# Patient Record
Sex: Male | Born: 1935 | Race: White | Hispanic: No | Smoking: Never smoker
Health system: Southern US, Community
[De-identification: ages and names within clinical notes are randomized; demographics above are authoritative.]

## PROBLEM LIST (undated history)

## (undated) DIAGNOSIS — J189 Pneumonia, unspecified organism: Secondary | ICD-10-CM

## (undated) DIAGNOSIS — I4891 Unspecified atrial fibrillation: Secondary | ICD-10-CM

## (undated) DIAGNOSIS — C61 Malignant neoplasm of prostate: Secondary | ICD-10-CM

## (undated) DIAGNOSIS — I8392 Asymptomatic varicose veins of left lower extremity: Secondary | ICD-10-CM

## (undated) DIAGNOSIS — I499 Cardiac arrhythmia, unspecified: Secondary | ICD-10-CM

## (undated) DIAGNOSIS — R0600 Dyspnea, unspecified: Secondary | ICD-10-CM

## (undated) DIAGNOSIS — R32 Unspecified urinary incontinence: Secondary | ICD-10-CM

## (undated) DIAGNOSIS — E291 Testicular hypofunction: Secondary | ICD-10-CM

## (undated) DIAGNOSIS — I509 Heart failure, unspecified: Secondary | ICD-10-CM

## (undated) DIAGNOSIS — A419 Sepsis, unspecified organism: Secondary | ICD-10-CM

## (undated) DIAGNOSIS — L0390 Cellulitis, unspecified: Secondary | ICD-10-CM

## (undated) DIAGNOSIS — M1990 Unspecified osteoarthritis, unspecified site: Secondary | ICD-10-CM

## (undated) DIAGNOSIS — K219 Gastro-esophageal reflux disease without esophagitis: Secondary | ICD-10-CM

## (undated) DIAGNOSIS — Z87442 Personal history of urinary calculi: Secondary | ICD-10-CM

## (undated) HISTORY — PX: COLONOSCOPY: SHX174

## (undated) HISTORY — DX: Unspecified atrial fibrillation: I48.91

## (undated) HISTORY — PX: KYPHOPLASTY: SHX5884

## (undated) HISTORY — DX: Malignant neoplasm of prostate: C61

## (undated) HISTORY — PX: LAMINECTOMY: SHX219

## (undated) HISTORY — PX: HERNIA REPAIR: SHX51

## (undated) HISTORY — PX: OTHER SURGICAL HISTORY: SHX169

## (undated) HISTORY — PX: TOTAL KNEE ARTHROPLASTY: SHX125

## (undated) HISTORY — PX: ROTATOR CUFF REPAIR: SHX139

## (undated) HISTORY — DX: Unspecified osteoarthritis, unspecified site: M19.90

## (undated) HISTORY — DX: Unspecified urinary incontinence: R32

## (undated) HISTORY — PX: PROSTATE SURGERY: SHX751

## (undated) HISTORY — PX: APPENDECTOMY: SHX54

---

## 1998-06-19 ENCOUNTER — Encounter: Payer: Self-pay | Admitting: Orthopedic Surgery

## 1998-06-26 ENCOUNTER — Inpatient Hospital Stay (HOSPITAL_COMMUNITY): Admission: RE | Admit: 1998-06-26 | Discharge: 1998-06-30 | Payer: Self-pay | Admitting: Orthopedic Surgery

## 1999-07-31 ENCOUNTER — Encounter: Payer: Self-pay | Admitting: Orthopedic Surgery

## 1999-08-01 ENCOUNTER — Observation Stay (HOSPITAL_COMMUNITY): Admission: RE | Admit: 1999-08-01 | Discharge: 1999-08-02 | Payer: Self-pay | Admitting: Orthopedic Surgery

## 2002-02-03 ENCOUNTER — Encounter: Payer: Self-pay | Admitting: Orthopedic Surgery

## 2002-02-03 ENCOUNTER — Ambulatory Visit (HOSPITAL_COMMUNITY): Admission: RE | Admit: 2002-02-03 | Discharge: 2002-02-03 | Payer: Self-pay | Admitting: Radiology

## 2002-02-03 ENCOUNTER — Encounter: Admission: RE | Admit: 2002-02-03 | Discharge: 2002-02-03 | Payer: Self-pay | Admitting: Orthopedic Surgery

## 2002-02-12 ENCOUNTER — Ambulatory Visit (HOSPITAL_COMMUNITY): Admission: RE | Admit: 2002-02-12 | Discharge: 2002-02-12 | Payer: Self-pay | Admitting: Radiology

## 2002-02-12 ENCOUNTER — Encounter: Admission: RE | Admit: 2002-02-12 | Discharge: 2002-02-12 | Payer: Self-pay | Admitting: Radiology

## 2002-02-12 ENCOUNTER — Encounter: Payer: Self-pay | Admitting: Radiology

## 2002-02-24 ENCOUNTER — Ambulatory Visit (HOSPITAL_COMMUNITY): Admission: RE | Admit: 2002-02-24 | Discharge: 2002-02-24 | Payer: Self-pay | Admitting: Radiology

## 2002-02-24 ENCOUNTER — Encounter: Admission: RE | Admit: 2002-02-24 | Discharge: 2002-02-24 | Payer: Self-pay | Admitting: Family Medicine

## 2002-02-24 ENCOUNTER — Encounter: Payer: Self-pay | Admitting: Family Medicine

## 2002-03-11 ENCOUNTER — Inpatient Hospital Stay (HOSPITAL_COMMUNITY): Admission: RE | Admit: 2002-03-11 | Discharge: 2002-03-12 | Payer: Self-pay | Admitting: Neurosurgery

## 2002-03-11 ENCOUNTER — Encounter: Payer: Self-pay | Admitting: Neurosurgery

## 2003-05-09 ENCOUNTER — Ambulatory Visit (HOSPITAL_COMMUNITY): Admission: RE | Admit: 2003-05-09 | Discharge: 2003-05-09 | Payer: Self-pay | Admitting: Orthopedic Surgery

## 2003-05-09 ENCOUNTER — Encounter: Payer: Self-pay | Admitting: Orthopedic Surgery

## 2011-05-24 DIAGNOSIS — E291 Testicular hypofunction: Secondary | ICD-10-CM

## 2011-05-24 HISTORY — DX: Testicular hypofunction: E29.1

## 2011-06-07 ENCOUNTER — Other Ambulatory Visit (INDEPENDENT_AMBULATORY_CARE_PROVIDER_SITE_OTHER): Payer: BC Managed Care – PPO

## 2011-06-07 ENCOUNTER — Ambulatory Visit (INDEPENDENT_AMBULATORY_CARE_PROVIDER_SITE_OTHER)
Admission: RE | Admit: 2011-06-07 | Discharge: 2011-06-07 | Disposition: A | Discharge status: Home / Self Care | Payer: BC Managed Care – PPO | Source: Ambulatory Visit | Attending: Internal Medicine | Admitting: Internal Medicine

## 2011-06-07 ENCOUNTER — Encounter: Payer: Self-pay | Admitting: Internal Medicine

## 2011-06-07 ENCOUNTER — Ambulatory Visit (INDEPENDENT_AMBULATORY_CARE_PROVIDER_SITE_OTHER): Payer: BC Managed Care – PPO | Admitting: Internal Medicine

## 2011-06-07 DIAGNOSIS — Z79899 Other long term (current) drug therapy: Secondary | ICD-10-CM

## 2011-06-07 DIAGNOSIS — R0609 Other forms of dyspnea: Secondary | ICD-10-CM

## 2011-06-07 DIAGNOSIS — R5383 Other fatigue: Secondary | ICD-10-CM

## 2011-06-07 DIAGNOSIS — R0600 Dyspnea, unspecified: Secondary | ICD-10-CM

## 2011-06-07 DIAGNOSIS — H02403 Unspecified ptosis of bilateral eyelids: Secondary | ICD-10-CM

## 2011-06-07 DIAGNOSIS — H02409 Unspecified ptosis of unspecified eyelid: Secondary | ICD-10-CM

## 2011-06-07 DIAGNOSIS — R531 Weakness: Secondary | ICD-10-CM

## 2011-06-07 DIAGNOSIS — R471 Dysarthria and anarthria: Secondary | ICD-10-CM

## 2011-06-07 DIAGNOSIS — R0989 Other specified symptoms and signs involving the circulatory and respiratory systems: Secondary | ICD-10-CM

## 2011-06-07 DIAGNOSIS — I482 Chronic atrial fibrillation, unspecified: Secondary | ICD-10-CM | POA: Insufficient documentation

## 2011-06-07 DIAGNOSIS — R5381 Other malaise: Secondary | ICD-10-CM

## 2011-06-07 LAB — BASIC METABOLIC PANEL
BUN: 19 mg/dL (ref 6–23)
CO2: 25 mEq/L (ref 19–32)
Calcium: 9.1 mg/dL (ref 8.4–10.5)
Chloride: 109 mEq/L (ref 96–112)
Creatinine, Ser: 1.2 mg/dL (ref 0.4–1.5)
Glucose, Bld: 97 mg/dL (ref 70–99)

## 2011-06-07 LAB — HEPATIC FUNCTION PANEL
Albumin: 4 g/dL (ref 3.5–5.2)
Alkaline Phosphatase: 54 U/L (ref 39–117)
Bilirubin, Direct: 0.1 mg/dL (ref 0.0–0.3)
Total Protein: 6.9 g/dL (ref 6.0–8.3)

## 2011-06-07 LAB — CBC WITH DIFFERENTIAL/PLATELET
Basophils Absolute: 0 10*3/uL (ref 0.0–0.1)
Basophils Relative: 0.6 % (ref 0.0–3.0)
Eosinophils Absolute: 0.1 10*3/uL (ref 0.0–0.7)
Lymphocytes Relative: 18.4 % (ref 12.0–46.0)
MCHC: 34.3 g/dL (ref 30.0–36.0)
MCV: 98.7 fl (ref 78.0–100.0)
Monocytes Absolute: 0.5 10*3/uL (ref 0.1–1.0)
Neutrophils Relative %: 72.3 % (ref 43.0–77.0)
RBC: 3.99 Mil/uL — ABNORMAL LOW (ref 4.22–5.81)
RDW: 13.6 % (ref 11.5–14.6)

## 2011-06-07 LAB — TSH: TSH: 1.42 u[IU]/mL (ref 0.35–5.50)

## 2011-06-07 LAB — PROTIME-INR: INR: 2.1 ratio — ABNORMAL HIGH (ref 0.8–1.0)

## 2011-06-07 NOTE — Progress Notes (Signed)
  Subjective:    Patient ID: Richard Bradley, male    DOB: [birth year], 77 y.o.   MRN: [id number]  HPI  New patient to me and our practice here today to establish care  Complains of drooping eyelids and double vision Onset 2 weeks ago Family reports generalized weakness and fatigability for the last 12 months, accelerated progression in the last 2 months Symptoms worsened today, symptoms improved with rest and sleep Associated with dysarthria and jumbled speech, worse when tired On chronic Coumadin but denies any falls or recent head trauma No unilateral weakness No prior history of stroke Intermittent dyspnea over past 12 months status post thorough cardiac evaluation and hometown [place] negative cardiac catheterization in December 2011 reviewed Symptoms seem worse when taking Megace, stopped same 3 days ago  Past Medical History  Diagnosis Date  . Kidney stones   . Urine incontinence   . Prostate cancer   . Atrial fibrillation   . Osteoarthritis      Review of Systems  Constitutional: Negative for fever and unexpected weight change.  HENT: Positive for hearing loss. Negative for drooling and tinnitus.   Respiratory: Negative for cough and chest tightness.   Cardiovascular: Negative for chest pain and leg swelling.  Neurological: Negative for dizziness and numbness.  Psychiatric/Behavioral: Positive for decreased concentration. Negative for behavioral problems and confusion. The patient is not nervous/anxious.        Objective:   Physical Exam BP 110/72  Pulse 86  Temp(Src) 97.6 F (36.4 C) (Oral)  Ht 6\' 3"  (1.905 m)  Wt 192 lb (87.091 kg)  BMI 24.00 kg/m2  SpO2 97% Constitutional:  He appears well-developed and well-nourished. No distress. Dtr at side. Eyes: mild-mod R>L upper lid ptosis, EOMI but slow tracking. PERRL Vision grossly intact. No conjunctivitis or icterus. Neck: Normal range of motion. Neck supple. No JVD present. No thyromegaly  present.  Cardiovascular: Normal rate, irregular rhythm and normal heart sounds.  No murmur heard. no BLE edema Pulmonary/Chest: Effort normal and breath sounds normal. No respiratory distress. no wheezes.  Abdominal: Soft. Bowel sounds are normal. Patient exhibits no distension. There is no tenderness.  Musculoskeletal: Normal range of motion. Patient exhibits no edema.  Neurological: he is alert and oriented to person, place, and time. No cranial nerve deficit. Coordination normal.  normal reflexes upper and lower extremities, normal strength upper and lower extremities. Skin: Skin is warm and dry.  No erythema or ulceration.  Psychiatric: he has a normal mood and affect. behavior is normal. Judgment and thought content normal.   No results found for this basename: WBC, HGB, HCT, PLT, GLUCOSE, CHOL, TRIG, HDL, LDLDIRECT, LDLCALC, ALT, AST, NA, K, CL, CREATININE, BUN, CO2, TSH, PSA, INR, GLUF, HGBA1C, MICROALBUR       Assessment & Plan:  Ptosis of eyelids, worse end of day Dyspnea, intermittent Generalized weakness Dysarthria, worse end of the day Chronic A. fib on anticoagulation Prostate cancer hx on Megace  Clinically concerned for chronic subdural or myasthenia gravis Check labs including acetylcholine receptor antibody today Check head CT without contrast today>>> addendum: Report reviewed, no subdural. No intracranial bleed. Small vessel changes, no mass. Consider referral to neuro as needed  Hold Megace

## 2011-06-07 NOTE — Patient Instructions (Signed)
It was good to see you today. Continue to hold on megace for now Test(s) ordered today. Your results will be called to you after review (48-72hours after test completion). If any changes need to be made, you will be notified at that time. we'll make referral for scan of your head today. Our office will contact you regarding appointment(s) once made.

## 2011-06-09 LAB — ACETYLCHOLINE RECEPTOR, BINDING: A CHR BINDING ABS: <0.3 nmol/L (ref <=0.30)

## 2011-06-11 ENCOUNTER — Other Ambulatory Visit: Payer: Self-pay | Admitting: General Practice

## 2011-06-13 ENCOUNTER — Encounter: Payer: Self-pay | Admitting: Neurology

## 2011-06-13 ENCOUNTER — Ambulatory Visit (INDEPENDENT_AMBULATORY_CARE_PROVIDER_SITE_OTHER): Payer: BC Managed Care – PPO | Admitting: Neurology

## 2011-06-13 ENCOUNTER — Other Ambulatory Visit: Payer: BC Managed Care – PPO

## 2011-06-13 DIAGNOSIS — G7000 Myasthenia gravis without (acute) exacerbation: Secondary | ICD-10-CM

## 2011-06-13 MED ORDER — PYRIDOSTIGMINE BROMIDE 60 MG PO TABS: ORAL_TABLET | ORAL | Status: DC

## 2011-06-13 NOTE — Progress Notes (Signed)
Dear Dr. Felicity Coyer,  Thank you for having me see Richard Bradley in consultation today at [facility] for his problem with droopy eye lids, blurry vision, and dysarthria.  As you may recall, he is a 77 y.o. year old male with a history of prostate cancer and atrial fibrillation who presents with a year history of fluctuating eye lid drooping, difficulty breathing and recent onset of double vision.  He notes it improves when he lies down but gets worse near the end of the day.  He is noted to have dysarthria in the evening.  He denies appendicular weakness.  His breathing is not necessarily worse lying down.  He has noticed some urinary incontinence, only when standing.  Past Medical History  Diagnosis Date  . Kidney stones   . Urine incontinence   . Prostate cancer   . Atrial fibrillation   . Osteoarthritis     Past Surgical History  Procedure Date  . Prostate surgery   . Hernia repair   . Total knee arthroplasty   . Rotator cuff repair     History   Social History  . Marital Status: Married    Spouse Name: N/A    Number of Children: N/A  . Years of Education: N/A   Social History Main Topics  . Smoking status: Never Smoker   . Smokeless tobacco: Never Used  . Alcohol Use: No  . Drug Use: No  . Sexually Active: None   Other Topics Concern  . None   Social History Narrative  . None    Family History:  No history of neurologic disorders   ROS:  13 systems were reviewed and are notable for urinary incontinence when standing.  All other review of systems are unremarkable.   Examination:  Filed Vitals:   06/13/11 1120  BP: 108/78  Pulse: 92  Weight: 193 lb (87.544 kg)     In general, well appearing older man.  Cardiovascular: The patient has a irregularly irregular rhythm and no carotid bruits.  Respiratory:  can count to 18 on one breath which is impaired.  Fundoscopy:  Disks are flat. Vessel caliber within normal limits.  Mental status:   The  patient is oriented to person, place and time. Recent and remote memory are intact. Attention span and concentration are normal. Language including repetition, naming, following commands are intact. Fund of knowledge of current and historical events, as well as vocabulary are normal.  Cranial Nerves: Pupils are equally round and reactive to light. Visual fields full to confrontation. EOMs appear full at this time, with out dysconjugacy, and no double vsion.  Obvious ptosis, worse on right.  Better with application of ice pack for 1 minute. Facial sensation and muscles of mastication are intact. Muscles of facial expression are symmetric.  There may be some weakness of the tongue on deviation to the right. Hearing intact to bilateral finger rub. Tongue protrusion, uvula, palate midline.  Shoulder shrug intact.  Motor:  Some fatiguable weakness at the shoulders bilaterally.  I would rate his strength otherwise 5-.  Reflexes:   Biceps  Triceps Brachioradialis Knee Ankle  Right 2+  2+  2+   2+ 0  Left  2+  2+  2+   2+ 0  Toes down  Coordination:  Normal finger to nose.    Sensation is decreased to temperature in the feet.  Gait and Station are normal.   Romberg is negative  AchR Binding Ab was negative as ordered by  Dr. Felicity Coyer.  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  I will get an EMG/NCS with RNS, and a CT of the chest for a thymoma.  I am going to send of AchR modulating ab as well as MUSK ab.  However, I would not doubt these are negative.  I have instructed the patient to start pyridostigmine 30mg  tid to increase to 60mg  tid.  He is to stop this two days before his EMG/NCS.  He may need steroids or CellCept if the pyridostigmine does not help.    We will see the patient back in 2 weeks.  Thank you for having [place] see Richard Gitelman Evilsizer in consultation.  Feel free to contact me with any questions.  Lupita Raider Modesto Charon, MD [facility], [place] [address] [place], [place] [address] Phone: [phone] Fax: [phone].

## 2011-06-13 NOTE — Patient Instructions (Addendum)
Go to the basement to have your labs drawn today.  Your CT scan is scheduled for Tuesday, November 20tha t 2:00pm.  Please arrive to [facility] by 1:45pm.  Nothing to eat or drink after 10:00am except for water.  Your appointment for the nerve conduction studies/electromyelogram is scheduled for Monday, December 3rd at 8:30am at  [facility] [address] [place] [phone].  Your follow up with Dr. Modesto Charon is Thursday, November 29th at 11:30am.

## 2011-06-18 ENCOUNTER — Ambulatory Visit (HOSPITAL_COMMUNITY)
Admission: RE | Admit: 2011-06-18 | Discharge: 2011-06-18 | Disposition: A | Discharge status: Home / Self Care | Payer: BC Managed Care – PPO | Source: Ambulatory Visit | Attending: Neurology | Admitting: Neurology

## 2011-06-18 ENCOUNTER — Encounter (HOSPITAL_COMMUNITY): Payer: Self-pay

## 2011-06-18 DIAGNOSIS — I700 Atherosclerosis of aorta: Secondary | ICD-10-CM | POA: Insufficient documentation

## 2011-06-18 DIAGNOSIS — K7689 Other specified diseases of liver: Secondary | ICD-10-CM | POA: Insufficient documentation

## 2011-06-18 DIAGNOSIS — G7000 Myasthenia gravis without (acute) exacerbation: Secondary | ICD-10-CM | POA: Insufficient documentation

## 2011-06-18 DIAGNOSIS — I2510 Atherosclerotic heart disease of native coronary artery without angina pectoris: Secondary | ICD-10-CM | POA: Insufficient documentation

## 2011-06-18 DIAGNOSIS — I319 Disease of pericardium, unspecified: Secondary | ICD-10-CM | POA: Insufficient documentation

## 2011-06-18 DIAGNOSIS — N200 Calculus of kidney: Secondary | ICD-10-CM | POA: Insufficient documentation

## 2011-06-18 DIAGNOSIS — C61 Malignant neoplasm of prostate: Secondary | ICD-10-CM | POA: Insufficient documentation

## 2011-06-18 MED ORDER — IOHEXOL 300 MG/ML  SOLN
80.0000 mL | Freq: Once | INTRAMUSCULAR | Status: AC | PRN
  Administered 2011-06-18: 80 mL via INTRAVENOUS

## 2011-06-19 ENCOUNTER — Ambulatory Visit: Payer: BC Managed Care – PPO | Admitting: Neurology

## 2011-06-27 ENCOUNTER — Encounter: Payer: Self-pay | Admitting: Neurology

## 2011-06-27 ENCOUNTER — Ambulatory Visit (INDEPENDENT_AMBULATORY_CARE_PROVIDER_SITE_OTHER): Payer: BC Managed Care – PPO | Admitting: Neurology

## 2011-06-27 DIAGNOSIS — G7000 Myasthenia gravis without (acute) exacerbation: Secondary | ICD-10-CM | POA: Insufficient documentation

## 2011-06-27 MED ORDER — PYRIDOSTIGMINE BROMIDE 60 MG PO TABS: ORAL_TABLET | ORAL | Status: DC

## 2011-06-27 NOTE — Patient Instructions (Signed)
Your prescription has been sent to [facility].  We will see you back on January 2nd at 11:30am.  Have a great Christmas and New Year!!

## 2011-06-27 NOTE — Progress Notes (Signed)
Dear Dr. Felicity Coyer,  I saw  Richard Bradley back in [facility] for his problem with ocularbulbar myasthenia gravis.  As you may recall, he is a 77 y.o. year old male with a history of atrial fibrillation and prostate cancer who presents with a year history of ptosis, diplopia, dysarthria and difficulty breathing.  You felt he had myasthenia gravis and clinically I agreed.  I ordered an AchR modulating ab as his binding ab was negative.  It came back positive confirming the diagnosis.    I started him on Mestinon 60 tid.  He feels it has helped the diplopia somewhat but he still is having problems with ptosis, dysarthria in the evenings and shortness of breath.   He denies appendicular weakness.  He does have some excessive salivation, but it is hard to tell if this is secondary to the Mestinon in terms of the timing.  He thinks it got worse when he stopped his PPI.   Medical history, social history, and family history were reviewed and have not changed since the last clinic visit.  Current Outpatient Prescriptions on File Prior to Visit  Medication Sig Dispense Refill  . metoprolol (LOPRESSOR) 50 MG tablet Take 25 mg by mouth 3 (three) times daily.        Marland Kitchen omeprazole (PRILOSEC) 20 MG capsule Take 20 mg by mouth daily.        Marland Kitchen warfarin (COUMADIN) 5 MG tablet Take 5 mg by mouth as directed.        . megestrol (MEGACE) 20 MG tablet Take 20 mg by mouth 2 (two) times daily.          No Known Allergies  ROS:  13 systems were reviewed and are notable for excessive salivation, no diarrhea, no abdo pain.  All other review of systems are unremarkable.  Exam: . Filed Vitals:   06/27/11 1116  BP: 118/78  Pulse: 82  Weight: 153 lb 14.4 oz (69.809 kg)    Cranial Nerves: Clear fatiguable ptosis.  Fatiguable disconjugate gaze.  Mild facial and tongue weakness.  Neck flexion, neck ext normal.    Motor:  No appendicular weakness.  CT Chest: no thymoma.   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    Recommendations: I am going to increase his Mestinon 60 tid to 120 tid.  If he does not get appropriate results from this I will start him on 5mg  daily of prednisone to increase 5 qweek to 30.  I will see him back in 4 weeks.  Lupita Raider Modesto Charon, MD [facility], [place]

## 2011-07-11 ENCOUNTER — Telehealth: Payer: Self-pay | Admitting: Neurology

## 2011-07-11 ENCOUNTER — Other Ambulatory Visit: Payer: Self-pay | Admitting: Neurology

## 2011-07-11 MED ORDER — PREDNISONE 5 MG PO TABS: ORAL_TABLET | ORAL | Status: DC

## 2011-07-11 NOTE — Telephone Encounter (Signed)
Called in the med to [facility]. Spoke with Dennie Bible. Called in as directed below. "Jan - could you please call in 5mg  prednisone tabs dispense 180 tabs to the [facility].  The sig should be "start with 5mg  daily, increase every week by 5mg  a day, to 30mg  a day after 6 weeks"  3 refills  I have talked to daughter with instructions."

## 2011-07-11 NOTE — Telephone Encounter (Signed)
Pt is done with titration schedule of Mestinon but isn't feeling much better. Pt's daughter states that you discussed starting Prednisone if Mestinon isn't working so he would like to try this. When he starts the Pred, should he continue taking the other med (and how much) or does he stop the other med altogether? Pt is also back at home now so the meds should be sent in to the [facility], fax number [phone]. Pt's daughter wasn't sure if this pharmacy accepted e-prescribe.

## 2011-07-11 NOTE — Telephone Encounter (Signed)
Jan - could you please call in 5mg  prednisone tabs dispense 180 tabs to the [facility].  The sig should be "start with 5mg  daily, increase every week by 5mg  a day, to 30mg  a day after 6 weeks"  3 refills  I have talked to daughter with instructions.

## 2011-07-12 ENCOUNTER — Encounter: Payer: Self-pay | Admitting: Neurology

## 2011-07-16 ENCOUNTER — Telehealth: Payer: Self-pay | Admitting: Neurology

## 2011-07-16 NOTE — Telephone Encounter (Signed)
Patient left a voicemail message. "I'm having trouble with my vision. Every morning when I get up my vision is worse. It was so bad this morning that I had to cancel an appointment. I am concerned and need to know if I need to back off of some of my medicines? I started the Prednisone on Friday. I just need some direction. If I should do nothing then I need to know that too. My number is [phone]. I look forward to talking to you soon."

## 2011-07-16 NOTE — Telephone Encounter (Signed)
spoke to patient.  Having transient worsening of vision(ptosis) on prednisone.  No other worsening of swallowing, breathing, etc.  I told him to increase the prednisone to 10mg  as scheduled and let me know if he continues to worsen.  If he develops breathing or speaking problems we may have to bring him into hospital.

## 2011-07-16 NOTE — Telephone Encounter (Signed)
Called and spoke with the patient. He states that his vision is getting worse every day and he is concerned. He is taking the Prednisone 5 mg daily (started last Friday) and will increase to 10 mg this coming Friday. He continues the Mestinon. He had to cancel appointments yesterday for today due to his vision. States he can still read the paper but is difficult. He is also anxious about driving. I asked that he not put himself in harm's way by driving. He states he won't. The patient is concerned and just wants to know what to do now. I told him I would let Dr. Modesto Charon know that he was having problems. **Dr. Modesto Charon please advise. Thank you.

## 2011-07-31 ENCOUNTER — Ambulatory Visit (INDEPENDENT_AMBULATORY_CARE_PROVIDER_SITE_OTHER): Payer: BC Managed Care – PPO | Admitting: Neurology

## 2011-07-31 ENCOUNTER — Encounter: Payer: Self-pay | Admitting: Neurology

## 2011-07-31 DIAGNOSIS — G7000 Myasthenia gravis without (acute) exacerbation: Secondary | ICD-10-CM

## 2011-07-31 NOTE — Progress Notes (Signed)
Dear Dr. Felicity Bradley,  I saw  Richard Bradley back in [name] clinic for his problem with sero-positive ocularbulbar myasthenia gravis.  As you may recall, he is a 78 y.o. year old male with a history of atrial fibrillation and prostate cancer who presented with a year history of ptosis, diplopia and difficulty breathing.    His AchR mod ab was positive, binding ab was negative.  I started him initially on mestinon and increased it to 120 tid.  He got some results but did not feel an adequate improvement.  I then started him on a slowly increasing dose of prednisone at a dose increase of 5mg  per week.  He is currently on 15mg  a day.  My target is 30mg  daily.  He thinks that he has had worsening of his condition, with worse difficulty breathing and difficulty seeing and chewing.  His ptosis interestingly has gotten better though.  He says he has had two episodes where he can't move his hands.   Medical history, social history, and family history were reviewed and have not changed since the last clinic visit.  Current Outpatient Prescriptions on File Prior to Visit  Medication Sig Dispense Refill  . megestrol (MEGACE) 20 MG tablet Take 20 mg by mouth 2 (two) times daily.       . metoprolol (LOPRESSOR) 50 MG tablet Take 25 mg by mouth 3 (three) times daily.       Marland Kitchen omeprazole (PRILOSEC) 20 MG capsule Take 20 mg by mouth daily.       . predniSONE (DELTASONE) 5 MG tablet Maintenance dose after titration will be 30 mg per day.  180 tablet  3  . pyridostigmine (MESTINON) 60 MG tablet start with 1 1/2 three times a day for 1 week, then 2 three times a day from then on.  180 tablet  11  . warfarin (COUMADIN) 5 MG tablet Take 5 mg by mouth as directed.          No Known Allergies  ROS:  13 systems were reviewed and are notable for blurriness of vision.  All other review of systems are unremarkable.  Exam: . Filed Vitals:   07/31/11 1145  BP: 108/68  Pulse: 76  Weight: 189 lb (85.73 kg)     In general, well appearing older man.  Cranial Nerves: Pupils are equally round and reactive to light.  Extraocular movements reveal fatiguable upgaze.  He has a right worse than left fatiguable ptosis.    Strong eye closure.  Cannot puff out cheeks.  Good palatal elevation. Good neck flexion and neck extension strength.  SS strong.  Counts to 20 on one breath.  Motor:  ?mild appendicular 5- weakness proximally.  Reflexes:  2+ thoughout, toes down.  Coordination:  Normal finger to nose  Gait:  Normal gait and station.  Romberg negative.  Impression/Recs:  Sero-positive ocularbulbar myasthenia, stable.  I am of course worried about his reports of worsening.  I wonder if this is because of the initial exacerbation that one can see with steroids.  I have offered him the option of PLEX to see if we can jump start his progress.  I do think he is stable enough from a respiratory standpoint to be at home, however if he worsens I would PLEX him emergently.  For now we can set it up as a elective admission.  He will call me when he is ready to be admitted.   Richard Raider Modesto Charon, MD [facility], [place]

## 2011-07-31 NOTE — Patient Instructions (Signed)
We will see you back on Friday,  Feb 15th at 10:00am.

## 2011-08-01 ENCOUNTER — Other Ambulatory Visit: Payer: Self-pay | Admitting: Neurology

## 2011-08-01 MED ORDER — PYRIDOSTIGMINE BROMIDE 60 MG PO TABS: ORAL_TABLET | ORAL | Status: DC

## 2011-08-01 NOTE — Telephone Encounter (Signed)
Pt would like [place] to fax his rx for mestinon to [facility] at fax # [phone] ASAP.

## 2011-08-01 NOTE — Telephone Encounter (Signed)
Called and faxed to [facility] pharmacy by Jan.

## 2011-08-06 ENCOUNTER — Telehealth: Payer: Self-pay | Admitting: Neurology

## 2011-08-06 NOTE — Telephone Encounter (Signed)
Pt is still in hospital on IVIG. They decreased his prednisone to 15 mg and plan to step it down further. Pt is still on O2. Daughter anticipates pt will be discharged later this week. Should they keep their regular fu appt with you on 09/13/2011 or do we need to work them in some time next week?

## 2011-08-06 NOTE — Telephone Encounter (Signed)
yes we need to fit him in next week.  say noon Friday.

## 2011-08-07 NOTE — Telephone Encounter (Signed)
Pt scheduled for Friday 08/16/2011 at noon.

## 2011-08-09 ENCOUNTER — Telehealth: Payer: Self-pay | Admitting: Neurology

## 2011-08-09 NOTE — Telephone Encounter (Signed)
Pt has fu appt scheduled 08/16/2011. Pt was previously advised to taper down medication from 20 mg to 15 mg, but upon discharge from hospital in [place], physician reccommended he return to 20 mg. Pt's daughter wanted to make sure that this is what you suggest as well.

## 2011-08-09 NOTE — Telephone Encounter (Signed)
spoke to daughter.  hold the pred at 65 for now.

## 2011-08-16 ENCOUNTER — Encounter: Payer: Self-pay | Admitting: Neurology

## 2011-08-16 ENCOUNTER — Ambulatory Visit (INDEPENDENT_AMBULATORY_CARE_PROVIDER_SITE_OTHER): Payer: BC Managed Care – PPO | Admitting: Neurology

## 2011-08-16 DIAGNOSIS — G7000 Myasthenia gravis without (acute) exacerbation: Secondary | ICD-10-CM

## 2011-08-16 NOTE — Progress Notes (Signed)
Dear Dr. Felicity Coyer,  Thank you for having me see Richard Bradley in consultation today at [facility] for his problem with seropositive myasthenia gravis.  As you may recall, he is a 78 y.o. year old male with a years difficulty with ptosis, double vision, difficulty speaking and swallowing as well as breathing who you diagnosed with myasthenia gravis and we confirmed with AchR modulating ab positivity.  I placed him on prednisone, but as I feared he had interval worsening of his illness so that when I saw him last two weeks ago he was having mild difficulty breathing.  He also had an URTI.  I elected to continue to follow him as an outpatient, but due to worsening of his breathing he presented to the medical center in [place].  They felt he had an MG exacerbation and gave him five days of IVIG.  They also treated him for a bronchitis.  They also backed off his prednisone from 20 to 15 mg a day.  Since discharge he has been doing much better.  He had three days of feeling "almost perfect" although his diplopia and ptosis as well as chewing is worse today as he didn't sleep well at night.  He denies appendicular weakness.  He also continues on Mestinon 120 tid.  Past Medical History  Diagnosis Date  . Kidney stones   . Urine incontinence   . Atrial fibrillation   . Osteoarthritis   . Prostate cancer dx'd 1982  . Myasthenia gravis     Past Surgical History  Procedure Date  . Prostate surgery   . Hernia repair   . Total knee arthroplasty   . Rotator cuff repair     History   Social History  . Marital Status: Married    Spouse Name: N/A    Number of Children: N/A  . Years of Education: N/A   Social History Main Topics  . Smoking status: Never Smoker   . Smokeless tobacco: Never Used  . Alcohol Use: No  . Drug Use: No  . Sexually Active: None   Other Topics Concern  . None   Social History Narrative  . None    No family history on file.  Current Outpatient  Prescriptions on File Prior to Visit  Medication Sig Dispense Refill  . metoprolol (LOPRESSOR) 50 MG tablet Take 25 mg by mouth 3 (three) times daily.       Marland Kitchen omeprazole (PRILOSEC) 20 MG capsule Take 20 mg by mouth daily.       . predniSONE (DELTASONE) 5 MG tablet Maintenance dose after titration will be 30 mg per day.  180 tablet  3  . pyridostigmine (MESTINON) 60 MG tablet start with 1 1/2 three times a day for 1 week, then 2 three times a day from then on.  180 tablet  11  . warfarin (COUMADIN) 5 MG tablet Take 5 mg by mouth as directed.        . megestrol (MEGACE) 20 MG tablet Take 20 mg by mouth 2 (two) times daily.         No Known Allergies    ROS:  13 systems were reviewed and are notable for improved mood.  All other review of systems are unremarkable.   Examination:  Filed Vitals:   08/16/11 1158  BP: 96/68  Pulse: 88  Weight: 189 lb (85.73 kg)     In general, well appearing older man.    Mental status:   The patient is oriented  to person, place and time. Recent and remote memory are intact. Attention span and concentration are normal. Language including repetition, naming, following commands are intact. Fund of knowledge of current and historical events, as well as vocabulary are normal.  Cranial Nerves: Mild right sided ptosis, fatiguable.  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  Mild weakness of lower face - puffing out cheeks, as well as tongue.  Mild neck flexion weakness.  Motor:  Normal strength.    Gait and Station are normal.    Impression: Sero positive ocular bulbar MG, who has responded to IVIG. I am going to continue mestinon at current dose, but continue titration of prednisone to 30mg  daily.  I suspect he will be able to come off oxygen.  Use of oxygen is probably secondary to cardiac/pulmonary issues rather than MG, but as MG improves this will help [place] well.   We will see the patient back in 6 weeks.  Thank you for having [place] see Richard Bradley in  consultation.  Feel free to contact me with any questions.  Lupita Raider Modesto Charon, MD [facility], [place] [address] [place], [place] [address] Phone: [phone] Fax: [phone].

## 2011-09-13 ENCOUNTER — Ambulatory Visit: Payer: BC Managed Care – PPO | Admitting: Neurology

## 2011-09-26 ENCOUNTER — Ambulatory Visit (INDEPENDENT_AMBULATORY_CARE_PROVIDER_SITE_OTHER): Payer: BC Managed Care – PPO | Admitting: Neurology

## 2011-09-26 ENCOUNTER — Other Ambulatory Visit: Payer: Self-pay | Admitting: Neurology

## 2011-09-26 ENCOUNTER — Other Ambulatory Visit (INDEPENDENT_AMBULATORY_CARE_PROVIDER_SITE_OTHER): Payer: BC Managed Care – PPO

## 2011-09-26 ENCOUNTER — Encounter: Payer: Self-pay | Admitting: Neurology

## 2011-09-26 DIAGNOSIS — G609 Hereditary and idiopathic neuropathy, unspecified: Secondary | ICD-10-CM

## 2011-09-26 DIAGNOSIS — R7309 Other abnormal glucose: Secondary | ICD-10-CM

## 2011-09-26 LAB — COMPREHENSIVE METABOLIC PANEL
Albumin: 4.1 g/dL (ref 3.5–5.2)
Alkaline Phosphatase: 39 U/L (ref 39–117)
BUN: 20 mg/dL (ref 6–23)
CO2: 27 mEq/L (ref 19–32)
GFR: 65.1 mL/min (ref >60.00)
Glucose, Bld: 143 mg/dL — ABNORMAL HIGH (ref 70–99)
Total Bilirubin: 0.6 mg/dL (ref 0.3–1.2)

## 2011-09-26 LAB — CBC WITH DIFFERENTIAL/PLATELET
Basophils Relative: 0.7 % (ref 0.0–3.0)
Eosinophils Relative: 0 % (ref 0.0–5.0)
HCT: 43.3 % (ref 39.0–52.0)
Hemoglobin: 14.3 g/dL (ref 13.0–17.0)
Lymphs Abs: 0.5 10*3/uL — ABNORMAL LOW (ref 0.7–4.0)
MCV: 103.1 fl — ABNORMAL HIGH (ref 78.0–100.0)
Monocytes Absolute: 0.3 10*3/uL (ref 0.1–1.0)
RBC: 4.2 Mil/uL — ABNORMAL LOW (ref 4.22–5.81)
WBC: 9.2 10*3/uL (ref 4.5–10.5)

## 2011-09-26 LAB — SEDIMENTATION RATE: Sed Rate: 8 mm/hr (ref 0–22)

## 2011-09-26 LAB — HEMOGLOBIN A1C: Hgb A1c MFr Bld: 5.6 % (ref 4.6–6.5)

## 2011-09-26 LAB — VITAMIN B12: Vitamin B-12: 245 pg/mL (ref 211–911)

## 2011-09-26 NOTE — Progress Notes (Signed)
Dear Dr. 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  At his last clinic visit he had just finished a course of IVIG.  I continued him on his prednisone titration and he has had improvement, although not as much as he would like.  He is not having any problems swallowing or breathing and he has no double vision.  He still says that he gets tired chewing, and his right eye droops.  He says he can tell when he will have a tired day by how he feels in the a.m. and the condition of his drooping eye.  He is taking 30mg  of prednisone a day.  He does complain of some numbness of his feet.  He has these strange spells where he says he can't move the 3,4,5th finger of his right hand and after prolonged sitting feels like his right foot won't move.  It resolves in minutes.  He is tolerating the prednisone well.  Medical history, social history, and family history were reviewed and have not changed since the last clinic visit.  Current Outpatient Prescriptions on File Prior to Visit  Medication Sig Dispense Refill  . megestrol (MEGACE) 20 MG tablet Take 20 mg by mouth 2 (two) times daily.       . metoprolol (LOPRESSOR) 50 MG tablet Take 25 mg by mouth 3 (three) times daily.       Marland Kitchen omeprazole (PRILOSEC) 20 MG capsule Take 20 mg by mouth daily.       . predniSONE (DELTASONE) 5 MG tablet Maintenance dose after titration will be 30 mg per day.  180 tablet  3  . pyridostigmine (MESTINON) 60 MG tablet start with 1 1/2 three times a day for 1 week, then 2 three times a day from then on.  180 tablet  11  . warfarin (COUMADIN) 5 MG tablet Take 5 mg by mouth as directed.          No Known Allergies  ROS:  13 systems were reviewed and are notable for problems with loss of bowel control and loose stools.  All other review of systems are unremarkable.  Exam: . Filed Vitals:   09/26/11 1521  BP: 136/84  Pulse: 108  Height: 6\' 3"   (1.905 m)  Weight: 193 lb (87.544 kg)    In general, well appearing older man.  Mental status:   The patient is oriented to person, place and time. Recent and remote memory are intact. Attention span and concentration are normal. Language including repetition, naming, following commands are intact. Fund of knowledge of current and historical events, as well as vocabulary are normal.  Cranial Nerves: Pupils are equally round and reactive to light. Fatiguable ptosis on upgaze.  Difficulty burying sclera medially in left eye.  Mild eye closure weakness, and cheek weakness.  Good palatal elevation.  Hearing intact to bilateral finger rub. Neck flexion and extension normal.  Motor:  Normal bulk and tone, no drift and 5/5 muscle strength bilaterally.  Reflexes:  2+ thoughout, toes down, except absent ankles.  Coordination:  Normal finger to nose  Sensation: Length dependent loss of vibration and temp.  Gait:  Normal gait and station.   Impression/Recommendations:  1.  Ocular bulbar MG - moderately controlled.  I am going to consult one of my neuromuscular friends about further management.  I think he may continue to get improved benefit from the prednisone, although we could try to  give him some IVIG to give him a boost. 2.  Loose stool/incontinence of bowel - This may be secondary to the mestinon.  I am not convinced it has helped him so I will have him cut it in half to see if it helps these symptoms.  We will see the patient back in 2 months.  Lupita Raider Modesto Charon, MD [facility], [place]

## 2011-09-26 NOTE — Patient Instructions (Signed)
Go to the basement to have your labs drawn today.  . 

## 2011-09-30 ENCOUNTER — Telehealth: Payer: Self-pay | Admitting: Neurology

## 2011-09-30 MED ORDER — MYCOPHENOLATE MOFETIL 500 MG PO TABS: 1000.0000 mg | ORAL_TABLET | Freq: Two times a day (BID) | ORAL | Status: DC

## 2011-09-30 NOTE — Telephone Encounter (Signed)
Spoke to Babbitt his daughter.    PN labs looked ok.  Has a high MCV, but otherwise unremarkable.   Going to start Cellcept 1000mg  bid for MG.  He will need f/u labs when he returns in 2 months.

## 2011-09-30 NOTE — Progress Notes (Signed)
I am going to add Cellcept 1000mg  bid.

## 2011-10-01 LAB — SPEP & IFE WITH QIG
Alpha-1-Globulin: 4.1 % (ref 2.9–4.9)
Gamma Globulin: 14.8 % (ref 11.1–18.8)
IgM, Serum: 58 mg/dL (ref 41–251)

## 2011-10-03 ENCOUNTER — Ambulatory Visit: Payer: BC Managed Care – PPO | Admitting: Neurology

## 2011-10-03 LAB — METHYLMALONIC ACID, SERUM: Methylmalonic Acid, Quant: 0.43 umol/L — ABNORMAL HIGH (ref <0.40)

## 2011-10-11 ENCOUNTER — Telehealth: Payer: Self-pay | Admitting: Neurology

## 2011-10-11 NOTE — Telephone Encounter (Signed)
Pt's daughter Junious Dresser returned your call. She states that her father is doing well now and she thinks that the issues she asked you about previously can all be discussed at his regularly scheduled fu appt on 11/28/2011.

## 2011-10-11 NOTE — Telephone Encounter (Signed)
ok 

## 2011-11-04 ENCOUNTER — Telehealth: Payer: Self-pay | Admitting: Internal Medicine

## 2011-11-04 NOTE — Telephone Encounter (Signed)
dtr Junious Dresser called - pt now in [place] following hospitalization [facility]) for [facility] and septic shock. Pt and family decided on "NCB" and "DNR" status and would like home forms to give to EMS in future events. Gold DNR form and MOST form completed and given to pt/family as requested

## 2011-11-22 ENCOUNTER — Emergency Department (HOSPITAL_COMMUNITY): Payer: BC Managed Care – PPO

## 2011-11-22 ENCOUNTER — Emergency Department (HOSPITAL_COMMUNITY)
Admission: EM | Admit: 2011-11-22 | Discharge: 2011-11-22 | Disposition: A | Discharge status: Home / Self Care | Payer: BC Managed Care – PPO | Attending: Emergency Medicine | Admitting: Emergency Medicine

## 2011-11-22 ENCOUNTER — Encounter (HOSPITAL_COMMUNITY): Payer: Self-pay | Admitting: Radiology

## 2011-11-22 DIAGNOSIS — W108XXA Fall (on) (from) other stairs and steps, initial encounter: Secondary | ICD-10-CM

## 2011-11-22 DIAGNOSIS — W010XXA Fall on same level from slipping, tripping and stumbling without subsequent striking against object, initial encounter: Secondary | ICD-10-CM | POA: Insufficient documentation

## 2011-11-22 DIAGNOSIS — R0789 Other chest pain: Secondary | ICD-10-CM

## 2011-11-22 DIAGNOSIS — Z7901 Long term (current) use of anticoagulants: Secondary | ICD-10-CM | POA: Insufficient documentation

## 2011-11-22 DIAGNOSIS — G319 Degenerative disease of nervous system, unspecified: Secondary | ICD-10-CM | POA: Insufficient documentation

## 2011-11-22 DIAGNOSIS — R071 Chest pain on breathing: Secondary | ICD-10-CM | POA: Insufficient documentation

## 2011-11-22 DIAGNOSIS — M47812 Spondylosis without myelopathy or radiculopathy, cervical region: Secondary | ICD-10-CM | POA: Insufficient documentation

## 2011-11-22 DIAGNOSIS — Y9301 Activity, walking, marching and hiking: Secondary | ICD-10-CM | POA: Insufficient documentation

## 2011-11-22 DIAGNOSIS — M25539 Pain in unspecified wrist: Secondary | ICD-10-CM | POA: Insufficient documentation

## 2011-11-22 LAB — CBC
Hemoglobin: 13.3 g/dL (ref 13.0–17.0)
MCH: 33.8 pg (ref 26.0–34.0)
MCHC: 33.2 g/dL (ref 30.0–36.0)
MCV: 101.8 fL — ABNORMAL HIGH (ref 78.0–100.0)

## 2011-11-22 LAB — BASIC METABOLIC PANEL
BUN: 19 mg/dL (ref 6–23)
Calcium: 8.6 mg/dL (ref 8.4–10.5)
Creatinine, Ser: 1.07 mg/dL (ref 0.50–1.35)
GFR calc non Af Amer: 62 mL/min — ABNORMAL LOW (ref >90)
Glucose, Bld: 127 mg/dL — ABNORMAL HIGH (ref 70–99)
Sodium: 139 mEq/L (ref 135–145)

## 2011-11-22 LAB — PROTIME-INR: INR: 2.37 — ABNORMAL HIGH (ref 0.00–1.49)

## 2011-11-22 MED ORDER — ACETAMINOPHEN 325 MG PO TABS
650.0000 mg | ORAL_TABLET | Freq: Once | ORAL | Status: AC
  Administered 2011-11-22: 650 mg via ORAL
  Filled 2011-11-22: qty 2

## 2011-11-22 MED ORDER — SODIUM CHLORIDE 0.9 % IV BOLUS (SEPSIS)
500.0000 mL | Freq: Once | INTRAVENOUS | Status: AC
  Administered 2011-11-22: 500 mL via INTRAVENOUS

## 2011-11-22 MED ORDER — TETANUS-DIPHTH-ACELL PERTUSSIS 5-2.5-18.5 LF-MCG/0.5 IM SUSP
0.5000 mL | Freq: Once | INTRAMUSCULAR | Status: AC
  Administered 2011-11-22: 0.5 mL via INTRAMUSCULAR
  Filled 2011-11-22: qty 0.5

## 2011-11-22 MED ORDER — IOHEXOL 300 MG/ML  SOLN
80.0000 mL | Freq: Once | INTRAMUSCULAR | Status: AC | PRN
  Administered 2011-11-22: 80 mL via INTRAVENOUS

## 2011-11-22 NOTE — Discharge Instructions (Signed)
Take tylenol over the counter as instructed on bottle for pain. Return to ED if chest pain worsens, if you have shortness of breath, difficulty breathing, dizziness, palpitation, fainting or for any other symptoms that concern you.   Chest Wall Pain Chest wall pain is pain in or around the bones and muscles of your chest. It may take up to 6 weeks to get better. It may take longer if you must stay physically active in your work and activities.  CAUSES  Chest wall pain may happen on its own. However, it may be caused by:  A viral illness like the flu.   Injury.   Coughing.   Exercise.   Arthritis.   Fibromyalgia.   Shingles.  HOME CARE INSTRUCTIONS   Avoid overtiring physical activity. Try not to strain or perform activities that cause pain. This includes any activities using your chest or your abdominal and side muscles, especially if heavy weights are used.   Put ice on the sore area.   Put ice in a plastic bag.   Place a towel between your skin and the bag.   Leave the ice on for 15 to 20 minutes per hour while awake for the first 2 days.   Only take over-the-counter or prescription medicines for pain, discomfort, or fever as directed by your caregiver.  SEEK IMMEDIATE MEDICAL CARE IF:   Your pain increases, or you are very uncomfortable.   You have a fever.   Your chest pain becomes worse.   You have new, unexplained symptoms.   You have nausea or vomiting.   You feel sweaty or lightheaded.   You have a cough with phlegm (sputum), or you cough up blood.  MAKE SURE YOU:   Understand these instructions.   Will watch your condition.   Will get help right away if you are not doing well or get worse.  Document Released: 07/15/2005 Document Revised: 07/04/2011 Document Reviewed: 03/11/2011 [facility] Patient Information 2012 [place], [place].Chest Pain (Nonspecific) It is often hard to give a specific diagnosis for the cause of chest pain. There is always a chance  that your pain could be related to something serious, such as a heart attack or a blood clot in the lungs. You need to follow up with your caregiver for further evaluation. CAUSES   Heartburn.   Pneumonia or bronchitis.   Anxiety or stress.   Inflammation around your heart (pericarditis) or lung (pleuritis or pleurisy).   A blood clot in the lung.   A collapsed lung (pneumothorax). It can develop suddenly on its own (spontaneous pneumothorax) or from injury (trauma) to the chest.   Shingles infection (herpes zoster virus).  The chest wall is composed of bones, muscles, and cartilage. Any of these can be the source of the pain.  The bones can be bruised by injury.   The muscles or cartilage can be strained by coughing or overwork.   The cartilage can be affected by inflammation and become sore (costochondritis).  DIAGNOSIS  Lab tests or other studies, such as X-rays, electrocardiography, stress testing, or cardiac imaging, may be needed to find the cause of your pain.  TREATMENT   Treatment depends on what may be causing your chest pain. Treatment may include:   Acid blockers for heartburn.   Anti-inflammatory medicine.   Pain medicine for inflammatory conditions.   Antibiotics if an infection is present.   You may be advised to change lifestyle habits. This includes stopping smoking and avoiding alcohol, caffeine, and  chocolate.   You may be advised to keep your head raised (elevated) when sleeping. This reduces the chance of acid going backward from your stomach into your esophagus.   Most of the time, nonspecific chest pain will improve within 2 to 3 days with rest and mild pain medicine.  HOME CARE INSTRUCTIONS   If antibiotics were prescribed, take your antibiotics as directed. Finish them even if you start to feel better.   For the next few days, avoid physical activities that bring on chest pain. Continue physical activities as directed.   Do not smoke.   Avoid  drinking alcohol.   Only take over-the-counter or prescription medicine for pain, discomfort, or fever as directed by your caregiver.   Follow your caregiver's suggestions for further testing if your chest pain does not go away.   Keep any follow-up appointments you made. If you do not go to an appointment, you could develop lasting (chronic) problems with pain. If there is any problem keeping an appointment, you must call to reschedule.  SEEK MEDICAL CARE IF:   You think you are having problems from the medicine you are taking. Read your medicine instructions carefully.   Your chest pain does not go away, even after treatment.   You develop a rash with blisters on your chest.  SEEK IMMEDIATE MEDICAL CARE IF:   You have increased chest pain or pain that spreads to your arm, neck, jaw, back, or abdomen.   You develop shortness of breath, an increasing cough, or you are coughing up blood.   You have severe back or abdominal pain, feel nauseous, or vomit.   You develop severe weakness, fainting, or chills.   You have a fever.  THIS IS AN EMERGENCY. Do not wait to see if the pain will go away. Get medical help at once. Call your local emergency services (911 in [place] Do not drive yourself to the hospital. MAKE SURE YOU:   Understand these instructions.   Will watch your condition.   Will get help right away if you are not doing well or get worse.  Document Released: 04/24/2005 Document Revised: 07/04/2011 Document Reviewed: 02/18/2008 [facility] Patient Information 2012 [place], [place].Contusion A contusion is a deep bruise. Contusions are the result of an injury that caused bleeding under the skin. The contusion may turn blue, purple, or yellow. Minor injuries will give you a painless contusion, but more severe contusions may stay painful and swollen for a few weeks.  CAUSES  A contusion is usually caused by a blow, trauma, or direct force to an area of the body. SYMPTOMS    Swelling and redness of the injured area.   Bruising of the injured area.   Tenderness and soreness of the injured area.   Pain.  DIAGNOSIS  The diagnosis can be made by taking a history and physical exam. An X-ray, CT scan, or MRI may be needed to determine if there were any associated injuries, such as fractures. TREATMENT  Specific treatment will depend on what area of the body was injured. In general, the best treatment for a contusion is resting, icing, elevating, and applying cold compresses to the injured area. Over-the-counter medicines may also be recommended for pain control. Ask your caregiver what the best treatment is for your contusion. HOME CARE INSTRUCTIONS   Put ice on the injured area.   Put ice in a plastic bag.   Place a towel between your skin and the bag.   Leave the ice on  for 15 to 20 minutes, 3 to 4 times a day.   Only take over-the-counter or prescription medicines for pain, discomfort, or fever as directed by your caregiver. Your caregiver may recommend avoiding anti-inflammatory medicines (aspirin, ibuprofen, and naproxen) for 48 hours because these medicines may increase bruising.   Rest the injured area.   If possible, elevate the injured area to reduce swelling.  SEEK IMMEDIATE MEDICAL CARE IF:   You have increased bruising or swelling.   You have pain that is getting worse.   Your swelling or pain is not relieved with medicines.  MAKE SURE YOU:   Understand these instructions.   Will watch your condition.   Will get help right away if you are not doing well or get worse.  Document Released: 04/24/2005 Document Revised: 07/04/2011 Document Reviewed: 05/20/2011 [facility] Patient Information 2012 [place], [place].Home Safety and Preventing Falls Falls are a leading cause of injury and while they affect all age groups, falls have greater short-term and long-term impact on older age groups. However, falls should not be a part of life or aging. It  is possible for individuals and their families to use preventive measures to significantly decrease the likelihood that anyone, especially an older adult, will fall. There are many simple measures which can make your home safer with respect to preventing falls. The following actions can help reduce falls among all members of your family and are especially important as you age, when your balance, lower limb strength, coordination, and eyesight may be declining. The use of preventive measures will help to reduce you and your family's risk of falls and serious medical consequences. OUTDOORS  Repair cracks and edges of walkways and driveways.   Remove high doorway thresholds and trim shrubbery on the main path into your home.   Ensure there is good outside lighting at main entrances and along main walkways.   Clear walkways of tools, rocks, debris, and clutter.   Check that handrails are not broken and are securely fastened. Both sides of steps should have handrails.   In the garage, be attentive to and clean up grease or oil spills on the cement. This can make the surface extremely slippery.   In winter, have leaves, snow, and ice cleared regularly.   Use sand or salt on walkways during winter months.  BATHROOM  Install grab bars by the toilet and in the tub and shower.   Use non-skid mats or decals in the tub or shower.   If unable to easily stand unsupported while showering, place a plastic non slip stool in the shower to sit on when needed.   Install night lights.   Keep floors dry and clean up all water on the floor immediately.   Remove soap buildup in tub or shower on a regular basis.   Secure bath mats with non-slip, double-sided rug tape.   Remove tripping hazards from the floors.  BEDROOMS  Install night lights.   Do not use oversized bedding.   Make sure a bedside light is easy to reach.   Keep a telephone by your bedside.   Make sure that you can get in and out  of your bed easily.   Have a firm chair, with side arms, to use for getting dressed.   Remove clutter from around closets.   Store clothing, bed coverings, and other household items where you can reach them comfortably.   Remove tripping hazards from the floor.  LIVING AREAS AND STAIRWAYS  Turn on  lights to avoid having to walk through dark areas.   Keep lighting uniform in each room. Place brighter lightbulbs in darker areas, including stairways.   Replace lightbulbs that burn out in stairways immediately.   Arrange furniture to provide for clear pathways.   Keep furniture in the same place.   Eliminate or tape down electrical cables in high traffic areas.   Place handrails on both sides of stairways. Use handrails when going up or down stairs.   Most falls occur on the top or bottom 3 steps.   Fix any loose handrails. Make sure handrails on both sides of the stairways are as long as the stairs.   Remove all walkway obstacles.   Coil or tape electrical cords off to the side of walking areas and out of the way. If using many extension cords, have an electrician put in a new wall outlet to reduce or eliminate them.   Make sure spills are cleaned up quickly and allow time for drying before walking on freshly cleaned floors.   Firmly attach carpet with non-skid or two-sided tape.   Keep frequently used items within easy reach.   Remove tripping hazards such as throw rugs and clutter in walkways. Never leave objects on stairs.   Get rid of throw rugs elsewhere if possible.   Eliminate uneven floor surfaces.   Make sure couches and chairs are easy to get into and out of.   Check carpeting to make sure it is firmly attached along stairs.   Make repairs to worn or loose carpet promptly.   Select a carpet pattern that does not visually hide the edge of steps.   Avoid placing throw rugs or scatter rugs at the top or bottom of stairways, or properly secure with carpet tape  to prevent slippage.   Have an electrician put in a light switch at the top and bottom of the stairs.   Get light switches that glow.   Avoid the following practices: hurrying, inattention, obscured vision, carrying large loads, and wearing slip-on shoes.   Be aware of all pets.  KITCHEN  Place items that are used frequently, such as dishes and food, within easy reach.   Keep handles on pots and pans toward the center of the stove. Use back burners when possible.   Make sure spills are cleaned up quickly and allow time for drying.   Avoid walking on wet floors.   Avoid hot utensils and knives.   Position shelves so they are not too high or low.   Place commonly used objects within easy reach.   If necessary, use a sturdy step stool with a grab bar when reaching.   Make sure electrical cables are out of the way.   Do not use floor polish or wax that makes floors slippery.  OTHER HOME FALL PREVENTION STRATEGIES  Wear low heel or rubber sole shoes that are supportive and fit well.   Wear closed toe shoes.   Know and watch for side effects of medications. Have your caregiver or pharmacist look at all your medicines, even over-the-counter medicines. Some medicines can make you sleepy or dizzy.   Exercise regularly. Exercise makes you stronger and improves your balance and coordination.   Limit use of alcohol.   Use eyeglasses if necessary and keep them clean. Have your vision checked every year.   Organize your household in a manner that minimizes the need to walk distances when hurried, or go up and down stairs unnecessarily.  For example, have a phone placed on at least each floor of your home. If possible, have a phone beside each sitting or lying area where you spend the most time at home. Keep emergency numbers posted at all phones.   Use non-skid floor wax.   When using a ladder, make sure:   The base is firm.   All ladder feet are on level ground.   The ladder  is angled against the wall properly.   When climbing a ladder, face the ladder and hold the ladder rungs firmly.   If reaching, always keep your hips and body weight centered between the rails.   When using a stepladder, make sure it is fully opened and both spreaders are firmly locked.   Do not climb a closed stepladder.   Avoid climbing beyond the second step from the top of a stepladder and the 4th rung from the top of an extension ladder.   Learn and use mobility aids as needed.   Change positions slowly. Arise slowly from sitting and lying positions. Sit on the edge of your bed before getting to your feet.   If you have a history of falls, ask someone to add color or contrast paint or tape to grab bars and handrails in your home.   If you have a history of falls, ask someone to place contrasting color strips on first and last steps.   Install an electrical emergency response system if you need one, and know how to use it.   If you have a medical or other condition that causes you to have limited physical strength, it is important that you reach out to family and friends for occasional help.  FOR CHILDREN:  If young children are in the home, use safety gates. At the top of stairs use screw-mounted gates; use pressure-mounted gates for the bottom of the stairs and doorways between rooms.   Young children should be taught to descend stairs on their stomachs, feet first, and later using the handrail.   Keep drawers fully closed to prevent them from being climbed on or pulled out entirely.   Move chairs, cribs, beds and other furniture away from windows.   Consider installing window guards on windows ground floor and up, unless they are emergency fire exits. Make sure they have easy release mechanisms.   Consider installing special locks that only allow the window to be opened to a certain height.   Never rely on window screens to prevent falls.   Never leave babies alone on  changing tables, beds or sofas. Use a changing table that has a restraining strap.   When a child can pull to a standing position, the crib mattress should be adjusted to its lowest position. There should be at least 26 inches between the top rails of the crib drop side and the mattress. Toys, bumper pads, and other objects that can be used as steps to climb out should be removed from the crib.   On bunk beds never allow a child under age 72 to sleep on the top bunk. For older children, if the upper bunk is not against a wall, use guard rails on both sides. No matter how old a child is, keep the guard rails in place on the top bunk since children roll during sleep. Do not permit horseplay on bunks.   Grass and soil surfaces beneath backyard playground equipment should be replaced with hardwood chips, shredded wood mulch, sand, pea gravel, rubber, crushed  stone, or another safer material at depths of at least 9 to 12 inches.   When riding bikes or using skates, skateboards, skis, or snowboards, require children to wear helmets. Look for those that have stickers stating that they meet or exceed safety standards.   Vertical posts or pickets in deck, balcony, and stairway railings should be no more than 3 1/2 inches apart if a young baby will have access to the area. The space between horizontal rails or bars, and between the floor and the first horizontal rail or bar, should be no more than 3 1/2 inches.  Document Released: 07/05/2002 Document Revised: 07/04/2011 Document Reviewed: 05/04/2009 [facility] Patient Information 2012 [place], [place].

## 2011-11-22 NOTE — ED Notes (Signed)
Family at beside. Family given emotional support. 

## 2011-11-22 NOTE — Progress Notes (Signed)
Orthopedic Tech Progress Note Patient Details:  Richard Bradley [birth year] [id number] Level 2 trauma visit. Patient ID: Richard Bradley, male   DOB: January 11, 1926, 78 y.o.   MRN: [id number]   [name] 11/22/2011, 6:24 PM

## 2011-11-22 NOTE — ED Provider Notes (Signed)
History     CSN: [id number]  Arrival date & time 11/22/11  1754   First MD Initiated Contact with Patient 11/22/11 1759      Chief Complaint  Patient presents with  . Fall    (Consider location/radiation/quality/duration/timing/severity/associated sxs/prior treatment) Patient is a 78 y.o. male presenting with fall. The history is provided by the patient. No language interpreter was used.  Fall The accident occurred less than 1 hour ago. The fall occurred while walking. He fell from a height of 6 to 10 ft. He landed on a hard floor. There was no blood loss. Point of impact: Back. Pain location: chest. The pain is at a severity of 4/10. The pain is mild. He was ambulatory at the scene. There was no entrapment after the fall. There was no drug use involved in the accident. There was no alcohol use involved in the accident. Pertinent negatives include no visual change, no fever, no numbness, no abdominal pain, no bowel incontinence, no nausea, no vomiting, no hematuria, no headaches, no hearing loss, no loss of consciousness and no tingling. Treatment on scene includes a c-collar and a backboard.    Past Medical History  Diagnosis Date  . Kidney stones   . Urine incontinence   . Atrial fibrillation   . Osteoarthritis   . Prostate cancer dx'd 1982  . Myasthenia gravis     Past Surgical History  Procedure Date  . Prostate surgery   . Hernia repair   . Total knee arthroplasty   . Rotator cuff repair     No family history on file.  History  Substance Use Topics  . Smoking status: Never Smoker   . Smokeless tobacco: Never Used  . Alcohol Use: No      Review of Systems  Constitutional: Negative for fever and chills.  HENT: Negative for neck pain and neck stiffness.   Eyes: Negative for visual disturbance.  Respiratory: Negative for cough, chest tightness, shortness of breath, wheezing and stridor.   Cardiovascular: Negative for chest pain, palpitations and leg swelling.    Gastrointestinal: Negative for nausea, vomiting, abdominal pain, diarrhea, constipation, blood in stool, abdominal distention and bowel incontinence.  Genitourinary: Negative for dysuria, urgency, hematuria and difficulty urinating.  Musculoskeletal: Negative for back pain and gait problem.  Skin: Negative for rash.  Neurological: Negative for dizziness, tingling, tremors, seizures, loss of consciousness, syncope, facial asymmetry, speech difficulty, weakness, light-headedness, numbness and headaches.  Hematological: Negative for adenopathy. Does not bruise/bleed easily.  Psychiatric/Behavioral: Negative for confusion.    Allergies  Review of patient's allergies indicates no known allergies.  Home Medications   Current Outpatient Rx  Name Route Sig Dispense Refill  . PREDNISONE 10 MG PO TABS Oral Take 30 mg by mouth daily.    . MEGESTROL ACETATE 20 MG PO TABS Oral Take 20 mg by mouth 2 (two) times daily.     Marland Kitchen METOPROLOL TARTRATE 50 MG PO TABS Oral Take 25 mg by mouth 3 (three) times daily.     Marland Kitchen MYCOPHENOLATE MOFETIL 500 MG PO TABS Oral Take 1,000 mg by mouth 2 (two) times daily.    Marland Kitchen OMEPRAZOLE 20 MG PO CPDR Oral Take 20 mg by mouth daily.     Marland Kitchen PYRIDOSTIGMINE BROMIDE 60 MG PO TABS Oral Take 120 mg by mouth 3 (three) times daily.    . WARFARIN SODIUM 5 MG PO TABS Oral Take 5 mg by mouth as directed.        BP 106/78  Pulse 78  Temp(Src) 98.1 F (36.7 C) (Oral)  Resp 21  SpO2 94%  Physical Exam  Constitutional: He is oriented to person, place, and time. He appears well-developed and well-nourished. No distress.  HENT:  Head: Normocephalic and atraumatic.  Right Ear: External ear normal.  Left Ear: External ear normal.  Nose: Nose normal.  Mouth/Throat: Oropharynx is clear and moist.  Eyes: Conjunctivae and EOM are normal. Pupils are equal, round, and reactive to light. No scleral icterus.  Neck: Normal range of motion. Neck supple. No JVD present.  Cardiovascular: Normal  rate, regular rhythm, normal heart sounds and intact distal pulses.   No murmur heard. Pulmonary/Chest: Effort normal and breath sounds normal. No stridor. No respiratory distress. He has no decreased breath sounds. He has no wheezes. He has no rhonchi. He has no rales. He exhibits bony tenderness. He exhibits no mass, no tenderness, no laceration, no crepitus and no swelling.         No abrasions or bruising over chest.   Abdominal: Soft. Bowel sounds are normal. He exhibits no distension and no mass. There is no tenderness. There is no rebound and no guarding.  Musculoskeletal: Normal range of motion. He exhibits no edema and no tenderness.  Neurological: He is alert and oriented to person, place, and time.  Skin: Skin is warm and dry. He is not diaphoretic.  Psychiatric: He has a normal mood and affect.    ED Course  Procedures (including critical care time)  Labs Reviewed  CBC - Abnormal; Notable for the following:    WBC 10.6 (*)    RBC 3.94 (*)    MCV 101.8 (*)    Platelets 125 (*)    All other components within normal limits  BASIC METABOLIC PANEL - Abnormal; Notable for the following:    Glucose, Bld 127 (*)    GFR calc non Af Amer 62 (*)    GFR calc Af Amer 72 (*)    All other components within normal limits  PROTIME-INR - Abnormal; Notable for the following:    Prothrombin Time 26.3 (*)    INR 2.37 (*)    All other components within normal limits   Dg Forearm Left  11/22/2011  *RADIOLOGY REPORT*  Clinical Data: Left forearm pain and abrasions following a fall.  LEFT FOREARM - 2 VIEW  Comparison: None.  Findings: Diffuse osteopenia.  No fracture or dislocation seen. Atheromatous arterial calcifications.  IMPRESSION: No fracture or dislocation.  Original Report Authenticated By: Darrol Angel, M.D.   Ct Head Wo Contrast  11/22/2011  *RADIOLOGY REPORT*  Clinical Data:  Status post fall.  CT HEAD WITHOUT CONTRAST CT CERVICAL SPINE WITHOUT CONTRAST  Technique:   Multidetector CT imaging of the head and cervical spine was performed following the standard protocol without intravenous contrast.  Multiplanar CT image reconstructions of the cervical spine were also generated.  Comparison:  06/07/2011  CT HEAD  Findings: There is diffuse patchy low density throughout the subcortical and periventricular white matter consistent with chronic small vessel ischemic change.  There is prominence of the sulci and ventricles consistent with brain atrophy.  There is no evidence for acute brain infarct, hemorrhage or mass.  The paranasal sinuses and the mastoid air cells are clear. The skull appears intact.  IMPRESSION:  1.  Small vessel ischemic disease and brain atrophy. 2.  No acute findings.  CT CERVICAL SPINE  Findings: Normal alignment of the cervical spine.  The vertebral body heights are well  preserved.  There is multilevel disc space narrowing and ventral spurring.  Most severe at C5-6.  The facet joints are all well aligned.  No fracture or subluxation identified.  IMPRESSION:  1.  No acute findings. 2.  Cervical spondylosis noted.  Original Report Authenticated By: Rosealee Albee, M.D.   Ct Chest W Contrast  11/22/2011  *RADIOLOGY REPORT*  Clinical Data: Chest pain, recent fall  CT CHEST WITH CONTRAST  Technique:  Multidetector CT imaging of the chest was performed following the standard protocol during bolus administration of intravenous contrast.  Contrast: 80mL OMNIPAQUE IOHEXOL 300 MG/ML  SOLN  Comparison: 11/22/2011 radiograph, 06/18/2011 CT  Findings: Ectasia of the ascending aorta with mild scattered atherosclerosis of the arch and descending aorta. Cardiomegaly. Small pericardial effusion.  Coronary artery calcification.  Though not optimized evaluate the pulmonary vasculature, no main branch filling defect is identified.  Limited images through the upper abdomen show no acute abnormality. Left renal stone versus vascular calcification is similar to prior.  Numerous  bilateral peripheral calcified pleural plaques.  There are couple small nodules, for example on series 2, image 37, which is calcified.  No definitive new areas of nodularity.  Bibasilar scarring versus atelectasis.  Osteopenia.  Sclerotic focus within the L4 vertebral body anteriorly is nonspecific however similar to prior.  No acute osseous abnormality is identified.  IMPRESSION: Cardiomegaly and small pericardial effusion.  Bibasilar opacities, likely scarring versus atelectasis.  Calcified pleural plaques, similar to prior.  Original Report Authenticated By: Waneta Martins, M.D.   Ct Cervical Spine Wo Contrast  11/22/2011  *RADIOLOGY REPORT*  Clinical Data:  Status post fall.  CT HEAD WITHOUT CONTRAST CT CERVICAL SPINE WITHOUT CONTRAST  Technique:  Multidetector CT imaging of the head and cervical spine was performed following the standard protocol without intravenous contrast.  Multiplanar CT image reconstructions of the cervical spine were also generated.  Comparison:  06/07/2011  CT HEAD  Findings: There is diffuse patchy low density throughout the subcortical and periventricular white matter consistent with chronic small vessel ischemic change.  There is prominence of the sulci and ventricles consistent with brain atrophy.  There is no evidence for acute brain infarct, hemorrhage or mass.  The paranasal sinuses and the mastoid air cells are clear. The skull appears intact.  IMPRESSION:  1.  Small vessel ischemic disease and brain atrophy. 2.  No acute findings.  CT CERVICAL SPINE  Findings: Normal alignment of the cervical spine.  The vertebral body heights are well preserved.  There is multilevel disc space narrowing and ventral spurring.  Most severe at C5-6.  The facet joints are all well aligned.  No fracture or subluxation identified.  IMPRESSION:  1.  No acute findings. 2.  Cervical spondylosis noted.  Original Report Authenticated By: Rosealee Albee, M.D.   Dg Chest Portable 1  View  11/22/2011  *RADIOLOGY REPORT*  Clinical Data: Chest pain status post fall.  PORTABLE CHEST - 1 VIEW  Comparison: Chest CT 06/18/2011.  No radiographs are available for correlation.  Findings: 1815 hours.  There are lower lung volumes. This may account for enlargement of the cardiac silhouette, although an enlarging pericardial effusion cannot be excluded.  There is no evidence of mediastinal hematoma.  Pleural calcifications bilaterally appear stable. No underlying pulmonary parenchymal opacity or significant pleural effusion is identified.  There is no evidence of acute fracture.  IMPRESSION:  1.  Enlargement of cardiac silhouette compared with prior CT, possibly due to AP portable technique and  low lung volumes.  An enlarging pericardial effusion cannot be excluded. 2.  Stable pleural calcifications bilaterally.  Original Report Authenticated By: Gerrianne Scale, M.D.     1. Fall down steps   2. Chest wall pain     MDM  Pt is a well appearing 78yo M who myasthenia gravis who states he had a poor grasp on a stair railing today causing him to fall backwards ~6 steps to ground. Pt did hit his head but denies HA, LOC or vomiting. Only complaints is CP. VSS. AF. NAD. No concerning exam findings. No focal neuro deficits. CT scans show small pericardial effusion that was present on a prior CT scan. I do not feel this is traumatic in nature. No traumatic injuries on imaging. C-collar cleared with excellent ROM and no pain. Pt ambulated independently. L FA skin tear irrigated and closed with steri-strips. Tetanus updated. IVF given. No NSAIDS given 2/2 age. Pt refuses opoid's stating "they make me loopy". Pt instructed to take tylenol OTC for chest wall pain. Strict return precautions given including dizziness, syncope, SOB, worsening CP, palpitation of which the pt and family verbalized understanding.         Consuello Masse, MD 11/23/11 0000

## 2011-11-22 NOTE — ED Notes (Addendum)
Per ems, pt was coming down stairs about 10 stairs-brick stairs, and slipped falling backwards, hitting head and felt head tuck under, and felt chest hit stairs; no loc, a&oX4, c/o chest pain; C-collar placed on arrival to ED

## 2011-11-23 NOTE — ED Provider Notes (Signed)
I saw and evaluated the patient, reviewed the resident's note and I agree with the findings and plan.  Blunt chest trauma.  The patient feels much better at time of discharge.  This is likely a chronic pericardial effusion.  I doubly the patient to be bleeding and his pericardial space.  His vital signs are normal.  DC home in good condition.  Patient understands to return to the ER for new or worsening symptoms  1. Fall down steps   2. Chest wall pain       Lyanne Co, MD 11/23/11 [phone]

## 2011-11-28 ENCOUNTER — Encounter: Payer: Self-pay | Admitting: Neurology

## 2011-11-28 ENCOUNTER — Ambulatory Visit (INDEPENDENT_AMBULATORY_CARE_PROVIDER_SITE_OTHER): Payer: BC Managed Care – PPO | Admitting: Neurology

## 2011-11-28 VITALS — BP 98/60 | HR 92 | Wt 192.0 lb

## 2011-11-28 DIAGNOSIS — G7000 Myasthenia gravis without (acute) exacerbation: Secondary | ICD-10-CM

## 2011-11-28 NOTE — Progress Notes (Signed)
Dear Dr. 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  As you may recall, Richard Bradley is a 78 y.o. year old male with a history of myasthenia gravis who you diagnosed in November 2012, but who had been having primarily signs of ptosis and diplopia for about 1 year before that.  I started him on prednisone shortly after that.  However, Richard Bradley received IVIG in January 2012 from which Richard Bradley derived a good response.  At his last visit in the end of February I added Cellcept 1000 bid.  Unfortunately, Richard Bradley ended up with severe sepsis shortly afterwards, though possibly secondary to a UTI.  His Cellcept was stopped.  During his two week hospitalization in March Richard Bradley had his prednisone increased to 60mg  for about 1 week.  It was then returned to 30mg  daily by discharge.    Richard Bradley has been doing well since then, with remarkably little ptosis or swallowing difficulty.  Richard Bradley has no double vision.  Richard Bradley unfortunately did have an accidental fall about 1 week ago.  His daughter thinks Richard Bradley bruised his ribs, but Richard Bradley is doing regular incentive spirometry.  Medical history, social history, and family history were reviewed and have not changed since the last clinic visit.  Current Outpatient Prescriptions on File Prior to Visit  Medication Sig Dispense Refill  . metoprolol (LOPRESSOR) 50 MG tablet Take 50 mg by mouth 3 (three) times daily.       . predniSONE (DELTASONE) 10 MG tablet Take 30 mg by mouth daily.      Marland Kitchen pyridostigmine (MESTINON) 60 MG tablet Take 60 mg by mouth 3 (three) times daily.       Marland Kitchen warfarin (COUMADIN) 5 MG tablet Take 5 mg by mouth daily.       Marland Kitchen omeprazole (PRILOSEC) 20 MG capsule Take 20 mg by mouth daily.         No Known Allergies  ROS:  13 systems were reviewed and are notable for rib pain after fall.  All other review of systems are unremarkable.  Exam: . Filed Vitals:   11/28/11 1427  BP: 98/60  Pulse:  92  Weight: 192 lb (87.091 kg)    In general, well appearing, slightly cushingnoid facies.  Mental status:   The patient is oriented to person, place and time. Recent and remote memory are intact. Attention span and concentration are normal. Language including repetition, naming, following commands are intact. Fund of knowledge of current and historical events, as well as vocabulary are normal.  Cranial Nerves: Pupils are equally round and reactive to light. Visual fields full to confrontation. Mild weakness of eye closure, puffing out cheeks.  Mild tongue deviation weakness.  Counts to 18 on one breath.  Mild ptosis worse on the left.  Neck extension and flexion normal.  Motor:  Normal bulk and tone, no drift and 5/5 muscle strength bilaterally.  Reflexes:  1+ thoughout, toes down.   Gait:  Normal gait and station.  Impression/Recommendations:  1.  Seropositive ocular bulbar MG - I am going to hold him at 30mg  daily of the prednisone and 60 tid of the Mestinon.  If Richard Bradley worsens I will likely increase his prednisone or give him IVIG again.  We will see the patient back in 3 months.  Lupita Raider Modesto Charon, MD [facility], [place]

## 2011-12-27 ENCOUNTER — Other Ambulatory Visit: Payer: Self-pay

## 2011-12-27 MED ORDER — PREDNISONE 10 MG PO TABS: ORAL_TABLET | ORAL | Status: DC

## 2011-12-27 NOTE — Telephone Encounter (Signed)
Pt requesting refill of his prednisone.  [facility] pharmacy.

## 2011-12-27 NOTE — Telephone Encounter (Signed)
ok 30mg  daily 6 refills.

## 2011-12-27 NOTE — Telephone Encounter (Signed)
Rx called in 

## 2012-01-08 ENCOUNTER — Telehealth: Payer: Self-pay | Admitting: Neurology

## 2012-01-08 NOTE — Telephone Encounter (Signed)
Pt has been fatigued and had very low energy for the last 2 days. Daughter is wondering if he might need an increase in his prednisone dose?

## 2012-01-09 NOTE — Telephone Encounter (Signed)
I left a message for Richard Bradley to return my call.   

## 2012-01-09 NOTE — Telephone Encounter (Signed)
Richard Bradley returned my call. She states that for the past several days her dad has c/o extreme fatigue; no difficulty chewing or swallowing, no particular muscle weakness. Says that he can walk form one end of the house to the other and just be so tired. 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 She is wondering if the increase in the outside temperature has anything to do with his condition. Also, she is wondering if increasing his Prednisone (currently takes 30 mg a day) for a short period would be beneficial. I told her that I would let Dr. Modesto Charon know of his current situation and we would get back with her. She is ok to wait and states this is not an emergency. Call back number is [phone] (cell). He has a f/u appointment 02/28/12. **Dr. Modesto Charon please advise. Thank you.

## 2012-01-13 NOTE — Telephone Encounter (Signed)
It is possible that the temperature is affecting her dad.  If he is still feeling unwell we can increase his prednisone to 40mg  to see how he does.

## 2012-01-13 NOTE — Telephone Encounter (Signed)
Left a message for the patient's daughter to return my call.  

## 2012-01-14 NOTE — Telephone Encounter (Signed)
Richard Bradley returned my call. She reports that her dad is feeling better now and that they will continue the current dose of Prednisone and not increase it at this time. No other concerns voiced at this time.

## 2012-01-14 NOTE — Telephone Encounter (Signed)
Left a message for [name] to return my call.  

## 2012-01-16 ENCOUNTER — Other Ambulatory Visit: Payer: Self-pay | Admitting: Neurology

## 2012-01-16 ENCOUNTER — Telehealth: Payer: Self-pay | Admitting: Neurology

## 2012-01-16 MED ORDER — ESZOPICLONE 1 MG PO TABS: ORAL_TABLET | ORAL | Status: DC

## 2012-01-16 NOTE — Telephone Encounter (Signed)
**  Dr. Modesto Charon, per Misty Stanley, the patient mentioned Lunesta. Please advise. Thanks.

## 2012-01-16 NOTE — Telephone Encounter (Signed)
Left a message on the mobile number stating med faxed to the [facility] on [address]

## 2012-01-16 NOTE — Telephone Encounter (Signed)
let's start Lunesta 1mg  qhs to increase to 2mg  qhs.  dispense 60 1mg  tabs, 3 refills

## 2012-01-16 NOTE — Telephone Encounter (Signed)
Pt states that Prednisone is keeping him awake. He would like a sleeping aid sent to his pharmacy, [facility] on [address]

## 2012-01-23 ENCOUNTER — Telehealth: Payer: Self-pay | Admitting: Neurology

## 2012-01-23 ENCOUNTER — Other Ambulatory Visit: Payer: Self-pay | Admitting: Neurology

## 2012-01-23 MED ORDER — ZOLPIDEM TARTRATE 5 MG PO TABS: 5.0000 mg | ORAL_TABLET | Freq: Every evening | ORAL | Status: DC | PRN

## 2012-01-23 NOTE — Telephone Encounter (Signed)
Patient aware med for sleep faxed to the [facility] on [address]

## 2012-02-11 ENCOUNTER — Telehealth: Payer: Self-pay | Admitting: Neurology

## 2012-02-11 ENCOUNTER — Other Ambulatory Visit: Payer: Self-pay | Admitting: Neurology

## 2012-02-11 NOTE — Telephone Encounter (Signed)
Spoke with Richard Bradley. Patient to report to [facility] Short Stay tomorrow, July 17th at 0945 for a 1000 appointment for IVIG x 5 days. Lab work entered and will be drawn prior to infusion. Subsequent appointments will be made on each infusion day. No other questions at this time.

## 2012-02-11 NOTE — Telephone Encounter (Signed)
Called and spoke with Junious Dresser, patient's daughter. She had been out of town and just found out all of this information when she got home. Patient c/o blurred vision x 5 days; SOB: took himself to the Urgent Care on Saturday. Symptoms blamed on his cardiac issues and he was instructed to call his card MD. Still having SOB and vision issues; slurred speech in the evenings as he had before. Using oxygen. Denies difficulty eating or swallowing. Currently on Prednisone 30 mg daily. Also just diagnosed with a stress fracture in his tibia. Junious Dresser thinks he is having a slow onset of a MG crisis and wonders if he needs to be hospitalized for a "boost" which she states he had in January and responded well to. I told her I would let Dr. Modesto Charon know his current situation and we would be in touch. She is ok with this plan. **Dr. Modesto Charon, please advise. Thank you.

## 2012-02-11 NOTE — Telephone Encounter (Signed)
Pt went to Urgent Care on Saturday. Blurred vision for 4 days, has to be on O2 for sleeping. Daughter is afraid he is going into another Myasthenia Gravis "attack." Pt spoke to cardiologist, but would also like to talk to our office. Please call Junious Dresser his daughter.

## 2012-02-11 NOTE — Telephone Encounter (Signed)
Jan - could we set up Richard Bradley for IVIG in short stay.  We need to do 0.4g/kg x 5 days.  It is ok if it is broken up by the weekend.  Junious Dresser is aware.  Let her know the arrangements.

## 2012-02-12 ENCOUNTER — Encounter (HOSPITAL_COMMUNITY)
Admission: RE | Admit: 2012-02-12 | Discharge: 2012-02-12 | Disposition: A | Discharge status: Home / Self Care | Payer: BC Managed Care – PPO | Source: Ambulatory Visit | Attending: Neurology | Admitting: Neurology

## 2012-02-12 DIAGNOSIS — I4891 Unspecified atrial fibrillation: Secondary | ICD-10-CM | POA: Insufficient documentation

## 2012-02-12 DIAGNOSIS — M1990 Unspecified osteoarthritis, unspecified site: Secondary | ICD-10-CM | POA: Insufficient documentation

## 2012-02-12 DIAGNOSIS — G7000 Myasthenia gravis without (acute) exacerbation: Secondary | ICD-10-CM | POA: Insufficient documentation

## 2012-02-12 LAB — CBC WITH DIFFERENTIAL/PLATELET
Basophils Absolute: 0 10*3/uL (ref 0.0–0.1)
Basophils Relative: 0 % (ref 0–1)
Eosinophils Absolute: 0.1 10*3/uL (ref 0.0–0.7)
Eosinophils Relative: 1 % (ref 0–5)
HCT: 39.7 % (ref 39.0–52.0)
Hemoglobin: 13.3 g/dL (ref 13.0–17.0)
MCH: 33.7 pg (ref 26.0–34.0)
MCHC: 33.5 g/dL (ref 30.0–36.0)
MCV: 100.5 fL — ABNORMAL HIGH (ref 78.0–100.0)
Monocytes Absolute: 0.9 10*3/uL (ref 0.1–1.0)
Monocytes Relative: 7 % (ref 3–12)
RDW: 13.9 % (ref 11.5–15.5)

## 2012-02-12 LAB — COMPREHENSIVE METABOLIC PANEL
AST: 23 U/L (ref 0–37)
Albumin: 3.5 g/dL (ref 3.5–5.2)
BUN: 20 mg/dL (ref 6–23)
Calcium: 9.2 mg/dL (ref 8.4–10.5)
Creatinine, Ser: 1.02 mg/dL (ref 0.50–1.35)
GFR calc non Af Amer: 65 mL/min — ABNORMAL LOW (ref >90)

## 2012-02-12 LAB — PROTIME-INR
INR: 4.5 — ABNORMAL HIGH (ref 0.00–1.49)
Prothrombin Time: 43.4 seconds — ABNORMAL HIGH (ref 11.6–15.2)

## 2012-02-12 MED ORDER — DEXTROSE 5 % IV SOLN
INTRAVENOUS | Status: DC
  Administered 2012-02-12: 12:00:00 via INTRAVENOUS

## 2012-02-12 MED ORDER — IMMUNE GLOBULIN HUMAN NICU IV SYRINGE 100 MG/ML: 0.4000 g/kg | INTRAMUSCULAR | Status: DC

## 2012-02-12 MED ORDER — IMMUNE GLOBULIN (HUMAN) 10 GM/100ML IV SOLN
400.0000 mg/kg | Freq: Once | INTRAVENOUS | Status: AC
  Administered 2012-02-12: 35 g via INTRAVENOUS
  Filled 2012-02-12: qty 350

## 2012-02-13 ENCOUNTER — Telehealth: Payer: Self-pay | Admitting: Neurology

## 2012-02-13 ENCOUNTER — Encounter (HOSPITAL_COMMUNITY)
Admission: RE | Admit: 2012-02-13 | Discharge: 2012-02-13 | Disposition: A | Discharge status: Home / Self Care | Payer: BC Managed Care – PPO | Source: Ambulatory Visit | Attending: Neurology | Admitting: Neurology

## 2012-02-13 LAB — IGA: IgA: 146 mg/dL (ref 68–379)

## 2012-02-13 MED ORDER — DEXTROSE 5 % IV SOLN
INTRAVENOUS | Status: DC
  Administered 2012-02-13: 10:00:00 via INTRAVENOUS

## 2012-02-13 MED ORDER — IMMUNE GLOBULIN (HUMAN) 10 GM/100ML IV SOLN
400.0000 mg/kg | INTRAVENOUS | Status: DC
  Administered 2012-02-13: 35 g via INTRAVENOUS
  Filled 2012-02-13: qty 350

## 2012-02-13 NOTE — Telephone Encounter (Signed)
Left a message for Richard Bradley to return my call re: abnormal INR/PTT.

## 2012-02-13 NOTE — Telephone Encounter (Signed)
Spoke with Junious Dresser. Informed that her dad's INR > 4. She states his cardiologist is aware and his Coumadin dose was changed. Reinforced that they keep a close eye on that. She states they will.

## 2012-02-14 ENCOUNTER — Encounter (HOSPITAL_COMMUNITY)
Admission: RE | Admit: 2012-02-14 | Discharge: 2012-02-14 | Disposition: A | Discharge status: Home / Self Care | Payer: BC Managed Care – PPO | Source: Ambulatory Visit | Attending: Neurology | Admitting: Neurology

## 2012-02-14 MED ORDER — IMMUNE GLOBULIN (HUMAN) 10 GM/100ML IV SOLN
400.0000 mg/kg | INTRAVENOUS | Status: DC
  Administered 2012-02-14: 35 g via INTRAVENOUS
  Filled 2012-02-14 (×2): qty 350

## 2012-02-14 MED ORDER — DEXTROSE 5 % IV SOLN
INTRAVENOUS | Status: DC
  Administered 2012-02-14: 10:00:00 via INTRAVENOUS

## 2012-02-17 ENCOUNTER — Other Ambulatory Visit: Payer: Self-pay | Admitting: Neurology

## 2012-02-17 ENCOUNTER — Encounter (HOSPITAL_COMMUNITY)
Admission: RE | Admit: 2012-02-17 | Discharge: 2012-02-17 | Disposition: A | Discharge status: Home / Self Care | Payer: BC Managed Care – PPO | Source: Ambulatory Visit | Attending: Neurology | Admitting: Neurology

## 2012-02-17 ENCOUNTER — Telehealth: Payer: Self-pay | Admitting: Neurology

## 2012-02-17 MED ORDER — IMMUNE GLOBULIN (HUMAN) 10 GM/100ML IV SOLN
400.0000 mg/kg | Freq: Every day | INTRAVENOUS | Status: DC
  Administered 2012-02-17: 35 g via INTRAVENOUS
  Filled 2012-02-17: qty 350

## 2012-02-17 MED ORDER — DEXTROSE 5 % IV SOLN
Freq: Every day | INTRAVENOUS | Status: DC
  Administered 2012-02-17: 250 mL via INTRAVENOUS

## 2012-02-17 NOTE — Progress Notes (Signed)
1030 Patient states he had episode of shortness of breath this am at rest and states he normally has episodes of shortness of breath but they seem to be more frequent and worse.  Usually they are associated with activity but he is concerned that they are now coming at rest.  Thinks  They are worse in last nine days.  Jan at Dr Nash Dimmer office advised.  States she will advise the MD and call back if he has any further recommendations for the patient.

## 2012-02-17 NOTE — Telephone Encounter (Signed)
Received a call from [place] at [facility]. She wanted to let Dr. Modesto Charon know that patient reports that he is having increasing problems with his breathing, even at rest. He is there for day #4 of his IVIG. She states his lungs are clear and is in no obvious distress. Sts prior to the IVIG infusion was 92%. She put the patient on 2L [place] as the patient reports he uses O2 at home at [facility]. She wanted our office to write the order. I told her that I would let Dr. Modesto Charon know. Order placed in the hospital orders. I verbally reported this situation to Dr. Modesto Charon. He will work the patient in this Wednesday at 0830. I called back to Short Stay and let Jasmine December, RN know about the appointment. I then called and spoke with the patient's daughter, Richard Bradley and told her about the appointment. She states she will have him there. No other issues at this time.

## 2012-02-18 ENCOUNTER — Encounter (HOSPITAL_COMMUNITY)
Admission: RE | Admit: 2012-02-18 | Discharge: 2012-02-18 | Disposition: A | Discharge status: Home / Self Care | Payer: BC Managed Care – PPO | Source: Ambulatory Visit | Attending: Neurology | Admitting: Neurology

## 2012-02-18 MED ORDER — DEXTROSE 5 % IV SOLN
Freq: Every day | INTRAVENOUS | Status: AC
  Administered 2012-02-18: 10:00:00 via INTRAVENOUS

## 2012-02-18 MED ORDER — IMMUNE GLOBULIN (HUMAN) 10 GM/100ML IV SOLN
400.0000 mg/kg | Freq: Every day | INTRAVENOUS | Status: AC
  Administered 2012-02-18: 35 g via INTRAVENOUS
  Filled 2012-02-18 (×2): qty 350

## 2012-02-19 ENCOUNTER — Encounter: Payer: Self-pay | Admitting: Neurology

## 2012-02-19 ENCOUNTER — Telehealth: Payer: Self-pay | Admitting: Neurology

## 2012-02-19 ENCOUNTER — Ambulatory Visit (INDEPENDENT_AMBULATORY_CARE_PROVIDER_SITE_OTHER): Payer: BC Managed Care – PPO | Admitting: Neurology

## 2012-02-19 VITALS — BP 90/64 | HR 76 | Wt 194.0 lb

## 2012-02-19 DIAGNOSIS — R0602 Shortness of breath: Secondary | ICD-10-CM

## 2012-02-19 MED ORDER — MYCOPHENOLATE MOFETIL 500 MG PO TABS: 1000.0000 mg | ORAL_TABLET | Freq: Two times a day (BID) | ORAL | Status: DC

## 2012-02-19 MED ORDER — CLONAZEPAM 0.5 MG PO TABS: 0.2500 mg | ORAL_TABLET | Freq: Two times a day (BID) | ORAL | Status: DC | PRN

## 2012-02-19 MED ORDER — CITALOPRAM HYDROBROMIDE 10 MG PO TABS: ORAL_TABLET | ORAL | Status: DC

## 2012-02-19 MED ORDER — MYCOPHENOLATE MOFETIL 500 MG PO TABS: 1000.0000 mg | ORAL_TABLET | Freq: Two times a day (BID) | ORAL | Status: AC

## 2012-02-19 NOTE — Patient Instructions (Addendum)
Your appointment to see Dr. Delton Coombes is on Tuesday, August 27 at 2:00pm.  Please arrive by 1:45pm.  They are on the second floor of the same building we are in.  [address] [phone].  Your follow up with Dr. Modesto Charon is on Sept. 4 at 9:00am.  We will be in our new building by then [address].

## 2012-02-19 NOTE — Telephone Encounter (Signed)
Pt's daughter called to advise that pt sees PCP Dr. Leodis Rains, [phone]. Pt also sees cardiologist Dr. Deon Pilling, [phone].

## 2012-02-19 NOTE — Progress Notes (Signed)
Dear Dr. 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  As you may recall, he is a 78 y.o. year old male with a history of atrial fibrillation who is on prednisone and mestinon therapy for his ocularbulbar myasthenia.  He has had a worsening of MG earlier this year which required IVIG for which he responded.  I had placed him on prednisone and added cellcept, but he stopped the cellcept immediately afterwards after he had urosepsis.  I did not feel the cellcept contributed to the urosepsis.  He recently just called and said that he felt his breathing was worse as well as his double vision.  He was pretty much baseline with chewing and swallowing.  I decided to give him another round of IVIG which he just finished yesterday.  He feels his vision is better but is still having problems with SOB.  The SOB is intermittent, can awake him midway through sleep.  He is continuously checking his pulse ox, and sometimes it is low (<92%) but other times it is not.  He acknowledges that anxiety is playing a significant role.  For several months his SOB was much better, and it has just gotten worse over the last few weeks.  His cardiologist recently increased his beta blocker, presumably to decrease his rate of his Afib, but this has not seemed to help.  He denies any appendicular weakness.  He continues to have problems with early morning awakening.  He has tried Ambien and Tylenol pm but this has made him sleepy in the am.    Medical history, social history, and family history were reviewed and have not changed since the last clinic visit.  Current Outpatient Prescriptions on File Prior to Visit  Medication Sig Dispense Refill  . metoprolol (LOPRESSOR) 50 MG tablet Take 50 mg by mouth 3 (three) times daily.       Marland Kitchen omeprazole (PRILOSEC) 20 MG capsule Take 20 mg by mouth daily.       . predniSONE (DELTASONE) 10 MG tablet 3  tablets by mouth daily for total of 30mg   90 tablet  6  . pyridostigmine (MESTINON) 60 MG tablet Take 60 mg by mouth 3 (three) times daily.       Marland Kitchen warfarin (COUMADIN) 5 MG tablet Take 5 mg by mouth daily.       . citalopram (CELEXA) 10 MG tablet start with 1 tablet daily for 2 weeks, then increase to 2 tablets from then on.  60 tablet  6  . clonazePAM (KLONOPIN) 0.5 MG tablet Take 0.5-1 tablets (0.25-0.5 mg total) by mouth 2 (two) times daily as needed for anxiety.  60 tablet  2  . zolpidem (AMBIEN) 5 MG tablet Take 1 tablet (5 mg total) by mouth at bedtime as needed for sleep.  30 tablet  0   Current Facility-Administered Medications on File Prior to Visit  Medication Dose Route Frequency Provider Last Rate Last Dose  . dextrose 5 % solution   Intravenous Daily Milas Gain, MD 10 mL/hr at 02/18/12 1000    . Immune Globulin 10% (PRIVIGEN) SOLN 35 g  400 mg/kg (Order-Specific) Intravenous Daily Milas Gain, MD 348 mL/hr at 02/18/12 1050 35 g at 02/18/12 1050    No Known Allergies  ROS:  13 systems were reviewed and are notable for no significant diarrhea.  He had a recent "stress fracture" affecting his lower leg on  the left.  All other review of systems are unremarkable.  Exam: . Filed Vitals:   02/19/12 0828  BP: 90/64  Pulse: 76  Weight: 194 lb (87.998 kg)    In general, cushingnoid appear older man.  Lungs:  mild crackles upper right lung field.  Mental status:   The patient is oriented to person, place and time. Recent and remote memory are intact. Attention span and concentration are normal. Language including repetition, naming, following commands are intact. Fund of knowledge of current and historical events, as well as vocabulary are normal.  Cranial Nerves: Very mild ptosis.  No significant fatiguable ptosis.  Eyes move fully.  Mild eye closure weakness.  Mild cheek and tongue deviation weakness.  Does have a head drop, but on confrontation strong.  Counting on  exhaled breath is 24.  Motor:  5/5 muscle strength bilaterally.  Reflexes:  1+, but absent at ankles.  Gait:  Normal gait and station.  Romberg negative.  Impression/Recommendations:  1.  Myasthenia gravis - I think this is doing quite well.  I am concerned about the "stress fracture" and his cushingnoid appearance in the setting of prednisone, but this is to be expected.  I think he is getting a bit of a boost from his IVIG.  I do think we need a better long term immunosuppressant strategy, so I am going to retry his Cellcept at 1000 bid, with the hope of getting off the prednisone eventually. 2.  Shortness of breath - I do not think this is all neuromuscular.  I suspect some of this is anxiety, but I am wondering how much is cardiopulmonary.  I am going to have him see Rob Byrum in pulmonary to see if he has a primary pulmonary process that might be contributing.  What makes me suspicious of this is that it is relatively uncommon to have low O2 sats in MG unless someone is in crisis. 3.  Anxiety and depression - I think this is playing a role in his feeling of SOB.  I am going to start him on Celexa 10->20mg  and then give him clonazepam 0.25-0.5 bid prn for acute anxiety.  I am hopeful by helping his depression and anxiety his sleep improves as well.    We will see the patient back in 1  month.  Lupita Raider Modesto Charon, MD [facility], [place]

## 2012-02-26 ENCOUNTER — Telehealth: Payer: Self-pay | Admitting: Neurology

## 2012-02-26 NOTE — Telephone Encounter (Signed)
Pt has questions about clonazepam. It is making him woozy and dizzy. Effects seem to be constant. Please call pt to advise.

## 2012-02-27 NOTE — Telephone Encounter (Signed)
Called and spoke with the patient. He states that since stating the Clonazepam ordered by Dr. Modesto Charon at his last visit, he is having trouble with dizziness. He is only taking 1/4 tab at HS to help him sleep (ordered 1/2 - 1 tab BID). He is sleeping better, but suffers from dizziness the next day; brought on by turning his head or if he lays down. He is fine as long as he is just sitting and doesn't turn his head. Also adds that he is very sensitive to medications. I told him that I would let Dr. Modesto Charon know his current situation and that we would get back with him with additional information. He is fine to wait. **Dr. Modesto Charon, please advise. Thank you.

## 2012-02-27 NOTE — Telephone Encounter (Signed)
Spoke with the patient. Information given as per Dr. Modesto Charon below. The patient will call me in one week with an update on his dizziness.

## 2012-02-27 NOTE — Telephone Encounter (Signed)
stop the clonazepam for now.  I want to see if it his dizziness goes away.  Give [place] an update in a week, and at that time will likely start mirtazipine for sleep.

## 2012-02-28 ENCOUNTER — Ambulatory Visit: Payer: BC Managed Care – PPO | Admitting: Neurology

## 2012-03-02 NOTE — Telephone Encounter (Signed)
Patient called to report that he is no longer having an issue with dizziness and is taking the Clonazepam 1/2 tab and "sleeping like a baby." He wishes to continue taking the medication. He asked that I let Dr. Modesto Charon know that he is tolerating it fine now for whatever reason. I told him I would. No other concerns at this time. **Dr. Modesto Charon, Lorain Childes...Marland KitchenMarland Kitchen

## 2012-03-24 ENCOUNTER — Encounter: Payer: Self-pay | Admitting: Emergency Medicine

## 2012-03-24 ENCOUNTER — Ambulatory Visit (INDEPENDENT_AMBULATORY_CARE_PROVIDER_SITE_OTHER): Payer: BC Managed Care – PPO | Admitting: Emergency Medicine

## 2012-03-24 VITALS — BP 102/63 | HR 127 | Temp 97.8°F | Ht 75.0 in | Wt 197.0 lb

## 2012-03-24 DIAGNOSIS — R0609 Other forms of dyspnea: Secondary | ICD-10-CM

## 2012-03-24 NOTE — Patient Instructions (Addendum)
We will perform full pulmonary function testing at your next office visit We will obtain your cardiology records from [facility] Follow with Dr Delton Coombes next available with full PFT

## 2012-03-24 NOTE — Progress Notes (Signed)
Subjective:    Patient ID: Richard Bradley, male    DOB: April 19, 1926, 78 y.o.   MRN: [id number]  HPI 78 yo never smoker, hx of A fib, myasthenia gravis (dx 11/12) followed by Dr Modesto Charon and on Cellsept, prednisone, mestinon. CT scan of the chest with some bibasilar scar vs plaquing - he states that he has had these followed for over 50 years. He is referred to evaluate SOB. He describes DOE that goes back over 1.5 yrs, worse in the Summer. He had a MG crisis in January that require hospitalization and ventilation. He also had UTI with septic shock in January, was sent home on oxygen - wears very rarely, checks his oximetry and occasionally seen desats. He has had some falls. He does cough in the am, no sputum. He does have some wheeze. No dysphagia or swallowing difficulty. He recently had IVIg for 5 doses - this helped his breathing in addition to everything else.    Review of Systems  Constitutional: Positive for unexpected weight change. Negative for fever, chills, diaphoresis, activity change, appetite change and fatigue.  HENT: Positive for trouble swallowing. Negative for nosebleeds, congestion, sore throat, rhinorrhea, sneezing, mouth sores, dental problem, voice change, postnasal drip, sinus pressure and tinnitus.   Eyes: Negative for visual disturbance.  Respiratory: Positive for shortness of breath. Negative for cough, choking, chest tightness and wheezing.   Cardiovascular: Positive for palpitations and leg swelling. Negative for chest pain.  Gastrointestinal: Negative for nausea, vomiting, abdominal pain, constipation and blood in stool.  Genitourinary: Negative for difficulty urinating.  Musculoskeletal: Negative for gait problem.  Skin: Negative for rash.  Neurological: Positive for dizziness. Negative for tremors, weakness, light-headedness, numbness and headaches.  Hematological: Does not bruise/bleed easily.  Psychiatric/Behavioral: Negative for confusion, disturbed wake/sleep cycle  and agitation. The patient is nervous/anxious.     Past Medical History  Diagnosis Date  . Kidney stones   . Urine incontinence   . Atrial fibrillation   . Osteoarthritis   . Prostate cancer dx'd 1982  . Myasthenia gravis      Family History  Problem Relation Age of Onset  . Heart disease Father   . Heart disease Brother   . Heart disease Paternal Grandfather      History   Social History  . Marital Status: Married    Spouse Name: N/A    Number of Children: 2  . Years of Education: N/A   Occupational History  .      magistrate   Social History Main Topics  . Smoking status: Never Smoker   . Smokeless tobacco: Never Used  . Alcohol Use: No  . Drug Use: No  . Sexually Active: Not on file   Other Topics Concern  . Not on file   Social History Narrative  . 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   No Known Allergies   Outpatient Prescriptions Prior to Visit  Medication Sig Dispense Refill  . citalopram (CELEXA) 10 MG tablet start with 1 tablet daily for 2 weeks, then increase to 2 tablets from then on.  60 tablet  6  . metoprolol (LOPRESSOR) 50 MG tablet Take 50 mg by mouth 3 (three) times daily. 1/2 tablet 3 times a day      . mycophenolate (CELLCEPT) 500 MG tablet Take 2 tablets (1,000 mg total) by mouth 2 (two) times daily.  120 tablet  6  . omeprazole (  PRILOSEC) 20 MG capsule Take 20 mg by mouth daily.       . predniSONE (DELTASONE) 10 MG tablet 3 tablets by mouth daily for total of 30mg   90 tablet  6  . pyridostigmine (MESTINON) 60 MG tablet Take 60 mg by mouth 3 (three) times daily.       Marland Kitchen warfarin (COUMADIN) 5 MG tablet Take 5 mg by mouth daily.       Marland Kitchen zolpidem (AMBIEN) 5 MG tablet Take 1 tablet (5 mg total) by mouth at bedtime as needed for sleep.  30 tablet  0  . clonazePAM (KLONOPIN) 0.5 MG tablet Take 0.5-1 tablets (0.25-0.5 mg total) by mouth 2 (two) times daily as needed for anxiety.   60 tablet  2       Objective:   Physical Exam Filed Vitals:   03/24/12 1420  BP: 102/63  Pulse: 127  Temp: 97.8 F (36.6 C)   Gen: Pleasant, elderly man, in no distress,  normal affect  ENT: No lesions,  mouth clear,  oropharynx clear, no postnasal drip  Neck: No JVD, no TMG, no carotid bruits  Lungs: No use of accessory muscles, no dullness to percussion, clear without rales or rhonchi  Cardiovascular: RRR, heart sounds normal, no murmur or gallops, no peripheral edema  Musculoskeletal: No deformities, no cyanosis or clubbing  Neuro: alert, non focal, good strength  Skin: Warm, no lesions or rashes    CT scan Chest 11/22/11: IMPRESSION:  Cardiomegaly and small pericardial effusion.  Bibasilar opacities, likely scarring versus atelectasis. Calcified  pleural plaques, similar to prior      Assessment & Plan:  DOE (dyspnea on exertion) Certainly the MG is a factor as his sx have waxed and waned. There may also be other underlying factors including possible lung injury from his hospitalization for sepsis, the pleural based scars seen on Ct chest (etiology unclear??).  - full PFT's to establish baseline and to look for other factors that could contribute to his dyspnea - consider other eval >> he has a cardiologist in [place], presume that he has had TTE, etc. Will need to review records - rov to discuss

## 2012-03-24 NOTE — Assessment & Plan Note (Signed)
Certainly the MG is a factor as his sx have waxed and waned. There may also be other underlying factors including possible lung injury from his hospitalization for sepsis, the pleural based scars seen on Ct chest (etiology unclear??).  - full PFT's to establish baseline and to look for other factors that could contribute to his dyspnea - consider other eval >> he has a cardiologist in [place], presume that he has had TTE, etc. Will need to review records - rov to discuss

## 2012-03-31 ENCOUNTER — Telehealth: Payer: Self-pay | Admitting: Emergency Medicine

## 2012-03-31 ENCOUNTER — Ambulatory Visit (INDEPENDENT_AMBULATORY_CARE_PROVIDER_SITE_OTHER): Payer: BC Managed Care – PPO | Admitting: Internal Medicine

## 2012-03-31 ENCOUNTER — Encounter: Payer: Self-pay | Admitting: Internal Medicine

## 2012-03-31 ENCOUNTER — Other Ambulatory Visit (INDEPENDENT_AMBULATORY_CARE_PROVIDER_SITE_OTHER): Payer: BC Managed Care – PPO

## 2012-03-31 ENCOUNTER — Ambulatory Visit (INDEPENDENT_AMBULATORY_CARE_PROVIDER_SITE_OTHER)
Admission: RE | Admit: 2012-03-31 | Discharge: 2012-03-31 | Disposition: A | Discharge status: Home / Self Care | Payer: BC Managed Care – PPO | Source: Ambulatory Visit | Attending: Internal Medicine | Admitting: Internal Medicine

## 2012-03-31 VITALS — BP 126/84 | HR 60 | Ht 75.0 in | Wt 196.0 lb

## 2012-03-31 DIAGNOSIS — J45909 Unspecified asthma, uncomplicated: Secondary | ICD-10-CM

## 2012-03-31 DIAGNOSIS — R0989 Other specified symptoms and signs involving the circulatory and respiratory systems: Secondary | ICD-10-CM

## 2012-03-31 DIAGNOSIS — I4891 Unspecified atrial fibrillation: Secondary | ICD-10-CM

## 2012-03-31 LAB — BASIC METABOLIC PANEL
CO2: 28 mEq/L (ref 19–32)
GFR: 65.68 mL/min (ref >60.00)
Glucose, Bld: 127 mg/dL — ABNORMAL HIGH (ref 70–99)
Potassium: 3.8 mEq/L (ref 3.5–5.1)
Sodium: 141 mEq/L (ref 135–145)

## 2012-03-31 MED ORDER — FUROSEMIDE 20 MG PO TABS: 20.0000 mg | ORAL_TABLET | Freq: Every day | ORAL | Status: DC

## 2012-03-31 MED ORDER — METHYLPREDNISOLONE ACETATE 80 MG/ML IJ SUSP
80.0000 mg | Freq: Once | INTRAMUSCULAR | Status: AC
  Administered 2012-03-31: 80 mg via INTRAMUSCULAR

## 2012-03-31 NOTE — Telephone Encounter (Signed)
I spoke with pt and he c/o difficulty breathing, wheezing, and coughing,. Today he felt like his air was being cut off. CDY had an opening this afternoon and is scheduled to come in at 3:30 to be evaluated. Will forward to Dr. Delton Coombes so he is aware

## 2012-03-31 NOTE — Telephone Encounter (Signed)
Thanks

## 2012-03-31 NOTE — Assessment & Plan Note (Signed)
Acute onset with in last 4 days of a bronchitis syndrome. Crescendo pattern described over the last 3 days does not suggest an aspiration event and would be more consistent with fluid retention Plan-reflux precautions were educated. Order chest x-ray, CBC, chemistry, B. natruretic peptide         Low-dose Lasix diuretic, Depo-Medrol

## 2012-03-31 NOTE — Assessment & Plan Note (Signed)
Ventricular response rate seem.s appropriate

## 2012-03-31 NOTE — Patient Instructions (Addendum)
Order- CXR    Dx asthma with bronchitis              Lab- CBC, BMET, BNP, D-dimer   Neb xop 0.63   Depo 80  Script sent for diuretic furosemide

## 2012-03-31 NOTE — Progress Notes (Signed)
Subjective:    Patient ID: Richard Bradley, male    DOB: Jan 01, 1926, 78 y.o.   MRN: [id number]  HPI 03/24/12- Richard Bradley 78 yo never smoker, hx of A fib, myasthenia gravis (dx 11/12) followed by Richard Bradley and on Cellsept, prednisone, mestinon. CT scan of the chest with some bibasilar scar vs plaquing - he states that he has had these followed for over 50 years. He is referred to evaluate SOB. He describes DOE that goes back over 1.5 yrs, worse in the Summer. He had a MG crisis in January that require hospitalization and ventilation. He also had UTI with septic shock in January, was sent home on oxygen - wears very rarely, checks his oximetry and occasionally seen desats. He has had some falls. He does cough in the am, no sputum. He does have some wheeze. No dysphagia or swallowing difficulty. He recently had IVIg for 5 doses - this helped his breathing in addition to everything else.  Assessed:  DOE (dyspnea on exertion) Certainly the MG is a factor as his sx have waxed and waned. There may also be other underlying factors including possible lung injury from his hospitalization for sepsis, the pleural based scars seen on Ct chest (etiology unclear??).  - full PFT's to establish baseline and to look for other factors that could contribute to his dyspnea - consider other eval >> he has a cardiologist in [place], presume that he has had TTE, etc. Will need to review records - rov to discuss  03/31/12-  Richard Bradley.     Richard Bradley patient 78 yo never smoker, hx of A fib, myasthenia gravis (dx 11/12) followed by Richard Bradley and on Cellsept, prednisone, mestinon. CT scan of the chest with some bibasilar scar vs plaquing - he states that he has had these followed for over 50 years. ACUTE VISIT: Richard Bradley patient-seen last week by Richard Bradley; started having cough -non productive; SOB, and wheezing.  Over the last 3 days he has had crescendo increasing cough and wheeze mostly while supine and trying to sleep. This morning about 4 AM he  will short of breath and wheezing. Oximeter was 91% on room air. He had not been using his oxygen but put it on with no benefit. Got up for an hour, felt gradually better and slept for an hour comfortably. Dry cough. Tussive soreness across anterior chest. No recent reflux events, cold, fever or sore throat. Denies calf pain. Clean heart cath by his description last year in [place], [place]. Long history of atrial fib. No history of MI.  ROS-see HPI Constitutional:   No-   weight loss, night sweats, fevers, chills, fatigue, lassitude. HEENT:   No-  headaches, difficulty swallowing, tooth/dental problems, sore throat,       No-  sneezing, itching, ear ache, nasal congestion, post nasal drip,  CV:  + Noncardiac chest pain, orthopnea, PND, +swelling in lower extremities, anasarca,dizziness, palpitations Resp: No-   shortness of breath with exertion or at rest.              No-   productive cough,  + non-productive cough,  No- coughing up of blood.              No-   change in color of mucus.  No- wheezing.   Skin: No-   rash or lesions. GI:  No-   heartburn, indigestion, abdominal pain, nausea, vomiting,  GU: . MS:  No-   joint pain or swelling.  Neuro-  nothing unusual Psych:  No- change in mood or affect. No depression or anxiety.  No memory loss.  OBJ- Physical Exam General- Alert, Oriented, Affect-appropriate, Distress- none acute. Very talkative, maintaining room air saturation 94%. Skin- rash-none, lesions- none, excoriation- none Lymphadenopathy- none Head- atraumatic            Eyes- Gross vision intact, PERRLA, conjunctivae and secretions clear            Ears- Hearing, canals-normal            Nose- Clear, no-Septal dev, mucus, polyps, erosion, perforation             Throat- Mallampati II , mucosa clear , drainage- none, tonsils- atrophic Neck- flexible , trachea midline, no stridor , thyroid nl, carotid no bruit Chest - symmetrical excursion , unlabored            Heart/CV- IRR/rate controlled , no murmur , no gallop  , no rub, nl s1 s2                           - JVD+ 1 cm , edema+ 1, stasis changes- none, varices- none           Lung- clear to P&A, wheeze- none, cough- none , dullness-none, rub- none           Chest wall-  Abd-  Br/ Gen/ Rectal- Not done, not indicated Extrem- cyanosis- none, clubbing, none, atrophy- none, strength- nl Neuro- grossly intact to observation  Assessment & Plan:

## 2012-04-01 ENCOUNTER — Encounter: Payer: Self-pay | Admitting: Neurology

## 2012-04-01 ENCOUNTER — Ambulatory Visit (INDEPENDENT_AMBULATORY_CARE_PROVIDER_SITE_OTHER): Payer: BC Managed Care – PPO | Admitting: Neurology

## 2012-04-01 VITALS — BP 110/60 | HR 86 | Wt 194.0 lb

## 2012-04-01 DIAGNOSIS — G7000 Myasthenia gravis without (acute) exacerbation: Secondary | ICD-10-CM

## 2012-04-01 LAB — CBC WITH DIFFERENTIAL/PLATELET
Eosinophils Relative: 0 % (ref 0.0–5.0)
Monocytes Relative: 3.6 % (ref 3.0–12.0)
Neutrophils Relative %: 91.8 % — ABNORMAL HIGH (ref 43.0–77.0)
Platelets: 192 10*3/uL (ref 150.0–400.0)
WBC: 8.9 10*3/uL (ref 4.5–10.5)

## 2012-04-01 MED ORDER — PYRIDOSTIGMINE BROMIDE 60 MG PO TABS: 90.0000 mg | ORAL_TABLET | Freq: Three times a day (TID) | ORAL | Status: DC

## 2012-04-01 NOTE — Progress Notes (Signed)
Quick Note:  LMTCB 04-01-12 ______

## 2012-04-01 NOTE — Progress Notes (Signed)
Dear Dr. 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 As you may recall, he is a 78 y.o. year old male with a history of atrial fibrillation who is on prednisone and mestinon therapy for his ocularbulbar myasthenia. He has had a worsening of MG earlier this year in January which required IVIG for which he responded. I had placed him on prednisone and added cellcept, but he stopped the cellcept immediately afterwards after he had urosepsis. I did not feel the cellcept contributed to the urosepsis.   Before his last appointment  he felt his breathing was worse as well as his double vision. He was pretty much baseline with chewing and swallowing. I decided to give him another round of IVIG.  He does feel somewhat better since getting the IVIG about six weeks ago.  He continues to have problems with intermittent SOB. I felt the SOB could not be completely explained by the MG as he has low O2Sats at times which would not be typical of MG unless he was in crisis.    Pulmonology has recently seen him given his history of pleural based scarring.  They felt the SOB is likely multifactorial.  He also saw them urgently for worsening breathing yesterday due to what is thought to be bronchitis with coughing and SOB.  He received steroids as well as a dose of Lasix.  His biggest complaint today is his droopiness of his eye lids and blurriness of vision(which is not clearly double).  He denies appendicular weakness, chewing or swallowing difficulties.    At his last appointment I started him on Celexa 20mg  for anxiety which seems to have helped.  He is also using clonazepam 0.25-0.5 bid for anxiety.   At his last appointment I also restarted him on Cellcept with the goal of eventually getting him off the prednisone.   Medical history, social history, and family history were reviewed and have not changed since the last clinic  visit.  Current Outpatient Prescriptions on File Prior to Visit  Medication Sig Dispense Refill  . citalopram (CELEXA) 10 MG tablet start with 1 tablet daily for 2 weeks, then increase to 2 tablets from then on.  60 tablet  6  . clonazePAM (KLONOPIN) 0.5 MG tablet Take 0.5-1 tablets (0.25-0.5 mg total) by mouth 2 (two) times daily as needed for anxiety.  60 tablet  2  . furosemide (LASIX) 20 MG tablet Take 1 tablet (20 mg total) by mouth daily. As needed diuretic  15 tablet  1  . metoprolol (LOPRESSOR) 50 MG tablet Take 50 mg by mouth 3 (three) times daily. 1/2 tablet 3 times a day      . mycophenolate (CELLCEPT) 500 MG tablet Take 2 tablets (1,000 mg total) by mouth 2 (two) times daily.  120 tablet  6  . omeprazole (PRILOSEC) 20 MG capsule Take 20 mg by mouth daily.       . predniSONE (DELTASONE) 10 MG tablet 3 tablets by mouth daily for total of 30mg   90 tablet  6  . warfarin (COUMADIN) 5 MG tablet Take 5 mg by mouth daily.       Marland Kitchen zolpidem (AMBIEN) 5 MG tablet Take 1 tablet (5 mg total) by mouth at bedtime as needed for sleep.  30 tablet  0  . DISCONTD: pyridostigmine (MESTINON) 60 MG tablet Take 60 mg by mouth 3 (three) times daily.  Current Facility-Administered Medications on File Prior to Visit  Medication Dose Route Frequency Provider Last Rate Last Dose  . methylPREDNISolone acetate (DEPO-MEDROL) injection 80 mg  80 mg Intramuscular Once Richard Budge, MD   80 mg at 03/31/12 1643    No Known Allergies  ROS:  13 systems were reviewed and are notable for anxiety.  All other review of systems are unremarkable.  Exam: . Filed Vitals:   04/01/12 0901  BP: 110/60  Pulse: 86  Weight: 194 lb (87.998 kg)    In general, well appearing older man.  Chest:  distant breath sounds but appears clear, no wheezes.  Mental status:   The patient is oriented to person, place and time. Recent and remote memory are intact. Attention span and concentration are normal. Language  including repetition, naming, following commands are intact. Fund of knowledge of current and historical events, as well as vocabulary are normal.  Cranial Nerves: Slight right greater than left ptosis.  Mild eye closure weakness.  Mild lower facial weakness.  Lateral tongue deviation mildly weak.  Palatal elevation good.  Neck flexion and neck extension strong.  Counts to 18 on one breath.  Motor:  Normal bulk and tone, no drift and 5/5 muscle strength bilaterally, except for 4+ at Hip flexors.  Gait:  Normal gait and station.  Romberg negative.  Impression/Recommendations:  1.  Ocular bulbar MG - relatively well controlled.  It is difficult to say if his improvement is due to IVIG or starting the Cellcept.  However, I think at this time he is in quite good shape.  I have suggested he try a slightly higher dose of Mestinon for his ptosis(90 tid) to see if this helps.  He may still get benefit from the Cellcept.  I am going to refer him to [facility] to be followed for his MG.  I think I would attempt a wean of prednisone.  I am going to add on LFTs today to his previous labs for the Cellcep, but his WBC counts seem fine. 2. SOB - likely multifactorial with some NM compromise.  I appreciate Dr. Kavin Bradley and Dr. Roxy Bradley excellent care. 3.  Immunecompromise state - I am going to curbside ID about whether it makes sense to put Richard Bradley Bradley on Bactrim for infection prophylaxis.  Richard Raider Modesto Charon, MD [facility], [place]

## 2012-04-01 NOTE — Progress Notes (Signed)
Quick Note:  Pt aware of results. ______ 

## 2012-04-02 LAB — HEPATIC FUNCTION PANEL
ALT: 15 U/L (ref 0–53)
AST: 24 U/L (ref 0–37)
Bilirubin, Direct: 0.1 mg/dL (ref 0.0–0.3)
Total Bilirubin: 0.7 mg/dL (ref 0.3–1.2)

## 2012-04-03 ENCOUNTER — Telehealth: Payer: Self-pay | Admitting: Neurology

## 2012-04-03 NOTE — Telephone Encounter (Signed)
Spoke with Junious Dresser. Information given as per Dr. Modesto Charon below. No other concerns voiced at this time. She is aware referral sent to [facility]. Awaiting an appointment date and time.

## 2012-04-03 NOTE — Telephone Encounter (Signed)
Left a message for Junious Dresser to return my call.

## 2012-04-03 NOTE — Telephone Encounter (Signed)
Did check with my wife.  She did not think he needed abx prophylaxis at his dose of steroids.  Also they do not usually give it while on Cellcept.  She felt that his problems with urosepsis shortly after starting the Cellcept the first time were unrelated.

## 2012-04-03 NOTE — Telephone Encounter (Signed)
Pt's daughter called to find out if you ever checked on Mr. Goldwire being able to take Bactrim. You told her you were going to ask Dr. Drue Second.

## 2012-04-06 ENCOUNTER — Telehealth: Payer: Self-pay | Admitting: Neurology

## 2012-04-06 NOTE — Telephone Encounter (Signed)
Richard Bradley a voicemail message that her dad's appointment at [facility] is scheduled for 05/22/12 at 0930 with Dr. Weston Settle. New patient information to arrive for him in the mail. Asked her to call if questions.

## 2012-04-08 ENCOUNTER — Telehealth: Payer: Self-pay

## 2012-04-08 NOTE — Telephone Encounter (Signed)
Yes

## 2012-04-08 NOTE — Telephone Encounter (Signed)
Pt's daughter calling.  Pt has had diarrhea since increasing the mestinon and he is taking imodium, but as soon as it wears off the diarrhea comes back.  She would like to know if he can go back to his previous does of mestinon.

## 2012-04-08 NOTE — Progress Notes (Signed)
Quick Note:  LMTCB ______ 

## 2012-04-08 NOTE — Progress Notes (Signed)
Quick Note:  Pt aware of results. No further questions. ______ 

## 2012-04-09 NOTE — Telephone Encounter (Signed)
Left voice mail on [name]'s phone with answer.

## 2012-04-21 ENCOUNTER — Telehealth: Payer: Self-pay | Admitting: Emergency Medicine

## 2012-04-21 NOTE — Telephone Encounter (Signed)
Called and spoke w patient informed him that Papers have been received and are in RBs look-at.  Patient verbalized understanding and nothing further needed at this time.

## 2012-04-30 ENCOUNTER — Ambulatory Visit (INDEPENDENT_AMBULATORY_CARE_PROVIDER_SITE_OTHER): Payer: BC Managed Care – PPO | Admitting: Adult Health

## 2012-04-30 ENCOUNTER — Ambulatory Visit (INDEPENDENT_AMBULATORY_CARE_PROVIDER_SITE_OTHER): Payer: BC Managed Care – PPO | Admitting: Emergency Medicine

## 2012-04-30 ENCOUNTER — Encounter: Payer: Self-pay | Admitting: Adult Health

## 2012-04-30 ENCOUNTER — Ambulatory Visit: Payer: BC Managed Care – PPO | Admitting: Emergency Medicine

## 2012-04-30 VITALS — BP 108/60 | HR 101 | Temp 97.6°F | Ht 75.0 in | Wt 195.0 lb

## 2012-04-30 DIAGNOSIS — R0609 Other forms of dyspnea: Secondary | ICD-10-CM

## 2012-04-30 DIAGNOSIS — R0989 Other specified symptoms and signs involving the circulatory and respiratory systems: Secondary | ICD-10-CM

## 2012-04-30 LAB — PULMONARY FUNCTION TEST

## 2012-04-30 MED ORDER — OMEPRAZOLE 20 MG PO CPDR: 20.0000 mg | DELAYED_RELEASE_CAPSULE | Freq: Every day | ORAL | Status: DC

## 2012-04-30 MED ORDER — BUDESONIDE-FORMOTEROL FUMARATE 80-4.5 MCG/ACT IN AERO: 2.0000 | INHALATION_SPRAY | Freq: Two times a day (BID) | RESPIRATORY_TRACT | Status: DC

## 2012-04-30 MED ORDER — FUROSEMIDE 20 MG PO TABS: 20.0000 mg | ORAL_TABLET | Freq: Every day | ORAL | Status: DC

## 2012-04-30 NOTE — Patient Instructions (Addendum)
Begin Symbicort 80/4.18mcg 2 puffs Twice daily  , brush/rinse and gargle after use.  Continue on Lasix daily  Follow up Dr. Delton Coombes  In 4 weeks and As needed   Get copies of your cardiologist notes, and 2 D echo report.

## 2012-04-30 NOTE — Assessment & Plan Note (Signed)
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 Delton Coombes  4 weeks and As needed   Get copy of last echo.

## 2012-04-30 NOTE — Addendum Note (Signed)
Addended by: Boone Master E on: 04/30/2012 11:00 AM   Modules accepted: Orders

## 2012-04-30 NOTE — Progress Notes (Signed)
PFT done today. 

## 2012-04-30 NOTE — Progress Notes (Signed)
Subjective:    Patient ID: Richard Bradley, male    DOB: [birth year], 78 y.o.   MRN: [id number]  HPI Initial Pulmonary consult  03/24/12- Dr Delton Coombes 78 yo never smoker, hx of A fib, myasthenia gravis (dx 11/12) followed by Dr Modesto Charon and on Cellsept, prednisone, mestinon. CT scan of the chest with some bibasilar scar vs plaquing - he states that he has had these followed for over 50 years. He is referred to evaluate SOB. He describes DOE that goes back over 1.5 yrs, worse in the Summer. He had a MG crisis in January that require hospitalization and ventilation. He also had UTI with septic shock in January, was sent home on oxygen - wears very rarely, checks his oximetry and occasionally seen desats. He has had some falls. He does cough in the am, no sputum. He does have some wheeze. No dysphagia or swallowing difficulty. He recently had IVIg for 5 doses - this helped his breathing in addition to everything else.  >felt  MG is a factor as his sx have waxed and waned. There may also be other underlying factors including possible lung injury from his hospitalization for sepsis, the pleural based scars seen on Ct chest (etiology unclear??). ,  Set up for a  full PFT's  ?echo in past   Acute OV 03/31/12-  Dr Maple Hudson.        RB patient-seen last week by RB; started having cough -non productive; SOB, and wheezing.  Over the last 3 days he has had crescendo increasing cough and wheeze mostly while supine and trying to sleep. This morning about 4 AM he will short of breath and wheezing. Oximeter was 91% on room air. He had not been using his oxygen but put it on with no benefit. Got up for an hour, felt gradually better and slept for an hour comfortably. Dry cough. Tussive soreness across anterior chest. No recent reflux events, cold, fever or sore throat. Denies calf pain. Clean heart cath by his description last year in [place], [place]. Long history of atrial fib. No history of MI. >>BNP sl elevated ~300 , D.  Dimer neg. CXR  Showed small bilat. Pleural effusions, new compresssion fx.  Given Depo 80 , and lasix    04/30/2012 Follow up and PFT (rescheduled from Dr. Delton Coombes  Schedule) Returns for follow up w/ PFT>FEV1 1.88L/64%, 65 ratio ,no change s/p  BD  Small airways with reversibility , DLCO 62%.    Seen 1 month ago for flare of cough, felt to have some fluid overload with elevated BNP and sm effusions on  Cxr. Rx Lasix . Which he feels has helped greatly. Wants refill.  Depo shot really helped cough last ov.  Continues on prednisone 30mg  daily for his MG.   Lives in [place], [place] . Cardiology is there, has follow up ov next week Discussed getting copies of notes and previous echo.  Stays with his daughter in [place] on/off -she is a PT at [employer].   Discussed compression fx on recent cxr, denies back pain.   DOE some better. Cough and congestion slightly less but does have on most days    Review of Systems Constitutional:   No  weight loss, night sweats,  Fevers, chills,  +fatigue, or  lassitude.  HEENT:   No headaches,  Difficulty swallowing,  Tooth/dental problems, or  Sore throat,                No sneezing, itching, ear  ache, nasal congestion, post nasal drip,   CV:  No chest pain,  Orthopnea, PND, swelling in lower extremities, anasarca, dizziness, palpitations, syncope.   GI  No heartburn, indigestion, abdominal pain, nausea, vomiting, diarrhea, change in bowel habits, loss of appetite, bloody stools.   Resp:  No change in color of mucus.     No chest wall deformity  Skin: no rash or lesions.  GU: no dysuria, change in color of urine, no urgency or frequency.  No flank pain, no hematuria   MS:  No joint pain or swelling.     No back pain.  Psych:  No change in mood or affect. No depression or anxiety.  No memory loss.         Objective:   Physical Exam GEN: A/Ox3; pleasant , NAD elderly  HEENT:  [place]/AT,  EACs-clear, TMs-wnl, NOSE-clear, THROAT-clear, no lesions, no  postnasal drip or exudate noted.   NECK:  Supple w/ fair ROM; no JVD; normal carotid impulses w/o bruits; no thyromegaly or nodules palpated; no lymphadenopathy.  RESP  Clear  P & A; w/o, wheezes/ rales/ or rhonchi.no accessory muscle use, no dullness to percussion  CARD:  RRR, no m/r/g  , no peripheral edema, pulses intact, no cyanosis or clubbing.  GI:   Soft & nt; nml bowel sounds; no organomegaly or masses detected.  Musco: Warm bil, no deformities or joint swelling noted.   Neuro: alert, no focal deficits noted.    Skin: Warm, no lesions or rashes         Assessment & Plan:

## 2012-05-01 ENCOUNTER — Encounter: Payer: Self-pay | Admitting: Emergency Medicine

## 2012-05-20 ENCOUNTER — Telehealth: Payer: Self-pay | Admitting: Emergency Medicine

## 2012-05-20 NOTE — Telephone Encounter (Signed)
Spoke with pt. He states that he has head and nasal congestion recently, which is improving on nasonex that his PCP prescribed. He states that he is feeling better, and just called to see if he could get a sooner appt with RB to go over recent test results.  I advised that there are no sooner appts and to call sooner if his symptoms come back Pt verbalized understanding and states nothing further needed.

## 2012-05-26 ENCOUNTER — Encounter: Payer: Self-pay | Admitting: Emergency Medicine

## 2012-05-26 ENCOUNTER — Ambulatory Visit (INDEPENDENT_AMBULATORY_CARE_PROVIDER_SITE_OTHER): Payer: BC Managed Care – PPO | Admitting: Emergency Medicine

## 2012-05-26 VITALS — BP 100/70 | HR 66 | Temp 97.6°F | Ht 75.0 in | Wt 191.2 lb

## 2012-05-26 DIAGNOSIS — R0609 Other forms of dyspnea: Secondary | ICD-10-CM

## 2012-05-26 DIAGNOSIS — J302 Other seasonal allergic rhinitis: Secondary | ICD-10-CM

## 2012-05-26 DIAGNOSIS — J309 Allergic rhinitis, unspecified: Secondary | ICD-10-CM

## 2012-05-26 MED ORDER — LORATADINE 10 MG PO TABS: 10.0000 mg | ORAL_TABLET | Freq: Every day | ORAL | Status: DC

## 2012-05-26 NOTE — Assessment & Plan Note (Signed)
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 Obstruction from fixed asthma  - do not start symbicort - start SABA prn, if beneficial call us to fill script - Needs repeat CT scan next year

## 2012-05-26 NOTE — Patient Instructions (Addendum)
Continue the nasonex Start loratadine 10mg  daily (Claritin) If you continue to have nasal drainage call our office - we may add a medication called astelin Do not start Symbicort Try using Ventolin 2 puffs if needed for shortness of breath or before exercise. Do not use any more frequently than every four hours Continue your lasix Follow with Dr Delton Coombes in 6 weeks

## 2012-05-26 NOTE — Progress Notes (Signed)
Subjective:    Patient ID: Richard Bradley, male    DOB: 1925/11/24, 78 y.o.   MRN: [id number]  HPI Initial Pulmonary consult  03/24/12- Dr Delton Coombes 78 yo never smoker, hx of A fib, myasthenia gravis (dx 11/12) followed by Dr Modesto Charon and on Cellsept, prednisone, mestinon. CT scan of the chest with some bibasilar scar vs plaquing - he states that he has had these followed for over 50 years. He is referred to evaluate SOB. He describes DOE that goes back over 1.5 yrs, worse in the Summer. He had a MG crisis in January that require hospitalization and ventilation. He also had UTI with septic shock in January, was sent home on oxygen - wears very rarely, checks his oximetry and occasionally seen desats. He has had some falls. He does cough in the am, no sputum. He does have some wheeze. No dysphagia or swallowing difficulty. He recently had IVIg for 5 doses - this helped his breathing in addition to everything else.  >felt  MG is a factor as his sx have waxed and waned. There may also be other underlying factors including possible lung injury from his hospitalization for sepsis, the pleural based scars seen on Ct chest (etiology unclear??). ,  Set up for a  full PFT's  ?echo in past   Acute OV 03/31/12-  Dr Maple Hudson.        RB patient-seen last week by RB; started having cough -non productive; SOB, and wheezing.  Over the last 3 days he has had crescendo increasing cough and wheeze mostly while supine and trying to sleep. This morning about 4 AM he will short of breath and wheezing. Oximeter was 91% on room air. He had not been using his oxygen but put it on with no benefit. Got up for an hour, felt gradually better and slept for an hour comfortably. Dry cough. Tussive soreness across anterior chest. No recent reflux events, cold, fever or sore throat. Denies calf pain. Clean heart cath by his description last year in [place], [place]. Long history of atrial fib. No history of MI. >>BNP sl elevated ~300 , D.  Dimer neg. CXR  Showed small bilat. Pleural effusions, new compresssion fx.  Given Depo 80 , and lasix    04/30/12 -- Follow up and PFT (rescheduled from Dr. Delton Coombes  Schedule) Returns for follow up w/ PFT>FEV1 1.88L/64%, 65 ratio ,no change s/p  BD  Small airways with reversibility , DLCO 62%.    Seen 1 month ago for flare of cough, felt to have some fluid overload with elevated BNP and sm effusions on  Cxr. Rx Lasix . Which he feels has helped greatly. Wants refill.  Depo shot really helped cough last ov.  Continues on prednisone 30mg  daily for his MG.   Lives in [place], [place] . Cardiology is there, has follow up ov next week Discussed getting copies of notes and previous echo.  Stays with his daughter in [place] on/off -she is a PT at [employer].   Discussed compression fx on recent cxr, denies back pain.   DOE some better. Cough and congestion slightly less but does have on most days  ROV 05/26/12 -- hx myasthenia, A fib, dilated non-ischemic CM, pleural scar on CT scan chest, seen for dyspnea in 8/13. He had PFT and on a f/u visit Dr Maple Hudson started He was started on lasix by Dr Maple Hudson. He has subsequently been seen by TP as above for nasal congestion and drainage. He was  offered symbicort last visit, did not start this due to potential interaction/opposition to b-blocker.  His PFT look like mixed disease, likely due to his pleural plaques and scarring +/- fixed asthma??       Objective:   Physical Exam Filed Vitals:   05/26/12 1624  BP: 100/70  Pulse: 66  Temp: 97.6 F (36.4 C)   GEN: A/Ox3; pleasant , NAD elderly  HEENT:  [place]/AT,  EACs-clear, TMs-wnl, NOSE-clear, THROAT-clear, no lesions, no postnasal drip or exudate noted.   NECK:  Supple w/ fair ROM; no JVD; normal carotid impulses w/o bruits; no thyromegaly or nodules palpated; no lymphadenopathy.  RESP  Clear  P & A; w/o, wheezes/ rales/ or rhonchi.no accessory muscle use, no dullness to percussion  CARD:  RRR, no m/r/g  ,  no peripheral edema, pulses intact, no cyanosis or clubbing.  Musco: Warm bil, no deformities or joint swelling noted.   Neuro: alert, no focal deficits noted.    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 Obstruction from fixed asthma  - do not start symbicort - start SABA prn, if beneficial call us to fill script - Needs repeat CT scan next year

## 2012-05-26 NOTE — Assessment & Plan Note (Signed)
Continue nasonex Start loratadine

## 2012-07-01 ENCOUNTER — Encounter: Payer: Self-pay | Admitting: Emergency Medicine

## 2012-07-01 ENCOUNTER — Ambulatory Visit (INDEPENDENT_AMBULATORY_CARE_PROVIDER_SITE_OTHER): Payer: BC Managed Care – PPO | Admitting: Emergency Medicine

## 2012-07-01 VITALS — BP 118/68 | HR 55 | Temp 96.8°F | Ht 75.0 in | Wt 190.2 lb

## 2012-07-01 DIAGNOSIS — R0989 Other specified symptoms and signs involving the circulatory and respiratory systems: Secondary | ICD-10-CM

## 2012-07-01 DIAGNOSIS — R091 Pleurisy: Secondary | ICD-10-CM

## 2012-07-01 DIAGNOSIS — J929 Pleural plaque without asbestos: Secondary | ICD-10-CM

## 2012-07-01 NOTE — Assessment & Plan Note (Signed)
Needs repeat Ct scan chest in 10/2012

## 2012-07-01 NOTE — Progress Notes (Signed)
Subjective:    Patient ID: Richard Bradley, male    DOB: [birth year], 78 y.o.   MRN: [id number]  HPI Initial Pulmonary consult  03/24/12- Dr Delton Coombes 78 yo never smoker, hx of A fib, myasthenia gravis (dx 11/12) followed by Dr Modesto Charon and on Cellsept, prednisone, mestinon. CT scan of the chest with some bibasilar scar vs plaquing - he states that he has had these followed for over 50 years. He is referred to evaluate SOB. He describes DOE that goes back over 1.5 yrs, worse in the Summer. He had a MG crisis in January that require hospitalization and ventilation. He also had UTI with septic shock in January, was sent home on oxygen - wears very rarely, checks his oximetry and occasionally seen desats. He has had some falls. He does cough in the am, no sputum. He does have some wheeze. No dysphagia or swallowing difficulty. He recently had IVIg for 5 doses - this helped his breathing in addition to everything else.  >felt  MG is a factor as his sx have waxed and waned. There may also be other underlying factors including possible lung injury from his hospitalization for sepsis, the pleural based scars seen on Ct chest (etiology unclear??). ,  Set up for a  full PFT's  ?echo in past   Acute OV 03/31/12-  Dr Maple Hudson.        RB patient-seen last week by RB; started having cough -non productive; SOB, and wheezing.  Over the last 3 days he has had crescendo increasing cough and wheeze mostly while supine and trying to sleep. This morning about 4 AM he will short of breath and wheezing. Oximeter was 91% on room air. He had not been using his oxygen but put it on with no benefit. Got up for an hour, felt gradually better and slept for an hour comfortably. Dry cough. Tussive soreness across anterior chest. No recent reflux events, cold, fever or sore throat. Denies calf pain. Clean heart cath by his description last year in [place], [place]. Long history of atrial fib. No history of MI. >>BNP sl elevated ~300 , D.  Dimer neg. CXR  Showed small bilat. Pleural effusions, new compresssion fx.  Given Depo 80 , and lasix    04/30/12 -- Follow up and PFT (rescheduled from Dr. Delton Coombes  Schedule) Returns for follow up w/ PFT>FEV1 1.88L/64%, 65 ratio ,no change s/p  BD  Small airways with reversibility , DLCO 62%.    Seen 1 month ago for flare of cough, felt to have some fluid overload with elevated BNP and sm effusions on  Cxr. Rx Lasix . Which he feels has helped greatly. Wants refill.  Depo shot really helped cough last ov.  Continues on prednisone 30mg  daily for his MG.   Lives in [place], [place] . Cardiology is there, has follow up ov next week Discussed getting copies of notes and previous echo.  Stays with his daughter in [place] on/off -she is a PT at [employer].   Discussed compression fx on recent cxr, denies back pain.   DOE some better. Cough and congestion slightly less but does have on most days  ROV 05/26/12 -- hx myasthenia, A fib, dilated non-ischemic CM, pleural scar on CT scan chest, seen for dyspnea in 8/13. He had PFT and on a f/u visit Dr Maple Hudson. He was started on lasix by Dr Maple Hudson. He has subsequently been seen by TP as above for nasal congestion and drainage. He was offered  symbicort last visit, did not start this due to potential interaction/opposition to b-blocker.  His PFT look like mixed disease, likely due to his pleural plaques and scarring +/- fixed asthma??   ROV 07/01/12 -- f/u for multifactorial dyspnea, restriction (MG + pleural plaques), possible AFL. He is improved, believes this may be due to lasix. We started loratadine, ventolin prn. He is using the loratadine prn. He believes that the albuterol has been helpful. He is not using O2.       Objective:   Physical Exam Filed Vitals:   07/01/12 1348  BP: 118/68  Pulse: 55  Temp: 96.8 F (36 C)   GEN: A/Ox3; pleasant , NAD elderly  HEENT:  /AT,  EACs-clear, TMs-wnl, NOSE-clear, THROAT-clear, no lesions, no postnasal drip or  exudate noted.   NECK:  Supple w/ fair ROM; no JVD; normal carotid impulses w/o bruits; no thyromegaly or nodules palpated; no lymphadenopathy.  RESP  Clear  P & A; w/o, wheezes/ rales/ or rhonchi.no accessory muscle use, no dullness to percussion  CARD:  RRR, no m/r/g  , no peripheral edema, pulses intact, no cyanosis or clubbing.  Musco: Warm bil, no deformities or joint swelling noted.   Neuro: alert, no focal deficits noted.    Skin: Warm, no lesions or rashes      Assessment & Plan:  DOE (dyspnea on exertion) Improved since last time, seems to respond to albuterol. Not clear that he needs scheduled BD's.  - continue the albuterol prn - continue loratadine as needed.  - continue scheduled  - continue regimen for his Myasthenia.   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

## 2012-07-01 NOTE — Assessment & Plan Note (Addendum)
Improved since last time, seems to respond to albuterol. Not clear that he needs scheduled BD's.  - continue the albuterol prn - continue loratadine as needed.  - continue scheduled  - continue regimen for his Myasthenia.

## 2012-07-01 NOTE — Patient Instructions (Addendum)
Please continue your current medications We will repeat your CT scan in April 2014 Follow with Dr Delton Coombes in April after the scan, or sooner if you have any problems.

## 2012-07-27 ENCOUNTER — Other Ambulatory Visit: Payer: Self-pay | Admitting: Neurology

## 2012-07-30 ENCOUNTER — Other Ambulatory Visit: Payer: Self-pay | Admitting: Neurology

## 2012-09-11 ENCOUNTER — Other Ambulatory Visit: Payer: Self-pay | Admitting: Neurology

## 2012-10-08 ENCOUNTER — Other Ambulatory Visit: Payer: Self-pay | Admitting: Neurology

## 2012-11-02 ENCOUNTER — Other Ambulatory Visit: Payer: BC Managed Care – PPO

## 2012-11-10 ENCOUNTER — Telehealth: Payer: Self-pay | Admitting: Emergency Medicine

## 2012-11-11 NOTE — Telephone Encounter (Signed)
appt changed to 11/17/12@10 :00am pt is aware Tobe Sos

## 2012-11-17 ENCOUNTER — Other Ambulatory Visit: Payer: BC Managed Care – PPO

## 2012-12-10 ENCOUNTER — Ambulatory Visit: Payer: BC Managed Care – PPO | Admitting: Emergency Medicine

## 2012-12-23 ENCOUNTER — Ambulatory Visit: Payer: BC Managed Care – PPO | Admitting: Emergency Medicine

## 2013-10-25 ENCOUNTER — Telehealth: Payer: Self-pay | Admitting: Internal Medicine

## 2013-10-25 NOTE — Telephone Encounter (Addendum)
Pt moving from [place] to [place] - I will be his PCP here forth Needs INR this week for AF mgmt - please help him arrange same Thanks! Please contact his daughter [name] [phone] with this appt info

## 2013-10-26 ENCOUNTER — Ambulatory Visit (INDEPENDENT_AMBULATORY_CARE_PROVIDER_SITE_OTHER): Payer: 59 | Admitting: General Practice

## 2013-10-26 DIAGNOSIS — Z5181 Encounter for therapeutic drug level monitoring: Secondary | ICD-10-CM

## 2013-10-26 DIAGNOSIS — I4891 Unspecified atrial fibrillation: Secondary | ICD-10-CM

## 2013-10-26 LAB — POCT INR: INR: 2.5

## 2013-10-26 NOTE — Progress Notes (Signed)
Pre visit review using our clinic review tool, if applicable. No additional management support is needed unless otherwise documented below in the visit note. 

## 2013-11-23 ENCOUNTER — Ambulatory Visit (INDEPENDENT_AMBULATORY_CARE_PROVIDER_SITE_OTHER): Payer: 59 | Admitting: General Practice

## 2013-11-23 DIAGNOSIS — Z5181 Encounter for therapeutic drug level monitoring: Secondary | ICD-10-CM

## 2013-11-23 DIAGNOSIS — I4891 Unspecified atrial fibrillation: Secondary | ICD-10-CM

## 2013-11-23 LAB — POCT INR: INR: 2

## 2013-11-23 NOTE — Progress Notes (Signed)
Pre visit review using our clinic review tool, if applicable. No additional management support is needed unless otherwise documented below in the visit note. 

## 2013-12-15 ENCOUNTER — Ambulatory Visit: Payer: 59 | Admitting: Emergency Medicine

## 2013-12-20 DIAGNOSIS — Z7901 Long term (current) use of anticoagulants: Secondary | ICD-10-CM | POA: Insufficient documentation

## 2013-12-22 ENCOUNTER — Ambulatory Visit: Payer: 59 | Admitting: Internal Medicine

## 2014-06-07 ENCOUNTER — Telehealth (HOSPITAL_COMMUNITY): Payer: Self-pay | Admitting: *Deleted

## 2014-06-07 NOTE — Telephone Encounter (Signed)
Spoke to pt about long due AWV--would like to be scheduled.

## 2014-06-16 ENCOUNTER — Institutional Professional Consult (permissible substitution): Payer: 59 | Admitting: Neurology

## 2014-06-17 ENCOUNTER — Telehealth: Payer: Self-pay | Admitting: Internal Medicine

## 2014-06-17 NOTE — Telephone Encounter (Signed)
Spoke with patient-- she has already scheduled appt with CY on 08/12/14 at 10am for a Sleep Consult. Referral packet to be given to [facility]. Will send to Katie to keep an eye out for this information to cross her desk.

## 2014-06-17 NOTE — Telephone Encounter (Signed)
Per CY-patient can keep appt scheduled or see another MD for sleep here. He has been seen by CY here for acute visit for RB(pulmonary); possible that he was seen in EMR or [place] chest office. Richard Bradley is contacting patient to take care of scheduling this appt for patient.

## 2014-07-04 ENCOUNTER — Institutional Professional Consult (permissible substitution): Payer: 59 | Admitting: Neurology

## 2014-07-30 ENCOUNTER — Ambulatory Visit (INDEPENDENT_AMBULATORY_CARE_PROVIDER_SITE_OTHER): Payer: Medicare Other | Admitting: Family Medicine

## 2014-07-30 VITALS — BP 88/52 | HR 76 | Temp 97.6°F | Resp 16 | Ht 68.0 in | Wt 174.8 lb

## 2014-07-30 DIAGNOSIS — J069 Acute upper respiratory infection, unspecified: Secondary | ICD-10-CM

## 2014-07-30 DIAGNOSIS — G7000 Myasthenia gravis without (acute) exacerbation: Secondary | ICD-10-CM

## 2014-07-30 NOTE — Progress Notes (Signed)
Subjective: 81 year old man from [place] who has had a respiratory tract infection for last several days. He was concerned and wanted to get checked. He's noticed he is hoarse her. He coughs a little bit. He has been concerned he might do a pneumonia. He has had a tough time the last few years. He developed myasthenia gravis. The steroids have caused him to have a lot of swelling and weight gain. He is not diabetic from it however. He is on a number of medications, but there are many medications he cannot take. He has atrial fibrillation and is on anticoagulant therapy. He does not smoke. He has been here for 3 weeks visiting his daughter, and is headed home in the next couple of days to is to [place] and can see his physician when he needs to down there.  Objective: His face has a steroid puffiness to it. Left medial has a sub-conjunctival hemorrhage. His TMs are normal. Throat clear. Neck supple without significant nodes. Chest clear. Heart actually sounded fairly regular despite his history of atrial fibrillation. No ankle edema.  Assessment: Upper respiratory infection Left sub-conjunctival hemorrhage Myasthenia gravis Steroid dependence  Plan: His comorbidities make it difficult to treat with even symptomatic medications. I asked him to increase his fluticasone to 2 sprays each nostril daily. Continue his Astelin. Take OTC Mucinex plain to help thin the mucus. Return when necessary or see his primary care doctor.

## 2014-07-30 NOTE — Patient Instructions (Signed)
Drink plenty of fluids and get enough rest  Increase the Flonase (fluticasone) to 2 sprays each night  Continue the other medications and same fashion  Take some Mucinex to help keep the secretions thin  If you're secretions are getting more green or yellow and thicker, or if you're feeling worse at anytime, get rechecked.  Return as needed

## 2014-08-12 ENCOUNTER — Ambulatory Visit (INDEPENDENT_AMBULATORY_CARE_PROVIDER_SITE_OTHER): Payer: Medicare Other | Admitting: Internal Medicine

## 2014-08-12 ENCOUNTER — Institutional Professional Consult (permissible substitution): Payer: 59 | Admitting: Internal Medicine

## 2014-08-12 ENCOUNTER — Encounter: Payer: Self-pay | Admitting: Internal Medicine

## 2014-08-12 VITALS — BP 98/60 | HR 93 | Ht 70.0 in | Wt 170.0 lb

## 2014-08-12 DIAGNOSIS — G4700 Insomnia, unspecified: Secondary | ICD-10-CM

## 2014-08-12 MED ORDER — CLONAZEPAM 0.5 MG PO TABS: ORAL_TABLET | ORAL | Status: DC

## 2014-08-12 NOTE — Assessment & Plan Note (Signed)
His primary complaint is that he wakes after sleep onset and can't regain sleep. There has been no observation of significant snoring or sleep related breathing disorder according to the family. There is a long-standing pattern of movement during sleep which sounds like REM Behavior Disorder without aggressive punching of his wife. He is less aware of any periodic limb movement patterns, or restless leg while awake. He denies history of Parkinson's. His sleepiness when he sits in the daytime may be a primary disorder but may be secondary to sleep disturbance at night. I think the potential benefit from a NPSG would not be worth the logistical disturbances for family and this 81 year old man, at least until we see how he responds to medical intervention. He has previous experience with clonazepam which can work nicely for REM behavior disorder and also help his insomnia. We discussed this carefully, including attention to the long half-life. Plan-single dose clonazepam taken one half hour before intended bedtime. Watch impact on sleep and movement patterns.

## 2014-08-12 NOTE — Patient Instructions (Signed)
Script for clonazepam Try this 1/2 hour before bedtime. See if this helps reduce the restlessness and insomnia at night

## 2014-08-12 NOTE — Progress Notes (Signed)
08/12/14- 81 yo M never smoker  Pt referred by Dr. Osvaldo [facility], who follows for Myasthenia,  for restlessness and unable to fall back asleep during the night. Pt wears nocturnal O2 as need, 2lpm/ Advanced. EPWORTH- 69 Daughter [name] is here Saw Dr Lamonte Sakai here previously  for -, hx of A fib, myasthenia gravis (dx 11/12) followed by Dr Jacelyn Grip and on Cellsept, prednisone, mestinon. CT scan of the chest with some bibasilar scar vs plaquing - he states that he has had these followed for over 50 years. He was referred to evaluate SOB. I had seen Mr. Crompton here several years ago for dyspnea which responded nicely to Lasix. Since around the time of diagnosis of myasthenia 3 years ago, he complains that he falls asleep easily but then wakes several times during the night and can't go back to sleep for an hour at a time. He sits up to read or watch TV. Family describes restless activity, talking, reaching, moving his legs such that he scratches himself with toenails. There is no sleepwalking, punching or complex behavior. There is no history of CVA or seizure. During the day he doses off anytime he sits quietly.. They deny significant snoring. His daughter has watched him., He takes hydrocodone once daily for back pain. Klonopin and Ambien were on his medication list but have not been taken in a long time. Family is gradually transitioning his care and home to this area where he can be supported by his daughter.  Prior to Admission medications   Medication Sig Start Date End Date Taking? Authorizing Provider  calcium citrate-vitamin D (CITRACAL+D) 315-200 MG-UNIT per tablet Take 1 tablet by mouth 2 (two) times daily.   Yes Historical Provider, MD  fluticasone (FLONASE) 50 MCG/ACT nasal spray Place 2 sprays into both nostrils daily as needed for allergies or rhinitis.   Yes Historical Provider, MD  furosemide (LASIX) 20 MG tablet Take 20 mg by mouth.   Yes Historical Provider, MD  HYDROcodone-acetaminophen  (NORCO) 10-325 MG per tablet Take 1 tablet by mouth every 6 (six) hours as needed.   Yes Historical Provider, MD  metoprolol (LOPRESSOR) 50 MG tablet Take 50 mg by mouth 3 (three) times daily.    Yes Historical Provider, MD  mometasone (NASONEX) 50 MCG/ACT nasal spray Place 2 sprays into the nose daily as needed.   Yes Historical Provider, MD  omeprazole (PRILOSEC) 20 MG capsule Take 1 capsule (20 mg total) by mouth daily. 04/30/12  Yes Tammy S Parrett, NP  predniSONE (DELTASONE) 10 MG tablet 3 tablets by mouth daily for total of 30mg  Patient taking differently: 15 mg.  12/27/11  Yes Clearnce Sorrel, MD  vitamin B-12 (CYANOCOBALAMIN) 1000 MCG tablet Take 1,000 mcg by mouth daily.   Yes Historical Provider, MD  warfarin (COUMADIN) 5 MG tablet Take 5 mg by mouth daily.    Yes Historical Provider, MD  clonazePAM (KLONOPIN) 0.5 MG tablet 1 for sleep taken 1/2 hour before bedtime 08/12/14   Deneise Lever, MD  furosemide (LASIX) 20 MG tablet Take 1 tablet (20 mg total) by mouth daily. As needed diuretic 04/30/12 04/30/13  Melvenia Needles, NP   Past Medical History  Diagnosis Date  . Kidney stones   . Urine incontinence   . Atrial fibrillation   . Osteoarthritis   . Prostate cancer dx'd 1982  . Myasthenia gravis    Past Surgical History  Procedure Laterality Date  . Prostate surgery    . Hernia repair    .  Total knee arthroplasty    . Rotator cuff repair    . Appendectomy     Family History  Problem Relation Age of Onset  . Heart disease Father   . Heart disease Brother   . Heart disease Paternal Grandfather    History   Social History  . Marital Status: Married    Spouse Name: N/A    Number of Children: 2  . Years of Education: N/A   Occupational History  .      magistrate   Social History Main Topics  . Smoking status: Never Smoker   . Smokeless tobacco: Never Used  . Alcohol Use: No     Comment: none  . Drug Use: No  . Sexual Activity: Not on file   Other Topics Concern   . Not on file   Social History Narrative   ROS-see HPI Constitutional:   No-   weight loss, night sweats, fevers, chills, fatigue, lassitude. HEENT:   No-  headaches, difficulty swallowing, tooth/dental problems, sore throat,       No-  sneezing, itching, ear ache, nasal congestion, post nasal drip,  CV:  No-   chest pain, orthopnea, PND, swelling in lower extremities, anasarca,                                  dizziness, +palpitations Resp: + shortness of breath with exertion or at rest.              No-   productive cough,  +non-productive cough,  No- coughing up of blood.              No-   change in color of mucus.  No- wheezing.   Skin: No-   rash or lesions. GI:  No-   heartburn, indigestion, abdominal pain, nausea, vomiting, diarrhea,                 change in bowel habits, loss of appetite GU: No-   dysuria, change in color of urine, no urgency or frequency.  No- flank pain. MS:  No-   joint pain or swelling.  No- decreased range of motion.  No- back pain. Neuro-     nothing unusual Psych:  No- change in mood or affect. No depression or anxiety.  No memory loss.  OBJ- Physical Exam General- Alert, Oriented, Affect-appropriate, Distress- none acute, +overweight Skin- rash-none, lesions- none, excoriation- none Lymphadenopathy- none Head- atraumatic            Eyes- Gross vision intact, PERRLA, conjunctivae and secretions clear            Ears- Hearing, canals-normal            Nose- Clear, no-Septal dev, mucus, polyps, erosion, perforation             Throat- Mallampati II , mucosa clear , drainage- none, tonsils- atrophic Neck- flexible , trachea midline, no stridor , thyroid nl, carotid no bruit Chest - symmetrical excursion , unlabored           Heart/CV- IRR/ no pacemaker , no murmur , no gallop  , no rub, nl s1 s2                           - JVD- none , edema- none, stasis changes- none, varices- none  Lung- clear to P&A, wheeze- none, cough- none ,  dullness-none, rub- none           Chest wall-  Abd- tender-no, distended-no, bowel sounds-present, HSM- no Br/ Gen/ Rectal- Not done, not indicated Extrem- cyanosis- none, clubbing, none, atrophy- none, strength- nl,   +contractures fingers L hand Neuro- grossly intact to observation

## 2014-09-21 ENCOUNTER — Ambulatory Visit: Payer: Medicare Other | Admitting: General Practice

## 2014-09-21 DIAGNOSIS — Z5181 Encounter for therapeutic drug level monitoring: Secondary | ICD-10-CM

## 2014-10-12 ENCOUNTER — Inpatient Hospital Stay (HOSPITAL_COMMUNITY): Payer: Medicare Other | Admitting: Anesthesiology

## 2014-10-12 ENCOUNTER — Encounter (HOSPITAL_COMMUNITY): Payer: Self-pay | Admitting: Family Medicine

## 2014-10-12 ENCOUNTER — Emergency Department (HOSPITAL_COMMUNITY): Payer: Medicare Other

## 2014-10-12 ENCOUNTER — Inpatient Hospital Stay (HOSPITAL_COMMUNITY)
Admission: EM | Admit: 2014-10-12 | Discharge: 2014-10-14 | DRG: 854 | Disposition: A | Discharge status: Home / Self Care | Payer: Medicare Other | Attending: Internal Medicine | Admitting: Internal Medicine

## 2014-10-12 ENCOUNTER — Encounter (HOSPITAL_COMMUNITY)
Admission: EM | Disposition: A | Discharge status: Home / Self Care | Payer: Self-pay | Source: Home / Self Care | Attending: Internal Medicine

## 2014-10-12 DIAGNOSIS — I482 Chronic atrial fibrillation, unspecified: Secondary | ICD-10-CM | POA: Diagnosis present

## 2014-10-12 DIAGNOSIS — G7000 Myasthenia gravis without (acute) exacerbation: Secondary | ICD-10-CM | POA: Diagnosis present

## 2014-10-12 DIAGNOSIS — C61 Malignant neoplasm of prostate: Secondary | ICD-10-CM

## 2014-10-12 DIAGNOSIS — Z96659 Presence of unspecified artificial knee joint: Secondary | ICD-10-CM | POA: Diagnosis present

## 2014-10-12 DIAGNOSIS — E876 Hypokalemia: Secondary | ICD-10-CM | POA: Diagnosis present

## 2014-10-12 DIAGNOSIS — A419 Sepsis, unspecified organism: Secondary | ICD-10-CM | POA: Diagnosis present

## 2014-10-12 DIAGNOSIS — L03114 Cellulitis of left upper limb: Secondary | ICD-10-CM | POA: Diagnosis present

## 2014-10-12 DIAGNOSIS — Z8546 Personal history of malignant neoplasm of prostate: Secondary | ICD-10-CM

## 2014-10-12 DIAGNOSIS — Z7952 Long term (current) use of systemic steroids: Secondary | ICD-10-CM

## 2014-10-12 DIAGNOSIS — F419 Anxiety disorder, unspecified: Secondary | ICD-10-CM | POA: Diagnosis present

## 2014-10-12 DIAGNOSIS — M009 Pyogenic arthritis, unspecified: Secondary | ICD-10-CM | POA: Diagnosis present

## 2014-10-12 DIAGNOSIS — K219 Gastro-esophageal reflux disease without esophagitis: Secondary | ICD-10-CM | POA: Diagnosis present

## 2014-10-12 DIAGNOSIS — M1990 Unspecified osteoarthritis, unspecified site: Secondary | ICD-10-CM | POA: Diagnosis present

## 2014-10-12 DIAGNOSIS — M25442 Effusion, left hand: Secondary | ICD-10-CM

## 2014-10-12 DIAGNOSIS — Z7901 Long term (current) use of anticoagulants: Secondary | ICD-10-CM | POA: Diagnosis not present

## 2014-10-12 DIAGNOSIS — J929 Pleural plaque without asbestos: Secondary | ICD-10-CM

## 2014-10-12 DIAGNOSIS — B9561 Methicillin susceptible Staphylococcus aureus infection as the cause of diseases classified elsewhere: Secondary | ICD-10-CM | POA: Diagnosis present

## 2014-10-12 HISTORY — PX: I & D EXTREMITY: SHX5045

## 2014-10-12 HISTORY — DX: Cardiac arrhythmia, unspecified: I49.9

## 2014-10-12 HISTORY — DX: Cellulitis, unspecified: L03.90

## 2014-10-12 LAB — CBC WITH DIFFERENTIAL/PLATELET
BASOS ABS: 0 10*3/uL (ref 0.0–0.1)
BASOS PCT: 0 % (ref 0–1)
Eosinophils Absolute: 0 10*3/uL (ref 0.0–0.7)
Eosinophils Relative: 0 % (ref 0–5)
HCT: 39 % (ref 39.0–52.0)
Hemoglobin: 12.8 g/dL — ABNORMAL LOW (ref 13.0–17.0)
Lymphocytes Relative: 9 % — ABNORMAL LOW (ref 12–46)
Lymphs Abs: 0.9 10*3/uL (ref 0.7–4.0)
MCH: 33.1 pg (ref 26.0–34.0)
MCHC: 32.8 g/dL (ref 30.0–36.0)
MCV: 100.8 fL — ABNORMAL HIGH (ref 78.0–100.0)
Monocytes Absolute: 0.8 10*3/uL (ref 0.1–1.0)
Monocytes Relative: 7 % (ref 3–12)
NEUTROS ABS: 8.9 10*3/uL — AB (ref 1.7–7.7)
Neutrophils Relative %: 84 % — ABNORMAL HIGH (ref 43–77)
PLATELETS: 154 10*3/uL (ref 150–400)
RBC: 3.87 MIL/uL — ABNORMAL LOW (ref 4.22–5.81)
RDW: 14 % (ref 11.5–15.5)
WBC: 10.6 10*3/uL — AB (ref 4.0–10.5)

## 2014-10-12 LAB — BASIC METABOLIC PANEL
Anion gap: 9 (ref 5–15)
BUN: 15 mg/dL (ref 6–23)
CALCIUM: 8.1 mg/dL — AB (ref 8.4–10.5)
CO2: 23 mmol/L (ref 19–32)
Chloride: 108 mmol/L (ref 96–112)
Creatinine, Ser: 1.05 mg/dL (ref 0.50–1.35)
GFR calc Af Amer: 72 mL/min — ABNORMAL LOW (ref >90)
GFR calc non Af Amer: 62 mL/min — ABNORMAL LOW (ref >90)
GLUCOSE: 125 mg/dL — AB (ref 70–99)
Potassium: 3.1 mmol/L — ABNORMAL LOW (ref 3.5–5.1)
Sodium: 140 mmol/L (ref 135–145)

## 2014-10-12 LAB — LACTIC ACID, PLASMA
LACTIC ACID, VENOUS: 1.3 mmol/L (ref 0.5–2.0)
Lactic Acid, Venous: 3 mmol/L (ref 0.5–2.0)

## 2014-10-12 LAB — C-REACTIVE PROTEIN: CRP: 14 mg/dL — AB (ref <0.60)

## 2014-10-12 LAB — SEDIMENTATION RATE: Sed Rate: 29 mm/hr — ABNORMAL HIGH (ref 0–16)

## 2014-10-12 LAB — SURGICAL PCR SCREEN
MRSA, PCR: NEGATIVE
STAPHYLOCOCCUS AUREUS: POSITIVE — AB

## 2014-10-12 LAB — GLUCOSE, CAPILLARY
Glucose-Capillary: 151 mg/dL — ABNORMAL HIGH (ref 70–99)
Glucose-Capillary: 91 mg/dL (ref 70–99)

## 2014-10-12 LAB — PROTIME-INR
INR: 2.7 — AB (ref 0.00–1.49)
PROTHROMBIN TIME: 28.9 s — AB (ref 11.6–15.2)

## 2014-10-12 SURGERY — IRRIGATION AND DEBRIDEMENT EXTREMITY
Anesthesia: General | Site: Hand | Laterality: Left

## 2014-10-12 MED ORDER — SODIUM CHLORIDE 0.9 % IR SOLN
Status: DC | PRN
  Administered 2014-10-12: 1000 mL

## 2014-10-12 MED ORDER — SODIUM CHLORIDE 0.9 % IV BOLUS (SEPSIS)
500.0000 mL | Freq: Once | INTRAVENOUS | Status: AC
  Administered 2014-10-12: 500 mL via INTRAVENOUS

## 2014-10-12 MED ORDER — SODIUM CHLORIDE 0.9 % IR SOLN
Status: DC | PRN
  Administered 2014-10-12: 3000 mL

## 2014-10-12 MED ORDER — LIDOCAINE HCL (CARDIAC) 20 MG/ML IV SOLN
INTRAVENOUS | Status: AC
  Filled 2014-10-12: qty 5

## 2014-10-12 MED ORDER — BUPIVACAINE HCL (PF) 0.25 % IJ SOLN
INTRAMUSCULAR | Status: AC
  Filled 2014-10-12: qty 30

## 2014-10-12 MED ORDER — INSULIN ASPART 100 UNIT/ML ~~LOC~~ SOLN
0.0000 [IU] | Freq: Three times a day (TID) | SUBCUTANEOUS | Status: DC
Start: 2014-10-12 — End: 2014-10-14
  Administered 2014-10-12: 2 [IU] via SUBCUTANEOUS
  Administered 2014-10-13 – 2014-10-14 (×4): 1 [IU] via SUBCUTANEOUS

## 2014-10-12 MED ORDER — VANCOMYCIN HCL 10 G IV SOLR
1500.0000 mg | Freq: Once | INTRAVENOUS | Status: AC
  Administered 2014-10-12: 1500 mg via INTRAVENOUS
  Filled 2014-10-12: qty 1500

## 2014-10-12 MED ORDER — CHLORHEXIDINE GLUCONATE 4 % EX LIQD
60.0000 mL | Freq: Once | CUTANEOUS | Status: AC
  Administered 2014-10-12: 4 via TOPICAL
  Filled 2014-10-12: qty 60

## 2014-10-12 MED ORDER — PANTOPRAZOLE SODIUM 40 MG PO TBEC
40.0000 mg | DELAYED_RELEASE_TABLET | Freq: Every day | ORAL | Status: DC
  Administered 2014-10-13 – 2014-10-14 (×2): 40 mg via ORAL
  Filled 2014-10-12: qty 1

## 2014-10-12 MED ORDER — ALUM & MAG HYDROXIDE-SIMETH 200-200-20 MG/5ML PO SUSP: 30.0000 mL | Freq: Four times a day (QID) | ORAL | Status: DC | PRN

## 2014-10-12 MED ORDER — GUAIFENESIN-DM 100-10 MG/5ML PO SYRP
5.0000 mL | ORAL_SOLUTION | ORAL | Status: DC | PRN
Start: 2014-10-12 — End: 2014-10-14
  Administered 2014-10-14: 5 mL via ORAL
  Filled 2014-10-12 (×2): qty 5

## 2014-10-12 MED ORDER — PROPOFOL 10 MG/ML IV BOLUS
INTRAVENOUS | Status: AC
  Filled 2014-10-12: qty 20

## 2014-10-12 MED ORDER — POLYETHYLENE GLYCOL 3350 17 G PO PACK
17.0000 g | PACK | Freq: Every day | ORAL | Status: DC
  Administered 2014-10-12 – 2014-10-14 (×3): 17 g via ORAL
  Filled 2014-10-12 (×3): qty 1

## 2014-10-12 MED ORDER — POTASSIUM CHLORIDE IN NACL 40-0.9 MEQ/L-% IV SOLN
INTRAVENOUS | Status: AC
  Administered 2014-10-12 – 2014-10-13 (×2): 75 mL/h via INTRAVENOUS
  Filled 2014-10-12 (×2): qty 1000

## 2014-10-12 MED ORDER — VITAMIN B-12 1000 MCG PO TABS
1000.0000 ug | ORAL_TABLET | Freq: Every day | ORAL | Status: DC
  Administered 2014-10-13 – 2014-10-14 (×2): 1000 ug via ORAL
  Filled 2014-10-12 (×2): qty 1

## 2014-10-12 MED ORDER — SODIUM CHLORIDE 0.9 % IJ SOLN
INTRAMUSCULAR | Status: AC
  Filled 2014-10-12: qty 10

## 2014-10-12 MED ORDER — METOPROLOL TARTRATE 1 MG/ML IV SOLN
INTRAVENOUS | Status: DC | PRN
  Administered 2014-10-12: 2 mg via INTRAVENOUS
  Administered 2014-10-12: 1 mg via INTRAVENOUS
  Administered 2014-10-12: 2 mg via INTRAVENOUS

## 2014-10-12 MED ORDER — CALCIUM CARBONATE-VITAMIN D 500-200 MG-UNIT PO TABS
1.0000 | ORAL_TABLET | Freq: Two times a day (BID) | ORAL | Status: DC
  Administered 2014-10-12 – 2014-10-14 (×5): 1 via ORAL
  Filled 2014-10-12 (×6): qty 1

## 2014-10-12 MED ORDER — VANCOMYCIN HCL 10 G IV SOLR
1250.0000 mg | INTRAVENOUS | Status: DC
  Filled 2014-10-12: qty 1250

## 2014-10-12 MED ORDER — EPHEDRINE SULFATE 50 MG/ML IJ SOLN
INTRAMUSCULAR | Status: AC
  Filled 2014-10-12: qty 1

## 2014-10-12 MED ORDER — FENTANYL CITRATE 0.05 MG/ML IJ SOLN
INTRAMUSCULAR | Status: AC
Start: 2014-10-12 — End: 2014-10-12
  Filled 2014-10-12: qty 5

## 2014-10-12 MED ORDER — POTASSIUM CHLORIDE CRYS ER 20 MEQ PO TBCR
40.0000 meq | EXTENDED_RELEASE_TABLET | Freq: Once | ORAL | Status: AC
  Administered 2014-10-12: 40 meq via ORAL
  Filled 2014-10-12: qty 2

## 2014-10-12 MED ORDER — CLONAZEPAM 0.5 MG PO TABS
0.2500 mg | ORAL_TABLET | Freq: Every day | ORAL | Status: DC
  Administered 2014-10-13: 0.25 mg via ORAL
  Filled 2014-10-12 (×2): qty 1

## 2014-10-12 MED ORDER — FLUTICASONE PROPIONATE 50 MCG/ACT NA SUSP
2.0000 | Freq: Every day | NASAL | Status: DC
  Administered 2014-10-13 – 2014-10-14 (×2): 2 via NASAL
  Filled 2014-10-12: qty 16

## 2014-10-12 MED ORDER — WARFARIN - PHARMACIST DOSING INPATIENT
Freq: Every day | Status: DC
  Administered 2014-10-12 – 2014-10-14 (×2)

## 2014-10-12 MED ORDER — ONDANSETRON HCL 4 MG PO TABS: 4.0000 mg | ORAL_TABLET | Freq: Four times a day (QID) | ORAL | Status: DC | PRN

## 2014-10-12 MED ORDER — PREDNISONE 20 MG PO TABS
20.0000 mg | ORAL_TABLET | Freq: Every day | ORAL | Status: DC
  Administered 2014-10-13 – 2014-10-14 (×2): 20 mg via ORAL
  Filled 2014-10-12 (×3): qty 1

## 2014-10-12 MED ORDER — FLUTICASONE PROPIONATE 50 MCG/ACT NA SUSP
2.0000 | Freq: Every day | NASAL | Status: DC | PRN
  Filled 2014-10-12: qty 16

## 2014-10-12 MED ORDER — LACTATED RINGERS IV SOLN
INTRAVENOUS | Status: DC | PRN
  Administered 2014-10-12: 20:00:00 via INTRAVENOUS

## 2014-10-12 MED ORDER — METOPROLOL TARTRATE 50 MG PO TABS
50.0000 mg | ORAL_TABLET | Freq: Two times a day (BID) | ORAL | Status: DC
  Administered 2014-10-12 – 2014-10-14 (×4): 50 mg via ORAL
  Filled 2014-10-12 (×5): qty 1

## 2014-10-12 MED ORDER — SODIUM CHLORIDE 0.9 % IJ SOLN
3.0000 mL | Freq: Two times a day (BID) | INTRAMUSCULAR | Status: DC
  Administered 2014-10-13 (×2): 3 mL via INTRAVENOUS

## 2014-10-12 MED ORDER — METOPROLOL TARTRATE 1 MG/ML IV SOLN
INTRAVENOUS | Status: AC
  Filled 2014-10-12: qty 5

## 2014-10-12 MED ORDER — HYDROCODONE-ACETAMINOPHEN 5-325 MG PO TABS: 1.0000 | ORAL_TABLET | ORAL | Status: DC | PRN

## 2014-10-12 MED ORDER — WARFARIN SODIUM 5 MG PO TABS
5.0000 mg | ORAL_TABLET | Freq: Once | ORAL | Status: AC
  Administered 2014-10-12: 5 mg via ORAL
  Filled 2014-10-12: qty 1

## 2014-10-12 MED ORDER — ONDANSETRON HCL 4 MG/2ML IJ SOLN: 4.0000 mg | Freq: Four times a day (QID) | INTRAMUSCULAR | Status: DC | PRN

## 2014-10-12 MED ORDER — CALCIUM CITRATE-VITAMIN D 315-200 MG-UNIT PO TABS: 1.0000 | ORAL_TABLET | Freq: Two times a day (BID) | ORAL | Status: DC

## 2014-10-12 MED ORDER — SUCCINYLCHOLINE CHLORIDE 20 MG/ML IJ SOLN
INTRAMUSCULAR | Status: AC
  Filled 2014-10-12: qty 1

## 2014-10-12 SURGICAL SUPPLY — 59 items
BANDAGE ELASTIC 3 VELCRO ST LF (GAUZE/BANDAGES/DRESSINGS) ×3 IMPLANT
BANDAGE ELASTIC 4 VELCRO ST LF (GAUZE/BANDAGES/DRESSINGS) IMPLANT
BNDG COHESIVE 1X5 TAN STRL LF (GAUZE/BANDAGES/DRESSINGS) IMPLANT
BNDG CONFORM 2 STRL LF (GAUZE/BANDAGES/DRESSINGS) IMPLANT
BNDG ESMARK 4X9 LF (GAUZE/BANDAGES/DRESSINGS) ×3 IMPLANT
BNDG GAUZE ELAST 4 BULKY (GAUZE/BANDAGES/DRESSINGS) ×3 IMPLANT
CORDS BIPOLAR (ELECTRODE) ×3 IMPLANT
COVER SURGICAL LIGHT HANDLE (MISCELLANEOUS) ×3 IMPLANT
CUFF TOURNIQUET SINGLE 18IN (TOURNIQUET CUFF) ×3 IMPLANT
CUFF TOURNIQUET SINGLE 24IN (TOURNIQUET CUFF) IMPLANT
DRAIN PENROSE 1/4X12 LTX STRL (WOUND CARE) IMPLANT
DRAPE SURG 17X23 STRL (DRAPES) ×3 IMPLANT
DRSG ADAPTIC 3X8 NADH LF (GAUZE/BANDAGES/DRESSINGS) IMPLANT
ELECT REM PT RETURN 9FT ADLT (ELECTROSURGICAL)
ELECTRODE REM PT RTRN 9FT ADLT (ELECTROSURGICAL) IMPLANT
GAUZE SPONGE 4X4 12PLY STRL (GAUZE/BANDAGES/DRESSINGS) ×3 IMPLANT
GAUZE XEROFORM 1X8 LF (GAUZE/BANDAGES/DRESSINGS) ×3 IMPLANT
GAUZE XEROFORM 5X9 LF (GAUZE/BANDAGES/DRESSINGS) IMPLANT
GLOVE BIOGEL PI IND STRL 8.5 (GLOVE) ×1 IMPLANT
GLOVE BIOGEL PI INDICATOR 8.5 (GLOVE) ×2
GLOVE BIOGEL PI ORTHO PRO SZ7 (GLOVE) ×2
GLOVE PI ORTHO PRO STRL SZ7 (GLOVE) ×1 IMPLANT
GLOVE SURG ORTHO 8.0 STRL STRW (GLOVE) ×3 IMPLANT
GLOVE SURG SS PI 6.5 STRL IVOR (GLOVE) ×3 IMPLANT
GOWN STRL REUS W/ TWL LRG LVL3 (GOWN DISPOSABLE) ×2 IMPLANT
GOWN STRL REUS W/ TWL XL LVL3 (GOWN DISPOSABLE) ×1 IMPLANT
GOWN STRL REUS W/TWL LRG LVL3 (GOWN DISPOSABLE) ×4
GOWN STRL REUS W/TWL XL LVL3 (GOWN DISPOSABLE) ×2
HANDPIECE INTERPULSE COAX TIP (DISPOSABLE)
KIT BASIN OR (CUSTOM PROCEDURE TRAY) ×3 IMPLANT
KIT ROOM TURNOVER OR (KITS) ×3 IMPLANT
MANIFOLD NEPTUNE II (INSTRUMENTS) ×3 IMPLANT
NEEDLE HYPO 25GX1X1/2 BEV (NEEDLE) IMPLANT
NS IRRIG 1000ML POUR BTL (IV SOLUTION) ×3 IMPLANT
PACK ORTHO EXTREMITY (CUSTOM PROCEDURE TRAY) ×3 IMPLANT
PAD ARMBOARD 7.5X6 YLW CONV (MISCELLANEOUS) ×3 IMPLANT
PAD CAST 3X4 CTTN HI CHSV (CAST SUPPLIES) ×1 IMPLANT
PAD CAST 4YDX4 CTTN HI CHSV (CAST SUPPLIES) IMPLANT
PADDING CAST COTTON 3X4 STRL (CAST SUPPLIES) ×2
PADDING CAST COTTON 4X4 STRL (CAST SUPPLIES)
SET HNDPC FAN SPRY TIP SCT (DISPOSABLE) IMPLANT
SOAP 2 % CHG 4 OZ (WOUND CARE) ×3 IMPLANT
SPONGE LAP 18X18 X RAY DECT (DISPOSABLE) IMPLANT
SPONGE LAP 4X18 X RAY DECT (DISPOSABLE) IMPLANT
SUCTION FRAZIER TIP 10 FR DISP (SUCTIONS) IMPLANT
SUT ETHILON 4 0 PS 2 18 (SUTURE) IMPLANT
SUT ETHILON 5 0 P 3 18 (SUTURE)
SUT NYLON ETHILON 5-0 P-3 1X18 (SUTURE) IMPLANT
SUT PROLENE 3 0 PS 2 (SUTURE) ×3 IMPLANT
SWAB COLLECTION DEVICE MRSA (MISCELLANEOUS) ×3 IMPLANT
SYR CONTROL 10ML LL (SYRINGE) IMPLANT
TOWEL OR 17X24 6PK STRL BLUE (TOWEL DISPOSABLE) ×3 IMPLANT
TOWEL OR 17X26 10 PK STRL BLUE (TOWEL DISPOSABLE) ×3 IMPLANT
TUBE ANAEROBIC SPECIMEN COL (MISCELLANEOUS) ×3 IMPLANT
TUBE CONNECTING 12'X1/4 (SUCTIONS) ×1
TUBE CONNECTING 12X1/4 (SUCTIONS) ×2 IMPLANT
TUBING CYSTO DISP (UROLOGICAL SUPPLIES) ×3 IMPLANT
UNDERPAD 30X30 INCONTINENT (UNDERPADS AND DIAPERS) ×3 IMPLANT
YANKAUER SUCT BULB TIP NO VENT (SUCTIONS) ×3 IMPLANT

## 2014-10-12 NOTE — ED Provider Notes (Signed)
CSN: [id number]     Arrival date & time 10/12/14  8841 History   First MD Initiated Contact with Patient 10/12/14 [phone]     Chief Complaint  Patient presents with  . Cellulitis     (Consider location/radiation/quality/duration/timing/severity/associated sxs/prior Treatment) HPI Comments: Richard Bradley is a 81 y.o. male with a PMHx of myasthenia gravis, Afib on anticoagulation, remote prostate CA s/p surgical excision and subsequent urinary incontinence, osteoarthritis, and nephrolithiasis, who presents to the ED with complaints of left hand swelling, pain, and erythema 2 days. Patient's daughter reports that he saw Dr. Alfonso Ramus at [facility] on Monday for swelling to the second MCP joint, was given a shot and pills of antibiotics, and continued on Keflex for concerns of cellulitis. Patient reports that yesterday the area was less swollen but today the erythema has spread to the midhumerus region. Reports the swelling and pain overall has improved. Pain is 6/10 soreness across his knuckles of the left hand, radiating into forearm, constant, worse with movement, and unrelieved with his home hydrocodone. He endorses warmth to the entire erythematous region. He is on chronic prednisone 15 mg daily for myasthenia gravis. He denies any known skin injury, drainage, or red streaking. Denies any fevers, chills, chest pain, shortness breath, abdominal pain, nausea, vomiting, diarrhea, dysuria, hematuria, leg swelling, numbness, tingling,, history of gout, or history of DVT/PE. He is on Coumadin and is compliant with all medications.  Patient is a 81 y.o. male presenting with extremity pain. The history is provided by the patient and a relative. No language interpreter was used.  Extremity Pain This is a new problem. The current episode started in the past 7 days. The problem occurs constantly. The problem has been unchanged. Associated symptoms include arthralgias (L hand) and joint swelling (L 2nd MCP joint).  Pertinent negatives include no abdominal pain, chest pain, chills, fever, myalgias, nausea, numbness, vomiting or weakness. Exacerbated by: movement of hand. He has tried oral narcotics for the symptoms. The treatment provided no relief.    Past Medical History  Diagnosis Date  . Kidney stones   . Urine incontinence   . Atrial fibrillation   . Osteoarthritis   . Prostate cancer dx'd 1982  . Myasthenia gravis    Past Surgical History  Procedure Laterality Date  . Prostate surgery    . Hernia repair    . Total knee arthroplasty    . Rotator cuff repair    . Appendectomy     Family History  Problem Relation Age of Onset  . Heart disease Father   . Heart disease Brother   . Heart disease Paternal Grandfather    History  Substance Use Topics  . Smoking status: Never Smoker   . Smokeless tobacco: Never Used  . Alcohol Use: No     Comment: none    Review of Systems  Constitutional: Negative for fever and chills.  Respiratory: Negative for shortness of breath.   Cardiovascular: Negative for chest pain and leg swelling.  Gastrointestinal: Negative for nausea, vomiting, abdominal pain and diarrhea.  Genitourinary: Negative for dysuria and hematuria.  Musculoskeletal: Positive for joint swelling (L 2nd MCP joint) and arthralgias (L hand). Negative for myalgias.  Skin: Positive for color change (erythema of L arm). Negative for wound.  Allergic/Immunologic: Positive for immunocompromised state (chronic prednisone).  Neurological: Negative for weakness and numbness.  Psychiatric/Behavioral: Negative for confusion.   10 Systems reviewed and are negative for acute change except as noted in the HPI.  Allergies  Review of patient's allergies indicates no known allergies.  Home Medications   Prior to Admission medications   Medication Sig Start Date End Date Taking? Authorizing Provider  calcium citrate-vitamin D (CITRACAL+D) 315-200 MG-UNIT per tablet Take 1 tablet by mouth 2  (two) times daily.    Historical Provider, MD  clonazePAM (KLONOPIN) 0.5 MG tablet 1 for sleep taken 1/2 hour before bedtime 08/12/14   Deneise Lever, MD  fluticasone [facility]) 50 MCG/ACT nasal spray Place 2 sprays into both nostrils daily as needed for allergies or rhinitis.    Historical Provider, MD  furosemide (LASIX) 20 MG tablet Take 1 tablet (20 mg total) by mouth daily. As needed diuretic 04/30/12 04/30/13  Tammy S Parrett, NP  furosemide (LASIX) 20 MG tablet Take 20 mg by mouth.    Historical Provider, MD  HYDROcodone-acetaminophen (NORCO) 10-325 MG per tablet Take 1 tablet by mouth every 6 (six) hours as needed.    Historical Provider, MD  metoprolol (LOPRESSOR) 50 MG tablet Take 50 mg by mouth 3 (three) times daily.     Historical Provider, MD  mometasone (NASONEX) 50 MCG/ACT nasal spray Place 2 sprays into the nose daily as needed.    Historical Provider, MD  omeprazole (PRILOSEC) 20 MG capsule Take 1 capsule (20 mg total) by mouth daily. 04/30/12   Tammy S Parrett, NP  predniSONE (DELTASONE) 10 MG tablet 3 tablets by mouth daily for total of 45m Patient taking differently: 15 mg.  12/27/11   MClearnce Sorrel MD  vitamin B-12 (CYANOCOBALAMIN) 1000 MCG tablet Take 1,000 mcg by mouth daily.    Historical Provider, MD  warfarin (COUMADIN) 5 MG tablet Take 5 mg by mouth daily.     Historical Provider, MD   BP 104/63 mmHg  Pulse 98  Temp(Src) 98.8 F (37.1 C) (Oral)  Resp 18  SpO2 93% Physical Exam  Constitutional: He is oriented to person, place, and time. Vital signs are normal. He appears well-developed and well-nourished.  Non-toxic appearance. No distress.  Afebrile, nontoxic, NAD  HENT:  Head: Normocephalic and atraumatic.  Mouth/Throat: Oropharynx is clear and moist and mucous membranes are normal.  Eyes: Conjunctivae and EOM are normal. Right eye exhibits no discharge. Left eye exhibits no discharge.  Neck: Normal range of motion. Neck supple.  Cardiovascular: Normal rate,  regular rhythm, normal heart sounds and intact distal pulses.  Exam reveals no gallop and no friction rub.   No murmur heard. RRR, nl s1/s2, no m/r/g, distal pulses intact, no pedal edema   Pulmonary/Chest: Effort normal and breath sounds normal. No respiratory distress. He has no decreased breath sounds. He has no wheezes. He has no rhonchi. He has no rales.  Abdominal: Soft. Normal appearance and bowel sounds are normal. He exhibits no distension. There is no tenderness. There is no rigidity, no rebound and no guarding.  Musculoskeletal:       Arms:      Left hand: He exhibits decreased range of motion (slightly diminished due to pain and baseline arthritis), tenderness (at level of MCP joints), bony tenderness (at 2nd MCP joint) and swelling. He exhibits normal two-point discrimination and normal capillary refill. Normal sensation noted. Normal strength noted.       Hands: L hand with TTP over MCP joints diffusely along the dorsal surface, with swelling to 2nd MCP and some slight edema extending into dorsum but not past mid-hand. Erythema over entire hand and extending into mid-humerus, with warmth to the touch, no specific  skin injuries noted. Baseline arthritis deformities noted. Sensation grossly intact, strength at baseline. Distal pulses intact, cap refill brisk and present. Elbow and wrist with FROM intact and nonTTP.   Neurological: He is alert and oriented to person, place, and time. He has normal strength. No sensory deficit.  Skin: Skin is warm, dry and intact. No rash noted. There is erythema.  Skin intact with erythema to L arm as noted above  Psychiatric: He has a normal mood and affect.  Nursing note and vitals reviewed.   ED Course  Procedures (including critical care time) Labs Review Labs Reviewed  PROTIME-INR - Abnormal; Notable for the following:    Prothrombin Time 28.9 (*)    INR 2.70 (*)    All other components within normal limits  CBC WITH DIFFERENTIAL/PLATELET -  Abnormal; Notable for the following:    WBC 10.6 (*)    RBC 3.87 (*)    Hemoglobin 12.8 (*)    MCV 100.8 (*)    Neutrophils Relative % 84 (*)    Neutro Abs 8.9 (*)    Lymphocytes Relative 9 (*)    All other components within normal limits  BASIC METABOLIC PANEL - Abnormal; Notable for the following:    Potassium 3.1 (*)    Glucose, Bld 125 (*)    Calcium 8.1 (*)    GFR calc non Af Amer 62 (*)    GFR calc Af Amer 72 (*)    All other components within normal limits  CULTURE, BLOOD (ROUTINE X 2)  CULTURE, BLOOD (ROUTINE X 2)  SEDIMENTATION RATE  C-REACTIVE PROTEIN    Imaging Review Dg Hand Complete Left  10/12/2014   CLINICAL DATA:  Swelling of the left second and CP joint. No known injury. Evaluate for osteomyelitis versus septic joint.  EXAM: LEFT HAND - COMPLETE 3+ VIEW  COMPARISON:  None.  FINDINGS: No fracture or dislocation with special attention paid to the second digit. Joint spaces are preserved. No erosions.  There is mild apparent soft tissue swelling about the second MCP joint, best appreciated on lateral radiograph. This finding is without associated osteolysis, radiopaque foreign body or subcutaneous emphysema.  Scattered extensive vascular calcifications about the imaged distal forearm and hand.  IMPRESSION: Soft tissue swelling about the second MCP joint without associated fracture radiographic evidence of osteomyelitis.   Electronically Signed   By: Sandi Mariscal M.D.   On: 10/12/2014 09:52     EKG Interpretation None      MDM   Final diagnoses:  Swelling of hand joint, left  Cellulitis of left upper extremity    81 y.o. male with L hand swelling at 2nd MCP joint and erythema extending up to mid-upper arm, on oral antibiotics but on prednisone chronically for myasthenia gravis. No hx of gout previously, no hx of DVT. On chronic coumadin. Doubt DVT today, no significant edema other than on dorsum of MCP joints. Likely cellulitis not improving on oral therapies. Will  obtain basic labs, imaging, and proceed with admission once results return.   9:56 AM Xray neg for bony involvement or joint effusion, will proceed with hand consultation.  10:50 AM Dr. Caralyn Guile returning page for hand consultation, agrees with administering abx now, states that if medical team feels he needs to be involved in pt's care, to re-consult him at that time. CBC showing elevated WBC. All other labs still pending. Will consult for admission once labs return. Starting Vancomycin abx.   11:40 AM Labs all returning back, INR at  2.7, BMP with mildly low K, will replete orally, ESR and CRP still pending but will proceed with admission. Of note, BP slightly low at 80s/60s but pt and daughter state this is chronic. Also SpO2 high 80s-low 90s, pt asleep and has home oxygent PRN and needs it with sleeping. When he awakens, SpO2 rises to 95%. Will proceed with admission.   11:50 AM Dr. Candiss Norse returning page, will admit. Please see his dictation for further documentation of care.   Dewitt Hoes Camprubi-Soms, PA-C 10/12/14 [place], MD 10/12/14 1153

## 2014-10-12 NOTE — Anesthesia Procedure Notes (Signed)
Anesthesia Regional Block:  Axillary brachial plexus block  Pre-Anesthetic Checklist: ,, timeout performed, Correct Patient, Correct Site, Correct Laterality, Correct Procedure, Correct Position, site marked, Risks and benefits discussed,  Surgical consent,  Pre-op evaluation,  At surgeon's request and post-op pain management  Laterality: Left  Prep: chloraprep       Needles:   Needle Type: Echogenic Stimulator Needle     Needle Length: 5cm 5 cm Needle Gauge: 22 and 22 G    Additional Needles:  Procedures: nerve stimulator Axillary brachial plexus block Narrative:  Start time: 10/12/2014 8:20 PM End time: 10/12/2014 8:25 PM Injection made incrementally with aspirations every 5 mL.  Performed by: Personally   Additional Notes: 30 cc 0.5% lidocaine with 1:200 epi injected easily

## 2014-10-12 NOTE — Progress Notes (Signed)
Pt nasal swab positive for staph aureus. On call provider notified. No new orders given.

## 2014-10-12 NOTE — Progress Notes (Addendum)
Report received from [facility], awaiting arrival of patient.

## 2014-10-12 NOTE — Anesthesia Postprocedure Evaluation (Signed)
  Anesthesia Post-op Note  Patient: Richard Bradley  Procedure(s) Performed: Procedure(s): IRRIGATION AND DEBRIDEMENT INDEX FINGER (Left)  Patient Location: PACU  Anesthesia Type:MAC and Regional  Level of Consciousness: awake, alert  and oriented  Airway and Oxygen Therapy: Patient Spontanous Breathing and Patient connected to nasal cannula oxygen  Post-op Pain: none  Post-op Assessment: Post-op Vital signs reviewed, Patient's Cardiovascular Status Stable, Respiratory Function Stable, Patent Airway and Pain level controlled  Post-op Vital Signs: stable  Last Vitals:  Filed Vitals:   10/12/14 2151  BP: 115/74  Pulse: 110  Temp: 36.7 C  Resp: 16    Complications: No apparent anesthesia complications

## 2014-10-12 NOTE — Consult Note (Signed)
Reason for Consult:left index finger infection Referring Physician: dr. Phoebe Sharps Richard Bradley is an 81 y.o. male.  HPI:  Richard Bradley is a 81 y.o. male, history of myasthenia gravis chronically on steroid, chronic atrial fibrillation on Coumadin, prostate cancer, urinary incontinence, GERD, chronic anxiety, who is visiting his daughter from [place] comes in with 3 day history of hand redness which is gradually extending into his elbow, swelling over his left second MP joint, he was prescribed with Keflex without much benefit, his redness and warmth continued to get worse and he came to the ER.   In the ER workup suggests leukocytosis, effusion and cellulitis over the left second MP joint along with left hand and arm, he chronically runs low blood pressure according to the daughter his systolic is around 762, in the ER it was in 14s, he had mild sepsis per his clinical presentation.   Asian currently has no other systemic complaints, no fever chills, no headache chest pain cough and shortness of breath, his myasthenia gravis is stable, no worsening of his weakness, no abdominal pain or diarrhea, no focal weakness. He denies any injuries to his left hand or arm. Does not recall if he bumped his left hand or arm.  Past Medical History  Diagnosis Date  . Kidney stones   . Urine incontinence   . Atrial fibrillation   . Osteoarthritis   . Prostate cancer dx'd 1982  . Myasthenia gravis   . Dysrhythmia     hx of atrial fibrilation  . Cellulitis     left arm    Past Surgical History  Procedure Laterality Date  . Prostate surgery    . Hernia repair    . Total knee arthroplasty    . Rotator cuff repair    . Appendectomy    . Appendectomy    . Laminectomy  2005 ?    lumbar  . Kyphoplasty  2012 ?    Family History  Problem Relation Age of Onset  . Heart disease Father   . Heart disease Brother   . Heart disease Paternal Grandfather     Social History:  reports that he has  never smoked. He has never used smokeless tobacco. He reports that he does not drink alcohol or use illicit drugs.  Allergies: No Known Allergies  Medications: I have reviewed the patient's current medications.  Results for orders placed or performed during the hospital encounter of 10/12/14 (from the past 48 hour(s))  Protime-INR     Status: Abnormal   Collection Time: 10/12/14  9:13 AM  Result Value Ref Range   Prothrombin Time 28.9 (H) 11.6 - 15.2 seconds   INR 2.70 (H) 0.00 - 1.49  CBC with Differential     Status: Abnormal   Collection Time: 10/12/14  9:13 AM  Result Value Ref Range   WBC 10.6 (H) 4.0 - 10.5 K/uL   RBC 3.87 (L) 4.22 - 5.81 MIL/uL   Hemoglobin 12.8 (L) 13.0 - 17.0 g/dL   HCT 39.0 39.0 - 52.0 %   MCV 100.8 (H) 78.0 - 100.0 fL   MCH 33.1 26.0 - 34.0 pg   MCHC 32.8 30.0 - 36.0 g/dL   RDW 14.0 11.5 - 15.5 %   Platelets 154 150 - 400 K/uL   Neutrophils Relative % 84 (H) 43 - 77 %   Neutro Abs 8.9 (H) 1.7 - 7.7 K/uL   Lymphocytes Relative 9 (L) 12 - 46 %   Lymphs Abs 0.9  0.7 - 4.0 K/uL   Monocytes Relative 7 3 - 12 %   Monocytes Absolute 0.8 0.1 - 1.0 K/uL   Eosinophils Relative 0 0 - 5 %   Eosinophils Absolute 0.0 0.0 - 0.7 K/uL   Basophils Relative 0 0 - 1 %   Basophils Absolute 0.0 0.0 - 0.1 K/uL  Basic metabolic panel     Status: Abnormal   Collection Time: 10/12/14  9:13 AM  Result Value Ref Range   Sodium 140 135 - 145 mmol/L   Potassium 3.1 (L) 3.5 - 5.1 mmol/L   Chloride 108 96 - 112 mmol/L   CO2 23 19 - 32 mmol/L   Glucose, Bld 125 (H) 70 - 99 mg/dL   BUN 15 6 - 23 mg/dL   Creatinine, Ser 1.05 0.50 - 1.35 mg/dL   Calcium 8.1 (L) 8.4 - 10.5 mg/dL   GFR calc non Af Amer 62 (L) >90 mL/min   GFR calc Af Amer 72 (L) >90 mL/min    Comment: (NOTE) The eGFR has been calculated using the CKD EPI equation. This calculation has not been validated in all clinical situations. eGFR's persistently <90 mL/min signify possible Chronic Kidney Disease.     Anion gap 9 5 - 15  Sedimentation rate     Status: Abnormal   Collection Time: 10/12/14  9:13 AM  Result Value Ref Range   Sed Rate 29 (H) 0 - 16 mm/hr  Lactic acid, plasma     Status: None   Collection Time: 10/12/14 12:08 PM  Result Value Ref Range   Lactic Acid, Venous 1.3 0.5 - 2.0 mmol/L  Lactic acid, plasma     Status: Abnormal   Collection Time: 10/12/14  4:10 PM  Result Value Ref Range   Lactic Acid, Venous 3.0 (HH) 0.5 - 2.0 mmol/L    Comment: REPEATED TO VERIFY CRITICAL RESULT CALLED TO, READ BACK BY AND VERIFIED WITH: TANDON,R RN [id number] 10/12/14 WBOND     Dg Hand Complete Left  10/12/2014   CLINICAL DATA:  Swelling of the left second and CP joint. No known injury. Evaluate for osteomyelitis versus septic joint.  EXAM: LEFT HAND - COMPLETE 3+ VIEW  COMPARISON:  None.  FINDINGS: No fracture or dislocation with special attention paid to the second digit. Joint spaces are preserved. No erosions.  There is mild apparent soft tissue swelling about the second MCP joint, best appreciated on lateral radiograph. This finding is without associated osteolysis, radiopaque foreign body or subcutaneous emphysema.  Scattered extensive vascular calcifications about the imaged distal forearm and hand.  IMPRESSION: Soft tissue swelling about the second MCP joint without associated fracture radiographic evidence of osteomyelitis.   Electronically Signed   By: Sandi Mariscal M.D.   On: 10/12/2014 09:52    ROS NO RECENT ILLNESSES OR HOSPITALIZATIONS Blood pressure 117/80, pulse 99, temperature 98.4 F (36.9 C), temperature source Oral, resp. rate 16, height 6' 3"  (1.905 m), weight 82.691 kg (182 lb 4.8 oz), SpO2 94 %. Physical Exam  General Appearance:  Alert, cooperative, no distress, appears stated age  Head:  Normocephalic, without obvious abnormality, atraumatic  Eyes:  Pupils equal, conjunctiva/corneas clear,         Throat: Lips, mucosa, and tongue normal; teeth and gums normal  Neck: No  visible masses     Lungs:   respirations unlabored  Chest Wall:  No tenderness or deformity  Heart:  Regular rate and rhythm,  Abdomen:   Soft, non-tender,  Extremities: LEFT HAND: FLUCTUANT AREA OVER DORSUM OF LEFT INDEX FINGER. TTP OVER MP JOINT OF LEFT INDEX FINGERS WARM WELL PERFUSED REDNESS AND SWELLING UP TO REGION OF ELBOW SWELLING ALONG MEDIAL ASPECT OF FOREARM UNABLE TO EXTEND LONG FINGER ACTIVELY, EXTENSOR TENDON SUBLUXATION  Pulses: 2+ and symmetric  Skin: Skin color, texture, turgor normal, no rashes or lesions     Neurologic: Normal   Assessment/Plan: LEFT INDEX FINGER MP ABSCESS, POSSIBLE INFECTED MP JOINT  LEFT INDEX FINGER INCISION AND DRAINAGE DISCUSSED WITH PATIENT AND DAUGHTER AT BEDSIDE PT VOICED UNDERSTANDING OF PLAN AN REASON FOR INCISION AND DRAINAGE  R/B/A DISCUSSED WITH PT IN HOSPITAL.  PT VOICED UNDERSTANDING OF PLAN CONSENT SIGNED DAY OF SURGERY PT SEEN AND EXAMINED PRIOR TO OPERATIVE PROCEDURE/DAY OF SURGERY SITE MARKED. QUESTIONS ANSWERED WILL GO HOME FOLLOWING SURGERY  WE ARE PLANNING SURGERY FOR YOUR UPPER EXTREMITY. THE RISKS AND BENEFITS OF SURGERY INCLUDE BUT NOT LIMITED TO BLEEDING INFECTION, DAMAGE TO NEARBY NERVES ARTERIES TENDONS, FAILURE OF SURGERY TO ACCOMPLISH ITS INTENDED GOALS, PERSISTENT SYMPTOMS AND NEED FOR FURTHER SURGICAL INTERVENTION. WITH THIS IN MIND WE WILL PROCEED. I HAVE DISCUSSED WITH THE PATIENT THE PRE AND POSTOPERATIVE REGIMEN AND THE DOS AND DON'TS. PT VOICED UNDERSTANDING AND INFORMED CONSENT SIGNED.  Linna Hoff 10/12/2014, 5:25 PM .

## 2014-10-12 NOTE — ED Notes (Signed)
Hospitalist at bedside 

## 2014-10-12 NOTE — Progress Notes (Signed)
Patient in bed and alert and oriented. Daughter at bedside and vitals WNL.

## 2014-10-12 NOTE — Progress Notes (Signed)
Pt arrived back from PACU. No complaints of pain. Called On call provider to see if we should hold pts 50mg  PO lopressor. On call provider said to go ahead and give him the PO lopressor since he is still tachy. Will continue to monitor.   Filed Vitals:   10/12/14 2151  BP: 115/74  Pulse: 110  Temp: 98.1 F (36.7 C)  Resp: 16

## 2014-10-12 NOTE — H&P (Addendum)
Patient Demographics  Richard Bradley, is a 81 y.o. male  MRN: [id number]   DOB - 03-Oct-1925  Admit Date - 10/12/2014  Outpatient Primary MD for the patient is No PCP Per Patient   With History of -  Past Medical History  Diagnosis Date  . Kidney stones   . Urine incontinence   . Atrial fibrillation   . Osteoarthritis   . Prostate cancer dx'd 1982  . Myasthenia gravis       Past Surgical History  Procedure Laterality Date  . Prostate surgery    . Hernia repair    . Total knee arthroplasty    . Rotator cuff repair    . Appendectomy      in for   Chief Complaint  Patient presents with  . Cellulitis     HPI  Richard Bradley  is a 81 y.o. male, history of myasthenia gravis chronically on steroid, chronic atrial fibrillation on Coumadin, prostate cancer, urinary incontinence, GERD, chronic anxiety, who is visiting his daughter from [place] comes in with 3 day history of hand redness which is gradually extending into his elbow, swelling over his left second MP joint, he was prescribed with Keflex without much benefit, his redness and warmth continued to get worse and he came to the ER.   In the ER workup suggests leukocytosis, effusion and cellulitis over the left second MP joint along with left hand and arm, he chronically runs low blood pressure according to the daughter his systolic is around 242, in the ER it was in 73s, he had mild sepsis per his clinical presentation, his case was discussed with orthopedic hand surgeon Dr. Apolonio Schneiders and I was called to admit the patient.   Asian currently has no other systemic complaints, no fever chills, no headache chest pain cough and shortness of breath, his myasthenia gravis is stable, no worsening of his weakness, no abdominal pain or diarrhea, no focal weakness.  He denies any injuries to his left hand or arm. Does not recall if he bumped his left hand or arm.    Review of Systems    In addition to the HPI above,   No Fever-chills, No Headache, No changes with Vision or hearing, No problems swallowing food or Liquids, No Chest pain, Cough or Shortness of Breath, No Abdominal pain, No Nausea or Vommitting, Bowel movements are regular, No Blood in stool or Urine, No dysuria, No new skin rashes or bruises, except as above No new joints pains-aches,  No new weakness, tingling, numbness in any extremity, No recent weight gain or loss, No polyuria, polydypsia or polyphagia, No significant Mental Stressors.  A full 10 point Review of Systems was done, except as stated above, all other Review of Systems were negative.   Social History History  Substance Use Topics  . Smoking status: Never Smoker   . Smokeless tobacco: Never Used  . Alcohol Use: No  Comment: none      Family History Family History  Problem Relation Age of Onset  . Heart disease Father   . Heart disease Brother   . Heart disease Paternal Grandfather       Prior to Admission medications   Medication Sig Start Date End Date Taking? Authorizing Provider  calcium citrate-vitamin D (CITRACAL+D) 315-200 MG-UNIT per tablet Take 1 tablet by mouth 2 (two) times daily.    Historical Provider, MD  clonazePAM (KLONOPIN) 0.5 MG tablet 1 for sleep taken 1/2 hour before bedtime 08/12/14   Deneise Lever, MD  fluticasone [facility]) 50 MCG/ACT nasal spray Place 2 sprays into both nostrils daily as needed for allergies or rhinitis.    Historical Provider, MD  furosemide (LASIX) 20 MG tablet Take 1 tablet (20 mg total) by mouth daily. As needed diuretic 04/30/12 04/30/13  Tammy S Parrett, NP  furosemide (LASIX) 20 MG tablet Take 20 mg by mouth.    Historical Provider, MD  HYDROcodone-acetaminophen (NORCO) 10-325 MG per tablet Take 1 tablet by mouth every 6 (six) hours as needed.     Historical Provider, MD  metoprolol (LOPRESSOR) 50 MG tablet Take 50 mg by mouth 3 (three) times daily.     Historical Provider, MD  mometasone (NASONEX) 50 MCG/ACT nasal spray Place 2 sprays into the nose daily as needed.    Historical Provider, MD  omeprazole (PRILOSEC) 20 MG capsule Take 1 capsule (20 mg total) by mouth daily. 04/30/12   Tammy S Parrett, NP  predniSONE (DELTASONE) 10 MG tablet 3 tablets by mouth daily for total of 30mg  Patient taking differently: 15 mg.  12/27/11   Clearnce Sorrel, MD  vitamin B-12 (CYANOCOBALAMIN) 1000 MCG tablet Take 1,000 mcg by mouth daily.    Historical Provider, MD  warfarin (COUMADIN) 5 MG tablet Take 5 mg by mouth daily.     Historical Provider, MD    No Known Allergies  Physical Exam  Vitals  Blood pressure 101/65, pulse 95, temperature 98.8 F (37.1 C), temperature source Oral, resp. rate 21, SpO2 87 %.   1. General frail elderly white male lying in bed in NAD,     2. Normal affect and insight, Not Suicidal or Homicidal, Awake Alert, Oriented X 3.  3. No F.N deficits, ALL C.Nerves Intact, Strength 5/5 all 4 extremities, Sensation intact all 4 extremities, Plantars down going.  4. Ears and Eyes appear Normal, Conjunctivae clear, PERRLA. Moist Oral Mucosa.  5. Supple Neck, No JVD, No cervical lymphadenopathy appriciated, No Carotid Bruits.  6. Symmetrical Chest wall movement, Good air movement bilaterally, CTAB.  7. RRR, No Gallops, Rubs or Murmurs, No Parasternal Heave.  8. Positive Bowel Sounds, Abdomen Soft, No tenderness, No organomegaly appriciated, No rebound -guarding or rigidity.  9.  No Cyanosis, Normal Skin Turgor, No Skin Rash or Bruise. Except left hand cellulitis extending 2 inches above his L elbow, left second MP joint has fluctuance and swelling along with redness  10. Good muscle tone,  joints appear normal , no effusions, Normal ROM.  11. No Palpable Lymph Nodes in Neck or Axillae     Data Review  CBC  Recent  Labs Lab 10/12/14 0913  WBC 10.6*  HGB 12.8*  HCT 39.0  PLT 154  MCV 100.8*  MCH 33.1  MCHC 32.8  RDW 14.0  LYMPHSABS 0.9  MONOABS 0.8  EOSABS 0.0  BASOSABS 0.0   ------------------------------------------------------------------------------------------------------------------  Chemistries   Recent Labs Lab 10/12/14 0913  NA 140  K 3.1*  CL 108  CO2 23  GLUCOSE 125*  BUN 15  CREATININE 1.05  CALCIUM 8.1*   ------------------------------------------------------------------------------------------------------------------ CrCl cannot be calculated (Unknown ideal weight.). ------------------------------------------------------------------------------------------------------------------ No results for input(s): TSH, T4TOTAL, T3FREE, THYROIDAB in the last 72 hours.  Invalid input(s): FREET3   Coagulation profile  Recent Labs Lab 10/12/14 0913  INR 2.70*   ------------------------------------------------------------------------------------------------------------------- No results for input(s): DDIMER in the last 72 hours. -------------------------------------------------------------------------------------------------------------------  Cardiac Enzymes No results for input(s): CKMB, TROPONINI, MYOGLOBIN in the last 168 hours.  Invalid input(s): CK ------------------------------------------------------------------------------------------------------------------ Invalid input(s): POCBNP   ---------------------------------------------------------------------------------------------------------------  Urinalysis No results found for: COLORURINE, APPEARANCEUR, LABSPEC, [place], GLUCOSEU, HGBUR, BILIRUBINUR, KETONESUR, PROTEINUR, UROBILINOGEN, NITRITE, LEUKOCYTESUR  ----------------------------------------------------------------------------------------------------------------  Imaging results:   Dg Hand Complete Left  10/12/2014   CLINICAL DATA:  Swelling of  the left second and CP joint. No known injury. Evaluate for osteomyelitis versus septic joint.  EXAM: LEFT HAND - COMPLETE 3+ VIEW  COMPARISON:  None.  FINDINGS: No fracture or dislocation with special attention paid to the second digit. Joint spaces are preserved. No erosions.  There is mild apparent soft tissue swelling about the second MCP joint, best appreciated on lateral radiograph. This finding is without associated osteolysis, radiopaque foreign body or subcutaneous emphysema.  Scattered extensive vascular calcifications about the imaged distal forearm and hand.  IMPRESSION: Soft tissue swelling about the second MCP joint without associated fracture radiographic evidence of osteomyelitis.   Electronically Signed   By: Sandi Mariscal M.D.   On: 10/12/2014 09:52    My personal review of EKG: Rhythm Afib, Rate  98 /min, non specific ST changes    Assessment & Plan   1. Left hand/arm cellulitis with possible effusion over his left second MP joint. Blood cultures drawn in the ER, he will be admitted on telemetry bed, IV vancomycin dosed by pharmacy, I have requested and surgeon Dr. Apolonio Schneiders to evaluate the patient one time. IV fluid bolus followed by maintenance, hold Lasix.   2. Myasthenia gravis. Stable. Continue home dose prednisone, if blood pressure continues to be soft we'll convert to stress dose steroids. We will have PT OT follow him.   3. Chronic atrial fibrillation CHADS2 VASC score 1-2 - continue beta blocker as tolerated by blood pressure, Coumadin dosed by pharmacy, next dose due this evening. If he is going for surgery we will continue to hold Coumadin, I don't think he requires bridging for heparin as his chads score is 1-2 at the most.   4. History of prostate cancer. Stable no acute issues.    5. Hypokalemia. Replaced    DVT Prophylaxis Coumadin  AM Labs Ordered, also please review Full Orders  Family Communication: Admission, patients condition and plan of care  including tests being ordered have been discussed with the patient and daughter who indicate understanding and agree with the plan and Code Status.  Code Status Full  Likely DC to Home  Condition Fair  Time spent in minutes : 35    SINGH,PRASHANT K M.D on 10/12/2014 at 12:09 PM  Between 7am to 7pm - Pager - [phone]  After 7pm go to [email or web address] - password [facility]  [facility]  Office  [phone]

## 2014-10-12 NOTE — ED Provider Notes (Addendum)
Patient presented to the ER with infection of left hand. Symptoms began 2 days ago. He was given an injection of antibiotic and started on Keflex 2 days ago by his orthopedic doctor. Symptoms have significantly worsened.  Face to face Exam: HEENT - PERRLA Lungs - CTAB Heart - RRR, no M/R/G Abd - S/NT/ND Neuro - alert, oriented x3 Musculoskeletal - tenderness swelling left 2nd MCP joint Skin - erythema dorsal aspect of left hand from MCP joints to elbow  Plan: Baseline labs, x-ray. Patient will require IV antibiotic therapy and hand surgery consultation. He has been on outpatient antibiotic therapy with significant worsening.   EKG Interpretation  Date/Time:  Wednesday October 12 2014 12:00:36 EDT Ventricular Rate:  98 PR Interval:    QRS Duration: 111 QT Interval:  378 QTC Calculation: 483 R Axis:   -106 Text Interpretation:  Atrial fibrillation Right superior axis Minimal ST depression, lateral leads Borderline prolonged QT interval No significant change since last tracing Confirmed by Betsey Holiday  MD, Bennie Chirico [phone]) on 10/12/2014 12:03:47 PM        Orpah Greek, MD 10/12/14 0930  Orpah Greek, MD 10/12/14 1204

## 2014-10-12 NOTE — Progress Notes (Addendum)
OR called report for patient at [facility] 10/12/2014. MD orders in computer for patient NPO after midnight.  Patient ate dinner tray thinking surgery in AM.  Dr. Caralyn Guile notified and consulted with anesthesia. Patient to still go to OR for surgery. Hibiclens and CHG initiated and awaiting pickup for OR.  Report given to anesthesia charge RN at [phone] for surgery. RN made aware of patient eating dinner tray at 1700.

## 2014-10-12 NOTE — Transfer of Care (Signed)
Immediate Anesthesia Transfer of Care Note  Patient: Richard Bradley  Procedure(s) Performed: Procedure(s): IRRIGATION AND DEBRIDEMENT INDEX FINGER (Left)  Patient Location: PACU  Anesthesia Type:MAC combined with regional for post-op pain  Level of Consciousness: awake, alert  and oriented  Airway & Oxygen Therapy: Patient Spontanous Breathing  Post-op Assessment: Report given to RN and Post -op Vital signs reviewed and stable  Post vital signs: Reviewed and stable  Last Vitals:  Filed Vitals:   10/12/14 1355  BP: 117/80  Pulse: 99  Temp: 36.9 C  Resp: 16    Complications: No apparent anesthesia complications

## 2014-10-12 NOTE — ED Notes (Signed)
Pt here for left hand swelling redness. Spreading into FA. Pt sts currently taking abx

## 2014-10-12 NOTE — Anesthesia Preprocedure Evaluation (Addendum)
Anesthesia Evaluation  Patient identified by MRN, date of birth, ID band Patient awake    Reviewed: Allergy & Precautions, NPO status , Patient's Chart, lab work & pertinent test results, reviewed documented beta blocker date and time   Airway Mallampati: I  TM Distance: <3 FB Neck ROM: Full    Dental  (+) Teeth Intact, Dental Advisory Given   Pulmonary  breath sounds clear to auscultation        Cardiovascular + DOE + dysrhythmias Atrial Fibrillation Rhythm:Irregular     Neuro/Psych  Neuromuscular disease    GI/Hepatic   Endo/Other    Renal/GU      Musculoskeletal   Abdominal   Peds  Hematology   Anesthesia Other Findings   Reproductive/Obstetrics                            Anesthesia Physical Anesthesia Plan  ASA: III and emergent  Anesthesia Plan: Regional and MAC   Post-op Pain Management:    Induction:   Airway Management Planned:   Additional Equipment:   Intra-op Plan:   Post-operative Plan:   Informed Consent:   Dental advisory given  Plan Discussed with: CRNA, Anesthesiologist and Surgeon  Anesthesia Plan Comments:         Anesthesia Quick Evaluation

## 2014-10-12 NOTE — Brief Op Note (Signed)
10/12/2014  5:28 PM  PATIENT:  Richard Bradley  81 y.o. male  PRE-OPERATIVE DIAGNOSIS:  infected left index finger  POST-OPERATIVE DIAGNOSIS:  same  PROCEDURE:  Procedure(s): IRRIGATION AND DEBRIDEMENT INDEX FINGER (Left)  SURGEON:  Surgeon(s) and Role:    * Iran Planas, MD - Primary  PHYSICIAN ASSISTANT:   ASSISTANTS: none   ANESTHESIA:   general  EBL:  Total I/O In: 541.3 [P.O.:220; I.V.:321.3] Out: -   BLOOD ADMINISTERED:none  DRAINS: none   LOCAL MEDICATIONS USED:  NONE  SPECIMEN:  No Specimen  DISPOSITION OF SPECIMEN:  N/A  COUNTS:  YES  TOURNIQUET:  * No tourniquets in log *  DICTATION: .[id number]  PLAN OF CARE: Admit to inpatient   PATIENT DISPOSITION:  PACU - hemodynamically stable.   Delay start of Pharmacological VTE agent (>24hrs) due to surgical blood loss or risk of bleeding: not applicable

## 2014-10-12 NOTE — Progress Notes (Signed)
CRITICAL VALUE ALERT  Critical value received:  Lactic Acid 3.0  Date of notification:  10/12/2014  Time of notification:  1523  Critical value read back:Yes.    Nurse who received alert:  Marney Doctor RN  MD notified (1st page):  Candiss Norse  Time of first page:  33  MD notified (2nd page):  Time of second page:  Responding MD:    Time MD responded:

## 2014-10-12 NOTE — Progress Notes (Addendum)
ANTIBIOTIC CONSULT NOTE - INITIAL  Pharmacy Consult for vancomycin Indication: cellulitis  No Known Allergies  Patient Measurements:   Adjusted Body Weight:   Vital Signs: Temp: 98.8 F (37.1 C) (03/16 0845) Temp Source: Oral (03/16 0845) BP: 88/66 mmHg (03/16 1130) Pulse Rate: 96 (03/16 1115) Intake/Output from previous day:   Intake/Output from this shift:    Labs:  Recent Labs  10/12/14 0913  WBC 10.6*  HGB 12.8*  PLT 154  CREATININE 1.05   CrCl cannot be calculated (Unknown ideal weight.). No results for input(s): VANCOTROUGH, VANCOPEAK, VANCORANDOM, GENTTROUGH, GENTPEAK, GENTRANDOM, TOBRATROUGH, TOBRAPEAK, TOBRARND, AMIKACINPEAK, AMIKACINTROU, AMIKACIN in the last 72 hours.   Microbiology: No results found for this or any previous visit (from the past 720 hour(s)).  Medical History: Past Medical History  Diagnosis Date  . Kidney stones   . Urine incontinence   . Atrial fibrillation   . Osteoarthritis   . Prostate cancer dx'd 1982  . Myasthenia gravis     Medications:  Anti-infectives    Start     Dose/Rate Route Frequency Ordered Stop   10/13/14 1300  vancomycin (VANCOCIN) 1,250 mg in sodium chloride 0.9 % 250 mL IVPB     1,250 mg 166.7 mL/hr over 90 Minutes Intravenous Every 24 hours 10/12/14 1140     10/12/14 1100  vancomycin (VANCOCIN) 1,500 mg in sodium chloride 0.9 % 500 mL IVPB     1,500 mg 250 mL/hr over 120 Minutes Intravenous  Once 10/12/14 1051       Assessment: 63 yom presented to the ED with worsening left hand infection. Had been started on keflex PTA but without improvement. Pt is afebrile and WBC is slightly elevated. SCr is at baseline and WNL at 1.05.   Vanc 3/16>>  Goal of Therapy:  Vancomycin trough level 10-15 mcg/ml  Plan:  - Vancomycin 1500mg  IV x 1 then 1250mg  IV Q24H - F/u renal fxn, C&S, clinical status and trough at [place], Rande Lawman 10/12/2014,11:41 AM  Addendum: Also continuing patients home coumadin  for history of afib. INR is therapeutic at 2.7. Pt is has not taken his dose today.  PTA dose: warfarin 5mg  daily  Plan: - Warfarin 5mg  PO x 1 tonight - Daily INR  Salome Arnt, PharmD, BCPS Pager # [phone] 10/12/2014 2:08 PM

## 2014-10-13 ENCOUNTER — Encounter (HOSPITAL_COMMUNITY): Payer: Self-pay | Admitting: Orthopedic Surgery

## 2014-10-13 ENCOUNTER — Telehealth: Payer: Self-pay | Admitting: Internal Medicine

## 2014-10-13 LAB — CBC
HCT: 36.9 % — ABNORMAL LOW (ref 39.0–52.0)
Hemoglobin: 12 g/dL — ABNORMAL LOW (ref 13.0–17.0)
MCH: 32.6 pg (ref 26.0–34.0)
MCHC: 32.5 g/dL (ref 30.0–36.0)
MCV: 100.3 fL — ABNORMAL HIGH (ref 78.0–100.0)
Platelets: 143 10*3/uL — ABNORMAL LOW (ref 150–400)
RBC: 3.68 MIL/uL — ABNORMAL LOW (ref 4.22–5.81)
RDW: 13.9 % (ref 11.5–15.5)
WBC: 8.7 10*3/uL (ref 4.0–10.5)

## 2014-10-13 LAB — BASIC METABOLIC PANEL
Anion gap: 7 (ref 5–15)
BUN: 13 mg/dL (ref 6–23)
CO2: 21 mmol/L (ref 19–32)
Calcium: 8.1 mg/dL — ABNORMAL LOW (ref 8.4–10.5)
Chloride: 112 mmol/L (ref 96–112)
Creatinine, Ser: 0.91 mg/dL (ref 0.50–1.35)
GFR calc Af Amer: 86 mL/min — ABNORMAL LOW (ref >90)
GFR calc non Af Amer: 75 mL/min — ABNORMAL LOW (ref >90)
Glucose, Bld: 90 mg/dL (ref 70–99)
Potassium: 4 mmol/L (ref 3.5–5.1)
Sodium: 140 mmol/L (ref 135–145)

## 2014-10-13 LAB — GLUCOSE, CAPILLARY
GLUCOSE-CAPILLARY: 100 mg/dL — AB (ref 70–99)
Glucose-Capillary: 81 mg/dL (ref 70–99)
Glucose-Capillary: 131 mg/dL — ABNORMAL HIGH (ref 70–99)
Glucose-Capillary: 150 mg/dL — ABNORMAL HIGH (ref 70–99)
Glucose-Capillary: 88 mg/dL (ref 70–99)

## 2014-10-13 LAB — PROTIME-INR
INR: 2.75 — ABNORMAL HIGH (ref 0.00–1.49)
Prothrombin Time: 29.3 seconds — ABNORMAL HIGH (ref 11.6–15.2)

## 2014-10-13 MED ORDER — CHLORHEXIDINE GLUCONATE CLOTH 2 % EX PADS
6.0000 | MEDICATED_PAD | Freq: Every day | CUTANEOUS | Status: DC
  Administered 2014-10-13 – 2014-10-14 (×2): 6 via TOPICAL

## 2014-10-13 MED ORDER — WARFARIN SODIUM 5 MG PO TABS
5.0000 mg | ORAL_TABLET | Freq: Once | ORAL | Status: AC
Start: 2014-10-13 — End: 2014-10-13
  Administered 2014-10-13: 5 mg via ORAL
  Filled 2014-10-13: qty 1

## 2014-10-13 MED ORDER — MUPIROCIN 2 % EX OINT
TOPICAL_OINTMENT | Freq: Two times a day (BID) | CUTANEOUS | Status: DC
  Administered 2014-10-13: 1 via NASAL
  Administered 2014-10-14: 11:00:00 via NASAL
  Filled 2014-10-13: qty 22

## 2014-10-13 MED ORDER — CLONAZEPAM 0.5 MG PO TABS: ORAL_TABLET | ORAL | Status: DC

## 2014-10-13 MED ORDER — VANCOMYCIN HCL 10 G IV SOLR
1500.0000 mg | INTRAVENOUS | Status: DC
  Administered 2014-10-13 – 2014-10-14 (×2): 1500 mg via INTRAVENOUS
  Filled 2014-10-13 (×2): qty 1500

## 2014-10-13 NOTE — Telephone Encounter (Signed)
Ok to refill klonopin 

## 2014-10-13 NOTE — Evaluation (Signed)
Occupational Therapy Evaluation Patient Details Name: Richard Bradley MRN: [id number] DOB: 1926/06/16 Today's Date: 10/13/2014    History of Present Illness Pt is an 81 y.o. Male s/p I&D of left IF MCP for infectious arthritis on 10/12/14. PMH myasthenia gravis on steroids, chronic a fib on Coumadin, prostate cancer, urinary incontinence, GERD, anxiety, with 3 day hx of hand redness.   Clinical Impression   PTA pt lived at home with his wife and was independent with use of SPC for functional mobility. Pt's wife assists with setup of food due to arthritic deformities of Bil hands and clothing fasteners. Pt required supervision for functional mobility around room and is likely at or near baseline. Educated pt on one handed techniques. Pt has family support at d/c and no further acute OT needs. Progress rehab of hand as directed by MD.    Follow Up Recommendations  No OT follow up;Other (comment) (progress rehab of hand as directed by MD)    Equipment Recommendations  None recommended by OT    Recommendations for Other Services       Precautions / Restrictions Restrictions Weight Bearing Restrictions: No      Mobility Bed Mobility Overal bed mobility: Modified Independent                Transfers Overall transfer level: Needs assistance Equipment used: Straight cane Transfers: Sit to/from Stand Sit to Stand: Supervision         General transfer comment: Supervision for safety.     Balance Overall balance assessment: No apparent balance deficits (not formally assessed)                                          ADL Overall ADL's : Needs assistance/impaired Eating/Feeding: Set up;Sitting Eating/Feeding Details (indicate cue type and reason): at baseline pt requires setup Grooming: Minimal assistance;Sitting   Upper Body Bathing: Minimal assitance;Sitting Upper Body Bathing Details (indicate cue type and reason): educated pt on keeping LUE dry when  bathing Lower Body Bathing: Minimal assistance;Sit to/from stand   Upper Body Dressing : Minimal assistance;Sitting   Lower Body Dressing: Sit to/from stand;Minimal assistance Lower Body Dressing Details (indicate cue type and reason): pt typically wears slip on shoes Toilet Transfer: Supervision/safety;Ambulation Toilet Transfer Details (indicate cue type and reason): with use of SPC, supervision for ambulation and transfers         Functional mobility during ADLs: Supervision/safety;Cane General ADL Comments: Pt daughter was present for session and she reports he will be staying with her for a few weeks. Daughter has walk-in shower and can assist pt and his wife as needed. Pt is likely close to baseline as his wife assists with setup of food and closures on clothing due to arthritic deformities in hands.      Vision Vision Assessment?: No apparent visual deficits          Pertinent Vitals/Pain Pain Assessment: No/denies pain     Hand Dominance Right   Extremity/Trunk Assessment Upper Extremity Assessment Upper Extremity Assessment: LUE deficits/detail LUE Deficits / Details: LUE splinted from distal phalanges to mid forearm. Thumb MCP joint free. Pt able to wiggle all fingers and reports intact light sensation.  LUE: Unable to fully assess due to immobilization LUE Coordination: decreased fine motor   Lower Extremity Assessment Lower Extremity Assessment: Overall WFL for tasks assessed   Cervical / Trunk Assessment Cervical /  Trunk Assessment: Normal   Communication Communication Communication: No difficulties   Cognition Arousal/Alertness: Awake/alert Behavior During Therapy: WFL for tasks assessed/performed Overall Cognitive Status: Within Functional Limits for tasks assessed                                Home Living Family/patient expects to be discharged to:: Private residence Living Arrangements: Spouse/significant other;Children (visiting  daughter for a few weeks) Available Help at Discharge: Family;Available 24 hours/day Type of Home: House             Bathroom Shower/Tub: Walk-in shower (at daughters; tub at home)   Biochemist, clinical: Standard     Home Equipment: Shower seat          Prior Functioning/Environment Level of Independence: Independent with assistive device(s)        Comments: Pt ambulates with SPC typically and "furniture cruises" per daughter. Occasionally uses RW. Due to arthritic deformities of Bil hands, pt requires setup for food but denies difficulty with self-feeding. His wife helps with buttons and closures on clothing.     OT Diagnosis: Generalized weakness;Acute pain    End of Session Equipment Utilized During Treatment: Other (comment) [facility])  Activity Tolerance: Patient tolerated treatment well Patient left: in bed;with call bell/phone within reach;with family/visitor present   Time: 3086-5784 OT Time Calculation (min): 12 min Charges:  OT General Charges $OT Visit: 1 Procedure OT Evaluation $Initial OT Evaluation Tier I: 1 Procedure G-Codes:    Juluis Rainier 11/06/14, 9:21 AM  Cyndie Chime, OTR/L Occupational Therapist [phone] (pager)

## 2014-10-13 NOTE — Telephone Encounter (Signed)
Called and spoke to pt's EC, [name]. Informed [name] of the refill. Rx called into preferred pharmacy. Pt verbalized understanding and denied any further questions or concerns at this time.

## 2014-10-13 NOTE — Telephone Encounter (Signed)
PT is being discharged from hospital today.  Has f/u with Dr Annamaria Boots on 10/17/14 but will be out of Klonopin when he goes home.  Please advise if ok to refill.

## 2014-10-13 NOTE — Evaluation (Signed)
Physical Therapy Evaluation Patient Details Name: Richard Bradley MRN: [id number] DOB: [birth year] Today's Date: 10/13/2014   History of Present Illness  Pt is an 81 y.o. Male s/p I&D of left IF MCP for infectious arthritis on 10/12/14. PMH myasthenia gravis on steroids, chronic a fib on Coumadin, prostate cancer, urinary incontinence, GERD, anxiety, with 3 day hx of hand redness.  Clinical Impression  Pt is at baseline functioning and performed rather well on the DGI at 21/24 or having less risk for falls.  Discuss with he and wife to find them an activity to improve conditioning and stamina now that it's getting warmer outside.  No further PT needs, will sign off.    Follow Up Recommendations No PT follow up    Equipment Recommendations  None recommended by PT    Recommendations for Other Services       Precautions / Restrictions Restrictions Weight Bearing Restrictions: No      Mobility  Bed Mobility Overal bed mobility: Modified Independent                Transfers Overall transfer level: Modified independent Equipment used: Straight cane             General transfer comment: safe transition  Ambulation/Gait Ambulation/Gait assistance: Modified independent (Device/Increase time) Ambulation Distance (Feet): 400 Feet Assistive device: Straight cane Gait Pattern/deviations: Step-through pattern Gait velocity: WFL   General Gait Details: steady but with kyphosis  Stairs Stairs: Yes Stairs assistance: Supervision Stair Management: One rail Right;Alternating pattern;Forwards Number of Stairs: 2 General stair comments: safe with rail  Wheelchair Mobility    Modified Rankin (Stroke Patients Only)       Balance Overall balance assessment: Needs assistance Sitting-balance support: No upper extremity supported Sitting balance-Leahy Scale: Good     Standing balance support: No upper extremity supported Standing balance-Leahy Scale: Good                    Standardized Balance Assessment Standardized Balance Assessment : Dynamic Gait Index   Dynamic Gait Index Level Surface: Normal Change in Gait Speed: Normal Gait with Horizontal Head Turns: Normal Gait with Vertical Head Turns: Normal Gait and Pivot Turn: Mild Impairment Step Over Obstacle: Mild Impairment Step Around Obstacles: Normal Steps: Mild Impairment Total Score: 21       Pertinent Vitals/Pain Pain Assessment: No/denies pain    Home Living Family/patient expects to be discharged to:: Private residence Living Arrangements: Spouse/significant other;Children Available Help at Discharge: Family;Available 24 hours/day Type of Home: House         Home Equipment: Shower seat;Cane - single point      Prior Function Level of Independence: Independent with assistive device(s)         Comments: Pt ambulates with SPC typically and "furniture cruises" per daughter. Occasionally uses RW. Due to arthritic deformities of Bil hands, pt requires setup for food but denies difficulty with self-feeding. His wife helps with buttons and closures on clothing.      Hand Dominance   Dominant Hand: Right    Extremity/Trunk Assessment   Upper Extremity Assessment: Defer to OT evaluation           Lower Extremity Assessment: Overall WFL for tasks assessed      Cervical / Trunk Assessment: Normal  Communication   Communication: No difficulties  Cognition Arousal/Alertness: Awake/alert Behavior During Therapy: WFL for tasks assessed/performed Overall Cognitive Status: Within Functional Limits for tasks assessed  General Comments General comments (skin integrity, edema, etc.): Discussed pt and wife find and activity to do together to build some conditioning--pt was a bit out of breath, but recovered fairly quickly.    Exercises        Assessment/Plan    PT Assessment Patent does not need any further PT services  PT  Diagnosis     PT Problem List    PT Treatment Interventions     PT Goals (Current goals can be found in the Care Plan section) Acute Rehab PT Goals PT Goal Formulation: All assessment and education complete, DC therapy    Frequency     Barriers to discharge        Co-evaluation               End of Session   Activity Tolerance: Patient tolerated treatment well Patient left: in bed;with call bell/phone within reach;with family/visitor present Nurse Communication: Mobility status         Time: 8250-0370 PT Time Calculation (min) (ACUTE ONLY): 17 min   Charges:   PT Evaluation $Initial PT Evaluation Tier I: 1 Procedure     PT G Codes:        Enes Rokosz, Tessie Fass 10/13/2014, 4:22 PM 10/13/2014  Donnella Sham, PT [phone]  (pager)

## 2014-10-13 NOTE — Progress Notes (Signed)
Utilization review completed. Samaia Iwata, RN, BSN. 

## 2014-10-13 NOTE — Progress Notes (Signed)
Patient Demographics  Richard Bradley, is a 81 y.o. male, DOB - 1926/04/17, PJK:[id number]  Admit date - 10/12/2014   Admitting Physician Thurnell Lose, MD  Outpatient Primary MD for the patient is No PCP Per Patient  LOS - 1   Chief Complaint  Patient presents with  . Cellulitis      Admission history of present illness/brief narrative: Richard Bradley is a 81 y.o. male, history of myasthenia gravis chronically on steroid, chronic atrial fibrillation on Coumadin, prostate cancer, urinary incontinence, GERD, chronic anxiety, who is visiting his daughter from [place] comes in with 3 day history of hand redness which is gradually extending into his elbow, swelling over his left second MP joint, he was prescribed with Keflex without much benefit, his redness and warmth continued to get worse and he came to the ER. In the ER workup suggests leukocytosis, effusion and cellulitis over the left second MP joint along with left hand and arm, he chronically runs low blood pressure according to the daughter his systolic is around 809, in the ER it was in 22s, he had mild sepsis per his clinical presentation, Patient  denies any injuries to his left hand or arm. Does not recall if he bumped his left hand or arm, patient was started on IV vancomycin, was seen by orthopedic Dr. Apolonio Schneiders, Left index finger metacarpophalangeal joint arthrotomy, exploration, and drainage.with Left index finger tenosynovectomy.   Subjective:   Richard Bradley today has, No headache, No chest pain, No abdominal pain - No Nausea, No new weakness tingling or numbness, No Cough - SOB.   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 - Patient had index finger metacarpophalangeal joint arthrotomy, exploration, and drainage.with Left index finger tenosynovectomy on 3/16 by Dr. Apolonio Schneiders - Continue with IV vancomycin. - Follow on wound cultures   Myasthenia gravis.  - Stable. Continue home dose prednisone  Chronic atrial fibrillation CHADS2 VASC score 1-2  -- continue beta blocker as tolerated by blood pressure, Coumadin dosed by pharmacy,   History of prostate cancer.  - Stable no acute issues.  Hypokalemia - Repleted   Code Status: Full  Family Communication: Daughter at bedside  Disposition Plan: Home with home care once stable   Procedures   Left index finger metacarpophalangeal joint arthrotomy, exploration, and drainage.with Left index finger tenosynovectomy 3/16. Consults   Hand surgery   Medications  Scheduled Meds: . calcium-vitamin D  1 tablet Oral BID WC  . Chlorhexidine Gluconate Cloth  6 each Topical Q0600  . clonazePAM  0.25 mg Oral QHS  . fluticasone  2 spray Each Nare Daily  . insulin aspart  0-9 Units Subcutaneous TID WC  . metoprolol  50 mg Oral BID  . mupirocin ointment   Nasal BID  . pantoprazole  40 mg Oral Daily  . polyethylene glycol  17 g Oral Daily  . predniSONE  20 mg Oral  Q breakfast  . sodium chloride  3 mL Intravenous Q12H  . vancomycin  1,500 mg Intravenous Q24H  . vitamin B-12  1,000 mcg Oral Daily  . warfarin  5 mg Oral ONCE-1800  . Warfarin - Pharmacist Dosing Inpatient   Does not apply q1800   Continuous Infusions:  PRN Meds:.alum & mag hydroxide-simeth, fluticasone, guaiFENesin-dextromethorphan, HYDROcodone-acetaminophen, ondansetron **OR** ondansetron (ZOFRAN) IV  DVT Prophylaxis  on warfarin  Lab Results  Component Value Date   PLT 143* 10/13/2014    Antibiotics    Anti-infectives     Start     Dose/Rate Route Frequency Ordered Stop   10/13/14 1300  vancomycin (VANCOCIN) 1,250 mg in sodium chloride 0.9 % 250 mL IVPB  Status:  Discontinued     1,250 mg 166.7 mL/hr over 90 Minutes Intravenous Every 24 hours 10/12/14 1140 10/13/14 1129   10/13/14 1300  vancomycin (VANCOCIN) 1,500 mg in sodium chloride 0.9 % 500 mL IVPB     1,500 mg 250 mL/hr over 120 Minutes Intravenous Every 24 hours 10/13/14 1129     10/12/14 1100  vancomycin (VANCOCIN) 1,500 mg in sodium chloride 0.9 % 500 mL IVPB     1,500 mg 250 mL/hr over 120 Minutes Intravenous  Once 10/12/14 1051 10/12/14 1348          Objective:   Filed Vitals:   10/12/14 2151 10/12/14 2307 10/13/14 0552 10/13/14 0555  BP: 115/74 134/91 145/95   Pulse: 110 102    Temp: 98.1 F (36.7 C)  98.3 F (36.8 C)   TempSrc: Oral     Resp: 16  16   Height:      Weight:    83.7 kg (184 lb 8.4 oz)  SpO2: 94% 97% 93%     Wt Readings from Last 3 Encounters:  10/13/14 83.7 kg (184 lb 8.4 oz)  08/12/14 77.111 kg (170 lb)  07/30/14 79.289 kg (174 lb 12.8 oz)     Intake/Output Summary (Last 24 hours) at 10/13/14 1507 Last data filed at 10/13/14 1145  Gross per 24 hour  Intake 3242.5 ml  Output   1100 ml  Net 2142.5 ml     Physical Exam  Awake Alert, Oriented X 3, No new F.N deficits, Normal affect [place].AT,PERRAL Supple Neck,No JVD, No cervical lymphadenopathy appriciated.  Symmetrical Chest wall movement, Good air movement bilaterally, CTAB RRR,No Gallops,Rubs or new Murmurs, No Parasternal Heave +ve B.Sounds, Abd Soft, No tenderness, No organomegaly appriciated, No rebound - guarding or rigidity. No Cyanosis, Clubbing or edema, No new Rash or bruise , right hand in bandage and Ace wrap   Data Review   Micro Results Recent Results (from the past 240 hour(s))  Culture, blood (routine x 2)     Status: None (Preliminary result)   Collection Time: 10/12/14 10:15 AM  Result Value Ref Range Status   Specimen  Description BLOOD RIGHT HAND  Final   Special Requests BOTTLES DRAWN AEROBIC AND ANAEROBIC 5CC  Final   Culture   Final           BLOOD CULTURE RECEIVED NO GROWTH TO DATE CULTURE WILL BE HELD FOR 5 DAYS BEFORE ISSUING A FINAL NEGATIVE REPORT Performed at [facility]    Report Status PENDING  Incomplete  Culture, blood (routine x 2)     Status: None (Preliminary result)   Collection Time: 10/12/14 10:20 AM  Result Value Ref Range Status   Specimen Description BLOOD LEFT HAND  Final   Special Requests BOTTLES  DRAWN AEROBIC AND ANAEROBIC 5CC  Final   Culture   Final           BLOOD CULTURE RECEIVED NO GROWTH TO DATE CULTURE WILL BE HELD FOR 5 DAYS BEFORE ISSUING A FINAL NEGATIVE REPORT Performed at [facility]    Report Status PENDING  Incomplete  Surgical PCR screen     Status: Abnormal   Collection Time: 10/12/14  3:55 PM  Result Value Ref Range Status   MRSA, PCR NEGATIVE NEGATIVE Final   Staphylococcus aureus POSITIVE (A) NEGATIVE Final    Comment:        The Xpert SA Assay (FDA approved for NASAL specimens in patients over 22 years of age), is one component of a comprehensive surveillance program.  Test performance has been validated by [facility] for patients greater than or equal to 71 year old. It is not intended to diagnose infection nor to guide or monitor treatment. RESULT CALLED TO, READ BACK BY AND VERIFIED WITH:  TO RHAHUMMOND(RN) BY TCLEVELAND 10/12/2014 AT 9:31PM   Anaerobic culture     Status: None (Preliminary result)   Collection Time: 10/12/14  8:50 PM  Result Value Ref Range Status   Specimen Description ABSCESS LEFT FINGER  Final   Special Requests PT ON VANCOMYCIN  Final   Gram Stain   Final    RARE WBC PRESENT, PREDOMINANTLY PMN NO SQUAMOUS EPITHELIAL CELLS SEEN RARE GRAM POSITIVE COCCI IN PAIRS Performed at [facility]    Culture   Final    NO ANAEROBES ISOLATED; CULTURE IN PROGRESS FOR 5 DAYS Performed at [facility]    Report Status PENDING  Incomplete  AFB culture with smear     Status: None (Preliminary result)   Collection Time: 10/12/14  8:50 PM  Result Value Ref Range Status   Specimen Description ABSCESS LEFT FINGER  Final   Special Requests PT ON VANCOMYCIN,SPECIMEN A  Final   Acid Fast Smear   Final    NO ACID FAST BACILLI SEEN Performed at [facility]    Culture   Final    CULTURE WILL BE EXAMINED FOR 6 WEEKS BEFORE ISSUING A FINAL REPORT Performed at [facility]    Report Status PENDING  Incomplete  Culture, routine-abscess     Status: None (Preliminary result)   Collection Time: 10/12/14  8:50 PM  Result Value Ref Range Status   Specimen Description ABSCESS LEFT FINGER  Final   Special Requests PT ON VANCOMYCIN,SPECIMEN A  Final   Gram Stain   Final    RARE WBC PRESENT, PREDOMINANTLY PMN NO SQUAMOUS EPITHELIAL CELLS SEEN RARE GRAM POSITIVE COCCI IN PAIRS Performed at [facility]    Culture PENDING  Incomplete   Report Status PENDING  Incomplete  Anaerobic culture     Status: None (Preliminary result)   Collection Time: 10/12/14  8:52 PM  Result Value Ref Range Status   Specimen Description TISSUE LEFT FINGER  Final   Special Requests PT ON VANCOMYCIN,SPECIMEN B  Final   Gram Stain   Final    RARE WBC PRESENT,BOTH PMN AND MONONUCLEAR NO ORGANISMS SEEN Performed at [facility]    Culture   Final    NO ANAEROBES ISOLATED; CULTURE IN PROGRESS FOR 5 DAYS Performed at [facility]    Report Status PENDING  Incomplete  Tissue culture     Status: None (Preliminary result)   Collection Time: 10/12/14  8:52 PM  Result Value  Ref Range Status   Specimen Description TISSUE LEFT FINGER  Final   Special Requests PT ON VANCOMYCIN,SPECIMEN B  Final   Gram Stain   Final    RARE WBC PRESENT,BOTH PMN AND MONONUCLEAR NO ORGANISMS SEEN Performed at [facility]    Culture PENDING  Incomplete   Report Status PENDING   Incomplete  AFB culture with smear     Status: None (Preliminary result)   Collection Time: 10/12/14  8:52 PM  Result Value Ref Range Status   Specimen Description TISSUE LEFT FINGER  Final   Special Requests PT ON VANCOMYCIN,SPECIMEN B  Final   Acid Fast Smear   Final    NO ACID FAST BACILLI SEEN Performed at [facility]    Culture   Final    CULTURE WILL BE EXAMINED FOR 6 WEEKS BEFORE ISSUING A FINAL REPORT Performed at [facility]    Report Status PENDING  Incomplete    Radiology Reports Dg Hand Complete Left  10/12/2014   CLINICAL DATA:  Swelling of the left second and CP joint. No known injury. Evaluate for osteomyelitis versus septic joint.  EXAM: LEFT HAND - COMPLETE 3+ VIEW  COMPARISON:  None.  FINDINGS: No fracture or dislocation with special attention paid to the second digit. Joint spaces are preserved. No erosions.  There is mild apparent soft tissue swelling about the second MCP joint, best appreciated on lateral radiograph. This finding is without associated osteolysis, radiopaque foreign body or subcutaneous emphysema.  Scattered extensive vascular calcifications about the imaged distal forearm and hand.  IMPRESSION: Soft tissue swelling about the second MCP joint without associated fracture radiographic evidence of osteomyelitis.   Electronically Signed   By: Sandi Mariscal M.D.   On: 10/12/2014 09:52    CBC  Recent Labs Lab 10/12/14 0913 10/13/14 0727  WBC 10.6* 8.7  HGB 12.8* 12.0*  HCT 39.0 36.9*  PLT 154 143*  MCV 100.8* 100.3*  MCH 33.1 32.6  MCHC 32.8 32.5  RDW 14.0 13.9  LYMPHSABS 0.9  --   MONOABS 0.8  --   EOSABS 0.0  --   BASOSABS 0.0  --     Chemistries   Recent Labs Lab 10/12/14 0913 10/13/14 0727  NA 140 140  K 3.1* 4.0  CL 108 112  CO2 23 21  GLUCOSE 125* 90  BUN 15 13  CREATININE 1.05 0.91  CALCIUM 8.1* 8.1*    ------------------------------------------------------------------------------------------------------------------ estimated creatinine clearance is 69 mL/min (by C-G formula based on Cr of 0.91). ------------------------------------------------------------------------------------------------------------------ No results for input(s): HGBA1C in the last 72 hours. ------------------------------------------------------------------------------------------------------------------ No results for input(s): CHOL, HDL, LDLCALC, TRIG, CHOLHDL, LDLDIRECT in the last 72 hours. ------------------------------------------------------------------------------------------------------------------ No results for input(s): TSH, T4TOTAL, T3FREE, THYROIDAB in the last 72 hours.  Invalid input(s): FREET3 ------------------------------------------------------------------------------------------------------------------ No results for input(s): VITAMINB12, FOLATE, FERRITIN, TIBC, IRON, RETICCTPCT in the last 72 hours.  Coagulation profile  Recent Labs Lab 10/12/14 0913 10/13/14 0727  INR 2.70* 2.75*    No results for input(s): DDIMER in the last 72 hours.  Cardiac Enzymes No results for input(s): CKMB, TROPONINI, MYOGLOBIN in the last 168 hours.  Invalid input(s): CK ------------------------------------------------------------------------------------------------------------------ Invalid input(s): POCBNP     Time Spent in minutes   35 minutes   Brandin Stetzer M.D on 10/13/2014 at 3:07 PM  Between 7am to 7pm - Pager - [phone]  After 7pm go to [email or web address] - password [email or web address]  And look for the night coverage person covering for me after hours  [facility] Office  [phone]   **Disclaimer: This note may have been dictated with voice recognition software. Similar sounding words can inadvertently be transcribed and this note may contain transcription errors which may not  have been corrected upon publication of note.**

## 2014-10-13 NOTE — Progress Notes (Signed)
ANTICOAGULATION & ANTIBIOTIC CONSULT NOTE - Follow Up Consult  Pharmacy Consult for Warfarin + Vancomycin Indication: atrial fibrillation + cellulitis  No Known Allergies  Patient Measurements: Height: 6\' 3"  (190.5 cm) Weight: 184 lb 8.4 oz (83.7 kg) IBW/kg (Calculated) : 84.5  Vital Signs: Temp: 98.3 F (36.8 C) (03/17 0552) BP: 145/95 mmHg (03/17 0552)  Labs:  Recent Labs  10/12/14 0913 10/13/14 0727  HGB 12.8* 12.0*  HCT 39.0 36.9*  PLT 154 143*  LABPROT 28.9* 29.3*  INR 2.70* 2.75*  CREATININE 1.05 0.91    Estimated Creatinine Clearance: 69 mL/min (by C-G formula based on Cr of 0.91).   Assessment: 30 YOM who presented on 3/16 with L hand/arm cellulitis and underwent I&D of the L-index finger by ortho with cultures sent. The patient remains on Vancomycin for empiric cellulitis coverage. Given the patient's updated weight and current renal function - will adjust the dose slightly today. Imaging thus far shows no osteo.  The patient also continues on warfarin for hx Afib with a therapeutic INR this morning on the patient's PTA dose of 5 mg daily. Hgb/Hct/Plt slight drop - no overt s/sx of bleeding noted at this time.   Goal of Therapy:  INR 2-3  Vancomycin trough of 10-15 mcg/ml Proper antibiotics for infection/cultures adjusted for renal/hepatic function    Plan:  1. Warfarin 5 mg x 1 dose at 1800 today 2. Adjust Vancomycin to 1500 mg IV every 24 hours 3. Will continue to monitor for any signs/symptoms of bleeding and will follow up with PT/INR in the a.m. 4. Will continue to follow renal function, culture results, LOT, and antibiotic de-escalation plans   Alycia Rossetti, PharmD, BCPS Clinical Pharmacist Pager: [phone] 10/13/2014 11:29 AM

## 2014-10-13 NOTE — Op Note (Signed)
NAMECLEBURNE, SAVINI NO.:  [id number]  MEDICAL RECORD NO.:  [id number]  LOCATION:  [id number]                        FACILITY:  [place]  PHYSICIAN:  Melrose Nakayama, MD  DATE OF BIRTH:  [birth year]  DATE OF PROCEDURE:  10/12/2014 DATE OF DISCHARGE:                              OPERATIVE REPORT   PREOPERATIVE DIAGNOSIS:  Left index finger metacarpophalangeal infectious arthritis.  POSTOPERATIVE DIAGNOSIS:  Left index finger metacarpophalangeal infectious arthritis.  ATTENDING PHYSICIAN:  Melrose Nakayama, MD, scrubbed and present for the entire procedure.  ASSISTANT SURGEON:  None.  ANESTHESIA:  Axillary block with IV sedation.  SURGICAL PROCEDURE: 1. Left index finger metacarpophalangeal joint arthrotomy,     exploration, and drainage. 2. Left index finger tenosynovectomy, EIP and EDC to the index finger.  SURGICAL INDICATIONS:  Mr. Stigger is a right-hand-dominant gentleman with myasthenia gravis, on anticoagulation Coumadin therapy and prednisone who presented with increased redness and swelling to his left index finger MP joint.  The patient had ascending redness and swelling. Based on his degree of pain and discomfort, it was recommended that he undergo the above procedure.  Risks, benefits, and alternatives were discussed in detail with the patient and signed informed consent was obtained.  Risks include, but not limited to bleeding, infection, damage to nearby nerves, arteries, or tendons, loss of motion to the wrist and digits, incomplete relief of symptoms, and need for further surgical intervention.  DESCRIPTION OF PROCEDURE:  The patient was properly identified in the preoperative holding area and mark with a permanent marker made on the left index finger to indicate the correct operative site.  The patient was then brought back to the operating room, placed supine on the anesthesia room table.  The patient had previously undergone  axillary block.  A well-padded tourniquet was placed on left brachium, sealed with 1000 drape.  The left upper extremity was then prepped and draped in normal sterile fashion.  Time-out was called.  Correct site was identified, and procedure then begun.  Attention was then turned to left index finger where a curvilinear incision was made directly over the MP joint.  Tourniquet insufflated.  Deep dissection carried down through the skin and subcutaneous tissue.  The patient did have fluid collection over the extensor tendons, and this fluid was sent for culture.  The patient did have what appeared to be infectious tissue directly over the EIP and EDC to the index and tenosynovectomy was then carried out of both tendons.  The patient did have the degenerative changes noted with the MP joint and the arthrotomy was then opened up, and the patient did have a small amount of fluid within the joint that did not appear to be grossly infected.  Arthrotomy and drainage of the MP joint was then carried out.  The wound was then thoroughly irrigated.  Copious wound irrigation done.  Following this after tenosynovectomy and drainage of the concerned region, the wound was then loosely reapproximated with 3-0 Prolene sutures.  Xeroform dressing was then applied.  Sterile compressive bandage then applied.  The patient was then placed in a well- padded volar splint keeping his fingers in full extension.  Taken to recovery room in good condition.  POSTPROCEDURAL PLAN:  The patient admitted back to the Internal Medicine Service.  Continue on the IV antibiotics.  We will follow his wound cultures and tissue cultures.  Wound check within 48 hours and we will see how he responds to the therapy.     Melrose Nakayama, MD     FWO/MEDQ  D:  10/12/2014  T:  10/13/2014  Job:  [id number]

## 2014-10-14 ENCOUNTER — Ambulatory Visit: Payer: Medicare Other | Admitting: Internal Medicine

## 2014-10-14 LAB — CBC
HCT: 36.8 % — ABNORMAL LOW (ref 39.0–52.0)
HEMOGLOBIN: 12.1 g/dL — AB (ref 13.0–17.0)
MCH: 33 pg (ref 26.0–34.0)
MCHC: 32.9 g/dL (ref 30.0–36.0)
MCV: 100.3 fL — ABNORMAL HIGH (ref 78.0–100.0)
Platelets: 148 10*3/uL — ABNORMAL LOW (ref 150–400)
RBC: 3.67 MIL/uL — ABNORMAL LOW (ref 4.22–5.81)
RDW: 13.8 % (ref 11.5–15.5)
WBC: 7.2 10*3/uL (ref 4.0–10.5)

## 2014-10-14 LAB — PROTIME-INR
INR: 2.27 — AB (ref 0.00–1.49)
PROTHROMBIN TIME: 25.3 s — AB (ref 11.6–15.2)

## 2014-10-14 LAB — BASIC METABOLIC PANEL
Anion gap: 8 (ref 5–15)
BUN: 10 mg/dL (ref 6–23)
CHLORIDE: 107 mmol/L (ref 96–112)
CO2: 26 mmol/L (ref 19–32)
Calcium: 8.7 mg/dL (ref 8.4–10.5)
Creatinine, Ser: 0.85 mg/dL (ref 0.50–1.35)
GFR, EST AFRICAN AMERICAN: 89 mL/min — AB (ref >90)
GFR, EST NON AFRICAN AMERICAN: 77 mL/min — AB (ref >90)
GLUCOSE: 83 mg/dL (ref 70–99)
POTASSIUM: 3.6 mmol/L (ref 3.5–5.1)
Sodium: 141 mmol/L (ref 135–145)

## 2014-10-14 LAB — GLUCOSE, CAPILLARY
Glucose-Capillary: 93 mg/dL (ref 70–99)
Glucose-Capillary: 122 mg/dL — ABNORMAL HIGH (ref 70–99)
Glucose-Capillary: 138 mg/dL — ABNORMAL HIGH (ref 70–99)

## 2014-10-14 MED ORDER — WARFARIN SODIUM 5 MG PO TABS
5.0000 mg | ORAL_TABLET | Freq: Once | ORAL | Status: AC
  Administered 2014-10-14: 5 mg via ORAL
  Filled 2014-10-14: qty 1

## 2014-10-14 MED ORDER — DOXYCYCLINE HYCLATE 100 MG PO TABS: 100.0000 mg | ORAL_TABLET | Freq: Two times a day (BID) | ORAL | Status: DC

## 2014-10-14 NOTE — Progress Notes (Deleted)
Patient Demographics  Richard Bradley, is a 81 y.o. male, DOB - [birth year], WUJ:[id number]  Admit date - 10/12/2014   Admitting Physician Thurnell Lose, MD  Outpatient Primary MD for the patient is Richard PCP Per Patient  LOS - 2   Chief Complaint  Patient presents with  . Cellulitis      Admission history of present illness/brief narrative: Richard Bradley is a 81 y.o. male, history of myasthenia gravis chronically on steroid, chronic atrial fibrillation on Coumadin, prostate cancer, urinary incontinence, GERD, chronic anxiety, who is visiting his daughter from [place] comes in with 3 day history of hand redness which is gradually extending into his elbow, swelling over his left second MP joint, he was prescribed with Keflex without much benefit, his redness and warmth continued to get worse and he came to the ER. In the ER workup suggests leukocytosis, effusion and cellulitis over the left second MP joint along with left hand and arm, he chronically runs low blood pressure according to the daughter his systolic is around 956, in the ER it was in 84s, he had mild sepsis per his clinical presentation, Patient  denies any injuries to his left hand or arm. Does not recall if he bumped his left hand or arm, patient was started on IV vancomycin, was seen by orthopedic Dr. Apolonio Schneiders, Left index finger metacarpophalangeal joint arthrotomy, exploration, and drainage.with Left index finger tenosynovectomy.   Subjective:   Richard Bradley today has, Richard headache, Richard chest pain, Richard abdominal pain - Richard Nausea, Richard new weakness tingling or numbness, Richard Cough - SOB.   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 - Patient had index finger metacarpophalangeal joint arthrotomy, exploration, and drainage.with Left index finger tenosynovectomy on 3/16 by Dr. Apolonio Schneiders - Continue with IV vancomycin. -  wound cultures, showing  few Staphylococcus aureus,    Myasthenia gravis.  - Stable. Continue home dose prednisone  Chronic atrial fibrillation CHADS2 VASC score 1-2  -- continue beta blocker as tolerated by blood pressure, Coumadin dosed by pharmacy,   History of prostate cancer.  - Stable Richard acute issues.  Hypokalemia - Repleted   Code Status: Full  Family Communication: none at bedside  Disposition Plan: Home once stable   Procedures   Left index finger metacarpophalangeal joint arthrotomy, exploration, and drainage.with Left index finger tenosynovectomy 3/16. Consults   Hand surgery   Medications  Scheduled Meds: . calcium-vitamin D  1 tablet Oral BID WC  . Chlorhexidine Gluconate Cloth  6 each Topical Q0600  . clonazePAM  0.25 mg Oral QHS  . fluticasone  2 spray Each Nare Daily  . insulin aspart  0-9 Units Subcutaneous TID WC  . metoprolol  50 mg Oral BID  . mupirocin ointment   Nasal BID  . pantoprazole  40 mg Oral Daily  . polyethylene glycol  17 g Oral Daily  . predniSONE  20  mg Oral Q breakfast  . sodium chloride  3 mL Intravenous Q12H  . vancomycin  1,500 mg Intravenous Q24H  . vitamin B-12  1,000 mcg Oral Daily  . warfarin  5 mg Oral ONCE-1800  . Warfarin - Pharmacist Dosing Inpatient   Does not apply q1800   Continuous Infusions:  PRN Meds:.alum & mag hydroxide-simeth, fluticasone, guaiFENesin-dextromethorphan, HYDROcodone-acetaminophen, ondansetron **OR** ondansetron (ZOFRAN) IV  DVT Prophylaxis  on warfarin  Lab Results  Component Value Date   PLT 148* 10/14/2014    Antibiotics     Anti-infectives    Start     Dose/Rate Route Frequency Ordered Stop   10/13/14 1300  vancomycin (VANCOCIN) 1,250 mg in sodium chloride 0.9 % 250 mL IVPB  Status:  Discontinued     1,250 mg 166.7 mL/hr over 90 Minutes Intravenous Every 24 hours 10/12/14 1140 10/13/14 1129   10/13/14 1300  vancomycin (VANCOCIN) 1,500 mg in sodium chloride 0.9 % 500 mL IVPB     1,500 mg 250 mL/hr over 120 Minutes Intravenous Every 24 hours 10/13/14 1129     10/12/14 1100  vancomycin (VANCOCIN) 1,500 mg in sodium chloride 0.9 % 500 mL IVPB     1,500 mg 250 mL/hr over 120 Minutes Intravenous  Once 10/12/14 1051 10/12/14 1348          Objective:   Filed Vitals:   10/13/14 1527 10/13/14 2138 10/14/14 0523 10/14/14 0700  BP: 129/82 140/87 121/78   Pulse: 96 104 107   Temp: 98.8 F (37.1 C) 97.6 F (36.4 C) 98.4 F (36.9 C)   TempSrc: Oral Oral Oral   Resp: 19 14    Height:      Weight:    82 kg (180 lb 12.4 oz)  SpO2: 95% 98% 90%     Wt Readings from Last 3 Encounters:  10/14/14 82 kg (180 lb 12.4 oz)  08/12/14 77.111 kg (170 lb)  07/30/14 79.289 kg (174 lb 12.8 oz)     Intake/Output Summary (Last 24 hours) at 10/14/14 1301 Last data filed at 10/14/14 0944  Gross per 24 hour  Intake    975 ml  Output    600 ml  Net    375 ml     Physical Exam  Awake Alert, Oriented X 3, Richard new F.N deficits, Normal affect Richard Bradley,Richard Bradley,Richard Bradley, Richard Bradley.  Symmetrical Chest wall movement, Good air movement bilaterally, CTAB RRR,Richard Gallops,Rubs or new Murmurs, Richard Parasternal Heave +ve B.Sounds, Abd Soft, Richard tenderness, Richard organomegaly Bradley, Richard rebound - guarding or rigidity. Richard Cyanosis, Clubbing or edema, Richard new Rash or bruise , right hand in bandage and Ace wrap   Data Review   Micro Results Recent Results (from the past 240 hour(s))  Culture, blood (routine x 2)     Status: None (Preliminary result)   Collection Time: 10/12/14 10:15 AM   Result Value Ref Range Status   Specimen Description BLOOD RIGHT HAND  Final   Special Requests BOTTLES DRAWN AEROBIC AND ANAEROBIC 5CC  Final   Culture   Final           BLOOD CULTURE RECEIVED Richard GROWTH TO DATE CULTURE WILL BE HELD FOR 5 DAYS BEFORE ISSUING A FINAL NEGATIVE REPORT Performed at [facility]    Report Status PENDING  Incomplete  Culture, blood (routine x 2)     Status: None (Preliminary result)   Collection Time: 10/12/14 10:20 AM  Result Value Ref Range Status  Specimen Description BLOOD LEFT HAND  Final   Special Requests BOTTLES DRAWN AEROBIC AND ANAEROBIC 5CC  Final   Culture   Final           BLOOD CULTURE RECEIVED Richard GROWTH TO DATE CULTURE WILL BE HELD FOR 5 DAYS BEFORE ISSUING A FINAL NEGATIVE REPORT Performed at [facility]    Report Status PENDING  Incomplete  Surgical PCR screen     Status: Abnormal   Collection Time: 10/12/14  3:55 PM  Result Value Ref Range Status   MRSA, PCR NEGATIVE NEGATIVE Final   Staphylococcus aureus POSITIVE (A) NEGATIVE Final    Comment:        The Xpert SA Assay (FDA approved for NASAL specimens in patients over 62 years of age), is one component of a comprehensive surveillance program.  Test performance has been validated by [facility] for patients greater than or equal to 58 year old. It is not intended to diagnose infection nor to guide or monitor treatment. RESULT CALLED TO, READ BACK BY AND VERIFIED WITH:  TO RHAHUMMOND(RN) BY TCLEVELAND 10/12/2014 AT 9:31PM   Anaerobic culture     Status: None (Preliminary result)   Collection Time: 10/12/14  8:50 PM  Result Value Ref Range Status   Specimen Description ABSCESS LEFT FINGER  Final   Special Requests PT ON VANCOMYCIN  Final   Gram Stain   Final    RARE WBC PRESENT, PREDOMINANTLY PMN Richard SQUAMOUS EPITHELIAL CELLS SEEN RARE GRAM POSITIVE COCCI IN PAIRS Performed at [facility]    Culture   Final    Richard ANAEROBES ISOLATED; CULTURE IN  PROGRESS FOR 5 DAYS Performed at [facility]    Report Status PENDING  Incomplete  Fungus Culture with Smear     Status: None (Preliminary result)   Collection Time: 10/12/14  8:50 PM  Result Value Ref Range Status   Specimen Description ABSCESS LEFT FINGER  Final   Special Requests PT ON VANCOMYCIN  Final   Fungal Smear   Final    Richard YEAST OR FUNGAL ELEMENTS SEEN Performed at [facility]    Culture   Final    CULTURE IN PROGRESS FOR FOUR WEEKS Performed at [facility]    Report Status PENDING  Incomplete  AFB culture with smear     Status: None (Preliminary result)   Collection Time: 10/12/14  8:50 PM  Result Value Ref Range Status   Specimen Description ABSCESS LEFT FINGER  Final   Special Requests PT ON VANCOMYCIN,SPECIMEN A  Final   Acid Fast Smear   Final    Richard ACID FAST BACILLI SEEN Performed at [facility]    Culture   Final    CULTURE WILL BE EXAMINED FOR 6 WEEKS BEFORE ISSUING A FINAL REPORT Performed at [facility]    Report Status PENDING  Incomplete  Culture, routine-abscess     Status: None (Preliminary result)   Collection Time: 10/12/14  8:50 PM  Result Value Ref Range Status   Specimen Description ABSCESS LEFT FINGER  Final   Special Requests PT ON VANCOMYCIN,SPECIMEN A  Final   Gram Stain   Final    RARE WBC PRESENT, PREDOMINANTLY PMN Richard SQUAMOUS EPITHELIAL CELLS SEEN RARE GRAM POSITIVE COCCI IN PAIRS Performed at [facility]    Culture   Final    FEW STAPHYLOCOCCUS AUREUS Note: RIFAMPIN AND GENTAMICIN SHOULD NOT BE USED AS SINGLE DRUGS FOR TREATMENT OF STAPH INFECTIONS.  Performed at [facility]    Report Status PENDING  Incomplete  Anaerobic culture     Status: None (Preliminary result)   Collection Time: 10/12/14  8:52 PM  Result Value Ref Range Status   Specimen Description TISSUE LEFT FINGER  Final   Special Requests PT ON VANCOMYCIN,SPECIMEN B  Final   Gram Stain   Final     RARE WBC PRESENT,BOTH PMN AND MONONUCLEAR Richard ORGANISMS SEEN Performed at [facility]    Culture   Final    Richard ANAEROBES ISOLATED; CULTURE IN PROGRESS FOR 5 DAYS Performed at [facility]    Report Status PENDING  Incomplete  Fungus Culture with Smear     Status: None (Preliminary result)   Collection Time: 10/12/14  8:52 PM  Result Value Ref Range Status   Specimen Description TISSUE LEFT FINGER  Final   Special Requests PT ON VANCOMYCIN,SPECIMEN B  Final   Fungal Smear   Final    Richard YEAST OR FUNGAL ELEMENTS SEEN Performed at [facility]    Culture   Final    CULTURE IN PROGRESS FOR FOUR WEEKS Performed at [facility]    Report Status PENDING  Incomplete  Tissue culture     Status: None (Preliminary result)   Collection Time: 10/12/14  8:52 PM  Result Value Ref Range Status   Specimen Description TISSUE LEFT FINGER  Final   Special Requests PT ON VANCOMYCIN,SPECIMEN B  Final   Gram Stain   Final    RARE WBC PRESENT,BOTH PMN AND MONONUCLEAR Richard ORGANISMS SEEN Performed at [facility]    Culture   Final    RARE STAPHYLOCOCCUS AUREUS Note: RIFAMPIN AND GENTAMICIN SHOULD NOT BE USED AS SINGLE DRUGS FOR TREATMENT OF STAPH INFECTIONS. Performed at [facility]    Report Status PENDING  Incomplete  AFB culture with smear     Status: None (Preliminary result)   Collection Time: 10/12/14  8:52 PM  Result Value Ref Range Status   Specimen Description TISSUE LEFT FINGER  Final   Special Requests PT ON VANCOMYCIN,SPECIMEN B  Final   Acid Fast Smear   Final    Richard ACID FAST BACILLI SEEN Performed at [facility]    Culture   Final    CULTURE WILL BE EXAMINED FOR 6 WEEKS BEFORE ISSUING A FINAL REPORT Performed at [facility]    Report Status PENDING  Incomplete    Radiology Reports Richard results found.  CBC  Recent Labs Lab 10/12/14 0913 10/13/14 0727 10/14/14 0830  WBC 10.6* 8.7 7.2  HGB 12.8* 12.0*  12.1*  HCT 39.0 36.9* 36.8*  PLT 154 143* 148*  MCV 100.8* 100.3* 100.3*  MCH 33.1 32.6 33.0  MCHC 32.8 32.5 32.9  RDW 14.0 13.9 13.8  LYMPHSABS 0.9  --   --   MONOABS 0.8  --   --   EOSABS 0.0  --   --   BASOSABS 0.0  --   --     Chemistries   Recent Labs Lab 10/12/14 0913 10/13/14 0727 10/14/14 0830  NA 140 140 141  K 3.1* 4.0 3.6  CL 108 112 107  CO2 23 21 26   GLUCOSE 125* 90 83  BUN 15 13 10   CREATININE 1.05 0.91 0.85  CALCIUM 8.1* 8.1* 8.7   ------------------------------------------------------------------------------------------------------------------ estimated creatinine clearance is 72.4 mL/min (by C-G formula based on Cr of 0.85). ------------------------------------------------------------------------------------------------------------------ Richard results for input(s): HGBA1C in the last  72 hours. ------------------------------------------------------------------------------------------------------------------ Richard results for input(s): CHOL, HDL, LDLCALC, TRIG, CHOLHDL, LDLDIRECT in the last 72 hours. ------------------------------------------------------------------------------------------------------------------ Richard results for input(s): TSH, T4TOTAL, T3FREE, THYROIDAB in the last 72 hours.  Invalid input(s): FREET3 ------------------------------------------------------------------------------------------------------------------ Richard results for input(s): VITAMINB12, FOLATE, FERRITIN, TIBC, IRON, RETICCTPCT in the last 72 hours.  Coagulation profile  Recent Labs Lab 10/12/14 0913 10/13/14 0727 10/14/14 0830  INR 2.70* 2.75* 2.27*    Richard results for input(s): DDIMER in the last 72 hours.  Cardiac Enzymes Richard results for input(s): CKMB, TROPONINI, MYOGLOBIN in the last 168 hours.  Invalid input(s): CK ------------------------------------------------------------------------------------------------------------------ Invalid input(s): POCBNP     Time  Spent in minutes   35 minutes   Raeden Schippers M.D on 10/14/2014 at 1:01 PM  Between 7am to 7pm - Pager - [phone]  After 7pm go to [email or web address] - password [email or web address]  And look for the night coverage person covering for me after hours  [facility] Office  [phone]   **Disclaimer: This note may have been dictated with voice recognition software. Similar sounding words can inadvertently be transcribed and this note may contain transcription errors which may not have been corrected upon publication of note.**

## 2014-10-14 NOTE — Progress Notes (Signed)
PT SEEN/EXAMINED LEFT HAND LOOKS BETTER, NO PURULENCE.LESS SWELLING AND REDNESS NEW DRESSING APPLIED SPLINT REAPPLIED CULTURES SHOW FEW STAPH AUREUS GROWING OK TO GO HOME FROM MY STANDPOINT ON ORAL ABX. WILL NEED TO SEE IN MY OFFICE ON Tuesday DAUGHTER PRESENT FOR DISCUSSION DAUGHTER HAS MY NUMBER IF ANY PROBLEMS ARISE

## 2014-10-14 NOTE — Progress Notes (Signed)
Medicare Important Message given?  YES (If response is "NO", the following Medicare IM given date fields will be blank) Date Medicare IM given:  10/14/14 Medicare IM given by:  Mikel Hardgrove 

## 2014-10-14 NOTE — Discharge Instructions (Signed)
KEEP BANDAGE CLEAN AND DRY CALL OFFICE FOR F/U APPT [phone] ON TUESDSAY KEEP HAND ELEVATED ABOVE HEART OK TO APPLY ICE TO OPERATIVE AREA CONTACT OFFICE IF ANY WORSENING PAIN OR CONCERNS. Abscess An abscess is an infected area that contains a collection of pus and debris.It can occur in almost any part of the body. An abscess is also known as a furuncle or boil. CAUSES  An abscess occurs when tissue gets infected. This can occur from blockage of oil or sweat glands, infection of hair follicles, or a minor injury to the skin. As the body tries to fight the infection, pus collects in the area and creates pressure under the skin. This pressure causes pain. People with weakened immune systems have difficulty fighting infections and get certain abscesses more often.  SYMPTOMS Usually an abscess develops on the skin and becomes a painful mass that is red, warm, and tender. If the abscess forms under the skin, you may feel a moveable soft area under the skin. Some abscesses break open (rupture) on their own, but most will continue to get worse without care. The infection can spread deeper into the body and eventually into the bloodstream, causing you to feel ill.  DIAGNOSIS  Your caregiver will take your medical history and perform a physical exam. A sample of fluid may also be taken from the abscess to determine what is causing your infection. TREATMENT  Your caregiver may prescribe antibiotic medicines to fight the infection. However, taking antibiotics alone usually does not cure an abscess. Your caregiver may need to make a small cut (incision) in the abscess to drain the pus. In some cases, gauze is packed into the abscess to reduce pain and to continue draining the area. HOME CARE INSTRUCTIONS   Only take over-the-counter or prescription medicines for pain, discomfort, or fever as directed by your caregiver.  If you were prescribed antibiotics, take them as directed. Finish them even if you start  to feel better.  If gauze is used, follow your caregiver's directions for changing the gauze.  To avoid spreading the infection:  Keep your draining abscess covered with a bandage.  Wash your hands well.  Do not share personal care items, towels, or whirlpools with others.  Avoid skin contact with others.  Keep your skin and clothes clean around the abscess.  Keep all follow-up appointments as directed by your caregiver. SEEK MEDICAL CARE IF:   You have increased pain, swelling, redness, fluid drainage, or bleeding.  You have muscle aches, chills, or a general ill feeling.  You have a fever. MAKE SURE YOU:   Understand these instructions.  Will watch your condition.  Will get help right away if you are not doing well or get worse. Document Released: 04/24/2005 Document Revised: 01/14/2012 Document Reviewed: 09/27/2011 [facility] Patient Information 2015 [place], [place]. This information is not intended to replace advice given to you by your health care provider. Make sure you discuss any questions you have with your health care provider.

## 2014-10-14 NOTE — Care Management Note (Signed)
    Page 1 of 1   10/15/2014     7:24:53 AM CARE MANAGEMENT NOTE 10/15/2014  Patient:  VIPUL, CAFARELLI   Account Number:  [id number]  Date Initiated:  10/14/2014  Documentation initiated by:  Tomi Bamberger  Subjective/Objective Assessment:   dx cellulitis  admit- from home     Action/Plan:   pt eval-no f/u   Anticipated DC Date:  10/14/2014   Anticipated DC Plan:  [place]  CM consult      Choice offered to / List presented to:             Status of service:  Completed, signed off Medicare Important Message given?  YES (If response is "NO", the following Medicare IM given date fields will be blank) Date Medicare IM given:  10/14/2014 Medicare IM given by:  Tomi Bamberger Date Additional Medicare IM given:   Additional Medicare IM given by:    Discharge Disposition:  HOME/SELF CARE  Per UR Regulation:  Reviewed for med. necessity/level of care/duration of stay  If discussed at [place] of Stay Meetings, dates discussed:    Comments:  10/14/14 [place], BSN [phone] NCM spoke with hand surgeon he states patient will not probably need HHRN because he will see patient in his office.

## 2014-10-14 NOTE — Progress Notes (Signed)
WILL BE BY LATER TODAY TO EXAMINE WOUNDS CONTINUE WITH INPATIENT CARE UNTIL WOUND ASSESSMENT IV ABX CONTACT ME DIRECTLY IF QUESTIONS [phone]

## 2014-10-14 NOTE — Progress Notes (Signed)
ANTICOAGULATION CONSULT NOTE - Follow Up Consult  Pharmacy Consult for Warfarin  Indication: atrial fibrillation  No Known Allergies  Patient Measurements: Height: 6\' 3"  (190.5 cm) Weight: 180 lb 12.4 oz (82 kg) IBW/kg (Calculated) : 84.5  Vital Signs: Temp: 98.4 F (36.9 C) (03/18 0523) Temp Source: Oral (03/18 0523) BP: 121/78 mmHg (03/18 0523) Pulse Rate: 107 (03/18 0523)  Labs:  Recent Labs  10/12/14 0913 10/13/14 0727 10/14/14 0830  HGB 12.8* 12.0* 12.1*  HCT 39.0 36.9* 36.8*  PLT 154 143* 148*  LABPROT 28.9* 29.3* 25.3*  INR 2.70* 2.75* 2.27*  CREATININE 1.05 0.91 0.85    Estimated Creatinine Clearance: 72.4 mL/min (by C-G formula based on Cr of 0.85).   Assessment: 25 YOM continues on warfarin for hx Afib with a therapeutic INR this morning on the patient's PTA dose of 5 mg daily. H/H and platelets are slightly low but stable. No bleeding noted.   Goal of Therapy:  INR 2-3    Plan:  1. Repeat Warfarin 5 mg x 1 dose at 1800 today 2. F/u AM INR  Salome Arnt, PharmD, BCPS Pager # [phone] 10/14/2014 9:56 AM

## 2014-10-14 NOTE — Discharge Summary (Signed)
Richard Bradley, 81 y.o., DOB Aug 03, 1925, MRN [id number]. 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 Kidney stones   . Urine incontinence   . Atrial fibrillation   . Osteoarthritis   . Prostate cancer dx'd 1982  . Myasthenia gravis   . Dysrhythmia     hx of atrial fibrilation  . Cellulitis     left arm    Past Surgical History  Procedure Laterality Date  . Prostate surgery    . Hernia repair    . Total knee arthroplasty    . Rotator cuff repair    . Appendectomy    . Appendectomy    . Laminectomy  2005 ?    lumbar  . Kyphoplasty  2012 ?  . 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 male, history of myasthenia gravis chronically on steroid, chronic atrial fibrillation on Coumadin, prostate cancer, urinary incontinence, GERD, chronic anxiety, who is visiting his  daughter from [place] comes in with 3 day history of hand redness which is gradually extending into his elbow, swelling over his left second MP joint, he was prescribed with Keflex without much benefit, his redness and warmth continued to get worse and he came to the ER. In the ER workup suggests leukocytosis, effusion and cellulitis over the left second MP joint along with left hand and arm, he chronically runs low blood pressure according to the daughter his systolic is around 765, in the ER it was in 23s, he had mild sepsis per his clinical presentation, Patient denies any injuries to his left hand or arm. Does not recall if he bumped his left hand or arm, patient was started on IV vancomycin, was seen by orthopedic Dr. Apolonio Schneiders, Left index finger metacarpophalangeal joint arthrotomy, exploration, and drainage.with Left index finger tenosynovectomy.  Left hand cellulitis, with Left index finger metacarpophalangeal infectious arthritis. - Patient had index finger metacarpophalangeal joint arthrotomy, exploration, and drainage.with Left index finger tenosynovectomy on 3/16 by Dr. Apolonio Schneiders - treated with IV vancomycin in hospital stay, continue another 10 days of oral doxycycline as an outpatient, follow up with Dr. Apolonio Schneiders as an outpatient this coming Tuesday. - wound cultures, showing few Staphylococcus aureus,    Myasthenia gravis.  - Stable. Continue home dose prednisone  Chronic atrial fibrillation CHADS2 VASC score 1-2  - Continue with  beta blockers, continue with warfarin .  History of prostate cancer.  - Stable no acute issues.  Hypokalemia - Repleted  Procedures   Left index finger metacarpophalangeal joint arthrotomy, exploration, and drainage.with Left index finger tenosynovectomy 3/16. Consults  Orthopedics   Significant Tests:  See full reports for all details   Dg Hand Complete Left  10/12/2014   CLINICAL DATA:  Swelling of the left second and CP joint. No known  injury. Evaluate for osteomyelitis versus septic joint.  EXAM: LEFT HAND - COMPLETE 3+ VIEW  COMPARISON:  None.  FINDINGS: No fracture or dislocation with special attention paid to the second digit. Joint spaces are preserved. No erosions.  There is mild apparent soft tissue swelling about the second MCP joint, best appreciated on lateral radiograph. This finding is without associated osteolysis, radiopaque foreign body or subcutaneous emphysema.  Scattered extensive vascular calcifications about the imaged distal forearm and hand.  IMPRESSION: Soft tissue swelling about the second MCP joint without associated fracture radiographic evidence of osteomyelitis.   Electronically Signed   By: Sandi Mariscal M.D.   On: 10/12/2014 09:52     Today   Subjective:   Richard Bradley today has no headache,no chest abdominal pain,no new weakness tingling or numbness, feels much better wants to go home today. Objective:   Blood pressure 116/78, pulse 25, temperature 98.2 F (36.8 C), temperature source Oral, resp. rate 16, height 6\' 3"  (1.905 m), weight 82 kg (180 lb 12.4 oz), SpO2 86 %.  Intake/Output Summary (Last 24 hours) at 10/14/14 1816 Last data filed at 10/14/14 0944  Gross per 24 hour  Intake    195 ml  Output    600 ml  Net   -405 ml    Exam  Awake Alert, Oriented X 3, No new F.N deficits, Normal affect [place].AT,PERRAL Supple Neck,No JVD, No cervical lymphadenopathy appriciated.  Symmetrical Chest wall movement, Good air movement bilaterally, CTAB RRR,No Gallops,Rubs or new Murmurs, No Parasternal Heave +ve B.Sounds, Abd Soft, No tenderness, No organomegaly appriciated, No rebound - guarding or rigidity. No Cyanosis, Clubbing or edema, No new Rash or bruise , right hand in bandage and Ace wrap Data Review   Cultures -  Results for orders placed or performed during the hospital encounter of 10/12/14  Culture, blood (routine x 2)     Status: None (Preliminary result)   Collection Time: 10/12/14  10:15 AM  Result Value Ref Range Status   Specimen Description BLOOD RIGHT HAND  Final   Special Requests BOTTLES DRAWN AEROBIC AND ANAEROBIC 5CC  Final   Culture   Final           BLOOD CULTURE RECEIVED NO GROWTH TO DATE CULTURE WILL BE HELD FOR 5 DAYS BEFORE ISSUING A FINAL NEGATIVE REPORT Performed at [facility]    Report Status PENDING  Incomplete  Culture, blood (routine x 2)     Status: None (Preliminary result)   Collection Time: 10/12/14 10:20 AM  Result Value Ref Range Status   Specimen Description BLOOD LEFT HAND  Final   Special Requests BOTTLES DRAWN AEROBIC AND ANAEROBIC 5CC  Final   Culture   Final           BLOOD CULTURE RECEIVED NO GROWTH TO DATE CULTURE WILL BE HELD FOR 5 DAYS BEFORE ISSUING A FINAL NEGATIVE REPORT Performed at [facility]    Report Status PENDING  Incomplete  Surgical PCR screen     Status: Abnormal   Collection Time:  10/12/14  3:55 PM  Result Value Ref Range Status   MRSA, PCR NEGATIVE NEGATIVE Final   Staphylococcus aureus POSITIVE (A) NEGATIVE Final    Comment:        The Xpert SA Assay (FDA approved for NASAL specimens in patients over 36 years of age), is one component of a comprehensive surveillance program.  Test performance has been validated by [facility] for patients greater than or equal to 8 year old. It is not intended to diagnose infection nor to guide or monitor treatment. RESULT CALLED TO, READ BACK BY AND VERIFIED WITH:  TO RHAHUMMOND(RN) BY TCLEVELAND 10/12/2014 AT 9:31PM   Anaerobic culture     Status: None (Preliminary result)   Collection Time: 10/12/14  8:50 PM  Result Value Ref Range Status   Specimen Description ABSCESS LEFT FINGER  Final   Special Requests PT ON VANCOMYCIN  Final   Gram Stain   Final    RARE WBC PRESENT, PREDOMINANTLY PMN NO SQUAMOUS EPITHELIAL CELLS SEEN RARE GRAM POSITIVE COCCI IN PAIRS Performed at [facility]    Culture   Final    NO ANAEROBES ISOLATED;  CULTURE IN PROGRESS FOR 5 DAYS Performed at [facility]    Report Status PENDING  Incomplete  Fungus Culture with Smear     Status: None (Preliminary result)   Collection Time: 10/12/14  8:50 PM  Result Value Ref Range Status   Specimen Description ABSCESS LEFT FINGER  Final   Special Requests PT ON VANCOMYCIN  Final   Fungal Smear   Final    NO YEAST OR FUNGAL ELEMENTS SEEN Performed at [facility]    Culture   Final    CULTURE IN PROGRESS FOR FOUR WEEKS Performed at [facility]    Report Status PENDING  Incomplete  AFB culture with smear     Status: None (Preliminary result)   Collection Time: 10/12/14  8:50 PM  Result Value Ref Range Status   Specimen Description ABSCESS LEFT FINGER  Final   Special Requests PT ON VANCOMYCIN,SPECIMEN A  Final   Acid Fast Smear   Final    NO ACID FAST BACILLI SEEN Performed at [facility]    Culture   Final    CULTURE WILL BE EXAMINED FOR 6 WEEKS BEFORE ISSUING A FINAL REPORT Performed at [facility]    Report Status PENDING  Incomplete  Culture, routine-abscess     Status: None (Preliminary result)   Collection Time: 10/12/14  8:50 PM  Result Value Ref Range Status   Specimen Description ABSCESS LEFT FINGER  Final   Special Requests PT ON VANCOMYCIN,SPECIMEN A  Final   Gram Stain   Final    RARE WBC PRESENT, PREDOMINANTLY PMN NO SQUAMOUS EPITHELIAL CELLS SEEN RARE GRAM POSITIVE COCCI IN PAIRS Performed at [facility]    Culture   Final    FEW STAPHYLOCOCCUS AUREUS Note: RIFAMPIN AND GENTAMICIN SHOULD NOT BE USED AS SINGLE DRUGS FOR TREATMENT OF STAPH INFECTIONS. Performed at [facility]    Report Status PENDING  Incomplete  Anaerobic culture     Status: None (Preliminary result)   Collection Time: 10/12/14  8:52 PM  Result Value Ref Range Status   Specimen Description TISSUE LEFT FINGER  Final   Special Requests PT ON VANCOMYCIN,SPECIMEN B  Final   Gram Stain    Final    RARE WBC PRESENT,BOTH PMN AND MONONUCLEAR NO ORGANISMS SEEN Performed at [facility]  Culture   Final    NO ANAEROBES ISOLATED; CULTURE IN PROGRESS FOR 5 DAYS Performed at [facility]    Report Status PENDING  Incomplete  Fungus Culture with Smear     Status: None (Preliminary result)   Collection Time: 10/12/14  8:52 PM  Result Value Ref Range Status   Specimen Description TISSUE LEFT FINGER  Final   Special Requests PT ON VANCOMYCIN,SPECIMEN B  Final   Fungal Smear   Final    NO YEAST OR FUNGAL ELEMENTS SEEN Performed at [facility]    Culture   Final    CULTURE IN PROGRESS FOR FOUR WEEKS Performed at [facility]    Report Status PENDING  Incomplete  Tissue culture     Status: None (Preliminary result)   Collection Time: 10/12/14  8:52 PM  Result Value Ref Range Status   Specimen Description TISSUE LEFT FINGER  Final   Special Requests PT ON VANCOMYCIN,SPECIMEN B  Final   Gram Stain   Final    RARE WBC PRESENT,BOTH PMN AND MONONUCLEAR NO ORGANISMS SEEN Performed at [facility]    Culture   Final    RARE STAPHYLOCOCCUS AUREUS Note: RIFAMPIN AND GENTAMICIN SHOULD NOT BE USED AS SINGLE DRUGS FOR TREATMENT OF STAPH INFECTIONS. Performed at [facility]    Report Status PENDING  Incomplete  AFB culture with smear     Status: None (Preliminary result)   Collection Time: 10/12/14  8:52 PM  Result Value Ref Range Status   Specimen Description TISSUE LEFT FINGER  Final   Special Requests PT ON VANCOMYCIN,SPECIMEN B  Final   Acid Fast Smear   Final    NO ACID FAST BACILLI SEEN Performed at [facility]    Culture   Final    CULTURE WILL BE EXAMINED FOR 6 WEEKS BEFORE ISSUING A FINAL REPORT Performed at [facility]    Report Status PENDING  Incomplete     CBC w Diff: Lab Results  Component Value Date   WBC 7.2 10/14/2014   HGB 12.1* 10/14/2014   HCT 36.8* 10/14/2014   PLT 148*  10/14/2014   LYMPHOPCT 9* 10/12/2014   MONOPCT 7 10/12/2014   EOSPCT 0 10/12/2014   BASOPCT 0 10/12/2014   CMP: Lab Results  Component Value Date   NA 141 10/14/2014   K 3.6 10/14/2014   CL 107 10/14/2014   CO2 26 10/14/2014   BUN 10 10/14/2014   CREATININE 0.85 10/14/2014   PROT 6.4 03/31/2012   ALBUMIN 3.7 03/31/2012   BILITOT 0.7 03/31/2012   ALKPHOS 49 03/31/2012   AST 24 03/31/2012   ALT 15 03/31/2012  .  Micro Results Recent Results (from the past 240 hour(s))  Culture, blood (routine x 2)     Status: None (Preliminary result)   Collection Time: 10/12/14 10:15 AM  Result Value Ref Range Status   Specimen Description BLOOD RIGHT HAND  Final   Special Requests BOTTLES DRAWN AEROBIC AND ANAEROBIC 5CC  Final   Culture   Final           BLOOD CULTURE RECEIVED NO GROWTH TO DATE CULTURE WILL BE HELD FOR 5 DAYS BEFORE ISSUING A FINAL NEGATIVE REPORT Performed at [facility]    Report Status PENDING  Incomplete  Culture, blood (routine x 2)     Status: None (Preliminary result)   Collection Time: 10/12/14 10:20 AM  Result Value Ref Range Status   Specimen Description  BLOOD LEFT HAND  Final   Special Requests BOTTLES DRAWN AEROBIC AND ANAEROBIC 5CC  Final   Culture   Final           BLOOD CULTURE RECEIVED NO GROWTH TO DATE CULTURE WILL BE HELD FOR 5 DAYS BEFORE ISSUING A FINAL NEGATIVE REPORT Performed at [facility]    Report Status PENDING  Incomplete  Surgical PCR screen     Status: Abnormal   Collection Time: 10/12/14  3:55 PM  Result Value Ref Range Status   MRSA, PCR NEGATIVE NEGATIVE Final   Staphylococcus aureus POSITIVE (A) NEGATIVE Final    Comment:        The Xpert SA Assay (FDA approved for NASAL specimens in patients over 33 years of age), is one component of a comprehensive surveillance program.  Test performance has been validated by [facility] for patients greater than or equal to 29 year old. It is not intended to diagnose  infection nor to guide or monitor treatment. RESULT CALLED TO, READ BACK BY AND VERIFIED WITH:  TO RHAHUMMOND(RN) BY TCLEVELAND 10/12/2014 AT 9:31PM   Anaerobic culture     Status: None (Preliminary result)   Collection Time: 10/12/14  8:50 PM  Result Value Ref Range Status   Specimen Description ABSCESS LEFT FINGER  Final   Special Requests PT ON VANCOMYCIN  Final   Gram Stain   Final    RARE WBC PRESENT, PREDOMINANTLY PMN NO SQUAMOUS EPITHELIAL CELLS SEEN RARE GRAM POSITIVE COCCI IN PAIRS Performed at [facility]    Culture   Final    NO ANAEROBES ISOLATED; CULTURE IN PROGRESS FOR 5 DAYS Performed at [facility]    Report Status PENDING  Incomplete  Fungus Culture with Smear     Status: None (Preliminary result)   Collection Time: 10/12/14  8:50 PM  Result Value Ref Range Status   Specimen Description ABSCESS LEFT FINGER  Final   Special Requests PT ON VANCOMYCIN  Final   Fungal Smear   Final    NO YEAST OR FUNGAL ELEMENTS SEEN Performed at [facility]    Culture   Final    CULTURE IN PROGRESS FOR FOUR WEEKS Performed at [facility]    Report Status PENDING  Incomplete  AFB culture with smear     Status: None (Preliminary result)   Collection Time: 10/12/14  8:50 PM  Result Value Ref Range Status   Specimen Description ABSCESS LEFT FINGER  Final   Special Requests PT ON VANCOMYCIN,SPECIMEN A  Final   Acid Fast Smear   Final    NO ACID FAST BACILLI SEEN Performed at [facility]    Culture   Final    CULTURE WILL BE EXAMINED FOR 6 WEEKS BEFORE ISSUING A FINAL REPORT Performed at [facility]    Report Status PENDING  Incomplete  Culture, routine-abscess     Status: None (Preliminary result)   Collection Time: 10/12/14  8:50 PM  Result Value Ref Range Status   Specimen Description ABSCESS LEFT FINGER  Final   Special Requests PT ON VANCOMYCIN,SPECIMEN A  Final   Gram Stain   Final    RARE WBC PRESENT,  PREDOMINANTLY PMN NO SQUAMOUS EPITHELIAL CELLS SEEN RARE GRAM POSITIVE COCCI IN PAIRS Performed at [facility]    Culture   Final    FEW STAPHYLOCOCCUS AUREUS Note: RIFAMPIN AND GENTAMICIN SHOULD NOT BE USED AS SINGLE DRUGS FOR TREATMENT OF STAPH INFECTIONS. Performed  at [facility]    Report Status PENDING  Incomplete  Anaerobic culture     Status: None (Preliminary result)   Collection Time: 10/12/14  8:52 PM  Result Value Ref Range Status   Specimen Description TISSUE LEFT FINGER  Final   Special Requests PT ON VANCOMYCIN,SPECIMEN B  Final   Gram Stain   Final    RARE WBC PRESENT,BOTH PMN AND MONONUCLEAR NO ORGANISMS SEEN Performed at [facility]    Culture   Final    NO ANAEROBES ISOLATED; CULTURE IN PROGRESS FOR 5 DAYS Performed at [facility]    Report Status PENDING  Incomplete  Fungus Culture with Smear     Status: None (Preliminary result)   Collection Time: 10/12/14  8:52 PM  Result Value Ref Range Status   Specimen Description TISSUE LEFT FINGER  Final   Special Requests PT ON VANCOMYCIN,SPECIMEN B  Final   Fungal Smear   Final    NO YEAST OR FUNGAL ELEMENTS SEEN Performed at [facility]    Culture   Final    CULTURE IN PROGRESS FOR FOUR WEEKS Performed at [facility]    Report Status PENDING  Incomplete  Tissue culture     Status: None (Preliminary result)   Collection Time: 10/12/14  8:52 PM  Result Value Ref Range Status   Specimen Description TISSUE LEFT FINGER  Final   Special Requests PT ON VANCOMYCIN,SPECIMEN B  Final   Gram Stain   Final    RARE WBC PRESENT,BOTH PMN AND MONONUCLEAR NO ORGANISMS SEEN Performed at [facility]    Culture   Final    RARE STAPHYLOCOCCUS AUREUS Note: RIFAMPIN AND GENTAMICIN SHOULD NOT BE USED AS SINGLE DRUGS FOR TREATMENT OF STAPH INFECTIONS. Performed at [facility]    Report Status PENDING  Incomplete  AFB culture with smear     Status: None  (Preliminary result)   Collection Time: 10/12/14  8:52 PM  Result Value Ref Range Status   Specimen Description TISSUE LEFT FINGER  Final   Special Requests PT ON VANCOMYCIN,SPECIMEN B  Final   Acid Fast Smear   Final    NO ACID FAST BACILLI SEEN Performed at [facility]    Culture   Final    CULTURE WILL BE EXAMINED FOR 6 WEEKS BEFORE ISSUING A FINAL REPORT Performed at [facility]    Report Status PENDING  Incomplete     Discharge Instructions      Follow-up Information    Follow up with Linna Hoff, MD. Schedule an appointment as soon as possible for a visit in 4 days.   Specialty:  Orthopedic Surgery   Contact information:   [address] [place] [place] [address] [phone]       Discharge Medications     Medication List    TAKE these medications        clonazePAM 0.5 MG tablet  Commonly known as:  KLONOPIN  1 for sleep taken 1/2 hour before bedtime     doxycycline 100 MG tablet  Commonly known as:  VIBRA-TABS  Take 1 tablet (100 mg total) by mouth 2 (two) times daily.     FIBER PO  Take 3 capsules by mouth daily.     fluticasone 50 MCG/ACT nasal spray  Commonly known as:  FLONASE  Place 2 sprays into both nostrils daily as needed for allergies or rhinitis.     furosemide 20 MG tablet  Commonly known as:  LASIX  Take 20 mg by mouth.     HYDROcodone-acetaminophen 10-325 MG per tablet  Commonly known as:  NORCO  Take 1 tablet by mouth every 6 (six) hours as needed.     metoprolol 50 MG tablet  Commonly known as:  LOPRESSOR  Take 50 mg by mouth 3 (three) times daily.     mometasone 50 MCG/ACT nasal spray  Commonly known as:  NASONEX  Place 2 sprays into the nose daily as needed. For allergies     omeprazole 20 MG capsule  Commonly known as:  PRILOSEC  Take 1 capsule (20 mg total) by mouth daily.     predniSONE 10 MG tablet  Commonly known as:  DELTASONE  3 tablets by mouth daily for total of 30mg       vitamin B-12 1000 MCG tablet  Commonly known as:  CYANOCOBALAMIN  Take 1,000 mcg by mouth daily.     VITAMIN D-3 PO  Take 1 tablet by mouth daily.     warfarin 5 MG tablet  Commonly known as:  COUMADIN  Take 5 mg by mouth daily.         Total Time in preparing paper work, data evaluation and todays exam - 35 minutes  ELGERGAWY, DAWOOD M.D on 10/14/2014 at [place]  [phone]

## 2014-10-14 NOTE — Progress Notes (Signed)
Pt and daughter were given discharge teaching. Pt's iv was removed. Pt in no apparent distress at this time. Pt to be wheeled down via wheelchair by nurse tech.  Telebox was removed and cleaned. Prescriptions were given to family.

## 2014-10-15 LAB — TISSUE CULTURE

## 2014-10-15 LAB — CULTURE, ROUTINE-ABSCESS

## 2014-10-17 ENCOUNTER — Encounter: Payer: Self-pay | Admitting: Internal Medicine

## 2014-10-17 ENCOUNTER — Ambulatory Visit (INDEPENDENT_AMBULATORY_CARE_PROVIDER_SITE_OTHER): Payer: Medicare Other | Admitting: Internal Medicine

## 2014-10-17 VITALS — BP 118/66 | HR 91 | Ht 70.0 in | Wt 182.8 lb

## 2014-10-17 DIAGNOSIS — R0600 Dyspnea, unspecified: Secondary | ICD-10-CM

## 2014-10-17 LAB — ANAEROBIC CULTURE

## 2014-10-17 MED ORDER — ZALEPLON 5 MG PO CAPS: ORAL_CAPSULE | ORAL | Status: DC

## 2014-10-17 NOTE — Patient Instructions (Addendum)
Script to try Sonata/ zaleplon, 1 or 2 caps at bedtime for sleep, if needed. Try this instead of the clonazepam and see if you have less "hangover"  Order- [facility]   ONOX on room air   Dx  Dyspnea

## 2014-10-17 NOTE — Progress Notes (Signed)
08/12/14- 81 yo M never smoker  Pt referred by Dr. Osvaldo [facility], who follows for Myasthenia,  for restlessness and unable to fall back asleep during the night. Pt wears nocturnal O2 as need, 2lpm/ Advanced. EPWORTH- 5 Daughter Richard Bradley is here Saw Dr Richard Bradley here previously  for -, hx of A fib, myasthenia gravis (dx 11/12) followed by Dr Richard Bradley and on Cellsept, prednisone, mestinon. CT scan of the chest with some bibasilar scar vs plaquing - he states that he has had these followed for over 50 years. He was referred to evaluate SOB. I had seen Richard Bradley here several years ago for dyspnea which responded nicely to Lasix. Since around the time of diagnosis of myasthenia 3 years ago, he complains that he falls asleep easily but then wakes several times during the night and can't go back to sleep for an hour at a time. He sits up to read or watch TV. Family describes restless activity, talking, reaching, moving his legs such that he scratches himself with toenails. There is no sleepwalking, punching or complex behavior. There is no history of CVA or seizure. During the day he doses off anytime he sits quietly.. They deny significant snoring. His daughter has watched him., He takes hydrocodone once daily for back pain. Klonopin and Ambien were on his medication list but have not been taken in a long time. Family is gradually transitioning his care and home to this area where he can be supported by his daughter.  10/17/14- 6 yo M never smoker  Pt referred by Dr. Osvaldo Bradley [facility]rology, who follows for Myasthenia,  for restlessness and unable to fall back asleep during the night. Pt wears nocturnal O2 as need, 2lpm/ Advanced. EPWORTH- 20 Daughter Richard Bradley is here FOLLOWS FOR:has not worn O2 at night in a while-states he feels fine in the mornings when doing this. Recent hosp for cellulitis LUE    ROS-see HPI Constitutional:   No-   weight loss, night sweats, fevers, chills, fatigue, lassitude. HEENT:   No-   headaches, difficulty swallowing, tooth/dental problems, sore throat,       No-  sneezing, itching, ear ache, nasal congestion, post nasal drip,  CV:  No-   chest pain, orthopnea, PND, swelling in lower extremities, anasarca,                                  dizziness, +palpitations Resp: + shortness of breath with exertion or at rest.              No-   productive cough,  +non-productive cough,  No- coughing up of blood.              No-   change in color of mucus.  No- wheezing.   Skin: No-   rash or lesions. GI:  No-   heartburn, indigestion, abdominal pain, nausea, vomiting, diarrhea,                 change in bowel habits, loss of appetite GU: No-   dysuria, change in color of urine, no urgency or frequency.  No- flank pain. MS:  No-   joint pain or swelling.  No- decreased range of motion.  No- back pain. Neuro-     nothing unusual Psych:  No- change in mood or affect. No depression or anxiety.  No memory loss.  OBJ- Physical Exam General- Alert, Oriented, Affect-appropriate, Distress- none acute, +  overweight Skin- rash-none, lesions- none, excoriation- none Lymphadenopathy- none Head- atraumatic            Eyes- Gross vision intact, PERRLA, conjunctivae and secretions clear            Ears- Hearing, canals-normal            Nose- Clear, no-Septal dev, mucus, polyps, erosion, perforation             Throat- Mallampati II , mucosa clear , drainage- none, tonsils- atrophic Neck- flexible , trachea midline, no stridor , thyroid nl, carotid no bruit Chest - symmetrical excursion , unlabored           Heart/CV- IRR/ no pacemaker , no murmur , no gallop  , no rub, nl s1 s2                           - JVD- none , edema- none, stasis changes- none, varices- none           Lung- clear to P&A, wheeze- none, cough- none , dullness-none, rub- none           Chest wall-  Abd- tender-no, distended-no, bowel sounds-present, HSM- no Br/ Gen/ Rectal- Not done, not indicated Extrem- cyanosis-  none, clubbing, none, atrophy- none, strength- nl,   +contractures fingers L hand Neuro- grossly intact to observation

## 2014-10-18 LAB — CULTURE, BLOOD (ROUTINE X 2)
CULTURE: NO GROWTH
Culture: NO GROWTH

## 2014-10-19 ENCOUNTER — Ambulatory Visit (INDEPENDENT_AMBULATORY_CARE_PROVIDER_SITE_OTHER): Payer: Medicare Other | Admitting: General Practice

## 2014-10-19 DIAGNOSIS — Z5181 Encounter for therapeutic drug level monitoring: Secondary | ICD-10-CM

## 2014-10-19 LAB — POCT INR: INR: 2.7

## 2014-10-19 NOTE — Progress Notes (Signed)
Agree with plan 

## 2014-10-19 NOTE — Progress Notes (Signed)
Pre visit review using our clinic review tool, if applicable. No additional management support is needed unless otherwise documented below in the visit note. 

## 2014-11-07 LAB — FUNGUS CULTURE W SMEAR: Fungal Smear: NONE SEEN

## 2014-11-09 LAB — FUNGUS CULTURE W SMEAR: Fungal Smear: NONE SEEN

## 2014-11-16 ENCOUNTER — Ambulatory Visit: Payer: Medicare Other

## 2014-11-27 LAB — AFB CULTURE WITH SMEAR (NOT AT ARMC)
ACID FAST SMEAR: NONE SEEN
Acid Fast Smear: NONE SEEN

## 2015-01-13 ENCOUNTER — Ambulatory Visit: Payer: Medicare Other | Admitting: Internal Medicine

## 2015-01-20 ENCOUNTER — Ambulatory Visit: Payer: Medicare Other | Admitting: Internal Medicine

## 2015-02-16 ENCOUNTER — Ambulatory Visit: Payer: Medicare Other | Admitting: Internal Medicine

## 2015-02-17 ENCOUNTER — Ambulatory Visit: Payer: Medicare Other | Admitting: Internal Medicine

## 2015-04-20 ENCOUNTER — Ambulatory Visit: Payer: Self-pay | Admitting: General Practice

## 2015-04-20 DIAGNOSIS — Z5181 Encounter for therapeutic drug level monitoring: Secondary | ICD-10-CM

## 2015-07-20 ENCOUNTER — Ambulatory Visit (INDEPENDENT_AMBULATORY_CARE_PROVIDER_SITE_OTHER): Payer: Medicare Other

## 2015-07-20 ENCOUNTER — Ambulatory Visit (INDEPENDENT_AMBULATORY_CARE_PROVIDER_SITE_OTHER): Payer: Medicare Other | Admitting: Family Medicine

## 2015-07-20 VITALS — BP 108/69 | HR 92 | Temp 98.2°F | Resp 17 | Ht 72.0 in | Wt 182.2 lb

## 2015-07-20 DIAGNOSIS — J309 Allergic rhinitis, unspecified: Secondary | ICD-10-CM

## 2015-07-20 DIAGNOSIS — R079 Chest pain, unspecified: Secondary | ICD-10-CM | POA: Diagnosis not present

## 2015-07-20 MED ORDER — FUROSEMIDE 20 MG PO TABS: 20.0000 mg | ORAL_TABLET | Freq: Two times a day (BID) | ORAL | Status: DC

## 2015-07-20 MED ORDER — FLUTICASONE PROPIONATE 50 MCG/ACT NA SUSP: 2.0000 | Freq: Every day | NASAL | Status: DC | PRN

## 2015-07-20 NOTE — Progress Notes (Signed)
81 yo man with intermittent chest pain over the last two days. The first episode was the night before last a 4 am and he put on oxygen and the pain subsided in an hour.  It was left sternal and radiated straight back.    The pain returned this morning at 4 am again this morning and he took a hydrocodone.  The pain has been positional, worse when supine.  The pain has disappeared now.  No nausea, shortness of breath, diaphoresis.  He has prostate cancer and a rheumatological disorder.  He is visiting his daughter from [place], [place].  No dysphagia.  Objective:  NAD, alert and appropriate HEENT:  No significant abnormality Chest:  Clear Heart:  Irreg Irreg, 1/6 systolic murmur Abdomen:  No bruit, soft, active BS, nontender Ext:  2+ edema  EKG: Atrial fibrillation with no acute changes  [facility] reading (PRIMARY) by  Dr. Joseph Art:  CXR:. Cardiomegaly, chronic scarring, old cement placement in T11  Assessment: Fluid overload which is causing supine chest pain.  Plan: Patient is to call me if he has further problems, otherwise take Lasix twice a day to get the fluid off. He will follow-up with his primary care doctor once he is back home.  Signed, Carola Frost.D.

## 2015-07-20 NOTE — Patient Instructions (Signed)
You have extra fluid in your lungs which I think is causing the chest pain. I will you to take Lasix twice a day for the next 3 days to get some of that extra fluid off. If you have further chest pain, please call me or return or go to the emergency room.

## 2016-03-18 ENCOUNTER — Other Ambulatory Visit (HOSPITAL_COMMUNITY): Payer: Self-pay | Admitting: Family

## 2016-03-19 ENCOUNTER — Encounter (HOSPITAL_COMMUNITY): Payer: Self-pay | Admitting: *Deleted

## 2016-03-19 NOTE — Progress Notes (Addendum)
I spoke with Mr Fama, who gave me permission to speak with daughter, Bethena Roys his POA.  Patient will bring medication list to the hospital with him.  Patient was instructed to continue Coumadin, by Dr Sharol Given.  Stop Bang Assessmnet score '5'.  Sleep study has bee recommended for patient, patient refuses.   Patient's cardiologist is Dr Marzella Schlein with [facility]; patient sees neurologist in same group.

## 2016-03-20 ENCOUNTER — Ambulatory Visit (HOSPITAL_COMMUNITY)
Admission: RE | Admit: 2016-03-20 | Discharge: 2016-03-20 | Disposition: A | Discharge status: Home / Self Care | Payer: Medicare Other | Source: Ambulatory Visit | Attending: Orthopedic Surgery | Admitting: Orthopedic Surgery

## 2016-03-20 ENCOUNTER — Ambulatory Visit (HOSPITAL_COMMUNITY): Payer: Medicare Other | Admitting: Anesthesiology

## 2016-03-20 ENCOUNTER — Encounter (HOSPITAL_COMMUNITY): Payer: Self-pay | Admitting: *Deleted

## 2016-03-20 ENCOUNTER — Encounter (HOSPITAL_COMMUNITY)
Admission: RE | Disposition: A | Discharge status: Home / Self Care | Payer: Self-pay | Source: Ambulatory Visit | Attending: Orthopedic Surgery

## 2016-03-20 DIAGNOSIS — Z79899 Other long term (current) drug therapy: Secondary | ICD-10-CM | POA: Diagnosis not present

## 2016-03-20 DIAGNOSIS — Z96651 Presence of right artificial knee joint: Secondary | ICD-10-CM | POA: Diagnosis not present

## 2016-03-20 DIAGNOSIS — M86172 Other acute osteomyelitis, left ankle and foot: Secondary | ICD-10-CM

## 2016-03-20 DIAGNOSIS — L97529 Non-pressure chronic ulcer of other part of left foot with unspecified severity: Secondary | ICD-10-CM | POA: Diagnosis not present

## 2016-03-20 DIAGNOSIS — M86672 Other chronic osteomyelitis, left ankle and foot: Secondary | ICD-10-CM | POA: Insufficient documentation

## 2016-03-20 DIAGNOSIS — Z8546 Personal history of malignant neoplasm of prostate: Secondary | ICD-10-CM | POA: Insufficient documentation

## 2016-03-20 DIAGNOSIS — I4891 Unspecified atrial fibrillation: Secondary | ICD-10-CM | POA: Diagnosis not present

## 2016-03-20 DIAGNOSIS — Z7952 Long term (current) use of systemic steroids: Secondary | ICD-10-CM | POA: Insufficient documentation

## 2016-03-20 DIAGNOSIS — Z7901 Long term (current) use of anticoagulants: Secondary | ICD-10-CM | POA: Insufficient documentation

## 2016-03-20 DIAGNOSIS — G7000 Myasthenia gravis without (acute) exacerbation: Secondary | ICD-10-CM | POA: Insufficient documentation

## 2016-03-20 DIAGNOSIS — M869 Osteomyelitis, unspecified: Secondary | ICD-10-CM | POA: Diagnosis present

## 2016-03-20 HISTORY — DX: Gastro-esophageal reflux disease without esophagitis: K21.9

## 2016-03-20 HISTORY — DX: Asymptomatic varicose veins of left lower extremity: I83.92

## 2016-03-20 HISTORY — DX: Personal history of urinary calculi: Z87.442

## 2016-03-20 HISTORY — PX: AMPUTATION: SHX166

## 2016-03-20 LAB — BASIC METABOLIC PANEL
Anion gap: 7 (ref 5–15)
BUN: 17 mg/dL (ref 6–20)
CALCIUM: 8.6 mg/dL — AB (ref 8.9–10.3)
CHLORIDE: 107 mmol/L (ref 101–111)
CO2: 25 mmol/L (ref 22–32)
CREATININE: 0.93 mg/dL (ref 0.61–1.24)
GFR calc non Af Amer: >60 mL/min (ref >60)
Glucose, Bld: 100 mg/dL — ABNORMAL HIGH (ref 65–99)
Potassium: 3.8 mmol/L (ref 3.5–5.1)
SODIUM: 139 mmol/L (ref 135–145)

## 2016-03-20 LAB — CBC
HCT: 44.6 % (ref 39.0–52.0)
Hemoglobin: 14.1 g/dL (ref 13.0–17.0)
MCH: 32.6 pg (ref 26.0–34.0)
MCHC: 31.6 g/dL (ref 30.0–36.0)
MCV: 103.2 fL — AB (ref 78.0–100.0)
Platelets: 167 10*3/uL (ref 150–400)
RBC: 4.32 MIL/uL (ref 4.22–5.81)
RDW: 14.4 % (ref 11.5–15.5)
WBC: 11.3 10*3/uL — AB (ref 4.0–10.5)

## 2016-03-20 LAB — PROTIME-INR
INR: 2.06
Prothrombin Time: 23.6 seconds — ABNORMAL HIGH (ref 11.4–15.2)

## 2016-03-20 SURGERY — AMPUTATION DIGIT
Anesthesia: Regional | Laterality: Left

## 2016-03-20 MED ORDER — FENTANYL CITRATE (PF) 100 MCG/2ML IJ SOLN
INTRAMUSCULAR | Status: AC
  Filled 2016-03-20: qty 2

## 2016-03-20 MED ORDER — FENTANYL CITRATE (PF) 100 MCG/2ML IJ SOLN: 25.0000 ug | INTRAMUSCULAR | Status: DC | PRN

## 2016-03-20 MED ORDER — PROPOFOL 10 MG/ML IV BOLUS
INTRAVENOUS | Status: AC
  Filled 2016-03-20: qty 20

## 2016-03-20 MED ORDER — 0.9 % SODIUM CHLORIDE (POUR BTL) OPTIME
TOPICAL | Status: DC | PRN
  Administered 2016-03-20: 1000 mL

## 2016-03-20 MED ORDER — LACTATED RINGERS IV SOLN: INTRAVENOUS | Status: DC

## 2016-03-20 MED ORDER — FENTANYL CITRATE (PF) 100 MCG/2ML IJ SOLN
INTRAMUSCULAR | Status: AC
  Administered 2016-03-20: 25 ug
  Filled 2016-03-20: qty 2

## 2016-03-20 MED ORDER — MEPERIDINE HCL 25 MG/ML IJ SOLN: 6.2500 mg | INTRAMUSCULAR | Status: DC | PRN

## 2016-03-20 MED ORDER — BUPIVACAINE-EPINEPHRINE (PF) 0.5% -1:200000 IJ SOLN
INTRAMUSCULAR | Status: DC | PRN
  Administered 2016-03-20: 30 mL via PERINEURAL

## 2016-03-20 MED ORDER — CHLORHEXIDINE GLUCONATE 4 % EX LIQD: 60.0000 mL | Freq: Once | CUTANEOUS | Status: DC

## 2016-03-20 MED ORDER — LACTATED RINGERS IV SOLN
INTRAVENOUS | Status: DC
  Administered 2016-03-20 (×2): via INTRAVENOUS

## 2016-03-20 MED ORDER — PROMETHAZINE HCL 25 MG/ML IJ SOLN: 6.2500 mg | INTRAMUSCULAR | Status: DC | PRN

## 2016-03-20 MED ORDER — CEFAZOLIN SODIUM-DEXTROSE 2-4 GM/100ML-% IV SOLN
2.0000 g | INTRAVENOUS | Status: AC
  Administered 2016-03-20: 2 g via INTRAVENOUS
  Filled 2016-03-20: qty 100

## 2016-03-20 SURGICAL SUPPLY — 37 items
BLADE SURG 21 STRL SS (BLADE) ×3 IMPLANT
BNDG COHESIVE 4X5 TAN STRL (GAUZE/BANDAGES/DRESSINGS) ×3 IMPLANT
BNDG ESMARK 4X9 LF (GAUZE/BANDAGES/DRESSINGS) IMPLANT
BNDG GAUZE ELAST 4 BULKY (GAUZE/BANDAGES/DRESSINGS) ×3 IMPLANT
COVER SURGICAL LIGHT HANDLE (MISCELLANEOUS) ×6 IMPLANT
DRAPE U-SHAPE 47X51 STRL (DRAPES) ×3 IMPLANT
DRSG ADAPTIC 3X8 NADH LF (GAUZE/BANDAGES/DRESSINGS) ×6 IMPLANT
DRSG PAD ABDOMINAL 8X10 ST (GAUZE/BANDAGES/DRESSINGS) ×3 IMPLANT
DURAPREP 26ML APPLICATOR (WOUND CARE) ×3 IMPLANT
ELECT REM PT RETURN 9FT ADLT (ELECTROSURGICAL) ×3
ELECTRODE REM PT RTRN 9FT ADLT (ELECTROSURGICAL) ×1 IMPLANT
GAUZE SPONGE 4X4 12PLY STRL (GAUZE/BANDAGES/DRESSINGS) ×6 IMPLANT
GLOVE BIO SURGEON STRL SZ 6.5 (GLOVE) ×2 IMPLANT
GLOVE BIO SURGEONS STRL SZ 6.5 (GLOVE) ×1
GLOVE BIOGEL PI IND STRL 6 (GLOVE) ×1 IMPLANT
GLOVE BIOGEL PI IND STRL 7.5 (GLOVE) ×1 IMPLANT
GLOVE BIOGEL PI IND STRL 9 (GLOVE) ×1 IMPLANT
GLOVE BIOGEL PI INDICATOR 6 (GLOVE) ×2
GLOVE BIOGEL PI INDICATOR 7.5 (GLOVE) ×2
GLOVE BIOGEL PI INDICATOR 9 (GLOVE) ×2
GLOVE SURG ORTHO 9.0 STRL STRW (GLOVE) ×3 IMPLANT
GLOVE SURG SS PI 6.0 STRL IVOR (GLOVE) ×3 IMPLANT
GOWN STRL REUS W/ TWL XL LVL3 (GOWN DISPOSABLE) ×2 IMPLANT
GOWN STRL REUS W/TWL XL LVL3 (GOWN DISPOSABLE) ×4
KIT BASIN OR (CUSTOM PROCEDURE TRAY) ×3 IMPLANT
KIT ROOM TURNOVER OR (KITS) ×3 IMPLANT
MANIFOLD NEPTUNE II (INSTRUMENTS) IMPLANT
NEEDLE 22X1 1/2 (OR ONLY) (NEEDLE) IMPLANT
NS IRRIG 1000ML POUR BTL (IV SOLUTION) ×3 IMPLANT
PACK ORTHO EXTREMITY (CUSTOM PROCEDURE TRAY) ×3 IMPLANT
PAD ARMBOARD 7.5X6 YLW CONV (MISCELLANEOUS) ×6 IMPLANT
SUCTION FRAZIER HANDLE 10FR (MISCELLANEOUS)
SUCTION TUBE FRAZIER 10FR DISP (MISCELLANEOUS) IMPLANT
SUT ETHILON 2 0 PSLX (SUTURE) ×3 IMPLANT
SYR CONTROL 10ML LL (SYRINGE) IMPLANT
TOWEL OR 17X24 6PK STRL BLUE (TOWEL DISPOSABLE) ×3 IMPLANT
TOWEL OR 17X26 10 PK STRL BLUE (TOWEL DISPOSABLE) ×3 IMPLANT

## 2016-03-20 NOTE — Anesthesia Preprocedure Evaluation (Addendum)
Anesthesia Evaluation  Patient identified by MRN, date of birth, ID band Patient awake    Reviewed: Allergy & Precautions, NPO status , Patient's Chart, lab work & pertinent test results, reviewed documented beta blocker date and time   Airway Mallampati: II  TM Distance: >3 FB Neck ROM: Full    Dental  (+) Teeth Intact, Dental Advisory Given   Pulmonary neg pulmonary ROS,    breath sounds clear to auscultation       Cardiovascular + Peripheral Vascular Disease and + DOE  + dysrhythmias Atrial Fibrillation  Rhythm:Regular Rate:Normal     Neuro/Psych  Neuromuscular disease negative psych ROS   GI/Hepatic Neg liver ROS, GERD  ,  Endo/Other  negative endocrine ROS  Renal/GU negative Renal ROS  negative genitourinary   Musculoskeletal  (+) Arthritis ,   Abdominal   Peds negative pediatric ROS (+)  Hematology negative hematology ROS (+)   Anesthesia Other Findings   Reproductive/Obstetrics negative OB ROS                            Lab Results  Component Value Date   WBC 11.3 (H) 03/20/2016   HGB 14.1 03/20/2016   HCT 44.6 03/20/2016   MCV 103.2 (H) 03/20/2016   PLT 167 03/20/2016   Lab Results  Component Value Date   CREATININE 0.93 03/20/2016   BUN 17 03/20/2016   NA 139 03/20/2016   K 3.8 03/20/2016   CL 107 03/20/2016   CO2 25 03/20/2016   Lab Results  Component Value Date   INR 2.06 03/20/2016   INR 2.7 10/19/2014   INR 2.27 (H) 10/14/2014   06/2015 EKG: atrial fibrillation.   Anesthesia Physical Anesthesia Plan  ASA: III  Anesthesia Plan: Regional   Post-op Pain Management: GA combined w/ Regional for post-op pain   Induction: Intravenous  Airway Management Planned: Natural Airway  Additional Equipment:   Intra-op Plan:   Post-operative Plan:   Informed Consent: I have reviewed the patients History and Physical, chart, labs and discussed the procedure  including the risks, benefits and alternatives for the proposed anesthesia with the patient or authorized representative who has indicated his/her understanding and acceptance.   Dental advisory given  Plan Discussed with: CRNA  Anesthesia Plan Comments:        Anesthesia Quick Evaluation

## 2016-03-20 NOTE — Anesthesia Postprocedure Evaluation (Signed)
Anesthesia Post Note  Patient: Fabrice Hornback Benett  Procedure(s) Performed: Procedure(s) (LRB): Left Great Toe Amputation at Metatarsophalangeal Joint (Left)  Patient location during evaluation: PACU Anesthesia Type: Regional Level of consciousness: awake and alert Pain management: pain level controlled Vital Signs Assessment: post-procedure vital signs reviewed and stable Respiratory status: spontaneous breathing, nonlabored ventilation, respiratory function stable and patient connected to nasal cannula oxygen Cardiovascular status: stable and blood pressure returned to baseline Anesthetic complications: no    Last Vitals:  Vitals:   03/20/16 1100 03/20/16 1108  BP: (!) 141/95 (!) 129/92  Pulse:  (!) 104  Resp: (!) 23 (!) 27  Temp: 36.4 C     Last Pain:  Vitals:   03/20/16 1108  TempSrc:   PainSc: 0-No pain                 Effie Berkshire

## 2016-03-20 NOTE — H&P (Signed)
Richard Bradley is an 82 y.o. male.   Chief Complaint: Painful left great toe HPI: Patient is an 82 year old gentleman who presents with osteomyelitis ulceration left great toe  Past Medical History:  Diagnosis Date  . Atrial fibrillation ([place])   . Cellulitis    left arm  . Dysrhythmia    hx of atrial fibrilation  . GERD (gastroesophageal reflux disease)    ocassional  . History of kidney stones   . Myasthenia gravis   . Osteoarthritis   . Prostate cancer ([place]) dx'd 1982  . Shortness of breath dyspnea    with exertion  . Urine incontinence   . Varicose veins of left lower extremity     Past Surgical History:  Procedure Laterality Date  . appendectomy    . APPENDECTOMY    . COLONOSCOPY    . HERNIA REPAIR Bilateral    Inguinal  . I&D EXTREMITY Left 10/12/2014   Procedure: IRRIGATION AND DEBRIDEMENT INDEX FINGER;  Surgeon: Iran Planas, MD;  Location: [facility];  Service: Orthopedics;  Laterality: Left;  . KYPHOPLASTY  2012 ?   T 12- L1  . LAMINECTOMY  2005 ?   lumbar- 4-5  . PROSTATE SURGERY    . ROTATOR CUFF REPAIR Right   . TOTAL KNEE ARTHROPLASTY Right     Family History  Problem Relation Age of Onset  . Heart disease Father   . Heart disease Brother   . Heart disease Paternal Grandfather    Social History:  reports that he has never smoked. He has never used smokeless tobacco. He reports that he does not drink alcohol or use drugs.  Allergies: No Known Allergies  No prescriptions prior to admission.    No results found for this or any previous visit (from the past 48 hour(s)). No results found.  Review of Systems  All other systems reviewed and are negative.   There were no vitals taken for this visit. Physical Exam  On examination patient has a palpable pulse. He has ulceration with chronic osteomyelitis changes of the tuft of the left great toe. Assessment/Plan Assessment: Ulceration osteomyelitis left great toe.  Plan: We will plan for left great toe  amputation at the MTP joint. Risk and benefits were discussed including risk of the wound not healing and risk for additional surgery higher level amputation. Patient states he understands and wishes to proceed at this time.  Newt Minion, MD 03/20/2016, 6:31 AM

## 2016-03-20 NOTE — Anesthesia Procedure Notes (Signed)
Anesthesia Regional Block:  Popliteal block  Pre-Anesthetic Checklist: ,, timeout performed, Correct Patient, Correct Site, Correct Laterality, Correct Procedure, Correct Position, site marked, Risks and benefits discussed,  Surgical consent,  Pre-op evaluation,  At surgeon's request and post-op pain management  Laterality: Left  Prep: chloraprep       Needles:  Injection technique: Single-shot  Needle Type: Echogenic Needle     Needle Length: 9cm 9 cm Needle Gauge: 21 and 21 G    Additional Needles:  Procedures: ultrasound guided (picture in chart) Popliteal block Narrative:  Start time: 03/20/2016 9:45 AM End time: 03/20/2016 9:50 AM Injection made incrementally with aspirations every 5 mL.  Performed by: Personally  Anesthesiologist: Suella Broad D  Additional Notes: No immediate complications noted. Pt tolerated well.

## 2016-03-20 NOTE — Transfer of Care (Signed)
Immediate Anesthesia Transfer of Care Note  Patient: Richard Bradley  Procedure(s) Performed: Procedure(s): Left Great Toe Amputation at Metatarsophalangeal Joint (Left)  Patient Location: PACU Phase 2  Anesthesia Type:MAC with regional for post op pain  Level of Consciousness: awake, alert  and oriented  Airway & Oxygen Therapy: Patient Spontanous Breathing  Post-op Assessment: Report given to RN  Post vital signs: Reviewed and stable  Last Vitals:  Vitals:   03/20/16 0812 03/20/16 1100  BP: (!) 127/92 (!) 141/95  Pulse: (!) 122   Resp: 18 (!) 23  Temp: 36.4 C 36.4 C    Last Pain:  Vitals:   03/20/16 0812  TempSrc: Oral         Complications: No apparent anesthesia complications

## 2016-03-20 NOTE — Op Note (Signed)
03/20/2016  10:56 AM  PATIENT:  Richard Bradley    PRE-OPERATIVE DIAGNOSIS:  Osteomyelitis Left Great Toe  POST-OPERATIVE DIAGNOSIS:  Same  PROCEDURE:  Left Great Toe Amputation at Metatarsophalangeal Joint  SURGEON:  Newt Minion, MD  PHYSICIAN ASSISTANT:None ANESTHESIA:   General  PREOPERATIVE INDICATIONS:  Richard Bradley is a  82 y.o. male with a diagnosis of Osteomyelitis Left Great Toe who failed conservative measures and elected for surgical management.    The risks benefits and alternatives were discussed with the patient preoperatively including but not limited to the risks of infection, bleeding, nerve injury, cardiopulmonary complications, the need for revision surgery, among others, and the patient was willing to proceed.  OPERATIVE IMPLANTS: None  OPERATIVE FINDINGS: Minimal petechial bleeding  OPERATIVE PROCEDURE: Patient was brought the operating room after undergoing a popliteal block. After adequate anesthesia were obtained patient's left lower extremity was prepped using DuraPrep draped into a sterile field a timeout was called. A fishmouth incision was made just distal to the MTP joint. The great toe was amputated through the MTP joint. Wound was irrigated with normal saline electrocautery was used for hemostasis. 2-0 nylon was used for skin closure. A sterile compressive dressing was applied patient was extubated taken to the PACU in stable condition.

## 2016-03-21 ENCOUNTER — Encounter (HOSPITAL_COMMUNITY): Payer: Self-pay | Admitting: Orthopedic Surgery

## 2016-04-19 ENCOUNTER — Ambulatory Visit (INDEPENDENT_AMBULATORY_CARE_PROVIDER_SITE_OTHER): Payer: Medicare Other | Admitting: Neurology

## 2016-04-19 ENCOUNTER — Encounter: Payer: Self-pay | Admitting: Neurology

## 2016-04-19 VITALS — BP 118/62 | HR 88 | Ht 74.0 in | Wt 179.0 lb

## 2016-04-19 DIAGNOSIS — G7000 Myasthenia gravis without (acute) exacerbation: Secondary | ICD-10-CM | POA: Diagnosis not present

## 2016-04-19 MED ORDER — PREDNISONE 10 MG PO TABS: 20.0000 mg | ORAL_TABLET | Freq: Every day | ORAL | 3 refills | Status: DC

## 2016-04-19 NOTE — Progress Notes (Signed)
Reason for visit: Myasthenia gravis  Referring physician: Dr. [name] is a 82 y.o. male  History of present illness:  [name] is an 82 year old right-handed white male with a history of myasthenia gravis that was initially diagnosed in 2012 when he developed some blurring of vision and slurring of speech, difficulty swallowing. The patient had a negative ACH binding antibody, but had a positive modulating antibody confirming the diagnosis. The patient has been placed on prednisone, he remains on 20 mg daily. In the past, Imuran and CellCept were initiated, but he could not tolerate these drugs. The patient also could not tolerate Mestinon secondary to stomach upset. On at least 2 or 3 occasions in the past he has acquired IVIG, at least 2 of these were associated with underlying sepsis. The patient will develop difficulty with chewing and swallowing during these exacerbations. The patient currently denies any significant issues with double vision, he is chewing and swallowing well. He denies weakness of the extremities. A CT scan of the chest done in 2012 was unremarkable, no evidence of a thymoma. The patient seems to get worse when his prednisone is lowered from 20-15 mg daily. The patient has some slight fatigue, but not severe. He denies any significant breathing problems. He does have a mild gait disorder, he uses a cane for ambulation. He denies issues controlling the bowels or the bladder.  Past Medical History:  Diagnosis Date  . Atrial fibrillation ([place])   . Cellulitis    left arm  . Dysrhythmia    hx of atrial fibrilation  . GERD (gastroesophageal reflux disease)    ocassional  . History of kidney stones   . Myasthenia gravis   . Osteoarthritis   . Prostate cancer ([place]) dx'd 1982  . Shortness of breath dyspnea    with exertion  . Urine incontinence   . Varicose veins of left lower extremity     Past Surgical History:  Procedure Laterality Date  .  AMPUTATION Left 03/20/2016   Procedure: Left Great Toe Amputation at Metatarsophalangeal Joint;  Surgeon: Newt Minion, MD;  Location: [facility];  Service: Orthopedics;  Laterality: Left;  . appendectomy    . APPENDECTOMY    . COLONOSCOPY    . HERNIA REPAIR Bilateral    Inguinal  . I&D EXTREMITY Left 10/12/2014   Procedure: IRRIGATION AND DEBRIDEMENT INDEX FINGER;  Surgeon: Iran Planas, MD;  Location: [facility];  Service: Orthopedics;  Laterality: Left;  . KYPHOPLASTY  2012 ?   T 12- L1  . LAMINECTOMY  2005 ?   lumbar- 4-5  . PROSTATE SURGERY    . ROTATOR CUFF REPAIR Right   . TOTAL KNEE ARTHROPLASTY Right     Family History  Problem Relation Age of Onset  . Heart disease Father   . Heart disease Brother   . Heart disease Paternal Grandfather   . Stroke Mother     Social history:  reports that he has never smoked. He has never used smokeless tobacco. He reports that he drinks about 2.4 - 3.0 oz of alcohol per week . He reports that he does not use drugs.  Medications:  Prior to Admission medications   Medication Sig Start Date End Date Taking? Authorizing Provider  Cholecalciferol (VITAMIN D-3 PO) Take 2,000 Units by mouth daily.    Yes Historical Provider, MD  FIBER PO Take 1 tablet by mouth 3 (three) times daily.    Yes Historical Provider, MD  fluticasone (  FLONASE) 50 MCG/ACT nasal spray Place 2 sprays into both nostrils daily as needed for allergies or rhinitis. Patient taking differently: Place 2 sprays into both nostrils 2 (two) times daily.  07/20/15  Yes Robyn Haber, MD  furosemide (LASIX) 20 MG tablet Take 1 tablet (20 mg total) by mouth 2 (two) times daily. Patient taking differently: Take 20 mg by mouth daily.  07/20/15  Yes Robyn Haber, MD  HYDROcodone-acetaminophen (NORCO) 10-325 MG per tablet Take 1 tablet by mouth every 5 (five) hours as needed for moderate pain.    Yes Historical Provider, MD  metoprolol tartrate (LOPRESSOR) 25 MG tablet Take 25 mg by mouth 2  (two) times daily.    Yes Historical Provider, MD  predniSONE (DELTASONE) 10 MG tablet Take 20 mg by mouth daily.    Yes Historical Provider, MD  vitamin B-12 (CYANOCOBALAMIN) 1000 MCG tablet Take 1,000 mcg by mouth daily.   Yes Historical Provider, MD  warfarin (COUMADIN) 5 MG tablet Take 2.5-5 mg by mouth daily. M,W, F trake 1 whole tab. All other days 1/2 tab   Yes Historical Provider, MD  doxycycline (VIBRAMYCIN) 100 MG capsule  04/15/16   Historical Provider, MD     No Known Allergies  ROS:  Out of a complete 14 system review of symptoms, the patient complains only of the following symptoms, and all other reviewed systems are negative.  Swelling in the legs Moles Blurred vision Easy bruising, easy bleeding Joint pain Not enough sleep Insomnia, snoring  Blood pressure 118/62, pulse 88, height 6\' 2"  (1.88 m), weight 179 lb (81.2 kg).  Physical Exam  General: The patient is alert and cooperative at the time of the examination.  Eyes: Pupils are equal, round, and reactive to light. Discs are flat bilaterally.  Neck: The neck is supple, no carotid bruits are noted.  Respiratory: The respiratory examination is clear.  Cardiovascular: The cardiovascular examination reveals a regular rate and rhythm, no obvious murmurs or rubs are noted.  Skin: Extremities are with 3+ edema below the knees bilaterally. Significant degenerative arthritis is seen in both hands.  Neurologic Exam  Mental status: The patient is alert and oriented x 3 at the time of the examination. The patient has apparent normal recent and remote memory, with an apparently normal attention span and concentration ability.  Cranial nerves: Facial symmetry is present. There is good sensation of the face to pinprick and soft touch bilaterally. The strength of the facial muscles and the muscles to head turning and shoulder shrug are normal bilaterally. Speech is well enunciated, no aphasia or dysarthria is noted.  Extraocular movements are full. Visual fields are full. The tongue is midline, and the patient has symmetric elevation of the soft palate. No obvious hearing deficits are noted. With superior deviation of the eyes for 1 minute, no increased ptosis or divergence of gaze, or subjective double vision was noted.  Motor: The motor testing reveals 5 over 5 strength of all 4 extremities. Good symmetric motor tone is noted throughout. With the arms elevated for 1 minute, no fatigable weakness of the deltoid muscles was seen.  Sensory: Sensory testing is intact to pinprick, soft touch, vibration sensation, and position sense on the upper extremities. With the lower extremities, there appears to be some decreased pinprick sensation on the left leg relative to the right. Vibration sensation is significant impaired in both feet, position sensation is impaired in the left foot, preserved on the right. No evidence of extinction is noted.  Coordination:  Cerebellar testing reveals good finger-nose-finger and heel-to-shin bilaterally.  Gait and station: Gait is wide-based, slightly unsteady. The patient uses a cane for ambulation. Tandem gait was not attempted. Romberg is negative, slightly unsteady.  Reflexes: Deep tendon reflexes are symmetric, but are depressed bilaterally. Toes are downgoing bilaterally.   Assessment/Plan:  1. Myasthenia gravis  The patient currently is being treated with prednisone alone for they myasthenia, he seems to be doing well at the 20 mg dose. In the future, we may try to reduce the dose to 17.5 mg daily and possibly get him down to 15 mg daily and have him function well. He will follow-up in 6 months, a prescription for the prednisone was given. The patient has recently moved from the [place], [place] area.  Jill Alexanders MD 04/19/2016 8:39 AM  [facility] [address] [place], [place] [address]  Phone [phone] Fax  [phone]

## 2016-04-19 NOTE — Patient Instructions (Addendum)
Myasthenia Gravis Myasthenia gravis (MG) means severe weakness. It is a long-term (chronic) condition that causes weakness in the muscles you can control (voluntary muscles). MG can affect any voluntary muscle. The muscles most often affected are the ones that control:   Eye movement.  Facial movements.  Swallowing. MG is an autoimmune disease, which means that your body's defense system (immune system) attacks healthy parts of your body instead of germs and other things that make you sick. When you have MG, your immune system makes proteins (antibodies) that block the chemical (acetylcholine) your body needs to send nerve signals to your muscles. This causes muscle weakness. CAUSES  The exact cause of MG is unknown. One possible cause is an enlarged thymus gland, which is located under your breastbone.  SIGNS AND SYMPTOMS The earliest symptom of MG is muscle weakness that gets worse with activity and gets better after rest. Other symptoms of MG may include:  Drooping eyelids.  Double vision.  Loss of facial expression.  Trouble chewing and swallowing.  Slurred speech.  A waddling walk.  Weakness of the arms, hands, and legs. Trouble breathing is the most dangerous symptom of MG. Sudden and severe difficulty breathing (myasthenic crisis) may require emergency breathing support. This symptom sometimes happens after:   Infection.  Fever.  Drug reaction. DIAGNOSIS  It can be hard to diagnose MG because muscle weakness is a common symptom in many conditions. Your health care provider will do a physical exam. You may also have tests that will help make a diagnosis. These may include:  A blood test.  A test using the medicine edrophonium. This medicine increases muscle strength by slowing the breakdown of acetylcholine.  Tests to measure nerve conduction to muscle (electromyography).  An imaging study of the chest (CT or MRI). TREATMENT  Treatment can improve muscle strength.  Sometimes symptoms of MG go away for a while (remission) and you can stop treatment. Possible treatments include:  Medicine.  Removal of the thymus gland (thymectomy). This may result in a long remission for some people. HOME CARE INSTRUCTIONS  Take medicines only as directed by your health care provider.  Get plenty of rest to conserve your energy.  Take frequent breaks to rest your eyes.  Maintain a healthy diet and a healthy weight.  Do not use any tobacco products including cigarettes, chewing tobacco, or electronic cigarettes. If you need help quitting, ask your health care provider.  Keep all follow-up visits as directed by your health care provider. This is important. SEEK MEDICAL CARE IF:  Your symptoms get worse after a fever or infection.  You have a reaction to a medicine you are taking.  Your symptoms change or get worse. SEEK IMMEDIATE MEDICAL CARE IF: You have trouble breathing.    This information is not intended to replace advice given to you by your health care provider. Make sure you discuss any questions you have with your health care provider.   Document Released: 10/21/2000 Document Revised: 08/05/2014 Document Reviewed: 09/15/2013 Elsevier Interactive Patient Education 2016 Elsevier Inc.  

## 2016-04-29 ENCOUNTER — Ambulatory Visit (INDEPENDENT_AMBULATORY_CARE_PROVIDER_SITE_OTHER): Payer: Medicare Other | Admitting: Orthopedic Surgery

## 2016-04-29 DIAGNOSIS — L97521 Non-pressure chronic ulcer of other part of left foot limited to breakdown of skin: Secondary | ICD-10-CM | POA: Diagnosis not present

## 2016-04-29 DIAGNOSIS — Z89412 Acquired absence of left great toe: Secondary | ICD-10-CM | POA: Diagnosis not present

## 2016-04-29 DIAGNOSIS — I872 Venous insufficiency (chronic) (peripheral): Secondary | ICD-10-CM

## 2016-04-29 DIAGNOSIS — L02612 Cutaneous abscess of left foot: Secondary | ICD-10-CM | POA: Diagnosis not present

## 2016-05-15 ENCOUNTER — Inpatient Hospital Stay (INDEPENDENT_AMBULATORY_CARE_PROVIDER_SITE_OTHER): Payer: Medicare Other | Admitting: Orthopedic Surgery

## 2016-05-15 ENCOUNTER — Telehealth: Payer: Self-pay | Admitting: Interventional Cardiology

## 2016-05-15 DIAGNOSIS — L03032 Cellulitis of left toe: Secondary | ICD-10-CM

## 2016-05-15 DIAGNOSIS — I872 Venous insufficiency (chronic) (peripheral): Secondary | ICD-10-CM

## 2016-05-15 DIAGNOSIS — Z89412 Acquired absence of left great toe: Secondary | ICD-10-CM

## 2016-05-15 NOTE — Telephone Encounter (Signed)
Pt's dtr calling regarding Coumadin-he lived in [place] and just moved here in August, his dtr has been taking him there to get it checked, it's a long drive and wants to get checks here. He is new and doesn't have an appt with Dr. Tamala Julian until December but he will be establishing.  I asked if he had a PCP and he will but that appt is in December too. pls advise [phone]

## 2016-05-15 NOTE — Telephone Encounter (Signed)
Spoke with Noel Gerold, DPR on file.   Advised of recommendations per [facility].   [name] verbalized understanding and will try to have orders sent to [facility] until pt can be seen by Dr. Tamala Julian.

## 2016-05-15 NOTE — Telephone Encounter (Signed)
Will route to [facility]in Clinic.

## 2016-05-15 NOTE — Telephone Encounter (Signed)
He needs to be evaluated by a cardiologist before we can start managing his Coumadin.   Is it possible for him to have his last 1-2 checks in [place]? If not, would see if they can send in a prescription to [facility] and fax results back to his PCP to manage until pt sees Dr. Tamala Julian (we have done this with traveling patients).

## 2016-05-24 ENCOUNTER — Inpatient Hospital Stay (HOSPITAL_COMMUNITY)
Admission: EM | Admit: 2016-05-24 | Discharge: 2016-05-26 | DRG: 871 | Disposition: A | Discharge status: Home / Self Care | Payer: Medicare Other | Attending: Internal Medicine | Admitting: Internal Medicine

## 2016-05-24 ENCOUNTER — Encounter (HOSPITAL_COMMUNITY): Payer: Self-pay | Admitting: *Deleted

## 2016-05-24 ENCOUNTER — Emergency Department (HOSPITAL_COMMUNITY): Payer: Medicare Other

## 2016-05-24 DIAGNOSIS — I482 Chronic atrial fibrillation, unspecified: Secondary | ICD-10-CM | POA: Diagnosis present

## 2016-05-24 DIAGNOSIS — I340 Nonrheumatic mitral (valve) insufficiency: Secondary | ICD-10-CM | POA: Diagnosis present

## 2016-05-24 DIAGNOSIS — I959 Hypotension, unspecified: Secondary | ICD-10-CM | POA: Diagnosis present

## 2016-05-24 DIAGNOSIS — M1990 Unspecified osteoarthritis, unspecified site: Secondary | ICD-10-CM | POA: Diagnosis present

## 2016-05-24 DIAGNOSIS — Z7951 Long term (current) use of inhaled steroids: Secondary | ICD-10-CM

## 2016-05-24 DIAGNOSIS — I452 Bifascicular block: Secondary | ICD-10-CM | POA: Diagnosis present

## 2016-05-24 DIAGNOSIS — Z823 Family history of stroke: Secondary | ICD-10-CM | POA: Diagnosis not present

## 2016-05-24 DIAGNOSIS — Z8249 Family history of ischemic heart disease and other diseases of the circulatory system: Secondary | ICD-10-CM

## 2016-05-24 DIAGNOSIS — Z66 Do not resuscitate: Secondary | ICD-10-CM | POA: Diagnosis present

## 2016-05-24 DIAGNOSIS — I4891 Unspecified atrial fibrillation: Secondary | ICD-10-CM | POA: Diagnosis present

## 2016-05-24 DIAGNOSIS — Z820 Family history of epilepsy and other diseases of the nervous system: Secondary | ICD-10-CM

## 2016-05-24 DIAGNOSIS — Z96651 Presence of right artificial knee joint: Secondary | ICD-10-CM | POA: Diagnosis present

## 2016-05-24 DIAGNOSIS — D539 Nutritional anemia, unspecified: Secondary | ICD-10-CM | POA: Diagnosis present

## 2016-05-24 DIAGNOSIS — G7000 Myasthenia gravis without (acute) exacerbation: Secondary | ICD-10-CM | POA: Diagnosis present

## 2016-05-24 DIAGNOSIS — Z7952 Long term (current) use of systemic steroids: Secondary | ICD-10-CM | POA: Diagnosis not present

## 2016-05-24 DIAGNOSIS — L97529 Non-pressure chronic ulcer of other part of left foot with unspecified severity: Secondary | ICD-10-CM | POA: Diagnosis present

## 2016-05-24 DIAGNOSIS — Z7901 Long term (current) use of anticoagulants: Secondary | ICD-10-CM

## 2016-05-24 DIAGNOSIS — Z8546 Personal history of malignant neoplasm of prostate: Secondary | ICD-10-CM

## 2016-05-24 DIAGNOSIS — I5042 Chronic combined systolic (congestive) and diastolic (congestive) heart failure: Secondary | ICD-10-CM | POA: Diagnosis present

## 2016-05-24 DIAGNOSIS — K219 Gastro-esophageal reflux disease without esophagitis: Secondary | ICD-10-CM | POA: Diagnosis present

## 2016-05-24 DIAGNOSIS — A419 Sepsis, unspecified organism: Secondary | ICD-10-CM | POA: Diagnosis present

## 2016-05-24 DIAGNOSIS — E876 Hypokalemia: Secondary | ICD-10-CM | POA: Diagnosis present

## 2016-05-24 DIAGNOSIS — J189 Pneumonia, unspecified organism: Secondary | ICD-10-CM | POA: Diagnosis present

## 2016-05-24 DIAGNOSIS — R112 Nausea with vomiting, unspecified: Secondary | ICD-10-CM | POA: Diagnosis not present

## 2016-05-24 DIAGNOSIS — I480 Paroxysmal atrial fibrillation: Secondary | ICD-10-CM | POA: Diagnosis not present

## 2016-05-24 HISTORY — DX: Sepsis, unspecified organism: A41.9

## 2016-05-24 HISTORY — DX: Heart failure, unspecified: I50.9

## 2016-05-24 LAB — URINE MICROSCOPIC-ADD ON: BACTERIA UA: NONE SEEN

## 2016-05-24 LAB — CBC
HCT: 39.6 % (ref 39.0–52.0)
Hemoglobin: 13 g/dL (ref 13.0–17.0)
MCH: 32.9 pg (ref 26.0–34.0)
MCHC: 32.8 g/dL (ref 30.0–36.0)
MCV: 100.3 fL — ABNORMAL HIGH (ref 78.0–100.0)
PLATELETS: 159 10*3/uL (ref 150–400)
RBC: 3.95 MIL/uL — ABNORMAL LOW (ref 4.22–5.81)
RDW: 14.1 % (ref 11.5–15.5)
WBC: 13.3 10*3/uL — AB (ref 4.0–10.5)

## 2016-05-24 LAB — COMPREHENSIVE METABOLIC PANEL
ALK PHOS: 45 U/L (ref 38–126)
ALT: 16 U/L — AB (ref 17–63)
AST: 23 U/L (ref 15–41)
Albumin: 3.3 g/dL — ABNORMAL LOW (ref 3.5–5.0)
Anion gap: 10 (ref 5–15)
BILIRUBIN TOTAL: 2.1 mg/dL — AB (ref 0.3–1.2)
BUN: 12 mg/dL (ref 6–20)
CALCIUM: 8.1 mg/dL — AB (ref 8.9–10.3)
CO2: 25 mmol/L (ref 22–32)
CREATININE: 0.91 mg/dL (ref 0.61–1.24)
Chloride: 104 mmol/L (ref 101–111)
GFR calc Af Amer: >60 mL/min (ref >60)
GLUCOSE: 125 mg/dL — AB (ref 65–99)
Potassium: 2.7 mmol/L — CL (ref 3.5–5.1)
Sodium: 139 mmol/L (ref 135–145)
TOTAL PROTEIN: 5.2 g/dL — AB (ref 6.5–8.1)

## 2016-05-24 LAB — SEDIMENTATION RATE: Sed Rate: 7 mm/hr (ref 0–16)

## 2016-05-24 LAB — BASIC METABOLIC PANEL
Anion gap: 6 (ref 5–15)
BUN: 10 mg/dL (ref 6–20)
CALCIUM: 7.4 mg/dL — AB (ref 8.9–10.3)
CO2: 28 mmol/L (ref 22–32)
Chloride: 107 mmol/L (ref 101–111)
Creatinine, Ser: 0.74 mg/dL (ref 0.61–1.24)
GFR calc Af Amer: >60 mL/min (ref >60)
GLUCOSE: 131 mg/dL — AB (ref 65–99)
POTASSIUM: 3.6 mmol/L (ref 3.5–5.1)
Sodium: 141 mmol/L (ref 135–145)

## 2016-05-24 LAB — I-STAT TROPONIN, ED: Troponin i, poc: 0.01 ng/mL (ref 0.00–0.08)

## 2016-05-24 LAB — PROTIME-INR
INR: 2.19
INR: 3.14
PROTHROMBIN TIME: 24.7 s — AB (ref 11.4–15.2)
Prothrombin Time: 33 seconds — ABNORMAL HIGH (ref 11.4–15.2)

## 2016-05-24 LAB — URINALYSIS, ROUTINE W REFLEX MICROSCOPIC
BILIRUBIN URINE: NEGATIVE
GLUCOSE, UA: NEGATIVE mg/dL
KETONES UR: 15 mg/dL — AB
LEUKOCYTES UA: NEGATIVE
NITRITE: NEGATIVE
PROTEIN: NEGATIVE mg/dL
Specific Gravity, Urine: 1.015 (ref 1.005–1.030)
pH: 7 (ref 5.0–8.0)

## 2016-05-24 LAB — LIPASE, BLOOD: Lipase: 20 U/L (ref 11–51)

## 2016-05-24 LAB — I-STAT CG4 LACTIC ACID, ED: Lactic Acid, Venous: 1.75 mmol/L (ref 0.5–1.9)

## 2016-05-24 LAB — PROCALCITONIN: PROCALCITONIN: 0.12 ng/mL

## 2016-05-24 LAB — C-REACTIVE PROTEIN: CRP: 6.4 mg/dL — ABNORMAL HIGH (ref <1.0)

## 2016-05-24 MED ORDER — DEXTROSE 5 % IV SOLN
500.0000 mg | Freq: Once | INTRAVENOUS | Status: AC
  Administered 2016-05-24: 500 mg via INTRAVENOUS
  Filled 2016-05-24: qty 500

## 2016-05-24 MED ORDER — DEXTROSE 5 % IV SOLN
1.0000 g | INTRAVENOUS | Status: DC
  Administered 2016-05-25 – 2016-05-26 (×2): 1 g via INTRAVENOUS
  Filled 2016-05-24 (×2): qty 10

## 2016-05-24 MED ORDER — WARFARIN SODIUM 5 MG PO TABS: 5.0000 mg | ORAL_TABLET | Freq: Once | ORAL | Status: DC

## 2016-05-24 MED ORDER — ONDANSETRON HCL 4 MG/2ML IJ SOLN
4.0000 mg | Freq: Once | INTRAMUSCULAR | Status: DC | PRN
  Filled 2016-05-24: qty 2

## 2016-05-24 MED ORDER — ONDANSETRON HCL 4 MG/2ML IJ SOLN
4.0000 mg | Freq: Once | INTRAMUSCULAR | Status: AC
  Administered 2016-05-24: 4 mg via INTRAVENOUS

## 2016-05-24 MED ORDER — DEXTROSE 5 % IV SOLN
1.0000 g | Freq: Once | INTRAVENOUS | Status: AC
  Administered 2016-05-24: 1 g via INTRAVENOUS
  Filled 2016-05-24: qty 10

## 2016-05-24 MED ORDER — HYDROCORTISONE NA SUCCINATE PF 100 MG IJ SOLR
100.0000 mg | Freq: Three times a day (TID) | INTRAMUSCULAR | Status: DC
  Administered 2016-05-24 – 2016-05-25 (×3): 100 mg via INTRAVENOUS
  Filled 2016-05-24 (×3): qty 2

## 2016-05-24 MED ORDER — SODIUM CHLORIDE 0.9 % IV SOLN
INTRAVENOUS | Status: DC
  Administered 2016-05-24: 950 mL via INTRAVENOUS

## 2016-05-24 MED ORDER — ACETAMINOPHEN 325 MG PO TABS
650.0000 mg | ORAL_TABLET | Freq: Four times a day (QID) | ORAL | Status: DC | PRN
  Administered 2016-05-25: 650 mg via ORAL
  Filled 2016-05-24: qty 2

## 2016-05-24 MED ORDER — POTASSIUM CHLORIDE CRYS ER 20 MEQ PO TBCR
40.0000 meq | EXTENDED_RELEASE_TABLET | Freq: Once | ORAL | Status: AC
  Administered 2016-05-24: 40 meq via ORAL
  Filled 2016-05-24: qty 2

## 2016-05-24 MED ORDER — WARFARIN - PHARMACIST DOSING INPATIENT
Freq: Every day | Status: DC
  Administered 2016-05-25: 18:00:00

## 2016-05-24 MED ORDER — DEXTROSE 5 % IV SOLN: 500.0000 mg | INTRAVENOUS | Status: DC

## 2016-05-24 MED ORDER — SODIUM CHLORIDE 0.9 % IV BOLUS (SEPSIS)
1000.0000 mL | Freq: Once | INTRAVENOUS | Status: AC
  Administered 2016-05-24: 1000 mL via INTRAVENOUS

## 2016-05-24 MED ORDER — FLUTICASONE PROPIONATE 50 MCG/ACT NA SUSP
2.0000 | Freq: Every day | NASAL | Status: DC | PRN
  Filled 2016-05-24: qty 16

## 2016-05-24 MED ORDER — AZITHROMYCIN 500 MG PO TABS: 500.0000 mg | ORAL_TABLET | Freq: Once | ORAL | Status: DC

## 2016-05-24 MED ORDER — WARFARIN SODIUM 2.5 MG PO TABS
2.5000 mg | ORAL_TABLET | Freq: Once | ORAL | Status: AC
  Administered 2016-05-24: 2.5 mg via ORAL
  Filled 2016-05-24: qty 1

## 2016-05-24 MED ORDER — ACETAMINOPHEN 650 MG RE SUPP: 650.0000 mg | Freq: Four times a day (QID) | RECTAL | Status: DC | PRN

## 2016-05-24 MED ORDER — POTASSIUM CHLORIDE 10 MEQ/100ML IV SOLN
10.0000 meq | INTRAVENOUS | Status: AC
  Administered 2016-05-24 (×2): 10 meq via INTRAVENOUS
  Filled 2016-05-24 (×2): qty 100

## 2016-05-24 NOTE — H&P (Signed)
Date: 05/24/2016               Patient Name:  Richard Bradley MRN: [id number]  DOB: 1926/03/14 Age / Sex: 82 y.o., male   PCP: Maisie Fus, MD              Medical Service: Internal Medicine Teaching Service              Attending Physician: Dr. Lucious Groves, DO    First Contact: Purvis Sheffield, MS 3 Pager: [phone]  Second Contact: Dr. Ledell Noss Pager: [phone]  Third Contact Dr. Maryellen Pile Pager: [phone]       After Hours (After 5p/  First Contact Pager: [phone]  weekends / holidays): Second Contact Pager: [phone]   Chief Complaint: "dry heaving"  History of Present Illness: [name] is an 82 y.o. Male with history of CHF, A-fib, osteomyelitis, and myasthenia gravis who presents with "dry heaving all night." [name] was sleeping on arrival and his speech is somewhat slurred at baseline; history was primarily obtained from his daughter with whom he lives. [name] was well until night of 10/25, when his daughter notes he dry heaved 2-3 times at night before going to bed. He woke up at the usual time on 10/26 and went about his routine, although per his caregiver he ate only ~1/2 his lunch. He ate very little for dinner that night, and this morning told his daughter that he was up all night dry heaving. Per his daughter, he never produced any vomitus and the trash can he was using contained only used tissues. He returned to bed after breakfast, and was very weak and difficult to arouse. His daughter took his temperature several times with two different thermometers, but he was afebrile. [name] occasionally uses O2 at home, usually at night. His pulse ox read 84% when his daughter checked--after supplemental O2 was administered it rose promptly to 96%.  His daughter reports that he has DOE at baseline which has not worsened and is due to fibrotic pulmonary changes ("every time someone sees his chest x-ray they ask him about asbestos"). Of note, the patient had his left  great toe amputated 8/23, finished a course of doxycycline two weeks ago, and is being followed for osteomyelitis of the second toe on his left foot. His daughter says that his left leg is slightly more red than usual. She reports that although his speech is more slurred than baseline, the patient remained coherent and was making sense throughout the morning and walked to the car on the way to the hospital with the aid of his walker.  The patient's daughter reports no changes in Mr. Wish's bowel or bladder habits and denies diarrhea ("he's religious about his fiber"); he had a BM yesterday and has urinated 2-3 times today. She denies sick contacts, new skin breakages, or any complaints of myalgias. He received a pneumonia vaccine ~1 month ago.  Per the patient's daughther, he has had sepsis 2-3 times before, each one caused by a urinary tract infection. The first presentation was the worst and resulted in a myasthenic crisis. Subsequent presentation was not as severe as daughter knew what to look out for. Each previous episode of sepsis was accompanied by "complete lethargy, not making sense," in addition to fever--today's presentation is atypical. Daughter reports that during previous hospitalizations for sepsis, [name] received 2x his normal prednisone course to prevent myasthenic crisis. Today, patient denies dysphagia or eyelid weakness.  In  the emergency department, Mr. Blatt was afebrile (37.9 C rectal), tachycardic (HR 103), tachypneic (RR 25) and normotensive (102/77). Initial O2 sat 63%, which corrected to 95% on 4L by nasal cannula. His CBC was significant for leukocytosis (WBC 13.3). He met 3/4 SIRS criteria and infiltration on chest x-ray was read as possible pneumonia. Code Sepsis was called and patient was fluid resuscitated with 2L NS. Patient was given IV ceftriaxone and azithromycin to cover CAP. CMP showed hypokalemia (2.7), hypocalcemia (8.1), decreased total protein (5.2) and albumin  (3.3), and increased total bilirubin (2.1). Urinalysis significant for small hemoglobin and 78m/dL of ketones, but no sign of infection. Lipase, troponin, and lactic acid WNL. Blood and urine cultures pending. No further dry heaving after receiving Zofran.  Discussed code status with Mr. DStreettand his family. He remains DNR/DNI, but would accept central line with pressor support of BP if necessary.  Meds: Current Facility-Administered Medications  Medication Dose Route Frequency Provider Last Rate Last Dose  . [START ON 05/25/2016] azithromycin (ZITHROMAX) 500 mg in dextrose 5 % 250 mL IVPB  500 mg Intravenous Q24H ELucious Groves DO      . [START ON 05/25/2016] cefTRIAXone (ROCEPHIN) 1 g in dextrose 5 % 50 mL IVPB  1 g Intravenous Q24H ELucious Groves DO      . ondansetron ([facility] injection 4 mg  4 mg Intravenous Once PRN BNat Christen MD       Current Outpatient Prescriptions  Medication Sig Dispense Refill  . Cholecalciferol (VITAMIN D-3 PO) Take 2,000 Units by mouth daily.     .Marland KitchenFIBER PO Take 1 tablet by mouth 4 (four) times daily.     . fluticasone (FLONASE) 50 MCG/ACT nasal spray Place 2 sprays into both nostrils daily as needed for allergies or rhinitis. (Patient taking differently: Place 2 sprays into both nostrils 2 (two) times daily. ) 16 g 6  . furosemide (LASIX) 20 MG tablet Take 1 tablet (20 mg total) by mouth 2 (two) times daily. (Patient taking differently: Take 20 mg by mouth daily. ) 30 tablet 1  . HYDROcodone-acetaminophen (NORCO) 10-325 MG per tablet Take 1 tablet by mouth every 5 (five) hours as needed for moderate pain.     . metoprolol tartrate (LOPRESSOR) 25 MG tablet Take 25 mg by mouth 2 (two) times daily.     . predniSONE (DELTASONE) 10 MG tablet Take 2 tablets (20 mg total) by mouth daily. 180 tablet 3  . vitamin B-12 (CYANOCOBALAMIN) 1000 MCG tablet Take 1,000 mcg by mouth daily.    .Marland Kitchenwarfarin (COUMADIN) 5 MG tablet Take 2.5-5 mg by mouth daily. M,W, F trake 1 whole  tab. All other days 1/2 tab      Allergies: Allergies as of 05/24/2016  . (No Known Allergies)   Past Medical History:  Diagnosis Date  . [place]rial fibrillation ([place]   . Cellulitis    left arm  . CHF (congestive heart failure) ([place]   . Dysrhythmia    hx of atrial fibrilation  . GERD (gastroesophageal reflux disease)    ocassional  . History of kidney stones   . Myasthenia gravis   . Osteoarthritis   . [place]state cancer ([place] dx[place] 1982  . Sepsis ([place]   . Shortness of breath dyspnea    with exertion  . Urine incontinence   . Varicose veins of left lower extremity    Past Surgical History:  Procedure Laterality Date  . AMPUTATION Left 03/20/2016   Procedure: Left Great Toe  Amputation at [place];  Surgeon: Newt Minion, MD;  Location: [facility];  Service: Orthopedics;  Laterality: Left;  . appendectomy    . APPENDECTOMY    . COLONOSCOPY    . HERNIA REPAIR Bilateral    Inguinal  . I&D EXTREMITY Left 10/12/2014   Procedure: IRRIGATION AND DEBRIDEMENT INDEX FINGER;  Surgeon: Iran Planas, MD;  Location: [facility];  Service: Orthopedics;  Laterality: Left;  . KYPHOPLASTY  2012 ?   T 12- L1  . LAMINECTOMY  2005 ?   lumbar- 4-5  . PROSTATE SURGERY    . ROTATOR CUFF REPAIR Right   . TOTAL KNEE ARTHROPLASTY Right    Family History  Problem Relation Age of Onset  . Heart disease Father   . Heart disease Brother   . Heart disease Paternal Grandfather   . Stroke Mother   . Parkinsonism Sister   . Heart disease Sister   . Dementia Sister    Social History   Social History  . Marital status: Married    Spouse name: N/A  . Number of children: 2  . Years of education: 12   Occupational History  . Retired [employer]   Social History Main Topics  . Smoking status: Never Smoker  . Smokeless tobacco: Never Used  . Alcohol use 2.4 - 3.0 oz/week    4 - 5 Cans of beer per week  . Drug use: No  . Sexual activity: Not on file      Comment: Widowed   Other Topics Concern  . Not on file   Social History Narrative   From [place], moving to [place]   Right-handed   Caffeine: 1 cup of coffee per day    Review of Systems: Denies fever, cough, congestion, ear or throat pain, eyelid weakness, dysphagia, dysuria, diarrhea, constipation, myalgias, skin breakages. Endorses fatigue, weakness, nausea, dry heaving without vomitus production. See HPI for further explanation.  Physical Exam: Blood pressure 96/73, pulse 102, temperature 98.6 F (37 C), temperature source Oral, resp. rate 18, height _0  (1.88 m), weight 85.3 kg (188 lb), SpO2 90 %. General Appearance: sleeping initially, ill-appearing, elderly male on nasal cannula; alert and oriented to person, place, and time when awakened Head: Normocephalic, atraumatic Eyes: PERRLA, conjunctivae anicteric Cardiovascular: Tachycardic, irregularly irregular rhythm, no murmurs/rubs/gallops heard on exam Pulmonary: Lungs clear to auscultation bilaterally Abdominal: Soft, non-tender, non-distended MSK: 2+ pitting edema noted on bilateral lower extremities, grossly normal ROM Neuro: Alert and orientated to person, place, and time when awakened. PERRLA, CN III-XII grossly intact Skin: ~104m wound at tip of left toe with blood on bandage (identified by daughter as site of osteomyelitis). 7 mm moist wound above DIP of same toe. Erythema and warmth of LLE extending to calf, RLE cool and non-erythematous.  Lab results: Basic Metabolic Panel:  Recent Labs  05/24/16 1100  NA 139  K 2.7*  CL 104  CO2 25  GLUCOSE 125*  BUN 12  CREATININE 0.91  CALCIUM 8.1*   Liver Function Tests:  Recent Labs  05/24/16 1100  AST 23  ALT 16*  ALKPHOS 45  BILITOT 2.1*  PROT 5.2*  ALBUMIN 3.3*    Recent Labs  05/24/16 1100  LIPASE 20   No results for input(s): AMMONIA in the last 72 hours. CBC:  Recent Labs  05/24/16 1100  WBC 13.3*  HGB 13.0  HCT 39.6    MCV 100.3*  PLT 159  Cardiac Enzymes: No results for input(s): CKTOTAL, CKMB, CKMBINDEX, TROPONINI in the last 72 hours. BNP: No results for input(s): PROBNP in the last 72 hours. D-Dimer: No results for input(s): DDIMER in the last 72 hours. CBG: No results for input(s): GLUCAP in the last 72 hours. Hemoglobin A1C: No results for input(s): HGBA1C in the last 72 hours. Fasting Lipid Panel: No results for input(s): CHOL, HDL, LDLCALC, TRIG, CHOLHDL, LDLDIRECT in the last 72 hours. Thyroid Function Tests: No results for input(s): TSH, T4TOTAL, FREET4, T3FREE, THYROIDAB in the last 72 hours. Anemia Panel: No results for input(s): VITAMINB12, FOLATE, FERRITIN, TIBC, IRON, RETICCTPCT in the last 72 hours. Coagulation:  Recent Labs  05/24/16 1421  LABPROT 24.7*  INR 2.19   Urine Drug Screen: Drugs of Abuse  No results found for: LABOPIA, COCAINSCRNUR, LABBENZ, AMPHETMU, THCU, LABBARB  Alcohol Level: No results for input(s): ETH in the last 72 hours. Urinalysis:  Recent Labs  05/24/16 1115  COLORURINE YELLOW  LABSPEC 1.015  PHURINE 7.0  GLUCOSEU NEGATIVE  HGBUR SMALL*  BILIRUBINUR NEGATIVE  KETONESUR 15*  PROTEINUR NEGATIVE  NITRITE NEGATIVE  LEUKOCYTESUR NEGATIVE   Misc. Labs: I-stat troponin, ED  Order: [id number]  Status:  Final result Visible to patient:  No (Not Released) Next appt:  07/05/2016 at 11:30 AM in Cardiology Sinclair Grooms, MD)   Ref Range & Units 11:17  Troponin i, poc 0.00 - 0.08 ng/mL 0.01   Comment 3       Comments: Due to the release kinetics of cTnI,  a negative result within the first hours  of the onset of symptoms does not rule out  myocardial infarction with certainty.  If myocardial infarction is still suspected,  repeat the test at appropriate intervals.   Resulting Agency  [facility]    Specimen Collected: 05/24/16 11:17 Last Resulted: 05/24/16 11:27         I-Stat CG4 Lactic Acid, ED  Order: [id number]  Status:   Final result Visible to patient:  No (Not Released) Next appt:  07/05/2016 at 11:30 AM in Cardiology Sinclair Grooms, MD)    Ref Range & Units 11:19 (05/24/16) 69yrago (10/12/14) 197yrgo (10/12/14)   Lactic Acid, Venous 0.5 - 1.9 mmol/L 1.75  3.0R, CM   1.3R   Resulting Agency  [facility]    Specimen Collected: 05/24/16 11:19 Last Resulted: 05/24/16 11:20           CM=Additional commentsR=Reference range differs from displayed range         Protime-INR  Order: [id number]:  Final result Visible to patient:  No (Not Released) Next appt:  07/05/2016 at 11:30 AM in Cardiology (HSinclair GroomsMD)    Ref Range & Units 15:22 14:21 [id number] 14yr53yr   Prothrombin Time 11.4 - 15.2 seconds 33.0   24.7   23.6      INR  3.14  2.19  2.06  2.7   Resulting Agency  [facility]     Specimen Collected: 05/24/16 15:22 Last Resulted: 05/24/16 16:10         Procalcitonin - Baseline  Order: [id number]:  Final result Visible to patient:  No (Not Released) Next appt:  07/05/2016 at 11:30 AM in Cardiology (HenSinclair Grooms)   Ref Range & Units 15:22  Procalcitonin ng/mL 0.12   Comments:     Interpretation:  PCT (Procalcitonin) <= 0.5 ng/mL:  Systemic infection (sepsis) is not likely.  Local bacterial infection is possible.  (NOTE)      ICU PCT Algorithm        Non ICU PCT Algorithm   ----------------------------   ------------------------------      PCT < 0.25 ng/mL         PCT < 0.1 ng/mL    Stopping of antibiotics      Stopping of antibiotics     strongly encouraged.        strongly encouraged.   ----------------------------   ------------------------------     PCT level decrease by        PCT < 0.25 ng/mL     >= 80% from peak PCT     OR PCT 0.25 - 0.5 ng/mL     Stopping of antibiotics                        encouraged.    Stopping  of antibiotics       encouraged.   ----------------------------   ------------------------------     PCT level decrease by       PCT >= 0.25 ng/mL     < 80% from peak PCT     AND PCT >= 0.5 ng/mL      Continuing antibiotics                        encouraged.     Continuing antibiotics       encouraged.   ----------------------------   ------------------------------    PCT level increase compared     PCT > 0.5 ng/mL      with peak PCT AND      PCT >= 0.5 ng/mL       Escalation of antibiotics                      strongly encouraged.    Escalation of antibiotics     strongly encouraged.   Resulting Agency  [facility]    Specimen Collected: 05/24/16 15:22 Last Resulted: 05/24/16 16:40         Imaging results:  Dg Chest 2 View  Result Date: 05/24/2016 CLINICAL DATA:  New oxygen requirement. Fever and vomiting all night. Concern for pneumonia. EXAM: CHEST  2 VIEW COMPARISON:  07/20/2015 and chest CT 11/22/2011 FINDINGS: Heart is enlarged. There is patchy density at the right lung base consistent with early infiltrate. Opacity at the left lung base partially obscures the hemidiaphragm and is consistent with atelectasis or early infiltrate. Suspect small left pleural effusion. Dense opacities overlie the lung apices and are consistent with pleural based calcifications as seen on prior exams. Prior vertebroplasty changes noted. IMPRESSION: 1. Cardiomegaly. 2. Increased bibasilar densities consistent atelectasis or early infiltrates. Small left effusion. Electronically Signed   By: Nolon Nations M.D.   On: 05/24/2016 11:41    Other results: EKG: atrial fibrillation, rate 108, QTc 470, paired PVCs.  Assessment & Plan by Problem: Active Problems:   Sepsis ([place])  In summary, Mr. Maclellan is an 82 y.o. Male with history of CHF, A-fib, osteomyelitis, and myasthenia gravis who  presents with "dry heaving all night" and is found to have tachycardia, tachypnea, intermittent hypotension, leukocytosis, and a warm, erythematous LLE with 2 open wounds on his second digit in the setting of known osteomyelitis.  Sepsis: This patient's qSOFA score is 2/3  (has had BPs <40 systolic and a RR of 25), meaning he has a 6% likelihood  of a bad outcome and sepsis is likely. Patient has sepsis as he meets 3/4 SIRS criteria (leukocytosis, HR 103, RR 25) and has a suspected source of infection with warmth and erythema of the LLE combined with wounds and known infection of 2nd digit on left foot representing likely cellulitis. Septicemia from known osteomyelitis is also possible, blood culture, ESR, CRP pending. CAP is a possibility, although less likely given absence of respiratory symptoms and normal lung exam. Patient has chronic SOB which is unchanged and occasionally requires supplemental O2, but is currently on 4L due to initial O2 sat of 63% in ED that rapidly corrected, which could support CAP. CXR showed cardiomegaly, increased bibasilar densities consistent with atelectasis or early infiltrates, small left effusion. Comparison of this film to prior imaging studies showed unchanged pleural calcifications and fairly consistent opacities which raises doubts about pneumonia; will repeat films in the morning. Previous episodes of sepsis were due to UTIs, but this is less likely as UA was not concerning for infection; urine culture pending. Of note, pro-calcitonin of 0.12 means sepsis is less likely, normal lactic acids so cannot rule in severe sepsis. Other possible explanations for vital signs include tachycardia from A-fib, increased RR consistent with chronic dyspnea, and leukocytosis from localized infection of LLE. - Ceftriaxone 1g daily IV to cover probably cellulitis + CAP - Follow up repeat CXR - Follow up ESR/CRP to evaluate osteomyelitis, consider adding MRSA coverage if markedly  elevated  Myasthenia Gravis: Patient had myasthenic crisis with previous episode of sepsis, daughter reports he has been prophylactically treated with 2x normal prednisone dose on subsequent admissions. Patient denies eyelid weakness or dysphagia. Outpatient regimen is prednisone 20 mg daily. Minor illnesses can be treated by increasing the dose of corticosteroid to 2-3x daily dose for 3 days. In patients with potential septic shock, regimen is 200-300 mg of hydrocortisone in divided doses (50 mg q6hrs or 100 mg q8hrs).  - Hydrocortisone sodium succinate 100 mg q8hrs IV  VS - Prednisone 40-60 mg daily  CHF: 06/11/2014 echocardiogram shows EF 35-40%, grade IV mitral regurgitation, small pericardial effusion. Outpatient regimen is furosemide 20 mg BID. Patient denies increased dyspnea, lungs clear on exam, pitting edema 2+ bilateral lower extremities. Patient is currently intermittently hypotensive; will hold outpatient medications.  Atrial Fibrillation: PT elevated at 33.0 seconds, INR supratherapeutic at 3.14. Currently in A-fib, no current bleeding, will get pharmacy assistance for warfarin dosing to maintain adequate anti-coagulation and minimize bleeding risk. Patient takes metoprolol tartrate 25 mg for rate control, will hold for now in setting of intermittent hypotension. - Pharmacist dosing of warfarin  Hypokalemia: 2.7 at 1100 today in ER, previously 3.8 on 8/23. Received 40 mEq tab PO in ER, will continue repletion. - KCl 10 mEq in 100 mL IV every 1hr x4 - Follow up repeat BMP  Nausea/Dry Heaving: No further dry heaving post-Zofran in ER. - Ondansetron 4 mg IV PRN  F IVNS 75 ml/hr  E Hypokalemia  N Heart healthy diet  DVT Ppx Supratheraputic INR (3.1) Code Status DNR, DNI, but central line and pressors are ok per discussion with patient and daughter Chauncey Reading)   Dispo: Admit patient to Internal Medicine Teaching Service  This is a Medical Student Note.  The care of the patient  was discussed with Dr. Hetty Ely and the assessment and plan was formulated with their assistance.  Please see their note for official documentation of the patient encounter.   Signed: Lawson Fiscal, Medical Student 05/24/2016, 2:53 PM

## 2016-05-24 NOTE — H&P (Signed)
Date: 05/24/2016               Patient Name:  Richard Bradley MRN: [id number]  DOB: [birth year] Age / Sex: 82 y.o., male   PCP: Maisie Fus, MD ([place], [place])          Medical Service: Internal Medicine Teaching Service         Attending Physician: Dr. Lucious Groves, DO    First Contact: Dr. Ledell Noss  Pager: [phone]  Second Contact: Dr. Maryellen Pile Pager: [phone]       After Hours (After 5p/  First Contact Pager: [phone]  weekends / holidays): Second Contact Pager: [phone]   Chief Complaint: sick to my stomach in pain all night   History of Present Illness: [name] is a 82 y.o. male with a PMH of myasthenia gravis, atrial fibrillation, HFrEF, prostate CA (s/p prostatectomy) who presents with decreased appetite. The majority of this history and review of systems was taken from the patients daughter as he did not sleep last night and is lethargic. She states that he was in his usual state of health until yesterday evening when he developed a decreased appetite. He did not take his home medications prednisone or lopressor last night because of the decreased appetite. At 8:30 pm he started dry heaving and this continued for the entire night. This morning he felt weak and needed help getting out of bed. His daughter fed him breakfast then he went back to sleep immediately. His daughter took his temperature multiple times but he remained afebrile. He has shortness of breath at baseline at home. Her develops dyspnea with getting dressed in the morning. He has an oxygen tank at home and checks his pulse ox regularly, he uses oxygen about once per month. This morning his pulse ox read 84 %, his daughter placed him on 2 L O2 and his SpO2 improved to 96. He recently had his left great toe osteomyelitis and amputation 8/23, he was started on doxycycline for one month and at follow up was found to have his left second toe had developed ulcers and osteomyelitis so doxycycline was  continued for another month. He has had some orthopnea but denies leg swelling. He denies dysuria, diarrhea, muscle pain, cough, congestion, sick contacts, or fever.  Of note, he has had sepsis a few times in the past - those episodes were related to e.coli UTI but were different in a few ways- he had no dry heaving, and was more lethargic, had a fever at home, and was non coherent.  In the ED he was febrile 100.3, Afib HR 100s, normotensive and sating at 63% on room air.  Labs showed K 2.7, WBC 13.3,  pro calcitonin 0.12, lactic acid 1.75,  INR 3.1 chest xray showed pleural calcifications. Code sepsis was called given 2 liters NS bolus. Blood cultures were drawn, urine cultures were collected and he was given one dose of ceftriaxone,  Azithromycin, zofran, K-Dur 40 meq tablet and admitted for sepsis.   Meds:  Prescriptions Prior to Admission  Medication Sig Dispense Refill Last Dose  . Cholecalciferol (VITAMIN D-3 PO) Take 2,000 Units by mouth daily.    05/24/2016 at Unknown time  . FIBER PO Take 1 tablet by mouth 4 (four) times daily.    05/24/2016 at Unknown time  . fluticasone (FLONASE) 50 MCG/ACT nasal spray Place 2 sprays into both nostrils daily as needed for allergies or rhinitis. (Patient taking differently: Place  2 sprays into both nostrils 2 (two) times daily. ) 16 g 6 05/23/2016 at Unknown time  . furosemide (LASIX) 20 MG tablet Take 1 tablet (20 mg total) by mouth 2 (two) times daily. (Patient taking differently: Take 20 mg by mouth daily. ) 30 tablet 1 05/24/2016 at Unknown time  . HYDROcodone-acetaminophen (NORCO) 10-325 MG per tablet Take 1 tablet by mouth every 5 (five) hours as needed for moderate pain.    05/24/2016 at Unknown time  . metoprolol tartrate (LOPRESSOR) 25 MG tablet Take 25 mg by mouth 2 (two) times daily.    05/24/2016 at 0730  . predniSONE (DELTASONE) 10 MG tablet Take 2 tablets (20 mg total) by mouth daily. 180 tablet 3 05/24/2016 at Unknown time  . vitamin B-12  (CYANOCOBALAMIN) 1000 MCG tablet Take 1,000 mcg by mouth daily.   05/23/2016 at Unknown time  . warfarin (COUMADIN) 5 MG tablet Take 2.5-5 mg by mouth daily. M,W, F trake 1 whole tab. All other days 1/2 tab   05/23/2016 at Unknown time     Allergies: Allergies as of 05/24/2016  . (No Known Allergies)   Past Medical History:  Diagnosis Date  . Atrial fibrillation ([place])   . Cellulitis    left arm  . CHF (congestive heart failure) ([place])   . Dysrhythmia    hx of atrial fibrilation  . GERD (gastroesophageal reflux disease)    ocassional  . History of kidney stones   . Myasthenia gravis   . Osteoarthritis   . Prostate cancer ([place]) dx'd 1982  . Sepsis ([place])   . Shortness of breath dyspnea    with exertion  . Urine incontinence   . Varicose veins of left lower extremity     Family History:  Family History  Problem Relation Age of Onset  . Heart disease Father   . Heart disease Brother   . Heart disease Paternal Grandfather   . Stroke Mother   . Parkinsonism Sister   . Heart disease Sister   . Dementia Sister     Social History:  His wife passed away in 12/01/22 of this year so he moved from [place] to [place] to live with his daughter. He drinks 1 beer every now and then.   Review of Systems: A complete ROS was negative except as per HPI.   Physical Exam: Vitals:   05/24/16 1330 05/24/16 1400 05/24/16 1449 05/24/16 1505  BP: 1_0 (!) 89/62  Pulse:  106 102 (!) 55  Resp: (!) _1 Temp:    97.8 F (36.6 C)  TempSrc:    Oral  SpO2:  96% 90% 95%  Weight:    173 lb 11.6 oz (78.8 kg)  Height:    _2  (1.88 m)   Physical Exam  Constitutional: He appears lethargic. No distress.  He falls asleep and comes in and out of conversation   HENT:  Head: Normocephalic and atraumatic.  Eyes: Pupils are equal, round, and reactive to light.  Bilateral lid lag   Neck: Normal range of motion. Neck supple. No thyromegaly present.  His head droops over    Cardiovascular: An irregularly irregular rhythm present.  No murmur heard. Pulmonary/Chest: Effort normal. He has no wheezes. He has no rales.  Abdominal: Soft. He exhibits no distension. There is no tenderness.  Musculoskeletal: He exhibits edema.  1+ bilateral pitting edema   Lymphadenopathy:    He has no cervical adenopathy.  Neurological: He appears lethargic.  Skin: There is erythema.  Lower left leg erythematous and more heat to palpation than right, non swollen    Labs: CBC:  Recent Labs Lab 05/24/16 1100  WBC 13.3*  HGB 13.0  HCT 39.6  MCV 100.3*  PLT 277   Basic Metabolic Panel:  Recent Labs Lab 05/24/16 1100  NA 139  K 2.7*  CL 104  CO2 25  GLUCOSE 125*  BUN 12  CREATININE 0.91  CALCIUM 8.1*   Cardiac Enzymes:  Recent Labs Lab 05/24/16 1117  TROPIPOC 0.01   Coagulation Studies:  Recent Labs  05/24/16 1421 05/24/16 1522  LABPROT 24.7* 33.0*  INR 2.19 3.14   Liver Function Tests:  Recent Labs Lab 05/24/16 1100  AST 23  ALT 16*  ALKPHOS 45  BILITOT 2.1*  PROT 5.2*  ALBUMIN 3.3*    Recent Labs Lab 05/24/16 1100  LIPASE 20   Urine Studies: Urinalysis    Component Value Date/Time   COLORURINE YELLOW 05/24/2016 1115   APPEARANCEUR CLOUDY (A) 05/24/2016 1115   LABSPEC 1.015 05/24/2016 1115   PHURINE 7.0 05/24/2016 1115   GLUCOSEU NEGATIVE 05/24/2016 1115   HGBUR SMALL (A) 05/24/2016 1115   BILIRUBINUR NEGATIVE 05/24/2016 1115   KETONESUR 15 (A) 05/24/2016 1115   PROTEINUR NEGATIVE 05/24/2016 1115   NITRITE NEGATIVE 05/24/2016 1115   LEUKOCYTESUR NEGATIVE 05/24/2016 1115   EKG: EKG: atrial fibrillation   Imaging: Dg Chest 2 View  Result Date: 05/24/2016 CLINICAL DATA:  New oxygen requirement. Fever and vomiting all night. Concern for pneumonia. EXAM: CHEST  2 VIEW COMPARISON:  07/20/2015 and chest CT 11/22/2011 FINDINGS: Heart is enlarged. There is patchy density at the right lung base consistent with early infiltrate.  Opacity at the left lung base partially obscures the hemidiaphragm and is consistent with atelectasis or early infiltrate. Suspect small left pleural effusion. Dense opacities overlie the lung apices and are consistent with pleural based calcifications as seen on prior exams. Prior vertebroplasty changes noted. IMPRESSION: 1. Cardiomegaly. 2. Increased bibasilar densities consistent atelectasis or early infiltrates. Small left effusion. Electronically Signed   By: Nolon Nations M.D.   On: 05/24/2016 11:41   Assessment & Plan by Problem: Active Problems:   Sepsis [facility]) Mr. YIFAN AUKER is a 82 y.o. male with PMH myasthenia gravis, atrial fibrillation, HFrEF, prostate CA (s/p prostatectomy). Presented today with decreased appetite and weakness. In the ED he was found to have febrile with leukocytosis but normal lactic acid and procalcitonin.  The cause of these symptoms is unclear but may be related to infection. He has a known diagnosis of of osteomyelitis in his toe on exam he has an erythematous right leg which is hot to touch. These symptoms may be related to this known osteomyelitis. He is having shortness of breath and requires 4 L of oxygen in the ED, chest xray shows pleural calcifications but no consolidation and he denies cough. I doubt that his presentation is related to pneumonia. He denies dysuria and urinalysis was negative for nitrites and leukocytes which would makes a urinary tract infection unlikely.  Continue ceftriazone IV zofran PRN  Follow up repeat chest xray tomorrow  Follow up flu panel  Follow up CRP, ESR   Trend procalcitonin  Continue nasal canula  Ordered orthostatic vitals   Hypokalemia  He has been feeling weakness since yesterday evening K 2.7 on admission. He received 1 dose of K-DUR 40 meq. We have ordered 4 runs of KCl 10 meq.  Follow up morning bmet  Atrial fibrillation  He is currently in rate controlled afib. Home medications include metoprolol 25 mg BID  and warfarin 5 mg M, W, F and 2.5 mg every other day. On admission he has supratheraputic INR 3.1. We have ordered warfarin through pharmacology consult.  Admit to telemetry  CHF  Echo 05/2014 : EF 35-40%, grade IV mitral regurgitation, small pericardial effusion. His breath sounds are clear to auscultation but he has 2+ pitting edema. Home medications include metoprolol 25 mg BID and Lasix 20 mg BID. He is hypotensive now so we will hold lasix and metoprolol.   Myasthenia gravis  He is on prednisone 10 mg daily at home but skipped his evening dose. He has a history of myasthenia crisis during prior hospitalization for sepsis.  Ordered IV solu cortef 100 mg q 8 hours   F IVNS 75 ml/hr  E hypokalemia  N heart healthy diet  DVT Ppx supratheraputic INR  Code Status DNR, DNI, His daughter is his HCPOA and says that PRESSORS ARE OK.   Dispo: Admit patient to Inpatient with expected length of stay greater than 2 midnights.  Signed: Ledell Noss, MD 05/24/2016, 5:51 PM  Pager: [phone]

## 2016-05-24 NOTE — ED Triage Notes (Signed)
Pt brought here by daughter for vomiting, weakness.  Pt has had to use home O2 for O2 sats of 84 at home.

## 2016-05-24 NOTE — Progress Notes (Addendum)
ANTICOAGULATION CONSULT NOTE - Initial Consult  Pharmacy Consult:  Coumadin Indication: atrial fibrillation  No Known Allergies  Patient Measurements: Height: 6\' 2"  (188 cm) Weight: 173 lb 11.6 oz (78.8 kg) IBW/kg (Calculated) : 82.2  Vital Signs: Temp: 97.8 F (36.6 C) (10/27 1505) Temp Source: Oral (10/27 1505) BP: 89/62 (10/27 1505) Pulse Rate: 55 (10/27 1505)  Labs:  Recent Labs  05/24/16 1100 05/24/16 1421  HGB 13.0  --   HCT 39.6  --   PLT 159  --   LABPROT  --  24.7*  INR  --  2.19  CREATININE 0.91  --     Estimated Creatinine Clearance: 62.5 mL/min (by C-G formula based on SCr of 0.91 mg/dL).   Medical History: Past Medical History:  Diagnosis Date  . Atrial fibrillation ([place])   . Cellulitis    left arm  . CHF (congestive heart failure) ([place])   . Dysrhythmia    hx of atrial fibrilation  . GERD (gastroesophageal reflux disease)    ocassional  . History of kidney stones   . Myasthenia gravis   . Osteoarthritis   . Prostate cancer ([place]) dx'd 1982  . Sepsis ([place])   . Shortness of breath dyspnea    with exertion  . Urine incontinence   . Varicose veins of left lower extremity        Assessment: 26 YOM admitted with PNA.  Pharmacy consulted to continue Coumadin from PTA for history of Afib.  INR therapeutic on home regimen.  Noted that patient is on azithromycin for PNA, which could increase the effect of Coumadin.  No bleeding reported.   Goal of Therapy:  INR 2-3    Plan:  - Coumadin 5mg  PO today - Daily PT / INR - F/U change azith to PO - Consider additional KCL supplementation   Mary-Ann Pennella D. Mina Marble, PharmD, BCPS Pager:  [phone] 05/24/2016, 3:16 PM    ================================   Addendum: - Coumadin dosing above was based on an INR of 2.19.  Another INR was reported one hour after the previous INR, and it is supra-therapeutic at 3.14.  Unsure of accuracies of INRs, but would prefer not to obtain another lab sample since  patient has already had 3 lab draws today.   Plan: - Reduce Coumadin to 2.5mg  PO today - F/U INR in AM   Sears Oran D. Mina Marble, PharmD, BCPS Pager:  [phone] 05/24/2016, 4:23 PM

## 2016-05-24 NOTE — Progress Notes (Signed)
Pharmacy Antibiotic Note  Richard Bradley is a 81 y.o. male admitted on 05/24/2016 with pneumonia.  Pharmacy has been consulted for ceftriaxone and azithromycin dosing. CC vomiting and weakness. Had O2 Sats 84% at home. K low at 2.7.  Plan: Azithromycin 500 mg IV q24h Ceftriaxone 1g IV q24h Monitor clinical course  Height: 6\' 2"  (188 cm) Weight: 188 lb (85.3 kg) IBW/kg (Calculated) : 82.2  Temp (24hrs), Avg:99.4 F (37.4 C), Min:98.6 F (37 C), Max:100.3 F (37.9 C)   Recent Labs Lab 05/24/16 1100 05/24/16 1119  WBC 13.3*  --   CREATININE 0.91  --   LATICACIDVEN  --  1.75    Estimated Creatinine Clearance: 65.2 mL/min (by C-G formula based on SCr of 0.91 mg/dL).   Clearance is stable  No Known Allergies  Antimicrobials this admission: Azithromycin 10/27 >> Ceftriaxone 10/27 >>  Dose adjustments this admission:  N/A  Microbiology results:  10/27 BCx: Sent 10/27 UCx: sent Thank you for allowing pharmacy to be a part of this patient's care.  Cheral Almas, PharmD Candidate 05/24/2016 1:25 PM

## 2016-05-24 NOTE — ED Provider Notes (Signed)
[facility] DEPT Provider Note   CSN: [id number] Arrival date & time: 05/24/16  1027     History   Chief Complaint Chief Complaint  Patient presents with  . Emesis  . Nausea    HPI Richard Bradley is a 82 y.o. male with a pmhx of atrial fibrillation, CHF, myasthenia gravis, who presents to the ED today c/o N/V. Patient states that last night after eating dinner she began feeling very nauseated and subsequently began dry heaving. He has had increased lethargy is feeling very tired. Patient states that he occasionally wears oxygen at home but began feeling short of breath so he put himself on 2 L of home oxygen which is unusual for him. Patient's daughter is at bedside reports that his temperature has continued to rise since this morning, last taken orally was 99.6. He denies any abdominal pain, diarrhea, chest pain, dysuria. Patient's daughter states that patient has had 3 episodes of sepsis that is typically been from a urinary source. Patient reports that he is a DO NOT RESUSCITATE/DO NOT INTUBATE.  HPI  Past Medical History:  Diagnosis Date  . Atrial fibrillation ([place])   . Cellulitis    left arm  . CHF (congestive heart failure) ([place])   . Dysrhythmia    hx of atrial fibrilation  . GERD (gastroesophageal reflux disease)    ocassional  . History of kidney stones   . Myasthenia gravis   . Osteoarthritis   . Prostate cancer ([place]) dx'd 1982  . Sepsis ([place])   . Shortness of breath dyspnea    with exertion  . Urine incontinence   . Varicose veins of left lower extremity     Patient Active Problem List   Diagnosis Date Noted  . Cellulitis of arm, left 10/12/2014  . Cellulitis of left upper extremity 10/12/2014  . Cellulitis of hand, left 10/12/2014  . Insomnia 08/12/2014  . Encounter for therapeutic drug monitoring 10/26/2013  . 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 Seasonal allergic rhinitis 05/26/2012  . DOE (dyspnea on exertion) 03/24/2012  . Myasthenia  gravis ([place]) 06/27/2011  . Prostate cancer ([place])   . Atrial fibrillation ([place])   . Osteoarthritis     Past Surgical History:  Procedure Laterality Date  . AMPUTATION Left 03/20/2016   Procedure: Left Great Toe Amputation at Metatarsophalangeal Joint;  Surgeon: Newt Minion, MD;  Location: [facility];  Service: Orthopedics;  Laterality: Left;  . appendectomy    . APPENDECTOMY    . COLONOSCOPY    . HERNIA REPAIR Bilateral    Inguinal  . I&D EXTREMITY Left 10/12/2014   Procedure: IRRIGATION AND DEBRIDEMENT INDEX FINGER;  Surgeon: Iran Planas, MD;  Location: [facility];  Service: Orthopedics;  Laterality: Left;  . KYPHOPLASTY  2012 ?   T 12- L1  . LAMINECTOMY  2005 ?   lumbar- 4-5  . PROSTATE SURGERY    . ROTATOR CUFF REPAIR Right   . TOTAL KNEE ARTHROPLASTY Right        Home Medications    Prior to Admission medications   Medication Sig Start Date End Date Taking? Authorizing Provider  Cholecalciferol (VITAMIN D-3 PO) Take 2,000 Units by mouth daily.    Yes Historical Provider, MD  FIBER PO Take 1 tablet by mouth 4 (four) times daily.    Yes Historical Provider, MD  fluticasone (FLONASE) 50 MCG/ACT nasal spray Place 2 sprays into both nostrils daily as needed for allergies or rhinitis. Patient taking differently: Place 2 sprays into  both nostrils 2 (two) times daily.  07/20/15  Yes Robyn Haber, MD  furosemide (LASIX) 20 MG tablet Take 1 tablet (20 mg total) by mouth 2 (two) times daily. Patient taking differently: Take 20 mg by mouth daily.  07/20/15  Yes Robyn Haber, MD  HYDROcodone-acetaminophen (NORCO) 10-325 MG per tablet Take 1 tablet by mouth every 5 (five) hours as needed for moderate pain.    Yes Historical Provider, MD  metoprolol tartrate (LOPRESSOR) 25 MG tablet Take 25 mg by mouth 2 (two) times daily.    Yes Historical Provider, MD  predniSONE (DELTASONE) 10 MG tablet Take 2 tablets (20 mg total) by mouth daily. 04/19/16  Yes Kathrynn Ducking, MD  vitamin B-12  (CYANOCOBALAMIN) 1000 MCG tablet Take 1,000 mcg by mouth daily.   Yes Historical Provider, MD  warfarin (COUMADIN) 5 MG tablet Take 2.5-5 mg by mouth daily. M,W, F trake 1 whole tab. All other days 1/2 tab   Yes Historical Provider, MD    Family History Family History  Problem Relation Age of Onset  . Heart disease Father   . Heart disease Brother   . Heart disease Paternal Grandfather   . Stroke Mother   . Parkinsonism Sister   . Heart disease Sister   . Dementia Sister     Social History Social History  Substance Use Topics  . Smoking status: Never Smoker  . Smokeless tobacco: Never Used  . Alcohol use 2.4 - 3.0 oz/week    4 - 5 Cans of beer per week     Allergies   Review of patient's allergies indicates no known allergies.   Review of Systems Review of Systems  All other systems reviewed and are negative.    Physical Exam Updated Vital Signs BP 102/77   Pulse 103   Temp 100.3 F (37.9 C) (Rectal)   Resp 25   Ht 6\' 2"  (1.88 m)   Wt 85.3 kg   SpO2 92%   BMI 24.14 kg/m   Physical Exam  Constitutional: He is oriented to person, place, and time. No distress.  Ill-appearing, elderly male  HENT:  Head: Normocephalic and atraumatic.  Mouth/Throat: No oropharyngeal exudate.  Eyes: Conjunctivae and EOM are normal. Pupils are equal, round, and reactive to light. Right eye exhibits no discharge. Left eye exhibits no discharge. No scleral icterus.  Cardiovascular: Normal rate, regular rhythm, normal heart sounds and intact distal pulses.  Exam reveals no gallop and no friction rub.   No murmur heard. Pulmonary/Chest: Effort normal and breath sounds normal. No respiratory distress. He has no wheezes. He has no rales. He exhibits no tenderness.  Abdominal: Soft. He exhibits no distension. There is no tenderness. There is no guarding.  Musculoskeletal: Normal range of motion. He exhibits no edema.  Neurological: He is alert and oriented to person, place, and time.    Skin: Skin is warm and dry. No rash noted. He is not diaphoretic. No erythema. No pallor.  Psychiatric: He has a normal mood and affect. His behavior is normal.  Nursing note and vitals reviewed.    ED Treatments / Results  Labs (all labs ordered are listed, but only abnormal results are displayed) Labs Reviewed  COMPREHENSIVE METABOLIC PANEL - Abnormal; Notable for the following:       Result Value   Potassium 2.7 (*)    Glucose, Bld 125 (*)    Calcium 8.1 (*)    Total Protein 5.2 (*)    Albumin 3.3 (*)  ALT 16 (*)    Total Bilirubin 2.1 (*)    All other components within normal limits  CBC - Abnormal; Notable for the following:    WBC 13.3 (*)    RBC 3.95 (*)    MCV 100.3 (*)    All other components within normal limits  URINALYSIS, ROUTINE W REFLEX MICROSCOPIC (NOT AT [facility]) - Abnormal; Notable for the following:    APPearance CLOUDY (*)    Hgb urine dipstick SMALL (*)    Ketones, ur 15 (*)    All other components within normal limits  URINE MICROSCOPIC-ADD ON - Abnormal; Notable for the following:    Squamous Epithelial / LPF 0-5 (*)    All other components within normal limits  URINE CULTURE  CULTURE, BLOOD (ROUTINE X 2)  CULTURE, BLOOD (ROUTINE X 2)  LIPASE, BLOOD  I-STAT CG4 LACTIC ACID, ED  I-STAT TROPOININ, ED    EKG  EKG Interpretation  Date/Time:  Friday May 24 2016 10:34:40 EDT Ventricular Rate:  108 PR Interval:    QRS Duration: 119 QT Interval:  359 QTC Calculation: 470 R Axis:   -76 Text Interpretation:  Atrial fibrillation Paired ventricular premature complexes Incomplete RBBB and LAFB Nonspecific T abnormalities, lateral leads Confirmed by Lacinda Axon  MD, BRIAN ([id number]) on 05/24/2016 12:13:34 PM       Radiology Dg Chest 2 View  Result Date: 05/24/2016 CLINICAL DATA:  New oxygen requirement. Fever and vomiting all night. Concern for pneumonia. EXAM: CHEST  2 VIEW COMPARISON:  07/20/2015 and chest CT 11/22/2011 FINDINGS: Heart is enlarged.  There is patchy density at the right lung base consistent with early infiltrate. Opacity at the left lung base partially obscures the hemidiaphragm and is consistent with atelectasis or early infiltrate. Suspect small left pleural effusion. Dense opacities overlie the lung apices and are consistent with pleural based calcifications as seen on prior exams. Prior vertebroplasty changes noted. IMPRESSION: 1. Cardiomegaly. 2. Increased bibasilar densities consistent atelectasis or early infiltrates. Small left effusion. Electronically Signed   By: Nolon Nations M.D.   On: 05/24/2016 11:41    Procedures Procedures (including critical care time)  CRITICAL CARE Performed by: Carlos Levering   Total critical care time: 45 minutes  Critical care time was exclusive of separately billable procedures and treating other patients.  Critical care was necessary to treat or prevent imminent or life-threatening deterioration.  Critical care was time spent personally by me on the following activities: development of treatment plan with patient and/or surrogate as well as nursing, discussions with consultants, evaluation of patient's response to treatment, examination of patient, obtaining history from patient or surrogate, ordering and performing treatments and interventions, ordering and review of laboratory studies, ordering and review of radiographic studies, pulse oximetry and re-evaluation of patient's condition.   Medications Ordered in ED Medications  ondansetron (ZOFRAN) injection 4 mg (not administered)  cefTRIAXone (ROCEPHIN) 1 g in dextrose 5 % 50 mL IVPB (1 g Intravenous New Bag/Given 05/24/16 1234)  azithromycin (ZITHROMAX) 500 mg in dextrose 5 % 250 mL IVPB (500 mg Intravenous New Bag/Given 05/24/16 1246)  ondansetron (ZOFRAN) injection 4 mg (4 mg Intravenous Given 05/24/16 1112)  sodium chloride 0.9 % bolus 1,000 mL (1,000 mLs Intravenous New Bag/Given 05/24/16 1149)  potassium  chloride SA (K-DUR,KLOR-CON) CR tablet 40 mEq (40 mEq Oral Given 05/24/16 1234)  sodium chloride 0.9 % bolus 1,000 mL (1,000 mLs Intravenous New Bag/Given 05/24/16 1233)     Initial Impression / Assessment and Plan / ED  Course  I have reviewed the triage vital signs and the nursing notes.  Pertinent labs & imaging results that were available during my care of the patient were reviewed by me and considered in my medical decision making (see chart for details).  Clinical Course    82 year old male with history of A. fib, CHF presents the ED today with initial complaint of emesis and nausea onset last night. Patient is also had increased lethargy. On presentation to the ED, patient appears very drowsy but is arousable and able to answer questions. He is tachycardic with a heart rate in the 120s. Rectal temp 100.3. Normotensive. He now has new oxygen requirement, on 4 L in the ED. Initially O2 sat was 63%, placed on oxygen. Now his O2 saturation is 95%. Leukocytosis present. Code sepsis was called. Chest x-ray shows infiltration, likely pneumonia. Patient was given IV ceftriaxone and azithromycin to cover for community acquired pneumonia. No lactic acidosis. Blood cultures and urine cultures obtained. UA does not show sign of infection. Patient was fluid resuscitated per sepsis protocol. Patient is now feeling better after receiving fluids and Zofran. No further episodes of emesis while in the ED. Spoke with the internal medicine teaching service who will admit patient to telemetry. Admitting attending is Dr.Hoffman.  Patient was discussed with and seen by Dr. Lacinda Axon who agrees with the treatment plan.    Final Clinical Impressions(s) / ED Diagnoses   Final diagnoses:  Sepsis, due to unspecified organism [facility])  Community acquired pneumonia, unspecified laterality    New Prescriptions New Prescriptions   No medications on file     Carlos Levering, PA-C 05/24/16 [place],  MD 05/26/16 [phone]

## 2016-05-24 NOTE — ED Notes (Signed)
RN getting first set of blood cultures.

## 2016-05-25 ENCOUNTER — Inpatient Hospital Stay (HOSPITAL_COMMUNITY): Payer: Medicare Other

## 2016-05-25 DIAGNOSIS — I5032 Chronic diastolic (congestive) heart failure: Secondary | ICD-10-CM

## 2016-05-25 DIAGNOSIS — Z8546 Personal history of malignant neoplasm of prostate: Secondary | ICD-10-CM

## 2016-05-25 DIAGNOSIS — G7000 Myasthenia gravis without (acute) exacerbation: Secondary | ICD-10-CM

## 2016-05-25 DIAGNOSIS — J929 Pleural plaque without asbestos: Secondary | ICD-10-CM

## 2016-05-25 DIAGNOSIS — E876 Hypokalemia: Secondary | ICD-10-CM

## 2016-05-25 DIAGNOSIS — Z89412 Acquired absence of left great toe: Secondary | ICD-10-CM

## 2016-05-25 DIAGNOSIS — I480 Paroxysmal atrial fibrillation: Secondary | ICD-10-CM

## 2016-05-25 DIAGNOSIS — A419 Sepsis, unspecified organism: Principal | ICD-10-CM

## 2016-05-25 DIAGNOSIS — Z7901 Long term (current) use of anticoagulants: Secondary | ICD-10-CM

## 2016-05-25 DIAGNOSIS — Z9079 Acquired absence of other genital organ(s): Secondary | ICD-10-CM

## 2016-05-25 DIAGNOSIS — Z823 Family history of stroke: Secondary | ICD-10-CM

## 2016-05-25 DIAGNOSIS — Z8739 Personal history of other diseases of the musculoskeletal system and connective tissue: Secondary | ICD-10-CM

## 2016-05-25 DIAGNOSIS — Z820 Family history of epilepsy and other diseases of the nervous system: Secondary | ICD-10-CM

## 2016-05-25 DIAGNOSIS — Z7951 Long term (current) use of inhaled steroids: Secondary | ICD-10-CM

## 2016-05-25 DIAGNOSIS — J189 Pneumonia, unspecified organism: Secondary | ICD-10-CM

## 2016-05-25 DIAGNOSIS — Z8249 Family history of ischemic heart disease and other diseases of the circulatory system: Secondary | ICD-10-CM

## 2016-05-25 DIAGNOSIS — Z66 Do not resuscitate: Secondary | ICD-10-CM

## 2016-05-25 LAB — CBC
HCT: 37.9 % — ABNORMAL LOW (ref 39.0–52.0)
Hemoglobin: 12.1 g/dL — ABNORMAL LOW (ref 13.0–17.0)
MCH: 32.6 pg (ref 26.0–34.0)
MCHC: 31.9 g/dL (ref 30.0–36.0)
MCV: 102.2 fL — AB (ref 78.0–100.0)
PLATELETS: 151 10*3/uL (ref 150–400)
RBC: 3.71 MIL/uL — AB (ref 4.22–5.81)
RDW: 14 % (ref 11.5–15.5)
WBC: 9.9 10*3/uL (ref 4.0–10.5)

## 2016-05-25 LAB — INFLUENZA PANEL BY PCR (TYPE A & B)
H1N1 flu by pcr: NOT DETECTED
INFLAPCR: NEGATIVE
Influenza B By PCR: NEGATIVE

## 2016-05-25 LAB — BASIC METABOLIC PANEL
ANION GAP: 9 (ref 5–15)
BUN: 10 mg/dL (ref 6–20)
CO2: 26 mmol/L (ref 22–32)
Calcium: 7.4 mg/dL — ABNORMAL LOW (ref 8.9–10.3)
Chloride: 106 mmol/L (ref 101–111)
Creatinine, Ser: 0.88 mg/dL (ref 0.61–1.24)
GFR calc Af Amer: >60 mL/min (ref >60)
Glucose, Bld: 138 mg/dL — ABNORMAL HIGH (ref 65–99)
POTASSIUM: 3.6 mmol/L (ref 3.5–5.1)
SODIUM: 141 mmol/L (ref 135–145)

## 2016-05-25 LAB — PROTIME-INR
INR: 2.05
Prothrombin Time: 23.5 seconds — ABNORMAL HIGH (ref 11.4–15.2)

## 2016-05-25 LAB — URINE CULTURE: Culture: <10000 — AB

## 2016-05-25 LAB — GLUCOSE, CAPILLARY
GLUCOSE-CAPILLARY: 132 mg/dL — AB (ref 65–99)
Glucose-Capillary: 173 mg/dL — ABNORMAL HIGH (ref 65–99)

## 2016-05-25 MED ORDER — METOPROLOL TARTRATE 25 MG PO TABS
25.0000 mg | ORAL_TABLET | Freq: Two times a day (BID) | ORAL | Status: DC
  Administered 2016-05-25 – 2016-05-26 (×3): 25 mg via ORAL
  Filled 2016-05-25 (×3): qty 1

## 2016-05-25 MED ORDER — FUROSEMIDE 20 MG PO TABS
20.0000 mg | ORAL_TABLET | Freq: Every day | ORAL | Status: DC
  Administered 2016-05-25 – 2016-05-26 (×2): 20 mg via ORAL
  Filled 2016-05-25 (×2): qty 1

## 2016-05-25 MED ORDER — PREDNISONE 50 MG PO TABS
60.0000 mg | ORAL_TABLET | Freq: Every day | ORAL | Status: DC
  Administered 2016-05-25 – 2016-05-26 (×2): 60 mg via ORAL
  Filled 2016-05-25 (×2): qty 1

## 2016-05-25 MED ORDER — WARFARIN SODIUM 4 MG PO TABS
4.0000 mg | ORAL_TABLET | Freq: Once | ORAL | Status: DC
  Filled 2016-05-25: qty 1

## 2016-05-25 MED ORDER — WARFARIN SODIUM 5 MG PO TABS
5.0000 mg | ORAL_TABLET | Freq: Once | ORAL | Status: AC
  Administered 2016-05-25: 5 mg via ORAL
  Filled 2016-05-25: qty 1

## 2016-05-25 MED ORDER — AZITHROMYCIN 500 MG PO TABS
500.0000 mg | ORAL_TABLET | Freq: Every day | ORAL | Status: AC
  Administered 2016-05-25 – 2016-05-26 (×2): 500 mg via ORAL
  Filled 2016-05-25 (×2): qty 1

## 2016-05-25 NOTE — Discharge Instructions (Addendum)
Richard Bradley,  You had a Pneumonia in your Right Lung Base that what was causing some of your breathing issues. We have started you on some IV Antibiotics while you were here and I would like to finish up a course of antibiotics by mouth when you go home.  Azithromycin - 250 mg daily for 2 more days (Monday and Tuesday) Cefuroxime (Cefdin) - 500 mg twice a day for 4 more days (Monday - Thursday) Prednisone - Take 60 mg tomorrow morning (Monday) and then resume your normal 20 mg daily  You potassium was also low when you came in and dropped again after we started your Lasix back. Lasix can cause you to lose potassium. I am starting you on a potassium supplement. It is 20 mEq of potassium. Take this daily. We will re-check your level when you follow up.  I have sent a message to our clinic to call you to get an appointment in the next week or so for follow up. If you would rather follow up with your doctor in [place] that is fine as well. We will be sending our notes to their office as well.   If you have any questions or concerns do not hesitate to call our clinic at [phone]  Information on my medicine - Coumadin   (Warfarin)  This medication education was reviewed with me or my healthcare representative as part of my discharge preparation.    Why was Coumadin prescribed for you? Coumadin was prescribed for you because you have a blood clot or a medical condition that can cause an increased risk of forming blood clots. Blood clots can cause serious health problems by blocking the flow of blood to the heart, lung, or brain. Coumadin can prevent harmful blood clots from forming. As a reminder your indication for Coumadin is:   Stroke Prevention Because Of Atrial Fibrillation  What test will check on my response to Coumadin? While on Coumadin (warfarin) you will need to have an INR test regularly to ensure that your dose is keeping you in the desired range. The INR (international normalized  ratio) number is calculated from the result of the laboratory test called prothrombin time (PT).  If an INR APPOINTMENT HAS NOT ALREADY BEEN MADE FOR YOU please schedule an appointment to have this lab work done by your health care provider within 7 days. Your INR goal is usually a number between:  2 to 3 or your provider may give you a more narrow range like 2-2.5.  Ask your health care provider during an office visit what your goal INR is.  What  do you need to  know  About  COUMADIN? Take Coumadin (warfarin) exactly as prescribed by your healthcare provider about the same time each day.  DO NOT stop taking without talking to the doctor who prescribed the medication.  Stopping without other blood clot prevention medication to take the place of Coumadin may increase your risk of developing a new clot or stroke.  Get refills before you run out.  What do you do if you miss a dose? If you miss a dose, take it as soon as you remember on the same day then continue your regularly scheduled regimen the next day.  Do not take two doses of Coumadin at the same time.  Important Safety Information A possible side effect of Coumadin (Warfarin) is an increased risk of bleeding. You should call your healthcare provider right away if you experience any of the following: ?  Bleeding from an injury or your nose that does not stop. ? Unusual colored urine (red or dark brown) or unusual colored stools (red or black). ? Unusual bruising for unknown reasons. ? A serious fall or if you hit your head (even if there is no bleeding).  Some foods or medicines interact with Coumadin (warfarin) and might alter your response to warfarin. To help avoid this: ? Eat a balanced diet, maintaining a consistent amount of Vitamin K. ? Notify your provider about major diet changes you plan to make. ? Avoid alcohol or limit your intake to 1 drink for women and 2 drinks for men per day. (1 drink is 5 oz. wine, 12 oz. beer, or 1.5  oz. liquor.)  Make sure that ANY health care provider who prescribes medication for you knows that you are taking Coumadin (warfarin).  Also make sure the healthcare provider who is monitoring your Coumadin knows when you have started a new medication including herbals and non-prescription products.  Coumadin (Warfarin)  Major Drug Interactions  Increased Warfarin Effect Decreased Warfarin Effect  Alcohol (large quantities) Antibiotics (esp. Septra/Bactrim, Flagyl, Cipro) Amiodarone (Cordarone) Aspirin (ASA) Cimetidine (Tagamet) Megestrol (Megace) NSAIDs (ibuprofen, naproxen, etc.) Piroxicam (Feldene) Propafenone (Rythmol SR) Propranolol (Inderal) Isoniazid (INH) Posaconazole (Noxafil) Barbiturates (Phenobarbital) Carbamazepine (Tegretol) Chlordiazepoxide (Librium) Cholestyramine (Questran) Griseofulvin Oral Contraceptives Rifampin Sucralfate (Carafate) Vitamin K   Coumadin (Warfarin) Major Herbal Interactions  Increased Warfarin Effect Decreased Warfarin Effect  Garlic Ginseng Ginkgo biloba Coenzyme Q10 Green tea St. Johns wort    Coumadin (Warfarin) FOOD Interactions  Eat a consistent number of servings per week of foods HIGH in Vitamin K (1 serving =  cup)  Collards (cooked, or boiled & drained) Kale (cooked, or boiled & drained) Mustard greens (cooked, or boiled & drained) Parsley *serving size only =  cup Spinach (cooked, or boiled & drained) Swiss chard (cooked, or boiled & drained) Turnip greens (cooked, or boiled & drained)  Eat a consistent number of servings per week of foods MEDIUM-HIGH in Vitamin K (1 serving = 1 cup)  Asparagus (cooked, or boiled & drained) Broccoli (cooked, boiled & drained, or raw & chopped) Brussel sprouts (cooked, or boiled & drained) *serving size only =  cup Lettuce, raw (green leaf, endive, romaine) Spinach, raw Turnip greens, raw & chopped   These websites have more information on Coumadin (warfarin):   FailFactory.se; VeganReport.com.au;

## 2016-05-25 NOTE — Progress Notes (Addendum)
ANTICOAGULATION CONSULT NOTE - Follow Up Consult  Pharmacy Consult for Warfarin Indication: atrial fibrillation  No Known Allergies  Patient Measurements: Height: 6\' 2"  (188 cm) Weight: 178 lb 12.7 oz (81.1 kg) IBW/kg (Calculated) : 82.2  Vital Signs: Temp: 98.2 F (36.8 C) (10/28 1013) Temp Source: Oral (10/28 1013) BP: 135/64 (10/28 1013) Pulse Rate: 98 (10/28 0416)  Labs:  Recent Labs  05/24/16 1100 05/24/16 1421 05/24/16 1522 05/24/16 2115 05/25/16 0422  HGB 13.0  --   --   --  12.1*  HCT 39.6  --   --   --  37.9*  PLT 159  --   --   --  151  LABPROT  --  24.7* 33.0*  --  23.5*  INR  --  2.19 3.14  --  2.05  CREATININE 0.91  --   --  0.74 0.88    Estimated Creatinine Clearance: 66.6 mL/min (by C-G formula based on SCr of 0.88 mg/dL).   Assessment: 27 YOM who continues on warfarin from PTA for hx Afib. INR today remains therapeutic (INR 2.05, goal of 2-3). Hgb/Hct slight drop, plts wnl - no overt s/sx of bleeding noted.  The patient is on several medications that can increase warfarin sensitivity (Azithromycin, Ceftriaxone, prednisone) - so will monitor this closely. The patient is also noted to have a poor appetite (0% of breakfast and lunch charted). Will dose warfarin cautiously however since a lower dose was given on 10/27 and INR on lower end of range - will increase today.   Addendum: The patient was re-educated on warfarin this afternoon. The patient endorsed a "good lunch" of a hamburger and milkshake that was brought to him by family from [place]. Due to this - will adjust the warfarin dose back to the patient's normal home dose of 5 mg.   Goal of Therapy:  INR 2-3   Plan:  1. Warfarin 5 mg x 1 dose at 1800 today 2. Will continue to monitor for any signs/symptoms of bleeding and will follow up with PT/INR in the a.m.   Thank you for allowing pharmacy to be a part of this patient's care.  Alycia Rossetti, PharmD, BCPS Clinical Pharmacist Pager:  [phone] Clinical phone for 05/25/2016 from 10a-4p: [phone] If after 3:30p, please call main pharmacy at: [phone] 05/25/2016 2:42 PM

## 2016-05-25 NOTE — Evaluation (Signed)
Physical Therapy Evaluation Patient Details Name: Richard Bradley MRN: [id number] DOB: [birth year] Today's Date: 05/25/2016   History of Present Illness  Patient is an 82 yo male with a PMHx of myasthenia gravis, atrial fibrillation, HFrEF, prostate CA (s/p prostatectomy) who was admitted on 05/24/2016 with symptoms of lethargy and decreased appetite. On admission he had temp 100.3, low oxygen saturation which improved with nasal canula, and leukocytosis. Initial chest xray in the ED showed pleural calcifications which were unchanged from prior however repeat chest xray showed lower lobe bilateral consolidation which were suspicious for pneumonia     Clinical Impression  Patient presents with problems listed below.  Will benefit from acute PT to maximize functional mobility prior to discharge home with daughter.  Do not anticipate any f/u PT needs at d/c.    Follow Up Recommendations No PT follow up;Supervision - Intermittent    Equipment Recommendations  None recommended by PT    Recommendations for Other Services       Precautions / Restrictions Precautions Precautions: Fall Restrictions Weight Bearing Restrictions: No      Mobility  Bed Mobility Overal bed mobility: Modified Independent             General bed mobility comments: Increased time  Transfers Overall transfer level: Needs assistance Equipment used: 1 person hand held assist Transfers: Sit to/from Stand Sit to Stand: Min guard         General transfer comment: Assist for balance/safety.  Patient with flexed posture in stance.  Ambulation/Gait Ambulation/Gait assistance: Min guard Ambulation Distance (Feet): 30 Feet Assistive device: 1 person hand held assist Gait Pattern/deviations: Step-through pattern;Decreased step length - right;Decreased step length - left;Decreased stride length;Ataxic;Trunk flexed Gait velocity: decreased   General Gait Details: Patient with ataxic gait, with trunk and LE's  in flexed position.  Patient able to maintain balance during gait with single UE support.  Patient reports gait at baseline.  Stairs            Wheelchair Mobility    Modified Rankin (Stroke Patients Only)       Balance Overall balance assessment: Needs assistance Sitting-balance support: No upper extremity supported;Feet supported Sitting balance-Leahy Scale: Good     Standing balance support: Single extremity supported Standing balance-Leahy Scale: Fair                               Pertinent Vitals/Pain Pain Assessment: No/denies pain    Home Living Family/patient expects to be discharged to:: Private residence Living Arrangements: Children Available Help at Discharge: Family;Available PRN/intermittently Type of Home: House (Above garage apartment in daughter's home.) Home Access: Stairs to enter Entrance Stairs-Rails: Psychiatric nurse of Steps: [place]: One level (One level apartment, but in upstairs of house.) Home Equipment: [place] - 2 wheels;Cane - single point;Shower seat;Bedside commode;Grab bars - tub/shower      Prior Function Level of Independence: Independent with assistive device(s);Needs assistance   Gait / Transfers Assistance Needed: Uses cane or RW for gait.  ADL's / Homemaking Assistance Needed: Daughter assists with meal prep.        Hand Dominance   Dominant Hand: Right    Extremity/Trunk Assessment   Upper Extremity Assessment: Generalized weakness (Changes in hand/finger position and function - MG)           Lower Extremity Assessment: Generalized weakness      Cervical / Trunk Assessment: Kyphotic (Head downward)  Communication  Communication: Expressive difficulties (Difficulty understanding patient)  Cognition Arousal/Alertness: Awake/alert Behavior During Therapy: WFL for tasks assessed/performed Overall Cognitive Status: Within Functional Limits for tasks assessed                       General Comments      Exercises     Assessment/Plan    PT Assessment Patient needs continued PT services  PT Problem List Decreased strength;Decreased balance;Decreased mobility;Decreased knowledge of use of DME;Cardiopulmonary status limiting activity          PT Treatment Interventions DME instruction;Gait training;Stair training;Functional mobility training;Therapeutic activities;Balance training;Patient/family education    PT Goals (Current goals can be found in the Care Plan section)  Acute Rehab PT Goals Patient Stated Goal: To return home PT Goal Formulation: With patient Time For Goal Achievement: 06/01/16 Potential to Achieve Goals: Good    Frequency Min 3X/week   Barriers to discharge Decreased caregiver support Patient alone most of day.    Co-evaluation               End of Session Equipment Utilized During Treatment: Gait belt Activity Tolerance: Patient tolerated treatment well Patient left: in bed;with call bell/phone within reach;with bed alarm set Nurse Communication: Mobility status         Time: [id number] PT Time Calculation (min) (ACUTE ONLY): 22 min   Charges:   PT Evaluation $PT Eval Moderate Complexity: 1 Procedure     PT G CodesDespina Pole 05-26-2016, 7:55 PM Carita Pian. Sanjuana Kava, [place] Pager [phone]

## 2016-05-25 NOTE — Progress Notes (Addendum)
Subjective: [name] is feeling great today. He is much less lethargic today. He denies any cough and confirms for [place] that he was dry heaving but did not vomit two evenings ago. He denies any cough or chills at this time. His daughters were at his bedside and they were all updated on the plan.   Objective:  Vital signs in last 24 hours: Vitals:   05/24/16 2100 05/24/16 2355 05/25/16 0416 05/25/16 1013  BP:  109/79 103/71 135/64  Pulse:  (!) 44 98   Resp:  17 16 16   Temp:  99 F (37.2 C) 98.1 F (36.7 C) 98.2 F (36.8 C)  TempSrc:  Oral Oral Oral  SpO2: 95% 94% 93% 95%  Weight:      Height:       Physical Exam  Cardiovascular: An irregularly irregular rhythm present.  Murmur heard. Pulmonary/Chest: Effort normal. He has no wheezes. He has no rales.  Abdominal: Soft. Bowel sounds are normal. There is no tenderness.  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 Redness and warmth to touch have improved from yesterday    Extremities: no calf tenderness, 1+ peripheral edema   Medications: Infusions:   Scheduled Medications: . azithromycin  500 mg Oral Daily  . cefTRIAXone (ROCEPHIN)  IV  1 g Intravenous Q24H  . metoprolol tartrate  25 mg Oral BID  . predniSONE  60 mg Oral Q breakfast  . Warfarin - Pharmacist Dosing Inpatient   Does not apply q1800   PRN Medications: acetaminophen **OR** acetaminophen, fluticasone, ondansetron (ZOFRAN) IV  Assessment/Plan: Pneumonia  Pt is a 82 y.o. yo male with a PMHx of myasthenia gravis, atrial fibrillation, HFrEF, prostate CA (s/p prostatectomy) who was admitted on 05/24/2016 with symptoms of lethargy and decreased appetite. On admission he had temp 100.3, low oxygen saturation which improved with nasal canula, and leukocytosis. Initial chest xray in the ED showed pleural calcifications which were unchanged from prior however repeat chest xray this morning showed lower lobe bilateral consolidation which were  suspicious for pneumonia Leukocytosis has improved today and clinically he looks much better. We will continue with ceftriaxone and azithromycin for CAP.  The left leg swelling and warmth to touch has improved from yesterday.  Urine cultures- no growth  Blood cultures- in process  Follow up repeat pro calcitonin   Hypokalemia  He had low potassium on admission which responded well to 2 runs of KCl 10 meq and Kdur 40 meq. This may be related to his lasix use. He has good kidney function so will prescribe K-DUR at discharge.  Follow up BMET   Atrial fibrillation  He is currently in afib with rates in the 100s. Home medications include metoprolol 25 mg BID, we have restarted this medication today.  For stroke prophylaxis he is on warfarin 5 mg M, W, F and 2.5 mg every other day. On admission he has supratheraputic INR 3.1. We have ordered warfarin through pharmacology consult. INR is improving.   Continue telemetry monitoring   CHF  Echo 05/2014 : EF 35-40%, grade IV mitral regurgitation.  Today  are clear to auscultation but he has 2+ pitting edema. Home medications include metoprolol 25 mg BID and Lasix 20 mg BID. Today we will restart Lasix at 20 mg once daily.   Myasthenia gravis  He is on prednisone 20 mg daily at home.  Ordered IV solu cortef 100 mg q 8 hours on admission, today we will decrease this to  prednisone 60 mg daily.   Dispo: Anticipated discharge in approximatey 1-2 day(s).   LOS: 1 day   Ledell Noss, MD 05/25/2016, 2:42 PM Pager: [phone]

## 2016-05-25 NOTE — Progress Notes (Signed)
Subjective: There were no acute events with Richard Bradley overnight. This morning, he reports feeling much better. He denies any cough, shortness of breath, or pain in his left leg. He reports that he has been using his nasal cannula on and off during his time on the ward, and spent the interview conversing with the team normally without oxygen. He was informed about changes seen on his repeat CXR and he and his daughters were amenable to staying an extra day in the hospital for observation followed by outpatient antibiotics if his condition continued to improve.   Objective: Vital signs in last 24 hours: Vitals:   05/24/16 2100 05/24/16 2355 05/25/16 0416 05/25/16 1013  BP:  109/79 103/71 135/64  Pulse:  (!) 44 98   Resp:  17 16 16   Temp:  99 F (37.2 C) 98.1 F (36.7 C) 98.2 F (36.8 C)  TempSrc:  Oral Oral Oral  SpO2: 95% 94% 93% 95%  Weight:      Height:       Weight change:   Intake/Output Summary (Last 24 hours) at 05/25/16 1202 Last data filed at 05/25/16 6294  Gross per 24 hour  Intake             2780 ml  Output              200 ml  Net             2580 ml   BP 135/64 (BP Location: Left Arm)   Pulse 98   Temp 98.2 F (36.8 C) (Oral)   Resp 16   Ht 6' 2"  (1.88 m)   Wt 81.1 kg (178 lb 12.7 oz)   SpO2 95%   BMI 22.96 kg/m   General Appearance:alert, cooperative, well-appearing, well-developed, well-nourished elderly man in no distress Head: Normocephalic, atraumatic Eyes: PERRLA, conjunctivae anicteric Cardiovascular: irregularly irregular rhythm, systolic murmur on exam Pulmonary: Lungs clear to auscultation bilaterally Abdominal: Soft, non-tender, non-distended MSK: 1+ pitting edema noted on bilateral lower extremities, diffuse MCP joint abnormalities with no warmth or erythema (per patient due to myasthenia gravis, has been evaluated by ortho) Neuro: Alert and orientated to person, place, and time Skin: ~42m wound at tip of left toe (identified by daughter as  site of osteomyelitis). 7 mm moist wound above DIP of same toe. Erythema and warmth of LLE have diminished significantly and pretibial edema has decreased, RLE cool and non-erythematous.  Lab Results: Sedimentation rate  Order: [id number] Status:  Final result Visible to patient:  No (Not Released) Next appt:  07/05/2016 at 11:30 AM in Cardiology (Sinclair Grooms MD)    Ref Range & Units 1d ago 167yrgo [id number]   Sed Rate 0 - 16 mm/hr 7  29   8R   Resulting Agency  [facility] [place] Specimen Collected: 05/24/16 21:15 Last Resulted: 05/24/16 23:36         Sedimentation rate  Order: [id number]:  Final result Visible to patient:  No (Not Released) Next appt:  07/05/2016 at 11:30 AM in Cardiology (HeSinclair GroomsD)    Ref Range & Units 1d ago 48yr88yr 64yr 648yr  Sed Rate 0 - 16 mm/hr 7  29   8R   Resulting Agency  [facility] Collected: 05/24/16 21:15 Last Resulted: 05/24/16 23:36         Influenza panel by PCR (type A & B, H1N1)  Order: [id number]  Status:  Final result Visible to patient:  No (Not Released) Next appt:  07/05/2016 at 11:30 AM in Cardiology Sinclair Grooms, MD)   Ref Range & Units 1d ago  Influenza A By PCR NEGATIVE NEGATIVE   Influenza B By PCR NEGATIVE NEGATIVE   H1N1 flu by pcr NOT DETECTED NOT DETECTED   Comments:     The Xpert Flu assay (FDA approved for  nasal aspirates or washes and  nasopharyngeal swab specimens), is  intended as an aid in the diagnosis of  influenza and should not be used as  a sole basis for treatment.   Resulting Agency  [facility]    Specimen Collected: 05/24/16 21:48 Last Resulted: 05/25/16 00:35         Protime-INR  Order: [id number]  Status:  Final result Visible to patient:  No (Not Released) Next appt:  07/05/2016 at 11:30 AM in Cardiology Sinclair Grooms, MD)    Ref Range & Units 04:22 (05/25/16) 1d ago (05/24/16) 1d ago (05/24/16)    Prothrombin Time 11.4 - 15.2 seconds 23.5   33.0   24.7     INR  2.05  3.14  2.19   Resulting Agency  [facility]    Specimen Collected: 05/25/16 04:22 Last Resulted: 05/25/16 04:51         CBC  Order: [id number]  Status:  Final result Visible to patient:  No (Not Released) Next appt:  07/05/2016 at 11:30 AM in Cardiology Sinclair Grooms, MD)    Ref Range & Units 04:22 1d ago 61moago   WBC 4.0 - 10.5 K/uL 9.9  13.3   11.3     RBC 4.22 - 5.81 MIL/uL 3.71   3.95   4.32    Hemoglobin 13.0 - 17.0 g/dL 12.1   13.0  14.1    HCT 39.0 - 52.0 % 37.9   39.6  44.6    MCV 78.0 - 100.0 fL 102.2   100.3   103.2     MCH 26.0 - 34.0 pg 32.6  32.9  32.6    MCHC 30.0 - 36.0 g/dL 31.9  32.8  31.6    RDW 11.5 - 15.5 % 14.0  14.1  14.4    Platelets 150 - 400 K/uL 151  159  167   Resulting Agency  [facility]    Specimen Collected: 05/25/16 04:22 Last Resulted: 05/25/16 04:37         Basic metabolic panel  Order: [id number] Status:  Final result Visible to patient:  No (Not Released) Next appt:  07/05/2016 at 11:30 AM in Cardiology (Sinclair Grooms MD)    Ref Range & Units 04:22 (05/25/16) 1d ago (05/24/16) 1d ago (05/24/16)   Sodium 135 - 145 mmol/L 141  141  139    Potassium 3.5 - 5.1 mmol/L 3.6  3.6CM  2.7CM     Chloride 101 - 111 mmol/L 106  107  104    CO2 22 - 32 mmol/L 26  28  25     Glucose, Bld 65 - 99 mg/dL 138   131   125     BUN 6 - 20 mg/dL 10  10  12     Creatinine, Ser 0.61 - 1.24 mg/dL 0.88  0.74  0.91    Calcium 8.9 - 10.3 mg/dL 7.4   7.4   8.1     GFR calc non Af Amer >60 mL/min >60  >60  >  60    GFR calc Af Amer >60 mL/min >60  >60CM  >60CM   Comments: (NOTE)  The eGFR has been calculated using the CKD EPI equation.  This calculation has not been validated in all clinical situations.  eGFR's persistently <60 mL/min signify possible Chronic Kidney  Disease.    Anion gap 5 - 15 9  6  10    Resulting Agency  [facility]      Specimen Collected: 05/25/16 04:22 Last Resulted: 05/25/16 04:57          Micro Results: Recent Results (from the past 240 hour(s))  Urine culture     Status: Abnormal   Collection Time: 05/24/16 11:15 AM  Result Value Ref Range Status   Specimen Description URINE, RANDOM  Final   Special Requests NONE  Final   Culture <10,000 COLONIES/mL INSIGNIFICANT GROWTH (A)  Final   Report Status 05/25/2016 FINAL  Final   Studies/Results: Dg Chest 2 View  Result Date: 05/25/2016 CLINICAL DATA:  Sepsis EXAM: CHEST  2 VIEW COMPARISON:  Yesterday FINDINGS: Cardiomegaly is stable. Bibasilar hazy opacity and small pleural effusions have increased. Calcifications in the upper lung zones are stable. Stable mid-level thoracic compression deformity. IMPRESSION: Increasing bibasilar hazy airspace disease and small pleural effusions. Electronically Signed   By: Marybelle Killings M.D.   On: 05/25/2016 09:24   Dg Chest 2 View  Result Date: 05/24/2016 CLINICAL DATA:  New oxygen requirement. Fever and vomiting all night. Concern for pneumonia. EXAM: CHEST  2 VIEW COMPARISON:  07/20/2015 and chest CT 11/22/2011 FINDINGS: Heart is enlarged. There is patchy density at the right lung base consistent with early infiltrate. Opacity at the left lung base partially obscures the hemidiaphragm and is consistent with atelectasis or early infiltrate. Suspect small left pleural effusion. Dense opacities overlie the lung apices and are consistent with pleural based calcifications as seen on prior exams. Prior vertebroplasty changes noted. IMPRESSION: 1. Cardiomegaly. 2. Increased bibasilar densities consistent atelectasis or early infiltrates. Small left effusion. Electronically Signed   By: Nolon Nations M.D.   On: 05/24/2016 11:41   Medications: I have reviewed the patient's current medications. Scheduled Meds: . azithromycin  500 mg Oral Daily  . cefTRIAXone (ROCEPHIN)  IV  1 g Intravenous Q24H  . hydrocortisone sod  succinate (SOLU-CORTEF) inj  100 mg Intravenous Q8H  . Warfarin - Pharmacist Dosing Inpatient   Does not apply q1800   Continuous Infusions:  PRN Meds:.acetaminophen **OR** acetaminophen, fluticasone, ondansetron (ZOFRAN) IV Assessment/Plan: Active Problems:   Sepsis (HCC)   CHF (congestive heart failure) ([place])  In summary, Mr. Agcaoili is an 82 y.o. Male with history of CHF, A-fib, osteomyelitis, and myasthenia gravis who presents with "dry heaving all night" and was found to have tachycardia, tachypnea, intermittent hypotension, leukocytosis, and a CXR concerning for possible pneumonia.   Sepsis: This patient's blood pressure and respiratory rate have normalized and his leukocytosis has resolved. His tachycardia remains, likely due to his underlying A-fib and the fact this his metoprolol rate control was held in the setting of intermittent hypotension yesterday. That patient no longer meets sepsis criteria by qSOFA or SIRS. While the LLE was originally considered the most likely source of infection, a repeat CXR this morning showed increasing bibasilar hazy airspace disease and small pleural effusions which could be consistent with underlying early community acquired pneumonia as the cause of the patient's presentation yesterday. Encouragingly, the patient's increased oxygen requirement has resolved and his white blood cell count have normalized.  We will add azithromycin and continue ceftriaxone to treat for likely CAP. Although initially appearing as possible cellulitis, the warmth and erythema of the LLE have almost completely resolved and the pretibial edema has decreased, making this diagnosis less likely. Similarly, osteomyelitis significant enough to cause septicemia seems unlikely given the normal ESR and mildly elevated CRP. Blood and urine cultures are pending. Flu test negative. We will continue to trend pro-calcitonin and observe the patient for another night. If the clinical situation  continues to improve, we will consider outpatient antibiotic therapy for CAP. - Ceftriaxone 1g daily IV to cover CAP - Added azithromycin 500 mg PO for CAP coverage  Myasthenia Gravis: Patient had myasthenic crisis with previous episode of sepsis, daughter reports he has been prophylactically treated with 2x normal prednisone dose on subsequent admissions. Patient denies eyelid weakness or dysphagia. Outpatient regimen is prednisone 20 mg daily. Minor illnesses can be treated by increasing the dose of corticosteroid to 2-3x daily dose for 3 days. In patients with potential septic shock, regimen is 200-300 mg of hydrocortisone in divided doses (50 mg q6hrs or 100 mg q8hrs). Patient's clinical condition has improved, will switch to 60 mg prednisone PO daily (3x outpatient dose, equivalent to 240 mg hydrocortisone). - Prednisone 60 mg PO daily, will taper as clinical condition improves.  CHF: 06/11/2014 echocardiogram shows EF 35-40%, grade IV mitral regurgitation, small pericardial effusion. Outpatient regimen is furosemide 20 mg BID. Patient denies increased dyspnea, lungs clear on exam, pitting edema 1+ bilateral lower extremities. Will monitor BP after restarting metoprolol tartrate and consider restarting diuretic tomorrow.  Atrial Fibrillation: INR supratherapeutic at 2.05. Currently in A-fib, no current bleeding, will continue pharmacy assisted  warfarin dosing to maintain adequate anti-coagulation and minimize bleeding risk. Will restart metoprolol tartrate 25 mg BID for rate control as blood pressure has normalized. - Pharmacist dosing of warfarin - Metoprolol tartrate 25 mg BID  Hypokalemia: 2.7 at 1100 today in ER, previously 3.8 on 8/23. Received 40 mEq tab PO in ER and 2 runs of KCl 10 mEq in 100 mL IV before repeat BMP showed correction of hypokalemia with potassium of 3.6. Patient's creatinine is 0.88 and estimated creatinine clearance 62.5 mL/min per last PharmD note. - Oral  potassium supplement prescription at discharge  Nausea/Dry Heaving: No further dry heaving post-Zofran in ER. Possibly related to underlying infectious process. - Ondansetron 4 mg IV PRN  F None E WNL N Heart healthy diet  DVT Ppx: Theraputic INR (2.05) Code StatusDNR, DNI, but central line and pressors are ok per discussion with patient and daughter Education officer, community)   Dispo: Estimated length of stay 1 more midnight  This is a Careers information officer Note.  The care of the patient was discussed with Dr. Hetty Ely and the assessment and plan formulated with their assistance.  Please see their attached note for official documentation of the daily encounter.   LOS: 1 day   Lawson Fiscal, Medical Student 05/25/2016, 12:02 PM

## 2016-05-26 LAB — PROTIME-INR
INR: 2.34
Prothrombin Time: 26.1 seconds — ABNORMAL HIGH (ref 11.4–15.2)

## 2016-05-26 LAB — CBC
HEMATOCRIT: 38.8 % — AB (ref 39.0–52.0)
HEMOGLOBIN: 12.6 g/dL — AB (ref 13.0–17.0)
MCH: 33.2 pg (ref 26.0–34.0)
MCHC: 32.5 g/dL (ref 30.0–36.0)
MCV: 102.1 fL — AB (ref 78.0–100.0)
Platelets: 169 10*3/uL (ref 150–400)
RBC: 3.8 MIL/uL — ABNORMAL LOW (ref 4.22–5.81)
RDW: 14.1 % (ref 11.5–15.5)
WBC: 12.3 10*3/uL — ABNORMAL HIGH (ref 4.0–10.5)

## 2016-05-26 LAB — BASIC METABOLIC PANEL
ANION GAP: 8 (ref 5–15)
BUN: 20 mg/dL (ref 6–20)
CHLORIDE: 105 mmol/L (ref 101–111)
CO2: 25 mmol/L (ref 22–32)
Calcium: 8.1 mg/dL — ABNORMAL LOW (ref 8.9–10.3)
Creatinine, Ser: 0.94 mg/dL (ref 0.61–1.24)
GFR calc Af Amer: >60 mL/min (ref >60)
GFR calc non Af Amer: >60 mL/min (ref >60)
GLUCOSE: 109 mg/dL — AB (ref 65–99)
POTASSIUM: 3.3 mmol/L — AB (ref 3.5–5.1)
Sodium: 138 mmol/L (ref 135–145)

## 2016-05-26 LAB — PROCALCITONIN: Procalcitonin: <0.1 ng/mL

## 2016-05-26 LAB — GLUCOSE, CAPILLARY
GLUCOSE-CAPILLARY: 124 mg/dL — AB (ref 65–99)
GLUCOSE-CAPILLARY: 216 mg/dL — AB (ref 65–99)

## 2016-05-26 MED ORDER — POTASSIUM CHLORIDE CRYS ER 20 MEQ PO TBCR: 20.0000 meq | EXTENDED_RELEASE_TABLET | Freq: Every day | ORAL | 0 refills | Status: DC

## 2016-05-26 MED ORDER — AZITHROMYCIN 500 MG PO TABS: 250.0000 mg | ORAL_TABLET | Freq: Every day | ORAL | Status: DC

## 2016-05-26 MED ORDER — POTASSIUM CHLORIDE CRYS ER 20 MEQ PO TBCR
40.0000 meq | EXTENDED_RELEASE_TABLET | Freq: Once | ORAL | Status: AC
  Administered 2016-05-26: 40 meq via ORAL
  Filled 2016-05-26: qty 2

## 2016-05-26 MED ORDER — AZITHROMYCIN 250 MG PO TABS: 250.0000 mg | ORAL_TABLET | Freq: Every day | ORAL | 0 refills | Status: DC

## 2016-05-26 MED ORDER — CEFUROXIME AXETIL 500 MG PO TABS: 250.0000 mg | ORAL_TABLET | Freq: Two times a day (BID) | ORAL | 0 refills | Status: DC

## 2016-05-26 MED ORDER — WARFARIN SODIUM 2.5 MG PO TABS: 2.5000 mg | ORAL_TABLET | Freq: Once | ORAL | Status: DC

## 2016-05-26 NOTE — Progress Notes (Signed)
ANTICOAGULATION CONSULT NOTE - Follow Up Consult  Pharmacy Consult for Warfarin Indication: atrial fibrillation  No Known Allergies  Patient Measurements: Height: 6\' 2"  (188 cm) Weight: 179 lb 0.2 oz (81.2 kg) IBW/kg (Calculated) : 82.2  Vital Signs: Temp: 98.3 F (36.8 C) (10/29 0956) Temp Source: Oral (10/29 0956) BP: 131/90 (10/29 0956) Pulse Rate: 117 (10/29 0956)  Labs:  Recent Labs  05/24/16 1100  05/24/16 1522 05/24/16 2115 05/25/16 0422 05/26/16 1027  HGB 13.0  --   --   --  12.1* 12.6*  HCT 39.6  --   --   --  37.9* 38.8*  PLT 159  --   --   --  151 169  LABPROT  --   < > 33.0*  --  23.5* 26.1*  INR  --   < > 3.14  --  2.05 2.34  CREATININE 0.91  --   --  0.74 0.88 0.94  < > = values in this interval not displayed.  Estimated Creatinine Clearance: 62.4 mL/min (by C-G formula based on SCr of 0.94 mg/dL).   Assessment: 51 YOM who continues on warfarin from PTA for hx Afib. INR today remains therapeutic (INR 2.34, goal of 2-3). Hgb/Hct low stable, plts wnl - no overt s/sx of bleeding noted.  The patient is on several medications that can increase warfarin sensitivity (Azithromycin, Ceftriaxone, prednisone) - will monitor INR.   Goal of Therapy:  INR 2-3   Plan:  1. Warfarin 2.5mg  x1 2. Will continue to monitor for any signs/symptoms of bleeding and will follow up with PT/INR in the a.m.   Thank you for allowing pharmacy to be a part of this patient's care.  Georga Bora, PharmD Clinical Pharmacist Pager: [phone] 05/26/2016 1:50 PM

## 2016-05-26 NOTE — Progress Notes (Addendum)
   Subjective:  Feeling much better this morning. Reports the steroids kept him up most of the night. No sob, cough, nausea, vomiting. No fevers or chills. Tells me he would like to go home today. No requiring any supplemental O2.  Objective:  Vital signs in last 24 hours: Vitals:   05/25/16 1700 05/25/16 2058 05/26/16 0016 05/26/16 0434  BP: 99/74 (!) 112/92 120/85 119/88  Pulse: (!) 104 100 100 (!) 114  Resp: 20 19 20 19   Temp: 98 F (36.7 C) 97.4 F (36.3 C) 98.5 F (36.9 C) 97.5 F (36.4 C)  TempSrc: Oral Oral Oral Oral  SpO2: 90% 93% 95% 90%  Weight:  179 lb 0.2 oz (81.2 kg)    Height:       Physical Exam  Cardiovascular: An irregularly irregular rhythm present.  Murmur heard. Pulmonary/Chest: Effort normal. He has no wheezes. He has no rales. Left basilar crackles. Otherwise CTAB.  Abdominal: Soft. Bowel sounds are normal. There is no tenderness.  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 Redness and warmth to touch have improved from yesterday   Extremities: no calf tenderness, 1+ peripheral edema   Assessment/Plan:  Pneumonia  No further fevers or chills. Not requiring any supplemental O2. No cough or sputum production. Clinically improved and stable for discharge. Labs still pending this morning. Will change to PO ABX today at discharge to complete a 7 day course and finish 5 day course of Azithromycin. Urine culture with insignificant growth. Blood cultures with NG x 2 days. LLE with improved erythema and warmth, low suspicion for the source of his infection at time of presentation. PT with no home health needs.  -Change to PO ABX at discharge -Repeat XRay in 4-6 weeks to ensure resolution  Hypokalemia  K 2.7 at admission. Improved after replacement. Could be 2/2 to his chronic lasix use. Repeat labs still pending this morning. Will discharge on KDur 20 mEq daily with close follow up outpatient.   Atrial fibrillation  HR 110-114 overnight.  Metoprolol 25 mg bid. INR today pending but has been therapeutic. Will continue home meds.   CHF  Echo 05/2014 : EF 35-40%, grade IV mitral regurgitation.  Edema improved today after restarting lasix last night. Home medications include metoprolol 25 mg BID and Lasix 20 mg BID. Continue Lasix at 20 mg once daily.   Myasthenia gravis  He is on prednisone 20 mg daily at home. Got stress dose steroids on admission. Transitioned to prednisone 60 mg daily today. Will give short taper at discharge.   Macrocytic Anemia Mild anemia. MCV 100-103. Checking B12 and Folate.   Dispo: Home today  Maryellen Pile, MD 05/26/2016, 7:21 AM Pager: [phone]

## 2016-05-26 NOTE — Discharge Summary (Signed)
Name: Richard Bradley MRN: [id number] DOB: [birth year] 82 y.o. PCP: Maisie Fus, MD  Date of Admission: 05/24/2016 10:27 AM Date of Discharge: 05/24/2016  Attending Physician: Dr. 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   cefUROXime 500 MG tablet Commonly known as:  CEFTIN Take 0.5 tablets (250 mg total) by mouth 2 (two) times daily with a meal.   FIBER PO Take 1 tablet by mouth 4 (four) times daily.   fluticasone 50 MCG/ACT nasal spray Commonly known as:  FLONASE Place 2 sprays into both nostrils daily as needed for allergies or rhinitis. What changed:  when to take this   furosemide 20 MG tablet Commonly known as:  LASIX Take 1 tablet (20 mg total) by mouth 2 (two) times daily. What changed:  when to take this   HYDROcodone-acetaminophen 10-325 MG tablet Commonly known as:  NORCO Take 1 tablet by mouth every 5 (five) hours as needed for moderate pain.   metoprolol tartrate 25 MG tablet Commonly known as:  LOPRESSOR Take 25 mg by mouth 2 (two) times daily.   potassium chloride SA 20 MEQ tablet Commonly known as:  K-DUR,KLOR-CON Take 1 tablet (20 mEq total) by mouth daily.   predniSONE 10 MG tablet Commonly known as:  DELTASONE Take 2 tablets (20 mg total) by mouth daily.   vitamin B-12 1000 MCG tablet Commonly known as:  CYANOCOBALAMIN Take 1,000 mcg by mouth daily.   VITAMIN D-3 PO Take 2,000 Units by mouth daily.   warfarin 5 MG tablet Commonly known as:  COUMADIN Take 2.5-5 mg by mouth daily. M,W, F trake 1 whole tab. All other days  1/2 tab       Disposition and follow-up:   [name] was discharged from [facility] in Good condition.  At the hospital follow up visit please address:  1.  Anemia acquired pneumonia- Has he completed the antibiotic course?   2.  Labs / imaging needed at time of follow-up: Repeat chest xray 4-6 weeks after hospitalization,  BMET (hypokalemia on admission)   3.  Pending labs/ test needing follow-up: none   Follow-up Appointments: Follow-up Information    [place]. Schedule an appointment as soon as possible for a visit in 1 week(s).   Contact information: 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 is a 82 y.o. male with a PMH of myasthenia gravis, atrial fibrillation, HFrEF, prostate CA (s/p prostatectomy), osteomyelitis (s/p left great toe amputation, currently being treated with doxycycline for an infection in his second toe) who presents with weakness, lethargy, and nausea. In the ED he was found to be febrile 100.3, in atrial fibrillation with HR  100s. Labs showed K 2.7, WBC 13.3, Calcitonin 0.12, lactic acid 1.75. Chest xray showed bilateral lower lobe consolidation and he was started on ceftriaxone and azithromycin for community-acquired pneumonia. On the day of discharge he was feeling much better and had been afebrile for over 48 hours he reported no cough shortness of breath nausea or chills. He was transitioned to oral cefuroxime to complete a seven-day course and azithromycin for a 5 day course.  Hypokalemia  His potassium was 2.7 on admission and reported improved  after replacement. This was thought to be secondary to his chronic Lasix use. He was discharged with a prescription for K-Dur 20 mEq daily.    Macrocytic anemia He was found to have mild macrocytic anemia with MCV 100. B12 and folate were within normal range.  Discharge Vitals:   BP 131/90 (BP Location: Right Arm)   Pulse (!) 117   Temp 98.3 F (36.8 C) (Oral)   Resp 20   Ht 6\' 2"  (1.88 m)   Wt 179 lb 0.2 oz (81.2 kg)   SpO2 90%   BMI 22.98 kg/m    Discharge Instructions: Discharge Instructions    Call MD for:  difficulty breathing, headache or visual disturbances    Complete by:  As directed    Call MD for:  extreme fatigue    Complete by:  As directed    Call MD for:  temperature >100.4    Complete by:  As directed    Diet - low sodium heart healthy    Complete by:  As directed    Increase activity slowly    Complete by:  As directed       Signed: Ledell Noss, MD 05/30/2016, 7:35 AM   Pager: [phone]

## 2016-05-27 LAB — FOLATE RBC
FOLATE, HEMOLYSATE: 315.5 ng/mL
FOLATE, RBC: 867 ng/mL (ref >498)
HEMATOCRIT: 36.4 % — AB (ref 37.5–51.0)

## 2016-05-29 LAB — CULTURE, BLOOD (ROUTINE X 2)
Culture: NO GROWTH
Culture: NO GROWTH

## 2016-05-29 NOTE — ED Notes (Signed)
Blood cultures obtained by Ubaldo Glassing RN during second IV stick. This RN starting rocephin through original IV simultaneously. Phlebotomy at bedside to obtain second set of cultures.

## 2016-06-05 ENCOUNTER — Ambulatory Visit (INDEPENDENT_AMBULATORY_CARE_PROVIDER_SITE_OTHER): Payer: Medicare Other | Admitting: Orthopedic Surgery

## 2016-06-05 ENCOUNTER — Other Ambulatory Visit (INDEPENDENT_AMBULATORY_CARE_PROVIDER_SITE_OTHER): Payer: Self-pay | Admitting: Family

## 2016-06-05 ENCOUNTER — Encounter (INDEPENDENT_AMBULATORY_CARE_PROVIDER_SITE_OTHER): Payer: Self-pay | Admitting: Orthopedic Surgery

## 2016-06-05 VITALS — Ht 74.0 in | Wt 179.0 lb

## 2016-06-05 DIAGNOSIS — B351 Tinea unguium: Secondary | ICD-10-CM

## 2016-06-05 DIAGNOSIS — M86172 Other acute osteomyelitis, left ankle and foot: Secondary | ICD-10-CM

## 2016-06-05 MED ORDER — SULFAMETHOXAZOLE-TRIMETHOPRIM 800-160 MG PO TABS: 1.0000 | ORAL_TABLET | Freq: Two times a day (BID) | ORAL | 0 refills | Status: DC

## 2016-06-05 NOTE — Progress Notes (Signed)
Wound Care Note   Patient: Richard Bradley           Date of Birth: 29-Jul-1926           MRN: [id number]             PCP: Maisie Fus, MD Visit Date: 06/05/2016   Assessment & Plan: Visit Diagnoses:  1. Other acute osteomyelitis of left foot ([place])   2. Onychomycosis     Plan: We will get him set up for amputation of the left second toe him for next week. He will follow up one week postop.  Follow-Up Instructions: No Follow-up on file.  Orders:  No orders of the defined types were placed in this encounter.  Meds ordered this encounter  Medications  . sulfamethoxazole-trimethoprim (BACTRIM DS,SEPTRA DS) 800-160 MG tablet    Sig: Take 1 tablet by mouth 2 (two) times daily.    Dispense:  20 tablet    Refill:  0      Procedures: No notes on file   Clinical Data: No additional findings.   No images are attached to the encounter.   Subjective: Chief Complaint  Patient presents with  . Right Foot - Nail Problem    Nail trim  . Left Foot - Nail Problem, Wound Check    Left great toe amputation at MTP joint. Left 2nd toe ulceration and nail trim.    Patient presents for bilateral lower extremity evaluation. He has onychomycotic nails and would like a nail trim bilaterally. He is status post left great toe amputation at MTP joint 03/20/16. He is doing well with this. He also presents today for 2nd toe ulceration. He is applying silvadene dressing changes. There is redness, swelling and maceration with fibrinous exudate. There is no odor. He has large purple skin discoloration right lower leg he does not recall any injury.    Review of Systems  Constitutional: Negative for chills and fever.  Skin: Positive for wound.    Objective: Vital Signs: Ht 6\' 2"  (1.88 m)   Wt 179 lb (81.2 kg)   BMI 22.98 kg/m   Physical Exam: Physical Exam  Constitutional: He is oriented to person, place, and time. He appears well-developed and well-nourished.  Pulmonary/Chest:  Effort normal.  Neurological: He is alert and oriented to person, place, and time.  Psychiatric: He has a normal mood and affect.  Nursing note reviewed.  Onychomycotic nails times 8. Patient is unable to safely trim the nails. These were trimmed. Left foot second toe with dorsal ulceration that is 6 mm in diameter this wound has exposed bone. Distal tip of second toe also with ulceration that probes to bone. There is little drainage. No purulence no cellulitis and no sign of acsending infection.  Specialty Comments: No specialty comments available.   PMFS History: Patient Active Problem List   Diagnosis Date Noted  . Pyogenic inflammation of bone ([place]) 06/05/2016  . Onychomycosis 06/05/2016  . CAP (community acquired pneumonia) 05/25/2016  . Hypokalemia 05/25/2016  . Sepsis ([place]) 05/24/2016  . Chronic diastolic congestive heart failure ([place])   . Insomnia 08/12/2014  . Encounter for therapeutic drug monitoring 10/26/2013  . 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 Seasonal allergic rhinitis 05/26/2012  . Myasthenia gravis ([place]) 06/27/2011  . Prostate cancer ([place])   . Atrial fibrillation ([place])   . Osteoarthritis    Past Medical History:  Diagnosis Date  . Atrial fibrillation ([place])   . Cellulitis    left arm  .  CHF (congestive heart failure) ([place])   . Dysrhythmia    hx of atrial fibrilation  . GERD (gastroesophageal reflux disease)    ocassional  . History of kidney stones   . Myasthenia gravis   . Osteoarthritis   . Prostate cancer ([place]) dx'd 1982  . Sepsis ([place])   . Shortness of breath dyspnea    with exertion  . Urine incontinence   . Varicose veins of left lower extremity     Family History  Problem Relation Age of Onset  . Heart disease Father   . Heart disease Brother   . Heart disease Paternal Grandfather   . Stroke Mother   . Parkinsonism Sister   . Heart disease Sister   . Dementia Sister    Past Surgical History:  Procedure Laterality Date  .  AMPUTATION Left 03/20/2016   Procedure: Left Great Toe Amputation at Metatarsophalangeal Joint;  Surgeon: Newt Minion, MD;  Location: [facility];  Service: Orthopedics;  Laterality: Left;  . appendectomy    . APPENDECTOMY    . COLONOSCOPY    . HERNIA REPAIR Bilateral    Inguinal  . I&D EXTREMITY Left 10/12/2014   Procedure: IRRIGATION AND DEBRIDEMENT INDEX FINGER;  Surgeon: Iran Planas, MD;  Location: [facility];  Service: Orthopedics;  Laterality: Left;  . KYPHOPLASTY  2012 ?   T 12- L1  . LAMINECTOMY  2005 ?   lumbar- 4-5  . PROSTATE SURGERY    . ROTATOR CUFF REPAIR Right   . TOTAL KNEE ARTHROPLASTY Right    Social History   Occupational History  . Retired [employer]   Social History Main Topics  . Smoking status: Never Smoker  . Smokeless tobacco: Never Used  . Alcohol use 2.4 - 3.0 oz/week    4 - 5 Cans of beer per week  . Drug use: No  . Sexual activity: Not on file     Comment: Widowed

## 2016-06-06 ENCOUNTER — Other Ambulatory Visit (INDEPENDENT_AMBULATORY_CARE_PROVIDER_SITE_OTHER): Payer: Self-pay | Admitting: Orthopedic Surgery

## 2016-06-06 DIAGNOSIS — M86272 Subacute osteomyelitis, left ankle and foot: Secondary | ICD-10-CM

## 2016-06-10 ENCOUNTER — Encounter (HOSPITAL_COMMUNITY): Payer: Self-pay | Admitting: *Deleted

## 2016-06-10 MED ORDER — CEFAZOLIN SODIUM-DEXTROSE 2-4 GM/100ML-% IV SOLN
2.0000 g | INTRAVENOUS | Status: AC
  Administered 2016-06-11: 2 g via INTRAVENOUS
  Filled 2016-06-10: qty 100

## 2016-06-10 NOTE — Progress Notes (Signed)
Spoke with pt's daughter, Robbi Gonzalo for pre-op call. Pt has hx of A-fib and is on Coumadin, which was not stopped, but dosage has been lowered since he is on an antibiotic. [name] states pt is going today for PT-INR. She denies any recent chest pain. Pt does have sob with exertion.

## 2016-06-11 ENCOUNTER — Ambulatory Visit (HOSPITAL_COMMUNITY)
Admission: RE | Admit: 2016-06-11 | Discharge: 2016-06-11 | Disposition: A | Discharge status: Home / Self Care | Payer: Medicare Other | Source: Ambulatory Visit | Attending: Orthopedic Surgery | Admitting: Orthopedic Surgery

## 2016-06-11 ENCOUNTER — Encounter (HOSPITAL_COMMUNITY)
Admission: RE | Disposition: A | Discharge status: Home / Self Care | Payer: Self-pay | Source: Ambulatory Visit | Attending: Orthopedic Surgery

## 2016-06-11 ENCOUNTER — Ambulatory Visit (HOSPITAL_COMMUNITY): Payer: Medicare Other | Admitting: Certified Registered Nurse Anesthetist

## 2016-06-11 ENCOUNTER — Encounter (HOSPITAL_COMMUNITY): Payer: Self-pay | Admitting: Certified Registered Nurse Anesthetist

## 2016-06-11 ENCOUNTER — Ambulatory Visit: Payer: Medicare Other

## 2016-06-11 DIAGNOSIS — G7000 Myasthenia gravis without (acute) exacerbation: Secondary | ICD-10-CM | POA: Diagnosis not present

## 2016-06-11 DIAGNOSIS — Z7952 Long term (current) use of systemic steroids: Secondary | ICD-10-CM | POA: Insufficient documentation

## 2016-06-11 DIAGNOSIS — M869 Osteomyelitis, unspecified: Secondary | ICD-10-CM | POA: Diagnosis not present

## 2016-06-11 DIAGNOSIS — K219 Gastro-esophageal reflux disease without esophagitis: Secondary | ICD-10-CM | POA: Insufficient documentation

## 2016-06-11 DIAGNOSIS — I509 Heart failure, unspecified: Secondary | ICD-10-CM | POA: Insufficient documentation

## 2016-06-11 DIAGNOSIS — M1990 Unspecified osteoarthritis, unspecified site: Secondary | ICD-10-CM | POA: Insufficient documentation

## 2016-06-11 DIAGNOSIS — Z7901 Long term (current) use of anticoagulants: Secondary | ICD-10-CM | POA: Insufficient documentation

## 2016-06-11 DIAGNOSIS — Z89412 Acquired absence of left great toe: Secondary | ICD-10-CM | POA: Insufficient documentation

## 2016-06-11 DIAGNOSIS — I110 Hypertensive heart disease with heart failure: Secondary | ICD-10-CM | POA: Diagnosis not present

## 2016-06-11 DIAGNOSIS — Z79891 Long term (current) use of opiate analgesic: Secondary | ICD-10-CM | POA: Diagnosis not present

## 2016-06-11 DIAGNOSIS — L97529 Non-pressure chronic ulcer of other part of left foot with unspecified severity: Secondary | ICD-10-CM | POA: Insufficient documentation

## 2016-06-11 DIAGNOSIS — M86272 Subacute osteomyelitis, left ankle and foot: Secondary | ICD-10-CM

## 2016-06-11 DIAGNOSIS — I4891 Unspecified atrial fibrillation: Secondary | ICD-10-CM | POA: Insufficient documentation

## 2016-06-11 DIAGNOSIS — Z7951 Long term (current) use of inhaled steroids: Secondary | ICD-10-CM | POA: Insufficient documentation

## 2016-06-11 DIAGNOSIS — I739 Peripheral vascular disease, unspecified: Secondary | ICD-10-CM | POA: Insufficient documentation

## 2016-06-11 DIAGNOSIS — Z79899 Other long term (current) drug therapy: Secondary | ICD-10-CM | POA: Insufficient documentation

## 2016-06-11 DIAGNOSIS — Z96651 Presence of right artificial knee joint: Secondary | ICD-10-CM | POA: Insufficient documentation

## 2016-06-11 HISTORY — DX: Pneumonia, unspecified organism: J18.9

## 2016-06-11 HISTORY — PX: AMPUTATION: SHX166

## 2016-06-11 LAB — CBC
HCT: 39.4 % (ref 39.0–52.0)
Hemoglobin: 12.6 g/dL — ABNORMAL LOW (ref 13.0–17.0)
MCH: 32.6 pg (ref 26.0–34.0)
MCHC: 32 g/dL (ref 30.0–36.0)
MCV: 101.8 fL — AB (ref 78.0–100.0)
PLATELETS: 159 10*3/uL (ref 150–400)
RBC: 3.87 MIL/uL — ABNORMAL LOW (ref 4.22–5.81)
RDW: 14.5 % (ref 11.5–15.5)
WBC: 8.8 10*3/uL (ref 4.0–10.5)

## 2016-06-11 LAB — COMPREHENSIVE METABOLIC PANEL
ALBUMIN: 3.5 g/dL (ref 3.5–5.0)
ALT: 18 U/L (ref 17–63)
ANION GAP: 9 (ref 5–15)
AST: 23 U/L (ref 15–41)
Alkaline Phosphatase: 43 U/L (ref 38–126)
BUN: 22 mg/dL — AB (ref 6–20)
CHLORIDE: 104 mmol/L (ref 101–111)
CO2: 26 mmol/L (ref 22–32)
Calcium: 8.6 mg/dL — ABNORMAL LOW (ref 8.9–10.3)
Creatinine, Ser: 1.14 mg/dL (ref 0.61–1.24)
GFR calc Af Amer: >60 mL/min (ref >60)
GFR, EST NON AFRICAN AMERICAN: 55 mL/min — AB (ref >60)
Glucose, Bld: 102 mg/dL — ABNORMAL HIGH (ref 65–99)
POTASSIUM: 3.7 mmol/L (ref 3.5–5.1)
Sodium: 139 mmol/L (ref 135–145)
Total Bilirubin: 1.5 mg/dL — ABNORMAL HIGH (ref 0.3–1.2)
Total Protein: 5.5 g/dL — ABNORMAL LOW (ref 6.5–8.1)

## 2016-06-11 LAB — PROTIME-INR
INR: 1.81
PROTHROMBIN TIME: 21.2 s — AB (ref 11.4–15.2)

## 2016-06-11 LAB — APTT: APTT: 31 s (ref 24–36)

## 2016-06-11 SURGERY — AMPUTATION DIGIT
Anesthesia: Regional | Site: Toe | Laterality: Left

## 2016-06-11 MED ORDER — 0.9 % SODIUM CHLORIDE (POUR BTL) OPTIME
TOPICAL | Status: DC | PRN
  Administered 2016-06-11: 1000 mL

## 2016-06-11 MED ORDER — BUPIVACAINE HCL (PF) 0.5 % IJ SOLN
INTRAMUSCULAR | Status: DC | PRN
  Administered 2016-06-11: 40 mL via PERINEURAL

## 2016-06-11 MED ORDER — MIDAZOLAM HCL 2 MG/2ML IJ SOLN: 0.5000 mg | Freq: Once | INTRAMUSCULAR | Status: DC | PRN

## 2016-06-11 MED ORDER — MEPERIDINE HCL 25 MG/ML IJ SOLN: 6.2500 mg | INTRAMUSCULAR | Status: DC | PRN

## 2016-06-11 MED ORDER — FENTANYL CITRATE (PF) 100 MCG/2ML IJ SOLN: 25.0000 ug | INTRAMUSCULAR | Status: DC | PRN

## 2016-06-11 MED ORDER — PROPOFOL 10 MG/ML IV BOLUS
INTRAVENOUS | Status: DC | PRN
  Administered 2016-06-11: 20 mg via INTRAVENOUS

## 2016-06-11 MED ORDER — MIDAZOLAM HCL 2 MG/2ML IJ SOLN
INTRAMUSCULAR | Status: AC
  Filled 2016-06-11: qty 2

## 2016-06-11 MED ORDER — FENTANYL CITRATE (PF) 100 MCG/2ML IJ SOLN
INTRAMUSCULAR | Status: AC
  Administered 2016-06-11: 50 ug
  Filled 2016-06-11: qty 2

## 2016-06-11 MED ORDER — PROMETHAZINE HCL 25 MG/ML IJ SOLN: 6.2500 mg | INTRAMUSCULAR | Status: DC | PRN

## 2016-06-11 MED ORDER — LACTATED RINGERS IV SOLN
INTRAVENOUS | Status: DC
  Administered 2016-06-11 (×2): via INTRAVENOUS

## 2016-06-11 MED ORDER — CHLORHEXIDINE GLUCONATE 4 % EX LIQD
60.0000 mL | Freq: Once | CUTANEOUS | Status: DC
Start: 2016-06-11 — End: 2016-06-11

## 2016-06-11 SURGICAL SUPPLY — 33 items
BLADE SURG 21 STRL SS (BLADE) ×3 IMPLANT
BNDG COHESIVE 4X5 TAN STRL (GAUZE/BANDAGES/DRESSINGS) ×3 IMPLANT
BNDG ESMARK 4X9 LF (GAUZE/BANDAGES/DRESSINGS) ×3 IMPLANT
BNDG GAUZE ELAST 4 BULKY (GAUZE/BANDAGES/DRESSINGS) ×3 IMPLANT
COVER SURGICAL LIGHT HANDLE (MISCELLANEOUS) ×3 IMPLANT
DRAPE U-SHAPE 47X51 STRL (DRAPES) ×3 IMPLANT
DRSG ADAPTIC 3X8 NADH LF (GAUZE/BANDAGES/DRESSINGS) ×3 IMPLANT
DRSG PAD ABDOMINAL 8X10 ST (GAUZE/BANDAGES/DRESSINGS) IMPLANT
DURAPREP 26ML APPLICATOR (WOUND CARE) ×3 IMPLANT
ELECT REM PT RETURN 9FT ADLT (ELECTROSURGICAL) ×3
ELECTRODE REM PT RTRN 9FT ADLT (ELECTROSURGICAL) ×1 IMPLANT
GAUZE SPONGE 4X4 12PLY STRL (GAUZE/BANDAGES/DRESSINGS) ×3 IMPLANT
GLOVE BIOGEL PI IND STRL 9 (GLOVE) ×1 IMPLANT
GLOVE BIOGEL PI INDICATOR 9 (GLOVE) ×2
GLOVE SURG ORTHO 9.0 STRL STRW (GLOVE) ×3 IMPLANT
GOWN STRL REUS W/ TWL LRG LVL3 (GOWN DISPOSABLE) ×1 IMPLANT
GOWN STRL REUS W/ TWL XL LVL3 (GOWN DISPOSABLE) ×2 IMPLANT
GOWN STRL REUS W/TWL LRG LVL3 (GOWN DISPOSABLE) ×2
GOWN STRL REUS W/TWL XL LVL3 (GOWN DISPOSABLE) ×4
KIT BASIN OR (CUSTOM PROCEDURE TRAY) ×3 IMPLANT
KIT ROOM TURNOVER OR (KITS) ×3 IMPLANT
MANIFOLD NEPTUNE II (INSTRUMENTS) IMPLANT
NEEDLE 22X1 1/2 (OR ONLY) (NEEDLE) IMPLANT
NS IRRIG 1000ML POUR BTL (IV SOLUTION) ×3 IMPLANT
PACK ORTHO EXTREMITY (CUSTOM PROCEDURE TRAY) ×3 IMPLANT
PAD ARMBOARD 7.5X6 YLW CONV (MISCELLANEOUS) ×3 IMPLANT
SPONGE GAUZE 4X4 12PLY STER LF (GAUZE/BANDAGES/DRESSINGS) ×3 IMPLANT
SUCTION FRAZIER HANDLE 10FR (MISCELLANEOUS) ×2
SUCTION TUBE FRAZIER 10FR DISP (MISCELLANEOUS) ×1 IMPLANT
SUT ETHILON 2 0 PSLX (SUTURE) ×3 IMPLANT
SYR CONTROL 10ML LL (SYRINGE) IMPLANT
TOWEL OR 17X24 6PK STRL BLUE (TOWEL DISPOSABLE) ×3 IMPLANT
TOWEL OR 17X26 10 PK STRL BLUE (TOWEL DISPOSABLE) ×3 IMPLANT

## 2016-06-11 NOTE — Anesthesia Preprocedure Evaluation (Addendum)
Anesthesia Evaluation  Patient identified by MRN, date of birth, ID band Patient awake    Reviewed: Allergy & Precautions, NPO status , Patient's Chart, lab work & pertinent test results, reviewed documented beta blocker date and time   History of Anesthesia Complications Negative for: history of anesthetic complications  Airway Mallampati: III  TM Distance: >3 FB   Mouth opening: Limited Mouth Opening Comment: Ankylosing spondylosis Dental  (+) Dental Advisory Given, Missing   Pulmonary neg pulmonary ROS,    breath sounds clear to auscultation       Cardiovascular hypertension, Pt. on home beta blockers and Pt. on medications (-) angina+ Peripheral Vascular Disease  + dysrhythmias Atrial Fibrillation  Rhythm:Irregular Rate:Normal  '13 ECHO: EF 50%, mild AI, mod MR   Neuro/Psych myasthenia gravis-controlled with prednisone    GI/Hepatic Neg liver ROS, GERD  Medicated and Controlled,  Endo/Other  negative endocrine ROS  Renal/GU negative Renal ROS     Musculoskeletal  (+) Arthritis , Osteoarthritis and steroids,    Abdominal   Peds  Hematology Coumadin   Anesthesia Other Findings   Reproductive/Obstetrics                            Anesthesia Physical Anesthesia Plan  ASA: III  Anesthesia Plan: Regional   Post-op Pain Management:    Induction:   Airway Management Planned: Natural Airway and Nasal Cannula  Additional Equipment:   Intra-op Plan:   Post-operative Plan:   Informed Consent: I have reviewed the patients History and Physical, chart, labs and discussed the procedure including the risks, benefits and alternatives for the proposed anesthesia with the patient or authorized representative who has indicated his/her understanding and acceptance.   Dental advisory given  Plan Discussed with: CRNA and Surgeon  Anesthesia Plan Comments: (Plan routine monitors, ankle block)         Anesthesia Quick Evaluation

## 2016-06-11 NOTE — Anesthesia Postprocedure Evaluation (Signed)
Anesthesia Post Note  Patient: Richard Bradley  Procedure(s) Performed: Procedure(s) (LRB): Left 2nd Toe Amputation (Left)  Patient location during evaluation: PACU Anesthesia Type: Regional and MAC Level of consciousness: awake and alert, oriented and patient cooperative Pain management: pain level controlled Vital Signs Assessment: post-procedure vital signs reviewed and stable Respiratory status: spontaneous breathing, nonlabored ventilation and respiratory function stable Cardiovascular status: blood pressure returned to baseline and stable Postop Assessment: no signs of nausea or vomiting Anesthetic complications: no    Last Vitals:  Vitals:   06/11/16 1155 06/11/16 1206  BP: 113/83 104/77  Pulse: 98 88  Resp: (!) 21 20  Temp: 36.7 C     Last Pain:  Vitals:   06/11/16 0921  TempSrc: Oral                 Caron Tardif,E. Asahd Can

## 2016-06-11 NOTE — Transfer of Care (Signed)
Immediate Anesthesia Transfer of Care Note  Patient: Richard Bradley  Procedure(s) Performed: Procedure(s): Left 2nd Toe Amputation (Left)  Patient Location: PACU  Anesthesia Type:MAC combined with regional for post-op pain  Level of Consciousness: awake, alert , oriented and patient cooperative  Airway & Oxygen Therapy: Patient Spontanous Breathing and Patient connected to nasal cannula oxygen  Post-op Assessment: Report given to RN and Post -op Vital signs reviewed and stable  Post vital signs: Reviewed and stable  Last Vitals:  Vitals:   06/11/16 0921 06/11/16 1030  BP: 94/72   Pulse: (!) 57 (!) 43  Resp: 18 16  Temp: 36.8 C     Last Pain:  Vitals:   06/11/16 0921  TempSrc: Oral      Patients Stated Pain Goal: 3 ([id number])  Complications: No apparent anesthesia complications

## 2016-06-11 NOTE — H&P (Signed)
Richard Bradley is an 82 y.o. male.   Chief Complaint: Osteomyelitis ulceration left foot second toe HPI: Patient is an 82 year old gentleman with peripheral vascular disease status post amputation of the left foot great toe who presents at this time with progressive ulceration osteomyelitis cellulitis of the second toe left foot.  Past Medical History:  Diagnosis Date  . Atrial fibrillation ([place])   . Cellulitis    left arm  . CHF (congestive heart failure) ([place])   . Constipation   . Dysrhythmia    hx of atrial fibrilation  . GERD (gastroesophageal reflux disease)    ocassional  . History of kidney stones   . Myasthenia gravis   . Osteoarthritis   . Pneumonia   . Prostate cancer ([place]) dx'd 1982  . Sepsis ([place])   . Shortness of breath dyspnea    with exertion  . Urine incontinence   . Varicose veins of left lower extremity     Past Surgical History:  Procedure Laterality Date  . AMPUTATION Left 03/20/2016   Procedure: Left Great Toe Amputation at Metatarsophalangeal Joint;  Surgeon: Newt Minion, MD;  Location: [facility];  Service: Orthopedics;  Laterality: Left;  . appendectomy    . APPENDECTOMY    . COLONOSCOPY    . HERNIA REPAIR Bilateral    Inguinal  . I&D EXTREMITY Left 10/12/2014   Procedure: IRRIGATION AND DEBRIDEMENT INDEX FINGER;  Surgeon: Iran Planas, MD;  Location: [facility];  Service: Orthopedics;  Laterality: Left;  . KYPHOPLASTY  2012 ?   T 12- L1  . LAMINECTOMY  2005 ?   lumbar- 4-5  . PROSTATE SURGERY    . ROTATOR CUFF REPAIR Right   . TOTAL KNEE ARTHROPLASTY Right     Family History  Problem Relation Age of Onset  . Heart disease Father   . Heart disease Brother   . Heart disease Paternal Grandfather   . Stroke Mother   . Parkinsonism Sister   . Heart disease Sister   . Dementia Sister    Social History:  reports that he has never smoked. He has never used smokeless tobacco. He reports that he drinks about 2.4 - 3.0 oz of alcohol per week . He reports  that he does not use drugs.  Allergies: No Known Allergies  Medications Prior to Admission  Medication Sig Dispense Refill  . Cholecalciferol (VITAMIN D-3 PO) Take 2,000 Units by mouth daily.     Marland Kitchen FIBER PO Take 1 tablet by mouth 3 (three) times daily.     . fluticasone (FLONASE) 50 MCG/ACT nasal spray Place 2 sprays into both nostrils daily as needed for allergies or rhinitis. (Patient taking differently: Place 2 sprays into both nostrils 2 (two) times daily. ) 16 g 6  . furosemide (LASIX) 20 MG tablet Take 1 tablet (20 mg total) by mouth 2 (two) times daily. 30 tablet 1  . HYDROcodone-acetaminophen (NORCO) 10-325 MG per tablet Take 1 tablet by mouth every 5 (five) hours as needed for moderate pain.     . metoprolol tartrate (LOPRESSOR) 25 MG tablet Take 25 mg by mouth 2 (two) times daily.     . predniSONE (DELTASONE) 10 MG tablet Take 2 tablets (20 mg total) by mouth daily. 180 tablet 3  . sulfamethoxazole-trimethoprim (BACTRIM DS,SEPTRA DS) 800-160 MG tablet Take 1 tablet by mouth 2 (two) times daily. 20 tablet 0  . vitamin B-12 (CYANOCOBALAMIN) 1000 MCG tablet Take 1,000 mcg by mouth daily.    Marland Kitchen warfarin (  COUMADIN) 5 MG tablet Take 2.5-5 mg by mouth daily. M,W, F trake 1 whole tab. All other days 1/2 tab      No results found for this or any previous visit (from the past 48 hour(s)). No results found.  Review of Systems  All other systems reviewed and are negative.   There were no vitals taken for this visit. Physical Exam  On examination patient is alert oriented no enough a well-dressed normal affect normal respiratory effort he has an antalgic gait. He has arthritis involving upper and lower extremities. Examination the left foot has a good pulse he has a well-healed great toe amputation. He has ulceration osteomyelitis cellulitis of the left foot second toe Assessment/Plan Assessment: Osteomyelitis ulceration left foot second toe.  Plan: We'll plan for amputation of the left  foot second toe risk and benefits were discussed including nonhealing the wound need for additional surgery. Patient states he understands and wishes to proceed at this time.  Newt Minion, MD 06/11/2016, 9:17 AM

## 2016-06-11 NOTE — Op Note (Signed)
06/11/2016  11:06 AM  PATIENT:  Richard Bradley    PRE-OPERATIVE DIAGNOSIS:  Left 2nd Toe Osteomyelitis  POST-OPERATIVE DIAGNOSIS:  Same  PROCEDURE:  Left 2nd Toe Amputation  SURGEON:  Newt Minion, MD  PHYSICIAN ASSISTANT:None ANESTHESIA:   General  PREOPERATIVE INDICATIONS:  [name] is a  82 y.o. male with a diagnosis of Left 2nd Toe Osteomyelitis who failed conservative measures and elected for surgical management.    The risks benefits and alternatives were discussed with the patient preoperatively including but not limited to the risks of infection, bleeding, nerve injury, cardiopulmonary complications, the need for revision surgery, among others, and the patient was willing to proceed.  OPERATIVE IMPLANTS: none   OPERATIVE FINDINGS: minimal petechial bleeding  OPERATIVE PROCEDURE: Patient brought the operating room after undergoing an ankle block. After adequate levels anesthesia were obtained patient's left lower extremity was prepped using DuraPrep draped into a sterile field a timeout was called. A racquet incision was just made distal to the MTP joint left foot second toe the toe was amputated through the MTP joint. There is minimal petechial bleeding. The wound was irrigated with normal saline incision was closed using 2-0 nylon. A sterile dressing was applied patient was taken the PACU in stable condition plan for discharge to home.

## 2016-06-11 NOTE — Anesthesia Procedure Notes (Signed)
Anesthesia Regional Block:  Ankle block  Pre-Anesthetic Checklist: ,, timeout performed, Correct Patient, Correct Site, Correct Laterality, Correct Procedure, Correct Position, site marked, Risks and benefits discussed,  Surgical consent,  Pre-op evaluation,  At surgeon's request and post-op pain management  Laterality: Lower and Left  Prep: chloraprep       Needles:  Injection technique: Single-shot  Needle Type: Quincke     Needle Length: 4cm 4 cm Needle Gauge: 25 and 25 G    Additional Needles:  Procedures: other  (perineural infiltration) Ankle block Narrative:  Start time: 06/11/2016 10:25 AM End time: 06/11/2016 10:36 AM Injection made incrementally with aspirations every 5 mL.  Performed by: Personally  Anesthesiologist: Glennon Mac, Kataryna Mcquilkin  Additional Notes: Pt identified in Holding room.  Monitors applied. Working IV access confirmed. Sterile prep L .  #25ga local infiltration around deep and sup peroneal, saph, sural, and post tib nerves .  Total 40cc 0.5% Bupivacaine injected incrementally.  Patient asymptomatic, VSS, no heme aspirated, tolerated well.  Jenita Seashore, MD

## 2016-06-12 ENCOUNTER — Ambulatory Visit (INDEPENDENT_AMBULATORY_CARE_PROVIDER_SITE_OTHER): Payer: Medicare Other | Admitting: Internal Medicine

## 2016-06-12 ENCOUNTER — Encounter (HOSPITAL_COMMUNITY): Payer: Self-pay | Admitting: Orthopedic Surgery

## 2016-06-12 ENCOUNTER — Ambulatory Visit: Payer: Medicare Other | Admitting: Podiatry

## 2016-06-12 VITALS — BP 123/67 | HR 105 | Temp 98.2°F | Ht 75.0 in | Wt 177.6 lb

## 2016-06-12 DIAGNOSIS — G7000 Myasthenia gravis without (acute) exacerbation: Secondary | ICD-10-CM

## 2016-06-12 DIAGNOSIS — R079 Chest pain, unspecified: Secondary | ICD-10-CM | POA: Diagnosis not present

## 2016-06-12 DIAGNOSIS — Z8619 Personal history of other infectious and parasitic diseases: Secondary | ICD-10-CM | POA: Diagnosis not present

## 2016-06-12 DIAGNOSIS — E876 Hypokalemia: Secondary | ICD-10-CM | POA: Diagnosis not present

## 2016-06-12 DIAGNOSIS — J181 Lobar pneumonia, unspecified organism: Secondary | ICD-10-CM

## 2016-06-12 DIAGNOSIS — Z09 Encounter for follow-up examination after completed treatment for conditions other than malignant neoplasm: Secondary | ICD-10-CM | POA: Diagnosis not present

## 2016-06-12 DIAGNOSIS — J189 Pneumonia, unspecified organism: Secondary | ICD-10-CM

## 2016-06-12 DIAGNOSIS — Z8701 Personal history of pneumonia (recurrent): Secondary | ICD-10-CM | POA: Diagnosis not present

## 2016-06-12 DIAGNOSIS — R0782 Intercostal pain: Secondary | ICD-10-CM

## 2016-06-12 NOTE — Assessment & Plan Note (Addendum)
Assessment He was also found to have potassium 2.7 on admission which improved with supplementation. Magnesium was not checked on admission and could be contributory as he is on oral furosemide.  Plan  -Check BMET and magnesium with plan to call him if supplementation as needed  ADDENDUM 06/13/2016  9:06 AM:  BMET without any potassium abnormalities. Magnesium 2.2, within reassuring limits. There is an elevated anion gap of 17, but his bicarbonate 23 is within normal limits. I will mail results to him as promised.

## 2016-06-12 NOTE — Patient Instructions (Addendum)
For your pain, we expect it will get better. Try your pain pill and heating pads.  For your potassium, we will give you a call back if we need to start any new medications.

## 2016-06-12 NOTE — Progress Notes (Signed)
   CC: chest pain  HPI:  Richard Bradley is a 82 y.o. male who presents today for chest pain. Please see assessment & plan for status of chronic medical problems.   Past Medical History:  Diagnosis Date  . Atrial fibrillation ([place])   . Cellulitis    left arm  . CHF (congestive heart failure) ([place])   . Constipation   . Dysrhythmia    hx of atrial fibrilation  . GERD (gastroesophageal reflux disease)    ocassional  . History of kidney stones   . Myasthenia gravis   . Osteoarthritis   . Pneumonia   . Prostate cancer ([place]) dx'd 1982  . Sepsis ([place])   . Shortness of breath dyspnea    with exertion  . Urine incontinence   . Varicose veins of left lower extremity     Review of Systems:  Please see each problem below for a pertinent review of systems.  Physical Exam:  Vitals:   06/12/16 1343  BP: 123/67  Pulse: (!) 105  Temp: 98.2 F (36.8 C)  TempSrc: Oral  SpO2: 94%  Weight: 177 lb 9.6 oz (80.6 kg)  Height: 6\' 3"  (1.905 m)   Physical Exam  Constitutional: No distress.  HENT:  Head: Normocephalic and atraumatic.  Eyes: Conjunctivae are normal. No scleral icterus.  Cardiovascular:  Irregular rhythm  Pulmonary/Chest: Effort normal. No respiratory distress.  Musculoskeletal:  Tenderness to palpation of the chest wall just lateral to the right nipple extending back to the lower margin of his scapula  Skin: He is not diaphoretic.  No lesions noted alongside the T4 dermatome     Assessment & Plan:   See Encounters Tab for problem based charting.  Patient seen with Dr. Evette Doffing

## 2016-06-12 NOTE — Assessment & Plan Note (Signed)
Assessment He was hospitalized 05/24/16 through 05/25/16 for sepsis in the setting of community-acquired pneumonia. He was discharged on oral cefuroxime and azithromycin to complete a total seven-day course. Chest x-rays ordered during this admission showed stable calcifications in the upper lung zones along with airspace disease without a clear infiltrate.  He is well appearing today and without constitutional symptoms. He also completed his antibiotic therapy, so I do not suspect any lingering infection.  Plan -I will recommend against obtaining a chest x-ray to document resolution of infiltrate only because I could not visualize 1 clearly on the prior films.

## 2016-06-12 NOTE — Assessment & Plan Note (Signed)
Assessment He was hospitalized 05/24/16 through 05/25/16 for sepsis in the setting of community-acquired pneumonia. He was discharged on oral cefuroxime and azithromycin to complete a total seven-day course. Chest x-rays ordered during this admission showed stable calcifications in the upper lung zones along with airspace disease without a clear infiltrate.  Today, he complains of right-sided pleuritic chest pain which was sudden onset last night when he was trying to sleep. The pain radiates from his right nipple to the back. Initially, he denied any recent over exertion though recalled he had to move himself yesterday while he underwent toe amputation. He denies any fevers, antecedent cough, poor appetite, new skin lesions though has had a prior history of shingles 10 years ago.  Given the reproducible nature of the pain in the character of it, I suspect he may have strained a muscle yesterday when he was trying to move himself. NSAIDs would not be ideal given his comorbid myasthenia gravis.  Plan -Recommended he continue using his hydrocodone/acetaminophen as prescribed -Recommended he also tried heating pads and assured him that the pain will continue to improve in the coming days

## 2016-06-13 ENCOUNTER — Encounter: Payer: Self-pay | Admitting: Internal Medicine

## 2016-06-13 LAB — BMP8+ANION GAP
ANION GAP: 17 mmol/L (ref 10.0–18.0)
BUN/Creatinine Ratio: 18 (ref 10–24)
BUN: 21 mg/dL (ref 8–27)
CALCIUM: 8.4 mg/dL — AB (ref 8.6–10.2)
CHLORIDE: 100 mmol/L (ref 96–106)
CO2: 23 mmol/L (ref 18–29)
Creatinine, Ser: 1.18 mg/dL (ref 0.76–1.27)
GFR calc Af Amer: 63 mL/min/{1.73_m2} (ref >59)
GFR, EST NON AFRICAN AMERICAN: 55 mL/min/{1.73_m2} — AB (ref >59)
GLUCOSE: 187 mg/dL — AB (ref 65–99)
POTASSIUM: 4.4 mmol/L (ref 3.5–5.2)
Sodium: 140 mmol/L (ref 134–144)

## 2016-06-13 LAB — MAGNESIUM: Magnesium: 2.2 mg/dL (ref 1.6–2.3)

## 2016-06-13 NOTE — Progress Notes (Signed)
Internal Medicine Clinic Attending  I saw and evaluated the patient.  I personally confirmed the key portions of the history and exam documented by Dr. Patel,Rushil and I reviewed pertinent patient test results.  The assessment, diagnosis, and plan were formulated together and I agree with the documentation in the resident's note. 

## 2016-06-14 ENCOUNTER — Ambulatory Visit (INDEPENDENT_AMBULATORY_CARE_PROVIDER_SITE_OTHER): Payer: Medicare Other

## 2016-06-14 ENCOUNTER — Ambulatory Visit (INDEPENDENT_AMBULATORY_CARE_PROVIDER_SITE_OTHER): Payer: Medicare Other | Admitting: Osteopathic Medicine

## 2016-06-14 ENCOUNTER — Ambulatory Visit: Payer: Medicare Other

## 2016-06-14 VITALS — BP 118/74 | HR 120 | Temp 98.4°F | Resp 20 | Ht 70.0 in | Wt 174.0 lb

## 2016-06-14 DIAGNOSIS — R0781 Pleurodynia: Secondary | ICD-10-CM

## 2016-06-14 DIAGNOSIS — J189 Pneumonia, unspecified organism: Secondary | ICD-10-CM

## 2016-06-14 DIAGNOSIS — R0981 Nasal congestion: Secondary | ICD-10-CM

## 2016-06-14 MED ORDER — AMOXICILLIN-POT CLAVULANATE 875-125 MG PO TABS: 1.0000 | ORAL_TABLET | Freq: Two times a day (BID) | ORAL | 0 refills | Status: DC

## 2016-06-14 MED ORDER — IPRATROPIUM BROMIDE 0.03 % NA SOLN: 2.0000 | Freq: Three times a day (TID) | NASAL | 1 refills | Status: DC | PRN

## 2016-06-14 NOTE — Patient Instructions (Addendum)
   Most likely your symptoms are due to viral upper respiratory illness or allergies causing postnasal drip & phlegm production and sinus congestion. The common cold and similar viral illnesses are not something that we can cure, but we can help control symptoms while your body fights the infection, and we can treat for allergy symptoms as well in case this is more the problem.   You've been given prescription for nasal spray to help with sinus drainage, or you can use Flonase or generic equivalent, +/- Claritin or similar antihistamine though I would avoid Benadryl.   If your nasal congestion is not better 7 days after it started, please fill the antibiotics.   Be sure you're staying well hydrated with water or warm tea with honey.   Most people feel better from a viral upper respiratory infection in 7-10 days, though cough symptoms can linger for several weeks.   If allergy medicine is helping or if symptoms worsen, please let us know if you're not getting better or if you get worse, or if you have any questions!     IF you received an x-ray today, you will receive an invoice from [facility]. Please contact [facility] at [phone] with questions or concerns regarding your invoice.   IF you received labwork today, you will receive an invoice from [employer]. Please contact [facility] at [phone] with questions or concerns regarding your invoice.   Our billing staff will not be able to assist you with questions regarding bills from these companies.  You will be contacted with the lab results as soon as they are available. The fastest way to get your results is to activate your My Chart account. Instructions are located on the last page of this paperwork. If you have not heard from [place] regarding the results in 2 weeks, please contact this office.

## 2016-06-14 NOTE — Progress Notes (Signed)
HPI: Richard Bradley is a 82 y.o. male  who presents to [facility] today, 06/14/16,  for chief complaint of:  Chief Complaint  Patient presents with  . Nasal Congestion  . Chest congestion    Rt side of chest is sore - lots of phlegm    Chest discomfort . Context: . Location: R front . Quality/Timing: sore/sharp with deep breaths, otherwise not bothering him  . Severity: mild . Duration: few days . Modifying factors: . Assoc signs/symptoms: no fever/chills, no chest pressure, no dizziness  Nasal Congestion . Location: sinuses . Quality: congestion, mucus production  Timing: usually in the morning and better throughout the day . Duration: few weeks, probably since hospital discharge . Context: CAP treatment last month, see below. Pt feels this is unrelated tough he wasn't having much problem prior to being in the hospital. No Hx allergies or sinus problems  . Modifying factors: . Assoc signs/symptoms: denies coughing, occasionally with feel like he needs to hack up phlegm, no SOB    Records reviewed: recently seen in [facility] 2 days ago for chest pain, hospital f/u   post discharge for sepsis 2/2 CAP, admitted 05/24/16-10/28-17, he was sent home on po abx which were completed: cefuroxime and azithromycin.   Pt also recently in the hospital for toe amputation d/t OM 06/11/16.   2 days ago in clinic, reproducible R pleuritic chest pain, no fever/cough, was advised pain would improve. XR deferred    Patient is accompanied by caregiver who assists with history-taking.   Past medical, surgical, social and family history reviewed: Past Medical History:  Diagnosis Date  . Atrial fibrillation ([place])   . Cellulitis    left arm  . CHF (congestive heart failure) ([place])   . Constipation   . Dysrhythmia    hx of atrial fibrilation  . GERD (gastroesophageal reflux disease)    ocassional  . History of kidney stones   . Myasthenia gravis    . Osteoarthritis   . Pneumonia   . Prostate cancer ([place]) dx'd 1982  . Sepsis ([place])   . Shortness of breath dyspnea    with exertion  . Urine incontinence   . Varicose veins of left lower extremity    Past Surgical History:  Procedure Laterality Date  . AMPUTATION Left 03/20/2016   Procedure: Left Great Toe Amputation at Metatarsophalangeal Joint;  Surgeon: Newt Minion, MD;  Location: [place];  Service: Orthopedics;  Laterality: Left;  . AMPUTATION Left 06/11/2016   Procedure: Left 2nd Toe Amputation;  Surgeon: Newt Minion, MD;  Location: [place];  Service: Orthopedics;  Laterality: Left;  . appendectomy    . APPENDECTOMY    . COLONOSCOPY    . HERNIA REPAIR Bilateral    Inguinal  . I&D EXTREMITY Left 10/12/2014   Procedure: IRRIGATION AND DEBRIDEMENT INDEX FINGER;  Surgeon: Iran Planas, MD;  Location: [place];  Service: Orthopedics;  Laterality: Left;  . KYPHOPLASTY  2012 ?   T 12- L1  . LAMINECTOMY  2005 ?   lumbar- 4-5  . PROSTATE SURGERY    . ROTATOR CUFF REPAIR Right   . TOTAL KNEE ARTHROPLASTY Right    Social History  Substance Use Topics  . Smoking status: Never Smoker  . Smokeless tobacco: Never Used  . Alcohol use 2.4 - 3.0 oz/week    4 - 5 Cans of beer per week   Family History  Problem Relation Age of Onset  .  Heart disease Father   . Heart disease Brother   . Heart disease Paternal Grandfather   . Stroke Mother   . Parkinsonism Sister   . Heart disease Sister   . Dementia Sister      Current medication list and allergy/intolerance information reviewed:   Current Outpatient Prescriptions  Medication Sig Dispense Refill  . Cholecalciferol (VITAMIN D-3 PO) Take 2,000 Units by mouth daily.     Marland Kitchen FIBER PO Take 1 tablet by mouth 3 (three) times daily.     . fluticasone (FLONASE) 50 MCG/ACT nasal spray Place 2 sprays into both nostrils daily as needed for allergies or rhinitis. (Patient taking differently: Place 2 sprays into both nostrils 2 (two) times daily. )  16 g 6  . furosemide (LASIX) 20 MG tablet Take 1 tablet (20 mg total) by mouth 2 (two) times daily. 30 tablet 1  . HYDROcodone-acetaminophen (NORCO) 10-325 MG per tablet Take 1 tablet by mouth every 5 (five) hours as needed for moderate pain.     . metoprolol tartrate (LOPRESSOR) 25 MG tablet Take 25 mg by mouth 2 (two) times daily.     . predniSONE (DELTASONE) 10 MG tablet Take 2 tablets (20 mg total) by mouth daily. 180 tablet 3  . vitamin B-12 (CYANOCOBALAMIN) 1000 MCG tablet Take 1,000 mcg by mouth daily.    Marland Kitchen warfarin (COUMADIN) 5 MG tablet Take 2.5-5 mg by mouth daily. M,W, F trake 1 whole tab. All other days 1/2 tab     No current facility-administered medications for this visit.    No Known Allergies    Review of Systems:  Constitutional:  No  fever, no chills, +recent illness, No unintentional weight changes. No significant fatigue.   HEENT: No  headache, no vision change, no hearing change, No sore throat, +sinus pressure  Cardiac: No  chest pressure, No palpitations, No  Orthopnea, +murmur longstanding  Respiratory:  No  shortness of breath. No  Cough  Gastrointestinal: No  abdominal pain, No  nausea   Musculoskeletal: No new myalgia/arthralgia except chest discomfort as per HPI  Skin: No  Rash,  Hem/Onc: No  easy bruising/bleeding, No  abnormal lymph node  Neurologic: No  weakness, No  dizziness   Exam:  BP 118/74 (BP Location: Right Arm, Patient Position: Sitting, Cuff Size: Large)   Pulse (!) 120   Temp 98.4 F (36.9 C) (Oral)   Resp 20   Ht 5\' 10"  (1.778 m)   Wt 174 lb (78.9 kg)   SpO2 94%   BMI 24.97 kg/m   Constitutional: VS see above. General Appearance: alert, well-developed, well-nourished, NAD  Eyes: Normal lids and conjunctive, non-icteric sclera  Ears, Nose, Mouth, Throat: MMM, Normal external inspection ears/nares/mouth/lips/gums. TM normal bilaterally. Pharynx/tonsils no erythema, no exudate. Nasal mucosa normal.   Neck: No masses, trachea  midline. No thyroid enlargement. No tenderness/mass appreciated. No lymphadenopathy  Respiratory: Normal respiratory effort. no wheeze, no rhonchi, no rales  Cardiovascular: S1/S2 normal, [id number] systolic murmur, no rub/gallop auscultated. RRR. No lower extremity edema.   Gastrointestinal: Nontender, no masses. No hepatomegaly, no splenomegaly. No hernia appreciated. Bowel sounds normal. Rectal exam deferred.   Musculoskeletal: pronounced thoracic kyphosis, Gait normal. No clubbing/cyanosis of digits. Chest wall nontender but reports discomfort on R front side with deep breath in.   Neurological: No cranial nerve deficit on limited exam. Motor and sensation intact and symmetric. Cerebellar reflexes intact. Normal balance/coordination. No tremor.   Skin: warm, dry, intact. No rash/ulcer  Psychiatric: Normal judgment/insight.  Normal mood and affect. Oriented x3.    Results for orders placed or performed in visit on 06/12/16 (from the past 72 hour(s))  BMP8+Anion Gap     Status: Abnormal   Collection Time: 06/12/16  2:23 PM  Result Value Ref Range   Glucose 187 (H) 65 - 99 mg/dL   BUN 21 8 - 27 mg/dL   Creatinine, Ser 1.18 0.76 - 1.27 mg/dL   GFR calc non Af Amer 55 (L) >59 mL/min/1.73   GFR calc Af Amer 63 >59 mL/min/1.73   BUN/Creatinine Ratio 18 10 - 24   Sodium 140 134 - 144 mmol/L   Potassium 4.4 3.5 - 5.2 mmol/L   Chloride 100 96 - 106 mmol/L   CO2 23 18 - 29 mmol/L   Anion Gap 17.0 10.0 - 18.0 mmol/L   Calcium 8.4 (L) 8.6 - 10.2 mg/dL  Magnesium     Status: None   Collection Time: 06/12/16  2:23 PM  Result Value Ref Range   Magnesium 2.2 1.6 - 2.3 mg/dL    Dg Chest 2 View  Result Date: 06/14/2016 CLINICAL DATA:  Pleuritic chest pain EXAM: CHEST  2 VIEW COMPARISON:  May 25, 2016 FINDINGS: The previously noted bibasilar effusions and airspace disease have cleared. Currently, there is no edema or consolidation. Calcified pleural plaques in the upper lobes bilaterally  remains stable. There is cardiomegaly with pulmonary vascularity within normal limits. There is atherosclerotic calcification in the aorta. There is no evident adenopathy. There are several thoracic compression fractures, stable. Kyphoplasty procedures have been performed at T11 and L1, stable. Contrast extravasation anterior to L1 is stable. IMPRESSION: Calcified pleural plaques in the upper lobe regions. No edema or consolidation currently. Stable cardiomegaly. There is aortic atherosclerosis. Wedge fractures in the thoracic and upper lumbar regions appear stable. Electronically Signed   By: Lowella Grip III M.D.   On: 06/14/2016 15:39   CXR personally reviewed, appears improved.     ASSESSMENT/PLAN:   Sinus congestion - Advised Atrovent or Flonase +/- nasal saline, can add Claritin, can try air filter or humidifier near bed since this seems to be worse in am. Abx print if need - Plan: amoxicillin-clavulanate (AUGMENTIN) 875-125 MG tablet, ipratropium (ATROVENT) 0.03 % nasal spray  Community acquired pneumonia, unspecified laterality - appers resolved, no fever, SOB or productive cough at this point - Plan: DG Chest 2 View  Pleuritic chest pain - likely MSK strain, ER/RTC precautions reviewed for cardiac symptoms - Plan: DG Chest 2 View     Patient Instructions    Most likely your symptoms are due to viral upper respiratory illness or allergies causing postnasal drip & phlegm production and sinus congestion. The common cold and similar viral illnesses are not something that we can cure, but we can help control symptoms while your body fights the infection, and we can treat for allergy symptoms as well in case this is more the problem.   You've been given prescription for nasal spray to help with sinus drainage, or you can use Flonase or generic equivalent, +/- Claritin or similar antihistamine though I would avoid Benadryl.   If your nasal congestion is not better 7 days after it  started, please fill the antibiotics.   Be sure you're staying well hydrated with water or warm tea with honey.   Most people feel better from a viral upper respiratory infection in 7-10 days, though cough symptoms can linger for several weeks.   If allergy medicine is helping or  if symptoms worsen, please let us know if you're not getting better or if you get worse, or if you have any questions!     IF you received an x-ray today, you will receive an invoice from [facility]. Please contact [facility] at [phone] with questions or concerns regarding your invoice.   IF you received labwork today, you will receive an invoice from [employer]. Please contact [facility] at [phone] with questions or concerns regarding your invoice.   Our billing staff will not be able to assist you with questions regarding bills from these companies.  You will be contacted with the lab results as soon as they are available. The fastest way to get your results is to activate your My Chart account. Instructions are located on the last page of this paperwork. If you have not heard from [place] regarding the results in 2 weeks, please contact this office.            Visit summary with medication list and pertinent instructions was printed for patient to review. All questions at time of visit were answered - patient instructed to contact office with any additional concerns. ER/RTC precautions were reviewed with the patient. Follow-up plan: Return if symptoms worsen or fail to improve.

## 2016-06-18 ENCOUNTER — Telehealth: Payer: Self-pay | Admitting: Interventional Cardiology

## 2016-06-18 ENCOUNTER — Encounter (INDEPENDENT_AMBULATORY_CARE_PROVIDER_SITE_OTHER): Payer: Self-pay | Admitting: Orthopedic Surgery

## 2016-06-18 ENCOUNTER — Ambulatory Visit (INDEPENDENT_AMBULATORY_CARE_PROVIDER_SITE_OTHER): Payer: Medicare Other | Admitting: Orthopedic Surgery

## 2016-06-18 VITALS — Ht 70.0 in | Wt 174.0 lb

## 2016-06-18 DIAGNOSIS — Z89422 Acquired absence of other left toe(s): Secondary | ICD-10-CM

## 2016-06-18 DIAGNOSIS — S98132A Complete traumatic amputation of one left lesser toe, initial encounter: Secondary | ICD-10-CM

## 2016-06-18 NOTE — Telephone Encounter (Signed)
Will route to Medical Records.  Nurse needing information from last echo.

## 2016-06-18 NOTE — Telephone Encounter (Signed)
New Message  Gentry Roch from the Heart Failure program call requesting to speak with RN. Ms Rosalyn Gess states pt qualifies for the program and states he does have HF. Ms Rosalyn Gess stats she would need to get some information from pts last 2d echo and also the date of echo. Please call back to discuss; so that weight monitor can be sent to pts home.

## 2016-06-18 NOTE — Progress Notes (Signed)
Wound Care Note   Patient: Richard Bradley           Date of Birth: January 28, 1926           MRN: [id number]             PCP: No PCP Per Patient Visit Date: 06/18/2016   Assessment & Plan: Visit Diagnoses:  1. Amputated toe, left (HCC)   1 week status post left foot second toe amputation MTP joint.  Plan: Wash with soap and water and apply a Band-Aid regular sock and a regular shoe weightbearing as tolerated follow-up in 2 weeks to harvest the sutures.   Follow-Up Instructions: Return in about 2 weeks (around 07/02/2016).  Orders:  No orders of the defined types were placed in this encounter.  No orders of the defined types were placed in this encounter.     Procedures: No notes on file   Clinical Data: No additional findings.   No images are attached to the encounter.   Subjective: Chief Complaint  Patient presents with  . Left Foot - Routine Post Op    06/11/16 s/p left foot second toe amputation    Patient is s/p a left foot second toe amputation 7 days out. Pt is weight bearing with an cane and post op shoe. He feels well and has not had any complaints of pain. Incision is well approximated without redness or drainage.     Review of Systems  Miscellaneous:     Objective: Vital Signs: Ht 5\' 10"  (1.778 m)   Wt 174 lb (78.9 kg)   BMI 24.97 kg/m   Physical Exam: Examination patient's incision is well approximated there is no redness no synovitis no drainage no signs of infection. Band-Aid and Iodosorb were applied.  Specialty Comments: No specialty comments available.   PMFS History: Patient Active Problem List   Diagnosis Date Noted  . Amputated toe, left ([place]) 06/18/2016  . Chest pain 06/12/2016  . Osteomyelitis of toe of left foot ([place])   . Pyogenic inflammation of bone ([place]) 06/05/2016  . Onychomycosis 06/05/2016  . CAP (community acquired pneumonia) 05/25/2016  . Hypokalemia 05/25/2016  . Sepsis ([place]) 05/24/2016  . Chronic diastolic  congestive heart failure ([place])   . Insomnia 08/12/2014  . Encounter for therapeutic drug monitoring 10/26/2013  . 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 Seasonal allergic rhinitis 05/26/2012  . Myasthenia gravis ([place]) 06/27/2011  . Prostate cancer ([place])   . Atrial fibrillation ([place])   . Osteoarthritis    Past Medical History:  Diagnosis Date  . Atrial fibrillation ([place])   . Cellulitis    left arm  . CHF (congestive heart failure) ([place])   . Constipation   . Dysrhythmia    hx of atrial fibrilation  . GERD (gastroesophageal reflux disease)    ocassional  . History of kidney stones   . Myasthenia gravis   . Osteoarthritis   . Pneumonia   . Prostate cancer ([place]) dx'd 1982  . Sepsis ([place])   . Shortness of breath dyspnea    with exertion  . Urine incontinence   . Varicose veins of left lower extremity     Family History  Problem Relation Age of Onset  . Heart disease Father   . Heart disease Brother   . Heart disease Paternal Grandfather   . Stroke Mother   . Parkinsonism Sister   . Heart disease Sister   . Dementia Sister    Past Surgical History:  Procedure  Laterality Date  . AMPUTATION Left 03/20/2016   Procedure: Left Great Toe Amputation at Metatarsophalangeal Joint;  Surgeon: Newt Minion, MD;  Location: [place];  Service: Orthopedics;  Laterality: Left;  . AMPUTATION Left 06/11/2016   Procedure: Left 2nd Toe Amputation;  Surgeon: Newt Minion, MD;  Location: [place];  Service: Orthopedics;  Laterality: Left;  . appendectomy    . APPENDECTOMY    . COLONOSCOPY    . HERNIA REPAIR Bilateral    Inguinal  . I&D EXTREMITY Left 10/12/2014   Procedure: IRRIGATION AND DEBRIDEMENT INDEX FINGER;  Surgeon: Iran Planas, MD;  Location: [place];  Service: Orthopedics;  Laterality: Left;  . KYPHOPLASTY  2012 ?   T 12- L1  . LAMINECTOMY  2005 ?   lumbar- 4-5  . PROSTATE SURGERY    . ROTATOR CUFF REPAIR Right   . TOTAL KNEE ARTHROPLASTY Right    Social History    Occupational History  . Retired [employer]   Social History Main Topics  . Smoking status: Never Smoker  . Smokeless tobacco: Never Used  . Alcohol use 2.4 - 3.0 oz/week    4 - 5 Cans of beer per week  . Drug use: No  . Sexual activity: Not on file     Comment: Widowed

## 2016-07-01 ENCOUNTER — Encounter: Payer: Self-pay | Admitting: *Deleted

## 2016-07-01 ENCOUNTER — Ambulatory Visit (INDEPENDENT_AMBULATORY_CARE_PROVIDER_SITE_OTHER): Payer: Medicare Other | Admitting: Family Medicine

## 2016-07-01 ENCOUNTER — Ambulatory Visit (HOSPITAL_COMMUNITY)
Admission: RE | Admit: 2016-07-01 | Discharge: 2016-07-01 | Disposition: A | Discharge status: Home / Self Care | Payer: Medicare Other | Source: Ambulatory Visit | Attending: Family Medicine | Admitting: Family Medicine

## 2016-07-01 VITALS — BP 128/80 | HR 78 | Temp 98.3°F | Resp 17 | Ht 70.0 in | Wt 178.0 lb

## 2016-07-01 DIAGNOSIS — I809 Phlebitis and thrombophlebitis of unspecified site: Secondary | ICD-10-CM

## 2016-07-01 DIAGNOSIS — M7989 Other specified soft tissue disorders: Secondary | ICD-10-CM | POA: Insufficient documentation

## 2016-07-01 DIAGNOSIS — Z7901 Long term (current) use of anticoagulants: Secondary | ICD-10-CM | POA: Diagnosis not present

## 2016-07-01 DIAGNOSIS — L089 Local infection of the skin and subcutaneous tissue, unspecified: Secondary | ICD-10-CM | POA: Diagnosis not present

## 2016-07-01 DIAGNOSIS — M7122 Synovial cyst of popliteal space [Baker], left knee: Secondary | ICD-10-CM | POA: Insufficient documentation

## 2016-07-01 DIAGNOSIS — M79662 Pain in left lower leg: Secondary | ICD-10-CM | POA: Insufficient documentation

## 2016-07-01 LAB — POCT CBC
GRANULOCYTE PERCENT: 86.8 % — AB (ref 37–80)
HCT, POC: 36.7 % — AB (ref 43.5–53.7)
Hemoglobin: 12.8 g/dL — AB (ref 14.1–18.1)
Lymph, poc: 1.3 (ref 0.6–3.4)
MCH: 33.9 pg — AB (ref 27–31.2)
MCHC: 34.8 g/dL (ref 31.8–35.4)
MCV: 97.5 fL — AB (ref 80–97)
MID (CBC): 0.5 (ref 0–0.9)
MPV: 8.2 fL (ref 0–99.8)
POC Granulocyte: 11.8 — AB (ref 2–6.9)
POC LYMPH PERCENT: 9.7 %L — AB (ref 10–50)
POC MID %: 3.5 % (ref 0–12)
Platelet Count, POC: 157 10*3/uL (ref 142–424)
RBC: 3.76 M/uL — AB (ref 4.69–6.13)
RDW, POC: 14.9 %
WBC: 13.6 10*3/uL — AB (ref 4.6–10.2)

## 2016-07-01 LAB — D-DIMER, QUANTITATIVE: D-DIMER: 0.59 mg/L FEU — ABNORMAL HIGH (ref 0.00–0.49)

## 2016-07-01 MED ORDER — FUROSEMIDE 20 MG PO TABS: ORAL_TABLET | ORAL | 1 refills | Status: DC

## 2016-07-01 MED ORDER — CEPHALEXIN 500 MG PO CAPS: 500.0000 mg | ORAL_CAPSULE | Freq: Two times a day (BID) | ORAL | 0 refills | Status: DC

## 2016-07-01 NOTE — Progress Notes (Signed)
Patient ID: Richard Bradley, male    DOB: May 06, 1926, 82 y.o.   MRN: [id number]  PCP: [name], MD  Chief Complaint  Patient presents with  . Leg Pain    Subjective:   HPI 82 year old male present for evaluation of left leg pain with calf swelling. Pt presents newly to [facility]. Past medical hx of chronic atrial fibrillation, CHF, and osteoarthritis. Reports awakening with acute pain of left lower calf this morning. Calf is warm, tender to touch and with ambulation. Three weeks ago he had two toes removed by [employer], Dr. Mickle Plumb due to insufficient blood flow and delay wound healing. He has been wearing compression hose daily for chronic lower extremity edema. Last Coumadin INR-1.87    Review of Systems See HPI   Patient Active Problem List   Diagnosis Date Noted  . Amputated toe, left ([place]) 06/18/2016  . Chest pain 06/12/2016  . Osteomyelitis of toe of left foot ([place])   . Pyogenic inflammation of bone ([place]) 06/05/2016  . Onychomycosis 06/05/2016  . CAP (community acquired pneumonia) 05/25/2016  . Hypokalemia 05/25/2016  . Sepsis ([place]) 05/24/2016  . Chronic diastolic congestive heart failure ([place])   . Insomnia 08/12/2014  . Encounter for therapeutic drug monitoring 10/26/2013  . 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 Seasonal allergic rhinitis 05/26/2012  . Myasthenia gravis ([place]) 06/27/2011  . Prostate cancer ([place])   . Atrial fibrillation ([place])   . Osteoarthritis      Prior to Admission medications   Medication Sig Start Date End Date Taking? Authorizing Provider  Cholecalciferol (VITAMIN D-3 PO) Take 2,000 Units by mouth daily.    Yes Historical Provider, MD  FIBER PO Take 1 tablet by mouth 3 (three) times daily.    Yes Historical Provider, MD  fluticasone (FLONASE) 50 MCG/ACT nasal spray Place 2 sprays into both nostrils daily as needed for allergies or rhinitis. Patient taking differently: Place 2 sprays into both nostrils 2 (two) times  daily.  07/20/15  Yes Robyn Haber, MD  furosemide (LASIX) 20 MG tablet Take 1 tablet (20 mg total) by mouth 2 (two) times daily. 07/20/15  Yes Robyn Haber, MD  HYDROcodone-acetaminophen (NORCO) 10-325 MG per tablet Take 1 tablet by mouth every 5 (five) hours as needed for moderate pain.    Yes Historical Provider, MD  ipratropium (ATROVENT) 0.03 % nasal spray Place 2 sprays into both nostrils 3 (three) times daily as needed (congestion). 06/14/16  Yes Emeterio Reeve, DO  metoprolol tartrate (LOPRESSOR) 25 MG tablet Take 25 mg by mouth 2 (two) times daily.    Yes Historical Provider, MD  predniSONE (DELTASONE) 10 MG tablet Take 2 tablets (20 mg total) by mouth daily. 04/19/16  Yes Kathrynn Ducking, MD  vitamin B-12 (CYANOCOBALAMIN) 1000 MCG tablet Take 1,000 mcg by mouth daily.   Yes Historical Provider, MD  warfarin (COUMADIN) 5 MG tablet Take 2.5-5 mg by mouth daily. M,W, F trake 1 whole tab. All other days 1/2 tab   Yes Historical Provider, MD  amoxicillin-clavulanate (AUGMENTIN) 875-125 MG tablet Take 1 tablet by mouth 2 (two) times daily. Fill Rx as directed if persistent or worsening sinus pain Patient not taking: Reported on 07/01/2016 06/14/16   Emeterio Reeve, DO     No Known Allergies     Objective:  Physical Exam  Constitutional: He is oriented to person, place, and time. He appears well-developed and well-nourished.  HENT:  Head: Normocephalic and atraumatic.  Mouth/Throat: Oropharynx is clear and  moist.  Eyes: Conjunctivae are normal. Pupils are equal, round, and reactive to light.  Cardiovascular: Normal heart sounds and intact distal pulses.   Erythema upper left calf, pitting edema bilateral +2 Increased warmth. Irregular, rhythm, normal rate.   Pulmonary/Chest: Effort normal and breath sounds normal.  Musculoskeletal: He exhibits edema and tenderness.  Neurological: He is alert and oriented to person, place, and time. He has normal reflexes.  Skin: Skin is  warm and dry.  Psychiatric: He has a normal mood and affect. His behavior is normal. Thought content normal.    Vitals:   07/01/16 0922  BP: 128/80  Pulse: 78  Resp: 17  Temp: 98.3 F (36.8 C)   Assessment & Plan:  1. Thrombophlebitis - POCT CBC - Protime-INR - D-dimer, quantitative (not at [facility]) - US Venous Img Lower Bilateral; Future - VAS [place] LOWER EXTREMITY VENOUS (DVT)  2. Long term current use of anticoagulant therapy Plan: - Protime-INR  3. Skin infection Plan: - WOUND CULTURE -Cephalexin (Keflex) 500 mg capsule 2 times daily x 14 days

## 2016-07-01 NOTE — Patient Instructions (Addendum)
  Start Cephalexin 500 mg twice daily for 14 days for wound infection.  I will contact you with the results of Venous Doppler of your lower leg.   Please go now to [facility] admitting for 11:30 Ultrasound.      IF you received an x-ray today, you will receive an invoice from [facility]. Please contact [facility] at [phone] with questions or concerns regarding your invoice.   IF you received labwork today, you will receive an invoice from [employer]. Please contact [facility] at [phone] with questions or concerns regarding your invoice.   Our billing staff will not be able to assist you with questions regarding bills from these companies.  You will be contacted with the lab results as soon as they are available. The fastest way to get your results is to activate your My Chart account. Instructions are located on the last page of this paperwork. If you have not heard from [place] regarding the results in 2 weeks, please contact this office.

## 2016-07-01 NOTE — Progress Notes (Signed)
*  PRELIMINARY RESULTS* Vascular Ultrasound Lower extremity venous duplex has been completed.  Preliminary findings: Bilateral: no evidence of DVT. Left baker's cyst noted. Anechoic areas noted in bilateral mid calf, suggestive of fluid pockets.   Called results to [facility].   Landry Mellow, RDMS, RVT  07/01/2016, 11:42 AM

## 2016-07-02 ENCOUNTER — Ambulatory Visit (INDEPENDENT_AMBULATORY_CARE_PROVIDER_SITE_OTHER): Payer: Medicare Other | Admitting: Orthopedic Surgery

## 2016-07-02 ENCOUNTER — Telehealth: Payer: Self-pay | Admitting: Radiology

## 2016-07-02 DIAGNOSIS — I87323 Chronic venous hypertension (idiopathic) with inflammation of bilateral lower extremity: Secondary | ICD-10-CM

## 2016-07-02 DIAGNOSIS — Z89412 Acquired absence of left great toe: Secondary | ICD-10-CM

## 2016-07-02 LAB — PROTIME-INR
INR: 1.4 — AB (ref 0.8–1.2)
Prothrombin Time: 14.7 s — ABNORMAL HIGH (ref 9.1–12.0)

## 2016-07-02 NOTE — Telephone Encounter (Signed)
Pts daughter called in requesting lab work, specifically the [employer]. Please review. Thank you.  CB# [phone]

## 2016-07-02 NOTE — Progress Notes (Signed)
Office Visit Note   Patient: Richard Bradley           Date of Birth: 18-Mar-1926           MRN: [id number] Visit Date: 07/02/2016              Requested by: No referring provider defined for this encounter. PCP: [name], MD   Assessment & Plan: Visit Diagnoses:  1. Idiopathic chronic venous hypertension of both lower extremities with inflammation   2. Acquired absence of left great toe (HCC)     Plan: Patient's second toe amputation is healing well. He has massive pitting edema in both lower extremities there is no open ulcers there is no cellulitis he has some mild dermatitis most recently studies have showed no evidence of a DVT. Recommend he go to [place] supply to obtain 15-20 mm compression stockings discussed that he could wear these around-the-clock if he cannot take them off ensure there is no wrinkles have his legs checked daily with his aid to ensure is not developing an ulcer. Follow-up in 4 weeks.  Follow-Up Instructions: Return in about 4 weeks (around 07/30/2016).   Orders:  No orders of the defined types were placed in this encounter.  No orders of the defined types were placed in this encounter.     Procedures: No procedures performed   Clinical Data: No additional findings.   Subjective: Chief Complaint  Patient presents with  . Left Foot - Routine Post Op    06/11/16 s/p left foot second toe amputation      Patient is 3 weeks s/p left foot second toe amputation.He is non weight bearing with a walker and a post op shoe. Stitches a re removed today and the pt has a small area of 100% fibrinous tissue in which the stitches were just laying in and not holding skin together. There is no redness and no odor. No complaints of pain. Pt covers with a band aid. The pt was very anxious asking for the results of his INR from yesterday. [place] per MD to give lab result and pt states that he will call and ask how to proceed from his doctor.       Review of Systems   Objective: Vital Signs: There were no vitals taken for this visit.  Physical Exam examination patient has good healing of the second toe amputation status post great toe amputation the left. He has venous stasis swelling with pitting edema and pain from the pitting edema. There is no cellulitis no odor no signs of infection.  Ortho Exam  Specialty Comments:  No specialty comments available.  Imaging: No results found.   PMFS History: Patient Active Problem List   Diagnosis Date Noted  . Idiopathic chronic venous hypertension of both lower extremities with inflammation 07/02/2016  . Acquired absence of left great toe ([place]) 07/02/2016  . Acute cystitis without hematuria 07/01/2016  . Amputated toe, left ([place]) 06/18/2016  . Chest pain 06/12/2016  . Osteomyelitis of toe of left foot ([place])   . Pyogenic inflammation of bone ([place]) 06/05/2016  . Onychomycosis 06/05/2016  . CAP (community acquired pneumonia) 05/25/2016  . Hypokalemia 05/25/2016  . Sepsis ([place]) 05/24/2016  . Chronic diastolic congestive heart failure ([place])   . Hypocalcemia 10/22/2015  . Influenza B 10/21/2015  . Sepsis due to Escherichia coli (E. coli) ([place]) 09/24/2015  . Chronic systolic heart failure ([place]) 04/30/2015  . Sepsis due to Escherichia coli ([place]) 04/30/2015  .  Urinary obstruction 04/25/2015  . Insomnia 08/12/2014  . Debilitated patient 05/31/2014  . Closed fracture of rib of left side 05/31/2014  . Blurry vision 02/20/2014  . Chronic steroid use 02/20/2014  . Leukocytosis 02/20/2014  . Supratherapeutic INR 02/20/2014  . Anticoagulated on Coumadin 12/20/2013  . Encounter for therapeutic drug monitoring 10/26/2013  . Low back pain 07/15/2013  . Disorder of sacrum 05/18/2013  . Hand joint pain 05/18/2013  . Abnormality of gait 04/13/2013  . 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 Seasonal allergic rhinitis 05/26/2012  . Shortness of breath 03/24/2012  . Acute  upper respiratory infection 08/02/2011  . Dilated cardiomyopathy ([place]) 08/02/2011  . GERD (gastroesophageal reflux disease) 08/02/2011  . Myasthenia exacerbation ([place]) 06/27/2011  . Prostate cancer ([place])   . Chronic atrial fibrillation ([place])   . Osteoarthritis   . Height loss 05/24/2011  . 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 Senile osteoporosis 05/24/2011   Past Medical History:  Diagnosis Date  . Atrial fibrillation ([place])   . Cellulitis    left arm  . CHF (congestive heart failure) ([place])   . Constipation   . Dysrhythmia    hx of atrial fibrilation  . GERD (gastroesophageal reflux disease)    ocassional  . History of kidney stones   . Myasthenia gravis   . Osteoarthritis   . Pneumonia   . Prostate cancer ([place]) dx'd 1982  . Sepsis ([place])   . Shortness of breath dyspnea    with exertion  . Urine incontinence   . Varicose veins of left lower extremity     Family History  Problem Relation Age of Onset  . Heart disease Father   . Heart attack Father   . Hypertension Father   . Heart disease Brother   . Heart disease Paternal Grandfather   . Stroke Mother   . Parkinsonism Sister   . Heart disease Sister   . Dementia Sister     Past Surgical History:  Procedure Laterality Date  . AMPUTATION Left 03/20/2016   Procedure: Left Great Toe Amputation at Metatarsophalangeal Joint;  Surgeon: Newt Minion, MD;  Location: [facility];  Service: Orthopedics;  Laterality: Left;  . AMPUTATION Left 06/11/2016   Procedure: Left 2nd Toe Amputation;  Surgeon: Newt Minion, MD;  Location: [place];  Service: Orthopedics;  Laterality: Left;  . appendectomy    . APPENDECTOMY    . COLONOSCOPY    . HERNIA REPAIR Bilateral    Inguinal  . I&D EXTREMITY Left 10/12/2014   Procedure: IRRIGATION AND DEBRIDEMENT INDEX FINGER;  Surgeon: Iran Planas, MD;  Location: [facility];  Service: Orthopedics;  Laterality: Left;  . KYPHOPLASTY  2012 ?   T 12- L1  . LAMINECTOMY  2005 ?   lumbar- 4-5   . PROSTATE SURGERY    . ROTATOR CUFF REPAIR Right   . TOTAL KNEE ARTHROPLASTY Right    Social History   Occupational History  . Retired [employer]   Social History Main Topics  . Smoking status: Never Smoker  . Smokeless tobacco: Never Used  . Alcohol use 2.4 - 3.0 oz/week    4 - 5 Cans of beer per week  . Drug use: No  . Sexual activity: Not on file     Comment: Widowed

## 2016-07-03 ENCOUNTER — Ambulatory Visit (INDEPENDENT_AMBULATORY_CARE_PROVIDER_SITE_OTHER): Payer: Medicare Other | Admitting: Orthopedic Surgery

## 2016-07-03 NOTE — Telephone Encounter (Signed)
Left a message on Richard Bradley VM with INR results of 1.4. He will need to take 5mg  coumadin M, W, F, and Sunday and continue 2.5 mg Tuesday and Thursday. Repeat INR on Friday 15th. Please relay this information to daughter if she calls back. Thanks,  Carroll Sage. Kenton Kingfisher, MSN, FNP-C [facility]

## 2016-07-05 ENCOUNTER — Ambulatory Visit (INDEPENDENT_AMBULATORY_CARE_PROVIDER_SITE_OTHER): Payer: Medicare Other | Admitting: *Deleted

## 2016-07-05 ENCOUNTER — Ambulatory Visit (INDEPENDENT_AMBULATORY_CARE_PROVIDER_SITE_OTHER): Payer: Medicare Other | Admitting: Interventional Cardiology

## 2016-07-05 ENCOUNTER — Encounter: Payer: Self-pay | Admitting: Interventional Cardiology

## 2016-07-05 VITALS — BP 94/66 | HR 52 | Ht 75.0 in | Wt 179.8 lb

## 2016-07-05 DIAGNOSIS — Z7901 Long term (current) use of anticoagulants: Secondary | ICD-10-CM | POA: Diagnosis not present

## 2016-07-05 DIAGNOSIS — I482 Chronic atrial fibrillation, unspecified: Secondary | ICD-10-CM

## 2016-07-05 DIAGNOSIS — R0602 Shortness of breath: Secondary | ICD-10-CM | POA: Diagnosis not present

## 2016-07-05 DIAGNOSIS — E876 Hypokalemia: Secondary | ICD-10-CM

## 2016-07-05 DIAGNOSIS — Z5181 Encounter for therapeutic drug level monitoring: Secondary | ICD-10-CM

## 2016-07-05 DIAGNOSIS — I340 Nonrheumatic mitral (valve) insufficiency: Secondary | ICD-10-CM

## 2016-07-05 DIAGNOSIS — I5022 Chronic systolic (congestive) heart failure: Secondary | ICD-10-CM

## 2016-07-05 LAB — WOUND CULTURE

## 2016-07-05 LAB — POCT INR: INR: 2.2

## 2016-07-05 NOTE — Progress Notes (Signed)
Cardiology Office Note    Date:  07/05/2016   ID:  Richard Bradley, DOB [birth year], MRN [id number]  PCP:  Henrine Screws, MD  Cardiologist: Sinclair Grooms, MD   Chief Complaint  Patient presents with  . Atrial Fibrillation    History of Present Illness:  Richard Bradley is a 82 y.o. male establishment of care for chronic atrial fibrillation, Coumadin therapy, chronic dyspnea and possible pulmonary fibrosis, chronic diastolic heart failure, myasthenia gravis, essential hypertension and diabetes mellitus, left lower extremity digit amputation  No acute problems at this time. Here to establish cardiology follow-up for chronic atrial fibrillation and diastolic heart failure. He also has a history of myasthenia gravis. Hesitancy towards lower extremity fluid retention. He has chronic dyspnea on exertion possibly related to chronic lung disease.  Past Medical History:  Diagnosis Date  . Atrial fibrillation ([place])   . Cellulitis    left arm  . CHF (congestive heart failure) ([place])   . Constipation   . Dysrhythmia    hx of atrial fibrilation  . GERD (gastroesophageal reflux disease)    ocassional  . History of kidney stones   . Myasthenia gravis   . Osteoarthritis   . Pneumonia   . Prostate cancer ([place]) dx'd 1982  . Sepsis ([place])   . Shortness of breath dyspnea    with exertion  . Urine incontinence   . Varicose veins of left lower extremity     Past Surgical History:  Procedure Laterality Date  . AMPUTATION Left 03/20/2016   Procedure: Left Great Toe Amputation at Metatarsophalangeal Joint;  Surgeon: Newt Minion, MD;  Location: [facility];  Service: Orthopedics;  Laterality: Left;  . AMPUTATION Left 06/11/2016   Procedure: Left 2nd Toe Amputation;  Surgeon: Newt Minion, MD;  Location: [facility];  Service: Orthopedics;  Laterality: Left;  . appendectomy    . APPENDECTOMY    . COLONOSCOPY    . HERNIA REPAIR Bilateral    Inguinal  . I&D EXTREMITY Left 10/12/2014   Procedure:  IRRIGATION AND DEBRIDEMENT INDEX FINGER;  Surgeon: Iran Planas, MD;  Location: [facility];  Service: Orthopedics;  Laterality: Left;  . KYPHOPLASTY  2012 ?   T 12- L1  . LAMINECTOMY  2005 ?   lumbar- 4-5  . PROSTATE SURGERY    . ROTATOR CUFF REPAIR Right   . TOTAL KNEE ARTHROPLASTY Right     Current Medications: Outpatient Medications Prior to Visit  Medication Sig Dispense Refill  . cephALEXin (KEFLEX) 500 MG capsule Take 1 capsule (500 mg total) by mouth 2 (two) times daily. 28 capsule 0  . Cholecalciferol (VITAMIN D-3 PO) Take 2,000 Units by mouth daily.     . clonazePAM (KLONOPIN) 0.5 MG tablet Take 0.5 mg by mouth daily.  1  . escitalopram (LEXAPRO) 5 MG tablet Take 5 mg by mouth daily.  3  . FIBER PO Take 1 tablet by mouth 3 (three) times daily.     . furosemide (LASIX) 20 MG tablet Take 40 mg once daily for 5 days. Then resume 20 mg twice daily. 60 tablet 1  . HYDROcodone-acetaminophen (NORCO) 10-325 MG per tablet Take 1 tablet by mouth every 5 (five) hours as needed for moderate pain.     . metoprolol tartrate (LOPRESSOR) 25 MG tablet Take 25 mg by mouth 2 (two) times daily.     . predniSONE (DELTASONE) 10 MG tablet Take 2 tablets (20 mg total) by mouth daily. 180 tablet 3  .  vitamin B-12 (CYANOCOBALAMIN) 1000 MCG tablet Take 1,000 mcg by mouth daily.    Marland Kitchen warfarin (COUMADIN) 5 MG tablet Take 2.5-5 mg by mouth daily. M,W, F trake 1 whole tab. All other days 1/2 tab    . amoxicillin-clavulanate (AUGMENTIN) 875-125 MG tablet Take 1 tablet by mouth 2 (two) times daily. Fill Rx as directed if persistent or worsening sinus pain (Patient not taking: Reported on 07/05/2016) 14 tablet 0  . fluticasone (FLONASE) 50 MCG/ACT nasal spray Place 2 sprays into both nostrils daily as needed for allergies or rhinitis. (Patient not taking: Reported on 07/05/2016) 16 g 6  . ipratropium (ATROVENT) 0.03 % nasal spray Place 2 sprays into both nostrils 3 (three) times daily as needed (congestion). (Patient not  taking: Reported on 07/05/2016) 30 mL 1   No facility-administered medications prior to visit.      Allergies:   Patient has no known allergies.   Social History   Social History  . Marital status: Widowed    Spouse name: N/A  . Number of children: 2  . Years of education: 12   Occupational History  . Retired [employer]   Social History Main Topics  . Smoking status: Never Smoker  . Smokeless tobacco: Never Used  . Alcohol use 2.4 - 3.0 oz/week    4 - 5 Cans of beer per week  . Drug use: No  . Sexual activity: Not Asked     Comment: Widowed   Other Topics Concern  . None   Social History Narrative   From [place], moving to [place]   Widowed   Right-handed   Caffeine: 1 cup of coffee per day     Family History:  The patient's family history includes Dementia in his sister; Heart attack in his father; Heart disease in his brother, father, paternal grandfather, and sister; Hypertension in his father; Parkinsonism in his sister; Stroke in his mother.   ROS:   Please see the history of present illness.    Arthritic pain in his back and right leg. Easy bruising. Chronic shortness of breath. PND, irregular heartbeat, joint swelling, and difficulty with balance and walking.  Wife recently died. He has moved to [place] to be near his daughter.  All other systems reviewed and are negative.   PHYSICAL EXAM:   VS:  BP 94/66 (BP Location: Left Arm)   Pulse (!) 52   Ht 6\' 3"  (1.905 m)   Wt 179 lb 12.8 oz (81.6 kg)   BMI 22.47 kg/m    GEN: Well nourished, well developed, in no acute distress  HEENT: normal  Neck: no JVD, carotid bruits, or masses Cardiac: IIRR; no murmurs, rubs, or gallops,no edema  Respiratory:  clear to auscultation bilaterally, normal work of breathing GI: soft, nontender, nondistended, + BS MS: no deformity or atrophy  Skin: warm and dry, no rash Neuro:  Alert and Oriented x 3, Strength and sensation are  intact Psych: euthymic mood, full affect  Wt Readings from Last 3 Encounters:  07/05/16 179 lb 12.8 oz (81.6 kg)  07/01/16 178 lb (80.7 kg)  06/18/16 174 lb (78.9 kg)      Studies/Labs Reviewed:   EKG:  EKG  Atrial fibrillation with poor rate control at 108 bpm, leftward axis, incomplete right bundle branch block, nonspecific ST-T wave abnormality. Since the tracing of 2016, the rate is slightly faster.  Recent Labs: 06/11/2016: ALT 18; Platelets 159 06/12/2016: BUN 21; Creatinine, Ser 1.18; Magnesium  2.2; Potassium 4.4; Sodium 140 07/01/2016: Hemoglobin 12.8   Lipid Panel No results found for: CHOL, TRIG, HDL, CHOLHDL, VLDL, LDLCALC, LDLDIRECT  Additional studies/ records that were reviewed today include:  No recent data.    ASSESSMENT:    1. Chronic systolic heart failure ([place])   2. Chronic atrial fibrillation (HCC)   3. Anticoagulated on Coumadin   4. Shortness of breath   5. Hypokalemia   6. Non-rheumatic mitral regurgitation      PLAN:  In order of problems listed above:  1. No recent data. EF 40-50% in 2013. 2. Presumed chronic atrial fibrillation. All EKGs and osseous system demonstrated atrial flutter. Rate is relatively slow. 3. Coumadin anticoagulation for stroke prevention. He will be set up in the Coumadin clinic. Chads Vasc is 3 or greater. 4. Dyspnea is chronic and multifactorial secondary to diastolic heart failure, physical deconditioning, H, and chronic lung disease. 5. He is on at least 2 medications that will contribute to hypokalemia which include furosemide and chronic glucocorticoid therapy. We'll plan to check a potassium and creatinine today and then make a recommendation concerning potassium supplementation. 6. Systolic murmur on exam is compatible with mitral regurgitation. This is likely chronic. We'll obtain information from his cardiologist in [place]. Of interest will be any recent echocardiographic data    Medication Adjustments/Labs  and Tests Ordered: Current medicines are reviewed at length with the patient today.  Concerns regarding medicines are outlined above.  Medication changes, Labs and Tests ordered today are listed in the Patient Instructions below. Patient Instructions  Medication Instructions:  None  Labwork: CMET, PT/INR today  Testing/Procedures: None  Follow-Up:  Your physician recommends that you schedule a follow-up appointment in: our Coumadin Clinic as soon as possible.    Your physician wants you to follow-up in: 6 months with Dr. Tamala Julian. You will receive a reminder letter in the mail two months in advance. If you don't receive a letter, please call our office to schedule the follow-up appointment.   Any Other Special Instructions Will Be Listed Below (If Applicable).     If you need a refill on your cardiac medications before your next appointment, please call your pharmacy.      Signed, Sinclair Grooms, MD  07/05/2016 12:33 PM    [facility] [address], [place], [place]  [address] Phone: [phone]; Fax: [phone]

## 2016-07-05 NOTE — Patient Instructions (Signed)
Medication Instructions:  None  Labwork: CMET, PT/INR today  Testing/Procedures: None  Follow-Up:  Your physician recommends that you schedule a follow-up appointment in: our Coumadin Clinic as soon as possible.    Your physician wants you to follow-up in: 6 months with Dr. Tamala Julian. You will receive a reminder letter in the mail two months in advance. If you don't receive a letter, please call our office to schedule the follow-up appointment.   Any Other Special Instructions Will Be Listed Below (If Applicable).     If you need a refill on your cardiac medications before your next appointment, please call your pharmacy.

## 2016-07-05 NOTE — Patient Instructions (Signed)

## 2016-07-05 NOTE — Addendum Note (Signed)
Addended by: Loren Racer on: 07/05/2016 12:38 PM   Modules accepted: Orders

## 2016-07-06 LAB — COMPREHENSIVE METABOLIC PANEL
ALK PHOS: 43 U/L (ref 40–115)
ALT: 12 U/L (ref 9–46)
AST: 21 U/L (ref 10–35)
Albumin: 3.5 g/dL — ABNORMAL LOW (ref 3.6–5.1)
BUN: 18 mg/dL (ref 7–25)
CO2: 27 mmol/L (ref 20–31)
Calcium: 8.6 mg/dL (ref 8.6–10.3)
Chloride: 102 mmol/L (ref 98–110)
Creat: 0.91 mg/dL (ref 0.70–1.11)
GLUCOSE: 112 mg/dL — AB (ref 65–99)
POTASSIUM: 4.1 mmol/L (ref 3.5–5.3)
Sodium: 141 mmol/L (ref 135–146)
Total Bilirubin: 0.9 mg/dL (ref 0.2–1.2)
Total Protein: 5.6 g/dL — ABNORMAL LOW (ref 6.1–8.1)

## 2016-07-08 ENCOUNTER — Other Ambulatory Visit (INDEPENDENT_AMBULATORY_CARE_PROVIDER_SITE_OTHER): Payer: Self-pay

## 2016-07-08 MED ORDER — SULFAMETHOXAZOLE-TRIMETHOPRIM 800-160 MG PO TABS: 1.0000 | ORAL_TABLET | Freq: Two times a day (BID) | ORAL | 0 refills | Status: DC

## 2016-07-08 NOTE — Progress Notes (Signed)
Per Dr. Sharol Given office called and the pt's toe had been cultured and +for staph to fax in rx for Bactrim DS to pharm and make appt for this week. Rx entered into module and called pt appt sch for Wednesday 07/10/16 at 12:45

## 2016-07-09 ENCOUNTER — Telehealth: Payer: Self-pay | Admitting: *Deleted

## 2016-07-09 ENCOUNTER — Telehealth: Payer: Self-pay | Admitting: Pharmacist

## 2016-07-09 ENCOUNTER — Telehealth (INDEPENDENT_AMBULATORY_CARE_PROVIDER_SITE_OTHER): Payer: Self-pay | Admitting: Orthopedic Surgery

## 2016-07-09 NOTE — Telephone Encounter (Signed)
-----   Message from [name], MD sent at 07/03/2016  3:11 PM EST ----- Regarding: appointment Hello Regino Schultze, Can we bring him in for an appointment in the [facility] for follow up of this cellulitis next week.  Thanks, Dr. Dareen Piano   ----- Message ----- From: Wardell Honour, MD Sent: 07/03/2016  12:05 PM To: [name], MD

## 2016-07-09 NOTE — Telephone Encounter (Signed)
Pt notified of lab results by phone with verbal understanding. Pt aware results to be faxed to Dr. Inda Merlin office. Pt thanked our office for being so kind and caring to him. I thanked pt for the kind words.

## 2016-07-09 NOTE — Telephone Encounter (Signed)
Thank you Bonnita Nasuti... Who is his PCP?

## 2016-07-09 NOTE — Telephone Encounter (Signed)
Pt is asking if he should stop taking a certain "high powered" medication... Kelflex? He stated he stated he was taking sulfameth as well and doesn't know if he should take both together.  [phone]

## 2016-07-09 NOTE — Telephone Encounter (Signed)
Have patient stop both antibiotics. He should not need them for his legs.

## 2016-07-09 NOTE — Telephone Encounter (Signed)
I called to advise patient he could discontinue both medications. He is going to keep taking until his appointment tomorrow he advised me, he says he has a lot of problems with that leg.

## 2016-07-09 NOTE — Telephone Encounter (Signed)
Tried calling patient, he is not home, will try calling again at a later time.

## 2016-07-09 NOTE — Telephone Encounter (Signed)
Per dr Dareen Piano called and made pt an appt thurs 12/14 in [facility] at 1015, spoke w/ pt and caregiver, stressed that before going to urg care during the day to please call [facility] for possible appt, caregiver said she did not realize they could do so and will start calling here first.

## 2016-07-09 NOTE — Telephone Encounter (Signed)
Pt called to report that he was started on Bactrim DS yesterday. Rescheduled INR check for Friday 07/12/16. Pt states understanding and appreciation.

## 2016-07-10 ENCOUNTER — Telehealth (INDEPENDENT_AMBULATORY_CARE_PROVIDER_SITE_OTHER): Payer: Self-pay | Admitting: Orthopedic Surgery

## 2016-07-10 ENCOUNTER — Ambulatory Visit (INDEPENDENT_AMBULATORY_CARE_PROVIDER_SITE_OTHER): Payer: Medicare Other | Admitting: Orthopedic Surgery

## 2016-07-10 ENCOUNTER — Encounter (INDEPENDENT_AMBULATORY_CARE_PROVIDER_SITE_OTHER): Payer: Self-pay | Admitting: Orthopedic Surgery

## 2016-07-10 VITALS — Ht 75.0 in | Wt 179.0 lb

## 2016-07-10 DIAGNOSIS — M869 Osteomyelitis, unspecified: Secondary | ICD-10-CM

## 2016-07-10 DIAGNOSIS — S8012XD Contusion of left lower leg, subsequent encounter: Secondary | ICD-10-CM

## 2016-07-10 NOTE — Telephone Encounter (Signed)
Spoke to dr Dareen Piano by phone

## 2016-07-10 NOTE — Progress Notes (Addendum)
Office Visit Note   Patient: Richard Bradley           Date of Birth: [birth year]           MRN: [id number] Visit Date: 07/10/2016              Requested by: Josetta Huddle, MD [address] [place], [place] [address] PCP: [name], MD   Assessment & Plan: Visit Diagnoses:  1. Toe osteomyelitis, left ([place])   2. Contusion of left lower extremity, subsequent encounter     Plan: Patient status post amputation of great toe which is healing well he has developed swelling cellulitis and ulcer with exposed bone of the second toe. Plan for left foot second toe amputation as an outpatient with an ankle block on Friday.  Continue with the Bactrim DS for the contusion of the left leg and continue with the compression sock. Patient states the cellulitis has resolved with the Bactrim from the contusion left calf. Return in about 3 weeks (around 07/31/2016).   Orders:  No orders of the defined types were placed in this encounter.  No orders of the defined types were placed in this encounter.     Procedures: No procedures performed   Clinical Data: No additional findings.   Subjective: Chief Complaint  Patient presents with  . Left Foot - Routine Post Op    06/11/16 s/p left foot second toe amputation     Pt is concerned because he had been advised to stop both of his ABX and pt and daughter are concerned why he was told to do this. 07/01/16 at an urgent care his GT amp site had been cultured and was positive for pseudomonas and was placed abx. The second toe is red and a little swollen.  There is 100% fibrinous tissue at the small open area and the pt is treating with a Vive sock.    Review of Systems   Objective: Vital Signs: Ht 6\' 3"  (1.905 m)   Wt 179 lb (81.2 kg)   BMI 22.37 kg/m   Physical Exam examination patient is alert oriented no adenopathy well-dressed normal affect. He has redness cellulitis and sausage digit swelling of the second toe left  foot the great toe amputation site is healing nicely. He has exposed bone of the second toe.  Of note patient has a superficial hematoma medial gastroc of the left leg secondary to blunt trauma. He has an ultrasound which was negative for DVT. The cellulitis is resolving nicely with the Bactrim DS and we will have the patient continue with the Bactrim DS.  Ortho Exam  Specialty Comments:  No specialty comments available.  Imaging: No results found.   PMFS History: Patient Active Problem List   Diagnosis Date Noted  . Contusion of left leg 07/10/2016  . Idiopathic chronic venous hypertension of both lower extremities with inflammation 07/02/2016  . Acquired absence of left great toe ([place]) 07/02/2016  . Acute cystitis without hematuria 07/01/2016  . Amputated toe, left ([place]) 06/18/2016  . Chest pain 06/12/2016  . Toe osteomyelitis, left ([place])   . Pyogenic inflammation of bone ([place]) 06/05/2016  . Onychomycosis 06/05/2016  . CAP (community acquired pneumonia) 05/25/2016  . Hypokalemia 05/25/2016  . Sepsis ([place]) 05/24/2016  . Chronic diastolic congestive heart failure ([place])   . Hypocalcemia 10/22/2015  . Influenza B 10/21/2015  . Sepsis due to Escherichia coli (E. coli) ([place]) 09/24/2015  . Chronic systolic heart failure ([place]) 04/30/2015  .  Sepsis due to Escherichia coli ([place]) 04/30/2015  . Urinary obstruction 04/25/2015  . Insomnia 08/12/2014  . Debilitated patient 05/31/2014  . Closed fracture of rib of left side 05/31/2014  . Blurry vision 02/20/2014  . Chronic steroid use 02/20/2014  . Leukocytosis 02/20/2014  . Supratherapeutic INR 02/20/2014  . Anticoagulated on Coumadin 12/20/2013  . Encounter for therapeutic drug monitoring 10/26/2013  . Low back pain 07/15/2013  . Disorder of sacrum 05/18/2013  . Hand joint pain 05/18/2013  . Abnormality of gait 04/13/2013  . 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 Seasonal allergic rhinitis 05/26/2012  . Shortness of breath  03/24/2012  . Acute upper respiratory infection 08/02/2011  . Dilated cardiomyopathy ([place]) 08/02/2011  . GERD (gastroesophageal reflux disease) 08/02/2011  . Myasthenia exacerbation ([place]) 06/27/2011  . Prostate cancer ([place])   . Chronic atrial fibrillation ([place])   . Osteoarthritis   . Height loss 05/24/2011  . 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 Senile osteoporosis 05/24/2011   Past Medical History:  Diagnosis Date  . Atrial fibrillation ([place])   . Cellulitis    left arm  . CHF (congestive heart failure) ([place])   . Constipation   . Dysrhythmia    hx of atrial fibrilation  . GERD (gastroesophageal reflux disease)    ocassional  . History of kidney stones   . Myasthenia gravis   . Osteoarthritis   . Pneumonia   . Prostate cancer ([place]) dx'd 1982  . Sepsis ([place])   . Shortness of breath dyspnea    with exertion  . Urine incontinence   . Varicose veins of left lower extremity     Family History  Problem Relation Age of Onset  . Heart disease Father   . Heart attack Father   . Hypertension Father   . Heart disease Brother   . Heart disease Paternal Grandfather   . Stroke Mother   . Parkinsonism Sister   . Heart disease Sister   . Dementia Sister     Past Surgical History:  Procedure Laterality Date  . AMPUTATION Left 03/20/2016   Procedure: Left Great Toe Amputation at Metatarsophalangeal Joint;  Surgeon: Newt Minion, MD;  Location: [place];  Service: Orthopedics;  Laterality: Left;  . AMPUTATION Left 06/11/2016   Procedure: Left 2nd Toe Amputation;  Surgeon: Newt Minion, MD;  Location: [place];  Service: Orthopedics;  Laterality: Left;  . appendectomy    . APPENDECTOMY    . COLONOSCOPY    . HERNIA REPAIR Bilateral    Inguinal  . I&D EXTREMITY Left 10/12/2014   Procedure: IRRIGATION AND DEBRIDEMENT INDEX FINGER;  Surgeon: Iran Planas, MD;  Location: [place];  Service: Orthopedics;  Laterality: Left;  . KYPHOPLASTY  2012 ?   T 12- L1  . LAMINECTOMY   2005 ?   lumbar- 4-5  . PROSTATE SURGERY    . ROTATOR CUFF REPAIR Right   . TOTAL KNEE ARTHROPLASTY Right    Social History   Occupational History  . Retired [employer]   Social History Main Topics  . Smoking status: Never Smoker  . Smokeless tobacco: Never Used  . Alcohol use 2.4 - 3.0 oz/week    4 - 5 Cans of beer per week  . Drug use: No  . Sexual activity: Not on file     Comment: Widowed

## 2016-07-10 NOTE — Telephone Encounter (Signed)
Patient was seen in office today accompanied by family. This was addressed with them at his appointment.

## 2016-07-10 NOTE — Telephone Encounter (Signed)
Patient called back again this morning to ask the same question he did yesterday. He did not remember the conversation he had with Georgina Peer yesterday about his new appt time since he's on Bactrim. Reminded him of appt time and date again. No further questions.

## 2016-07-11 ENCOUNTER — Ambulatory Visit: Payer: Medicare Other

## 2016-07-11 ENCOUNTER — Encounter (HOSPITAL_COMMUNITY): Payer: Self-pay | Admitting: *Deleted

## 2016-07-11 ENCOUNTER — Other Ambulatory Visit (INDEPENDENT_AMBULATORY_CARE_PROVIDER_SITE_OTHER): Payer: Self-pay | Admitting: Family

## 2016-07-11 ENCOUNTER — Ambulatory Visit (INDEPENDENT_AMBULATORY_CARE_PROVIDER_SITE_OTHER): Payer: Medicare Other | Admitting: *Deleted

## 2016-07-11 DIAGNOSIS — I482 Chronic atrial fibrillation, unspecified: Secondary | ICD-10-CM

## 2016-07-11 DIAGNOSIS — Z7901 Long term (current) use of anticoagulants: Secondary | ICD-10-CM | POA: Diagnosis not present

## 2016-07-11 DIAGNOSIS — Z5181 Encounter for therapeutic drug level monitoring: Secondary | ICD-10-CM

## 2016-07-11 LAB — POCT INR: INR: 2.4

## 2016-07-11 MED ORDER — WARFARIN SODIUM 5 MG PO TABS: ORAL_TABLET | ORAL | 3 refills | Status: DC

## 2016-07-11 NOTE — Progress Notes (Signed)
Pt denies SOB and chest pain but is under the care of Dr. Tamala Julian, Cardiology. Pre-op instructions given to both pt nurse, Basilio Cairo (with verbal  consent from pt) and pt Daughter and POA, Yusuf Pasternak. Daughter aware to hold vitamins. Both Katharine Look and Maskell verbalized understanding of all pre-op instructions. Anesthesia asked to review pt chest x ray dated 06/11/16. Left voice message with Baird Lyons to clarify pt order for consent; awaiting a return call.

## 2016-07-12 ENCOUNTER — Ambulatory Visit (HOSPITAL_COMMUNITY): Payer: Medicare Other | Admitting: Emergency Medicine

## 2016-07-12 ENCOUNTER — Encounter (HOSPITAL_COMMUNITY): Payer: Self-pay | Admitting: *Deleted

## 2016-07-12 ENCOUNTER — Encounter (HOSPITAL_COMMUNITY)
Admission: RE | Disposition: A | Discharge status: Home / Self Care | Payer: Self-pay | Source: Ambulatory Visit | Attending: Orthopedic Surgery

## 2016-07-12 ENCOUNTER — Ambulatory Visit (HOSPITAL_COMMUNITY)
Admission: RE | Admit: 2016-07-12 | Discharge: 2016-07-12 | Disposition: A | Discharge status: Home / Self Care | Payer: Medicare Other | Source: Ambulatory Visit | Attending: Orthopedic Surgery | Admitting: Orthopedic Surgery

## 2016-07-12 DIAGNOSIS — Z89412 Acquired absence of left great toe: Secondary | ICD-10-CM | POA: Insufficient documentation

## 2016-07-12 DIAGNOSIS — I4891 Unspecified atrial fibrillation: Secondary | ICD-10-CM | POA: Insufficient documentation

## 2016-07-12 DIAGNOSIS — Z7901 Long term (current) use of anticoagulants: Secondary | ICD-10-CM | POA: Insufficient documentation

## 2016-07-12 DIAGNOSIS — M869 Osteomyelitis, unspecified: Secondary | ICD-10-CM | POA: Diagnosis not present

## 2016-07-12 DIAGNOSIS — Z89422 Acquired absence of other left toe(s): Secondary | ICD-10-CM | POA: Diagnosis not present

## 2016-07-12 DIAGNOSIS — Z8546 Personal history of malignant neoplasm of prostate: Secondary | ICD-10-CM | POA: Insufficient documentation

## 2016-07-12 DIAGNOSIS — K219 Gastro-esophageal reflux disease without esophagitis: Secondary | ICD-10-CM | POA: Diagnosis not present

## 2016-07-12 DIAGNOSIS — Z79899 Other long term (current) drug therapy: Secondary | ICD-10-CM | POA: Insufficient documentation

## 2016-07-12 DIAGNOSIS — G7000 Myasthenia gravis without (acute) exacerbation: Secondary | ICD-10-CM | POA: Insufficient documentation

## 2016-07-12 DIAGNOSIS — I509 Heart failure, unspecified: Secondary | ICD-10-CM | POA: Insufficient documentation

## 2016-07-12 DIAGNOSIS — I739 Peripheral vascular disease, unspecified: Secondary | ICD-10-CM | POA: Diagnosis not present

## 2016-07-12 DIAGNOSIS — I110 Hypertensive heart disease with heart failure: Secondary | ICD-10-CM | POA: Diagnosis not present

## 2016-07-12 DIAGNOSIS — M868X7 Other osteomyelitis, ankle and foot: Secondary | ICD-10-CM | POA: Insufficient documentation

## 2016-07-12 HISTORY — PX: AMPUTATION: SHX166

## 2016-07-12 SURGERY — AMPUTATION DIGIT
Anesthesia: Regional | Laterality: Left

## 2016-07-12 MED ORDER — PROPOFOL 10 MG/ML IV BOLUS
INTRAVENOUS | Status: AC
  Filled 2016-07-12: qty 20

## 2016-07-12 MED ORDER — FENTANYL CITRATE (PF) 100 MCG/2ML IJ SOLN: 100.0000 ug | Freq: Once | INTRAMUSCULAR | Status: DC

## 2016-07-12 MED ORDER — LACTATED RINGERS IV SOLN
INTRAVENOUS | Status: DC
  Administered 2016-07-12: 08:00:00 via INTRAVENOUS

## 2016-07-12 MED ORDER — 0.9 % SODIUM CHLORIDE (POUR BTL) OPTIME
TOPICAL | Status: DC | PRN
  Administered 2016-07-12: 1000 mL

## 2016-07-12 MED ORDER — ROPIVACAINE HCL 7.5 MG/ML IJ SOLN
INTRAMUSCULAR | Status: DC | PRN
  Administered 2016-07-12: 20 mL via PERINEURAL

## 2016-07-12 MED ORDER — MIDAZOLAM HCL 2 MG/2ML IJ SOLN
2.0000 mg | Freq: Once | INTRAMUSCULAR | Status: AC
  Administered 2016-07-12: 1 mg via INTRAVENOUS

## 2016-07-12 MED ORDER — LIDOCAINE-EPINEPHRINE (PF) 1.5 %-1:200000 IJ SOLN
INTRAMUSCULAR | Status: DC | PRN
  Administered 2016-07-12: 20 mL

## 2016-07-12 MED ORDER — FENTANYL CITRATE (PF) 100 MCG/2ML IJ SOLN
INTRAMUSCULAR | Status: AC
  Filled 2016-07-12: qty 2

## 2016-07-12 MED ORDER — CHLORHEXIDINE GLUCONATE 4 % EX LIQD: 60.0000 mL | Freq: Once | CUTANEOUS | Status: DC

## 2016-07-12 MED ORDER — CEFAZOLIN SODIUM-DEXTROSE 2-4 GM/100ML-% IV SOLN
2.0000 g | INTRAVENOUS | Status: AC
  Administered 2016-07-12: 2 g via INTRAVENOUS
  Filled 2016-07-12: qty 100

## 2016-07-12 MED ORDER — MIDAZOLAM HCL 2 MG/2ML IJ SOLN
INTRAMUSCULAR | Status: AC
  Filled 2016-07-12: qty 2

## 2016-07-12 SURGICAL SUPPLY — 32 items
BLADE SURG 21 STRL SS (BLADE) ×3 IMPLANT
BNDG COHESIVE 4X5 TAN STRL (GAUZE/BANDAGES/DRESSINGS) ×3 IMPLANT
BNDG ESMARK 4X9 LF (GAUZE/BANDAGES/DRESSINGS) IMPLANT
BNDG GAUZE ELAST 4 BULKY (GAUZE/BANDAGES/DRESSINGS) ×3 IMPLANT
COVER SURGICAL LIGHT HANDLE (MISCELLANEOUS) ×6 IMPLANT
DRAPE U-SHAPE 47X51 STRL (DRAPES) ×3 IMPLANT
DRSG ADAPTIC 3X8 NADH LF (GAUZE/BANDAGES/DRESSINGS) ×3 IMPLANT
DRSG PAD ABDOMINAL 8X10 ST (GAUZE/BANDAGES/DRESSINGS) ×3 IMPLANT
DURAPREP 26ML APPLICATOR (WOUND CARE) ×3 IMPLANT
ELECT REM PT RETURN 9FT ADLT (ELECTROSURGICAL) ×3
ELECTRODE REM PT RTRN 9FT ADLT (ELECTROSURGICAL) ×1 IMPLANT
GAUZE SPONGE 4X4 12PLY STRL (GAUZE/BANDAGES/DRESSINGS) IMPLANT
GLOVE BIOGEL PI IND STRL 9 (GLOVE) ×1 IMPLANT
GLOVE BIOGEL PI INDICATOR 9 (GLOVE) ×2
GLOVE ECLIPSE 6.5 STRL STRAW (GLOVE) ×3 IMPLANT
GLOVE SURG ORTHO 9.0 STRL STRW (GLOVE) ×3 IMPLANT
GOWN STRL REUS W/ TWL XL LVL3 (GOWN DISPOSABLE) ×2 IMPLANT
GOWN STRL REUS W/TWL XL LVL3 (GOWN DISPOSABLE) ×4
KIT BASIN OR (CUSTOM PROCEDURE TRAY) ×3 IMPLANT
KIT ROOM TURNOVER OR (KITS) ×3 IMPLANT
MANIFOLD NEPTUNE II (INSTRUMENTS) IMPLANT
NEEDLE 22X1 1/2 (OR ONLY) (NEEDLE) IMPLANT
NS IRRIG 1000ML POUR BTL (IV SOLUTION) ×3 IMPLANT
PACK ORTHO EXTREMITY (CUSTOM PROCEDURE TRAY) ×3 IMPLANT
PAD ARMBOARD 7.5X6 YLW CONV (MISCELLANEOUS) ×6 IMPLANT
SPONGE GAUZE 4X4 12PLY STER LF (GAUZE/BANDAGES/DRESSINGS) ×3 IMPLANT
SUCTION FRAZIER HANDLE 10FR (MISCELLANEOUS)
SUCTION TUBE FRAZIER 10FR DISP (MISCELLANEOUS) IMPLANT
SUT ETHILON 2 0 PSLX (SUTURE) ×3 IMPLANT
SYR CONTROL 10ML LL (SYRINGE) IMPLANT
TOWEL OR 17X24 6PK STRL BLUE (TOWEL DISPOSABLE) ×3 IMPLANT
TOWEL OR 17X26 10 PK STRL BLUE (TOWEL DISPOSABLE) ×3 IMPLANT

## 2016-07-12 NOTE — H&P (Signed)
Richard Bradley is an 82 y.o. male.   Chief Complaint: Osteomyelitis ulceration left foot second toe HPI: Patient is a 82 year old gentleman with peripheral vascular disease status post great toe amputation who presents at this time with ulceration osteomyelitis of the second toe left foot  Past Medical History:  Diagnosis Date  . Atrial fibrillation ([place])   . Cellulitis    left arm  . CHF (congestive heart failure) ([place])   . Constipation   . Dysrhythmia    hx of atrial fibrilation  . GERD (gastroesophageal reflux disease)    ocassional  . History of kidney stones   . Myasthenia gravis   . Osteoarthritis   . Pneumonia   . Prostate cancer ([place]) dx'd 1982  . Sepsis ([place])   . Shortness of breath dyspnea    with exertion  . Urine incontinence   . Varicose veins of left lower extremity     Past Surgical History:  Procedure Laterality Date  . AMPUTATION Left 03/20/2016   Procedure: Left Great Toe Amputation at Metatarsophalangeal Joint;  Surgeon: Newt Minion, MD;  Location: [place];  Service: Orthopedics;  Laterality: Left;  . AMPUTATION Left 06/11/2016   Procedure: Left 2nd Toe Amputation;  Surgeon: Newt Minion, MD;  Location: [place];  Service: Orthopedics;  Laterality: Left;  . appendectomy    . APPENDECTOMY    . COLONOSCOPY    . HERNIA REPAIR Bilateral    Inguinal  . I&D EXTREMITY Left 10/12/2014   Procedure: IRRIGATION AND DEBRIDEMENT INDEX FINGER;  Surgeon: Iran Planas, MD;  Location: [place];  Service: Orthopedics;  Laterality: Left;  . KYPHOPLASTY  2012 ?   T 12- L1  . LAMINECTOMY  2005 ?   lumbar- 4-5  . PROSTATE SURGERY    . ROTATOR CUFF REPAIR Right   . TOTAL KNEE ARTHROPLASTY Right     Family History  Problem Relation Age of Onset  . Heart disease Father   . Heart attack Father   . Hypertension Father   . Heart disease Brother   . Heart disease Paternal Grandfather   . Stroke Mother   . Parkinsonism Sister   . Heart disease Sister   . Dementia Sister     Social History:  reports that he has never smoked. He has never used smokeless tobacco. He reports that he drinks alcohol. He reports that he does not use drugs.  Allergies:  Allergies  Allergen Reactions  . No Known Allergies     Medications Prior to Admission  Medication Sig Dispense Refill  . Cholecalciferol (VITAMIN D-3 PO) Take 2,000 Units by mouth daily.     Marland Kitchen FIBER PO Take 1 tablet by mouth 3 (three) times daily.     . fluticasone (FLONASE) 50 MCG/ACT nasal spray Place 2 sprays into both nostrils 2 (two) times daily.    . furosemide (LASIX) 20 MG tablet Take 40 mg once daily for 5 days. Then resume 20 mg twice daily. (Patient taking differently: Take 20 mg by mouth 2 (two) times daily. ) 60 tablet 1  . HYDROcodone-acetaminophen (NORCO) 10-325 MG per tablet Take 1 tablet by mouth every 5 (five) hours as needed for moderate pain.     . metoprolol tartrate (LOPRESSOR) 25 MG tablet Take 25 mg by mouth 2 (two) times daily.     . polyethylene glycol (MIRALAX / GLYCOLAX) packet Take 17 g by mouth at bedtime.    . predniSONE (DELTASONE) 10 MG tablet Take 2 tablets (20  mg total) by mouth daily. 180 tablet 3  . sulfamethoxazole-trimethoprim (BACTRIM DS,SEPTRA DS) 800-160 MG tablet Take 1 tablet by mouth 2 (two) times daily. 30 tablet 0  . vitamin B-12 (CYANOCOBALAMIN) 1000 MCG tablet Take 1,000 mcg by mouth daily.    Marland Kitchen escitalopram (LEXAPRO) 5 MG tablet Take 5 mg by mouth daily.  3  . warfarin (COUMADIN) 5 MG tablet Take as directed by coumadin clinic 30 tablet 3    Results for orders placed or performed in visit on 07/11/16 (from the past 48 hour(s))  POCT INR     Status: None   Collection Time: 07/11/16  9:55 AM  Result Value Ref Range   INR 2.4    No results found.  Review of Systems  All other systems reviewed and are negative.   There were no vitals taken for this visit. Physical Exam  Examination patient is alert oriented no adenopathy well-dressed normal affect normal  respiratory effort he does have an antalgic gait examination he has palpable dorsalis pedis pulses are swelling ulceration osteomyelitis with exposed bone of the left foot second toe Assessment/Plan Assessment: Peripheral vascular disease with osteomyelitis ulceration left foot second toe.  Plan: We'll plan for second toe amputation. Risk and benefits were discussed including infection neurovascular injury nonhealing the wound need for additional surgery. Before meals understands wishes to proceed at this time.  Newt Minion, MD 07/12/2016, 6:42 AM

## 2016-07-12 NOTE — Anesthesia Postprocedure Evaluation (Addendum)
Anesthesia Post Note  Patient: Richard Bradley  Procedure(s) Performed: Procedure(s) (LRB): LEFT 3rd TOE AMPUTATION (Left)  Patient location during evaluation: PACU Anesthesia Type: Regional Level of consciousness: awake and alert Pain management: pain level controlled Vital Signs Assessment: post-procedure vital signs reviewed and stable Respiratory status: spontaneous breathing, nonlabored ventilation, respiratory function stable and patient connected to nasal cannula oxygen Cardiovascular status: stable and blood pressure returned to baseline Anesthetic complications: no    Last Vitals:  Vitals:   07/12/16 0724 07/12/16 0915  BP: 119/86 126/78  Pulse: (!) 54 (!) 108  Resp: 18 18  Temp: 37.1 C 36.7 C    Last Pain:  Vitals:   07/12/16 0915  TempSrc:   PainSc: 0-No pain                 Richard Bradley EDWARD

## 2016-07-12 NOTE — Anesthesia Procedure Notes (Signed)
Anesthesia Regional Block:  Ankle block  Pre-Anesthetic Checklist: ,, timeout performed, Correct Patient, Correct Site, Correct Laterality, Correct Procedure, Correct Position, site marked, Risks and benefits discussed, pre-op evaluation,  Laterality: Left  Prep: chloraprep       Needles:   Needle Type: Other   (field block)   Needle Length: 4cm 4 cm Needle Gauge: 21 and 21 G    Additional Needles: Ankle block Narrative:  Start time: 07/12/2016 8:18 AM End time: 07/12/2016 8:26 AM Injection made incrementally with aspirations every 5 mL. Anesthesiologist: Lyndle Herrlich  Additional Notes: 20cc.5% Naropin 20cc 1.5% Xylo/epi Mixture for ankle block

## 2016-07-12 NOTE — Anesthesia Preprocedure Evaluation (Signed)
Anesthesia Evaluation  Patient identified by MRN, date of birth, ID band Patient awake    Reviewed: Allergy & Precautions, NPO status , Patient's Chart, lab work & pertinent test results, reviewed documented beta blocker date and time   History of Anesthesia Complications Negative for: history of anesthetic complications  Airway Mallampati: III  TM Distance: >3 FB   Mouth opening: Limited Mouth Opening Comment: Ankylosing spondylosis Dental  (+) Dental Advisory Given, Missing   Pulmonary neg pulmonary ROS,    breath sounds clear to auscultation       Cardiovascular hypertension, Pt. on home beta blockers and Pt. on medications (-) angina+ Peripheral Vascular Disease  + dysrhythmias Atrial Fibrillation  Rhythm:Irregular Rate:Normal  '13 ECHO: EF 50%, mild AI, mod MR   Neuro/Psych myasthenia gravis-controlled with prednisone    GI/Hepatic Neg liver ROS, GERD  Medicated and Controlled,  Endo/Other  negative endocrine ROS  Renal/GU negative Renal ROS     Musculoskeletal  (+) Arthritis , Osteoarthritis and steroids,    Abdominal   Peds  Hematology Coumadin   Anesthesia Other Findings   Reproductive/Obstetrics                             Anesthesia Physical  Anesthesia Plan  ASA: III  Anesthesia Plan: Regional   Post-op Pain Management:    Induction:   Airway Management Planned: Natural Airway and Nasal Cannula  Additional Equipment:   Intra-op Plan:   Post-operative Plan:   Informed Consent: I have reviewed the patients History and Physical, chart, labs and discussed the procedure including the risks, benefits and alternatives for the proposed anesthesia with the patient or authorized representative who has indicated his/her understanding and acceptance.   Dental advisory given  Plan Discussed with: CRNA and Surgeon  Anesthesia Plan Comments: (Plan routine monitors, ankle  block)        Anesthesia Quick Evaluation

## 2016-07-12 NOTE — Op Note (Signed)
07/12/2016  9:18 AM  PATIENT:  Richard Bradley    PRE-OPERATIVE DIAGNOSIS:  Osteomyelitis Left 3nd Toe  POST-OPERATIVE DIAGNOSIS:  Same  PROCEDURE:  LEFT 3rd TOE AMPUTATION Local tissue rearrangement for wound closure to by 3 cm   SURGEON:  Newt Minion, MD  PHYSICIAN ASSISTANT:None ANESTHESIA:   General  PREOPERATIVE INDICATIONS:  Richard Bradley is a  82 y.o. male with a diagnosis of Osteomyelitis Left 3nd Toe who failed conservative measures and elected for surgical management.    The risks benefits and alternatives were discussed with the patient preoperatively including but not limited to the risks of infection, bleeding, nerve injury, cardiopulmonary complications, the need for revision surgery, among others, and the patient was willing to proceed.  OPERATIVE IMPLANTS: None  OPERATIVE FINDINGS: Small petechial bleeding  OPERATIVE PROCEDURE: Patient brought the operating room after undergoing an ankle block his left lower extremity was then prepped using DuraPrep draped into a sterile field a timeout was called. A racquet incision was made around the ulcerative tissue and the infected toe with osteomyelitis. The toe was amputated through the third toe MTP joint. Electrocautery was used for hemostasis wound was irrigated with normal saline. Local tissue rearrangement was used to close the wound that was 2 x 3 cm secondary to excision of the ulcerative tissue. 2-0 nylon was used for wound closure a sterile dressing was applied patient was taken to PACU in stable condition.

## 2016-07-12 NOTE — Transfer of Care (Signed)
Immediate Anesthesia Transfer of Care Note  Patient: Richard Bradley  Procedure(s) Performed: Procedure(s): LEFT 3rd TOE AMPUTATION (Left)  Patient Location: PACU  Anesthesia Type:Regional  Level of Consciousness: awake, alert  and oriented  Airway & Oxygen Therapy: Patient Spontanous Breathing  Post-op Assessment: Report given to RN and Post -op Vital signs reviewed and stable  Post vital signs: Reviewed and stable  Last Vitals:  Vitals:   07/12/16 0724 07/12/16 0915  BP: 119/86 126/78  Pulse: (!) 54 (!) 108  Resp: 18 18  Temp: 37.1 C 36.7 C    Last Pain:  Vitals:   07/12/16 0915  TempSrc:   PainSc: (P) 0-No pain      Patients Stated Pain Goal: 3 ([id number])  Complications: No apparent anesthesia complications

## 2016-07-12 NOTE — Anesthesia Procedure Notes (Signed)
Procedure Name: MAC Date/Time: 07/12/2016 8:45 AM Performed by: Babs Bertin Pre-anesthesia Checklist: Patient identified, Emergency Drugs available, Suction available, Patient being monitored and Timeout performed Patient Re-evaluated:Patient Re-evaluated prior to inductionOxygen Delivery Method: Nasal cannula

## 2016-07-12 NOTE — Progress Notes (Signed)
Orthopedic Tech Progress Note Patient Details:  Richard Bradley May 04, 1926 [id number]  Ortho Devices Type of Ortho Device: Postop shoe/boot Ortho Device/Splint Interventions: Application   [name] 07/12/2016, 10:04 AM

## 2016-07-15 ENCOUNTER — Encounter (HOSPITAL_COMMUNITY): Payer: Self-pay | Admitting: Orthopedic Surgery

## 2016-07-16 NOTE — Addendum Note (Signed)
Addendum  created 07/16/16 1313 by Lyndle Herrlich, MD   Sign clinical note

## 2016-07-18 ENCOUNTER — Ambulatory Visit (INDEPENDENT_AMBULATORY_CARE_PROVIDER_SITE_OTHER): Payer: Medicare Other | Admitting: *Deleted

## 2016-07-18 DIAGNOSIS — Z7901 Long term (current) use of anticoagulants: Secondary | ICD-10-CM | POA: Diagnosis not present

## 2016-07-18 DIAGNOSIS — Z5181 Encounter for therapeutic drug level monitoring: Secondary | ICD-10-CM

## 2016-07-18 DIAGNOSIS — I482 Chronic atrial fibrillation, unspecified: Secondary | ICD-10-CM

## 2016-07-18 LAB — POCT INR: INR: 3

## 2016-07-26 ENCOUNTER — Telehealth (INDEPENDENT_AMBULATORY_CARE_PROVIDER_SITE_OTHER): Payer: Self-pay | Admitting: Orthopedic Surgery

## 2016-07-26 MED ORDER — SULFAMETHOXAZOLE-TRIMETHOPRIM 800-160 MG PO TABS: 1.0000 | ORAL_TABLET | Freq: Two times a day (BID) | ORAL | 0 refills | Status: DC

## 2016-07-26 NOTE — Telephone Encounter (Signed)
[name] patients daughter calling about patient. Serosanguinous drainage present same as before. Completed bactrim would like refill to get him to appointment on Wednesday. Ok per MD. Denies fever or chills or nausea. She will call office with any changes.

## 2016-07-30 ENCOUNTER — Ambulatory Visit (INDEPENDENT_AMBULATORY_CARE_PROVIDER_SITE_OTHER): Payer: Medicare Other | Admitting: Family

## 2016-07-30 ENCOUNTER — Ambulatory Visit (INDEPENDENT_AMBULATORY_CARE_PROVIDER_SITE_OTHER): Payer: Medicare Other | Admitting: Orthopedic Surgery

## 2016-07-30 ENCOUNTER — Encounter (INDEPENDENT_AMBULATORY_CARE_PROVIDER_SITE_OTHER): Payer: Self-pay | Admitting: Orthopedic Surgery

## 2016-07-30 VITALS — Ht 75.0 in | Wt 179.0 lb

## 2016-07-30 DIAGNOSIS — Z89422 Acquired absence of other left toe(s): Secondary | ICD-10-CM

## 2016-07-30 MED ORDER — NITROGLYCERIN 0.2 MG/HR TD PT24: 0.2000 mg | MEDICATED_PATCH | Freq: Every day | TRANSDERMAL | 12 refills | Status: DC

## 2016-07-30 NOTE — Progress Notes (Signed)
Office Visit Note   Patient: Richard Bradley           Date of Birth: 1925/09/23           MRN: [id number] Visit Date: 07/30/2016              Requested by: [name], MD [place], [place], [place] [id number] PCP: [name], MD   Assessment & Plan: Visit Diagnoses:  1. Acquired absence of other left toe(s) ([place])     Plan: Plan to follow-up in 1 week. We'll call in a prescription for nitroglycerin patch to try to improve the microcirculation he will continue with his Bactrim DS. Antibiotic ointment dressing changes discussed the importance of nonweightbearing on the forefoot.  Follow-Up Instructions: Return in about 1 week (around 08/06/2016).   Orders:  No orders of the defined types were placed in this encounter.  Meds ordered this encounter  Medications  . nitroGLYCERIN (NITRODUR - DOSED IN MG/24 HR) 0.2 mg/hr patch    Sig: Place 1 patch (0.2 mg total) onto the skin daily.    Dispense:  30 patch    Refill:  12      Procedures: No procedures performed   Clinical Data: No additional findings.   Subjective: Chief Complaint  Patient presents with  . Left Foot - Work Related Injury    07/10/16 left foot 3rd toe amputation    Patient presents follow-up status post great second and third toe amputation left foot. There is slight gaping of the wound. There is no cellulitis no gangrenous changes patient has been weightbearing in a postoperative shoe.    Review of Systems   Objective: Vital Signs: Ht 6\' 3"  (1.905 m)   Wt 179 lb (81.2 kg)   BMI 22.37 kg/m   Physical Exam on examination there is no redness no synovitis or slight dehiscence of the wound. There is some ischemic granulation tissue wound is gaped open 5 mm x 15 mm  Ortho Exam  Specialty Comments:  No specialty comments available.  Imaging: No results found.   PMFS History: Patient Active Problem List   Diagnosis Date Noted  . Contusion of left leg 07/10/2016    . Idiopathic chronic venous hypertension of both lower extremities with inflammation 07/02/2016  . Acquired absence of left great toe ([place]) 07/02/2016  . Acute cystitis without hematuria 07/01/2016  . Amputated toe, left ([place]) 06/18/2016  . Chest pain 06/12/2016  . Toe osteomyelitis, left ([place])   . Pyogenic inflammation of bone ([place]) 06/05/2016  . Onychomycosis 06/05/2016  . CAP (community acquired pneumonia) 05/25/2016  . Hypokalemia 05/25/2016  . Sepsis ([place]) 05/24/2016  . Chronic diastolic congestive heart failure ([place])   . Hypocalcemia 10/22/2015  . Influenza B 10/21/2015  . Sepsis due to Escherichia coli (E. coli) ([place]) 09/24/2015  . Chronic systolic heart failure ([place]) 04/30/2015  . Sepsis due to Escherichia coli ([place]) 04/30/2015  . Urinary obstruction 04/25/2015  . Insomnia 08/12/2014  . Debilitated patient 05/31/2014  . Closed fracture of rib of left side 05/31/2014  . Blurry vision 02/20/2014  . Chronic steroid use 02/20/2014  . Leukocytosis 02/20/2014  . Supratherapeutic INR 02/20/2014  . Anticoagulated on Coumadin 12/20/2013  . Encounter for therapeutic drug monitoring 10/26/2013  . Low back pain 07/15/2013  . Disorder of sacrum 05/18/2013  . Hand joint pain 05/18/2013  . Abnormality of gait 04/13/2013  . 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 Seasonal allergic rhinitis 05/26/2012  .  Shortness of breath 03/24/2012  . Acute upper respiratory infection 08/02/2011  . Dilated cardiomyopathy ([place]) 08/02/2011  . GERD (gastroesophageal reflux disease) 08/02/2011  . Myasthenia exacerbation ([place]) 06/27/2011  . Prostate cancer ([place])   . Chronic atrial fibrillation ([place])   . Osteoarthritis   . Height loss 05/24/2011  . 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 Senile osteoporosis 05/24/2011   Past Medical History:  Diagnosis Date  . Atrial fibrillation ([place])   . Cellulitis    left arm  . CHF (congestive heart failure) ([place])   . Constipation    . Dysrhythmia    hx of atrial fibrilation  . GERD (gastroesophageal reflux disease)    ocassional  . History of kidney stones   . Myasthenia gravis   . Osteoarthritis   . Pneumonia   . Prostate cancer ([place]) dx'd 1982  . Sepsis ([place])   . Shortness of breath dyspnea    with exertion  . Urine incontinence   . Varicose veins of left lower extremity     Family History  Problem Relation Age of Onset  . Heart disease Father   . Heart attack Father   . Hypertension Father   . Heart disease Brother   . Heart disease Paternal Grandfather   . Stroke Mother   . Parkinsonism Sister   . Heart disease Sister   . Dementia Sister     Past Surgical History:  Procedure Laterality Date  . AMPUTATION Left 03/20/2016   Procedure: Left Great Toe Amputation at Metatarsophalangeal Joint;  Surgeon: Newt Minion, MD;  Location: [facility];  Service: Orthopedics;  Laterality: Left;  . AMPUTATION Left 06/11/2016   Procedure: Left 2nd Toe Amputation;  Surgeon: Newt Minion, MD;  Location: [facility];  Service: Orthopedics;  Laterality: Left;  . AMPUTATION Left 07/12/2016   Procedure: LEFT 3rd TOE AMPUTATION;  Surgeon: Newt Minion, MD;  Location: [facility];  Service: Orthopedics;  Laterality: Left;  . appendectomy    . APPENDECTOMY    . COLONOSCOPY    . HERNIA REPAIR Bilateral    Inguinal  . I&D EXTREMITY Left 10/12/2014   Procedure: IRRIGATION AND DEBRIDEMENT INDEX FINGER;  Surgeon: Iran Planas, MD;  Location: [facility];  Service: Orthopedics;  Laterality: Left;  . KYPHOPLASTY  2012 ?   T 12- L1  . LAMINECTOMY  2005 ?   lumbar- 4-5  . PROSTATE SURGERY    . ROTATOR CUFF REPAIR Right   . TOTAL KNEE ARTHROPLASTY Right    Social History   Occupational History  . Retired [employer]   Social History Main Topics  . Smoking status: Never Smoker  . Smokeless tobacco: Never Used  . Alcohol use 0.0 oz/week     Comment: occasional beer  . Drug use: No  . Sexual activity: Not on file      Comment: Widowed

## 2016-07-31 ENCOUNTER — Ambulatory Visit (INDEPENDENT_AMBULATORY_CARE_PROVIDER_SITE_OTHER): Payer: Medicare Other | Admitting: Orthopedic Surgery

## 2016-08-01 ENCOUNTER — Ambulatory Visit (INDEPENDENT_AMBULATORY_CARE_PROVIDER_SITE_OTHER): Payer: Medicare Other

## 2016-08-01 ENCOUNTER — Ambulatory Visit (INDEPENDENT_AMBULATORY_CARE_PROVIDER_SITE_OTHER): Payer: Medicare Other | Admitting: Orthopedic Surgery

## 2016-08-01 DIAGNOSIS — I482 Chronic atrial fibrillation, unspecified: Secondary | ICD-10-CM

## 2016-08-01 DIAGNOSIS — Z5181 Encounter for therapeutic drug level monitoring: Secondary | ICD-10-CM

## 2016-08-01 DIAGNOSIS — Z7901 Long term (current) use of anticoagulants: Secondary | ICD-10-CM

## 2016-08-01 LAB — POCT INR: INR: 1.8

## 2016-08-06 ENCOUNTER — Encounter (INDEPENDENT_AMBULATORY_CARE_PROVIDER_SITE_OTHER): Payer: Self-pay | Admitting: Orthopedic Surgery

## 2016-08-06 ENCOUNTER — Ambulatory Visit (INDEPENDENT_AMBULATORY_CARE_PROVIDER_SITE_OTHER): Payer: Medicare Other | Admitting: Orthopedic Surgery

## 2016-08-06 VITALS — Ht 75.0 in | Wt 179.0 lb

## 2016-08-06 DIAGNOSIS — Z89422 Acquired absence of other left toe(s): Secondary | ICD-10-CM

## 2016-08-06 MED ORDER — NITROGLYCERIN 0.2 MG/HR TD PT24: 0.2000 mg | MEDICATED_PATCH | Freq: Every day | TRANSDERMAL | 12 refills | Status: DC

## 2016-08-06 NOTE — Progress Notes (Signed)
Office Visit Note   Patient: Richard Bradley           Date of Birth: 27-May-1926           MRN: [id number] Visit Date: 08/06/2016              Requested by: [name], MD [place], [place], [place] [address] PCP: [name], MD  Chief Complaint  Patient presents with  . Left Foot - Routine Post Op    07/10/16 left foot 3rd toe amputation      HPI: Patient is 2.5 weeks s/p a left foot third toe amputation. Patient is applying a nitro patch to the foot and abx oint to the incision. The area is macerated and has a small amount of yellow drainage. The foot is slightly swollen and appears a little pink. The pt is taking Bactrim DS and is weight bearing with a walker in a post op shoe. No questions or concerns today. Pamella Pert, RMA  Patient is pleased with his progress.  Assessment & Plan: Visit Diagnoses:  1. Acquired absence of other left toe(s) ([place])     Plan: Follow-up in 4 weeks. Continue nitroglycerin patch a new prescription was called and continue with antibiotic ointment continue with protected weightbearing sutures harvested today.  Follow-Up Instructions: Return in about 4 weeks (around 09/03/2016).   Ortho Exam Examination the wound depth is decreasing the current wound is 1 mm deep and has good granulation tissue there is no redness no cellulitis no drainage no odor no signs of infection.  Imaging: No results found.  Orders:  No orders of the defined types were placed in this encounter.  Meds ordered this encounter  Medications  . nitroGLYCERIN (NITRODUR - DOSED IN MG/24 HR) 0.2 mg/hr patch    Sig: Place 1 patch (0.2 mg total) onto the skin daily.    Dispense:  30 patch    Refill:  12     Procedures: No procedures performed  Clinical Data: No additional findings.  Subjective: Review of Systems  Objective: Vital Signs: Ht 6\' 3"  (1.905 m)   Wt 179 lb (81.2 kg)   BMI 22.37 kg/m   Specialty Comments:  No  specialty comments available.  PMFS History: Patient Active Problem List   Diagnosis Date Noted  . Contusion of left leg 07/10/2016  . Idiopathic chronic venous hypertension of both lower extremities with inflammation 07/02/2016  . Acquired absence of other left toe(s) ([place]) 07/02/2016  . Acute cystitis without hematuria 07/01/2016  . Amputated toe, left ([place]) 06/18/2016  . Chest pain 06/12/2016  . Toe osteomyelitis, left ([place])   . Pyogenic inflammation of bone ([place]) 06/05/2016  . Onychomycosis 06/05/2016  . CAP (community acquired pneumonia) 05/25/2016  . Hypokalemia 05/25/2016  . Sepsis ([place]) 05/24/2016  . Chronic diastolic congestive heart failure ([place])   . Hypocalcemia 10/22/2015  . Influenza B 10/21/2015  . Sepsis due to Escherichia coli (E. coli) ([place]) 09/24/2015  . Chronic systolic heart failure ([place]) 04/30/2015  . Sepsis due to Escherichia coli ([place]) 04/30/2015  . Urinary obstruction 04/25/2015  . Insomnia 08/12/2014  . Debilitated patient 05/31/2014  . Closed fracture of rib of left side 05/31/2014  . Blurry vision 02/20/2014  . Chronic steroid use 02/20/2014  . Leukocytosis 02/20/2014  . Supratherapeutic INR 02/20/2014  . Anticoagulated on Coumadin 12/20/2013  . Encounter for therapeutic drug monitoring 10/26/2013  . Low back pain 07/15/2013  . Disorder of sacrum 05/18/2013  .  Hand joint pain 05/18/2013  . Abnormality of gait 04/13/2013  . 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 Seasonal allergic rhinitis 05/26/2012  . Shortness of breath 03/24/2012  . Acute upper respiratory infection 08/02/2011  . Dilated cardiomyopathy ([place]) 08/02/2011  . GERD (gastroesophageal reflux disease) 08/02/2011  . Myasthenia exacerbation ([place]) 06/27/2011  . Prostate cancer ([place])   . Chronic atrial fibrillation ([place])   . Osteoarthritis   . Height loss 05/24/2011  . 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 Senile osteoporosis 05/24/2011   Past Medical  History:  Diagnosis Date  . Atrial fibrillation ([place])   . Cellulitis    left arm  . CHF (congestive heart failure) ([place])   . Constipation   . Dysrhythmia    hx of atrial fibrilation  . GERD (gastroesophageal reflux disease)    ocassional  . History of kidney stones   . Myasthenia gravis   . Osteoarthritis   . Pneumonia   . Prostate cancer ([place]) dx'd 1982  . Sepsis ([place])   . Shortness of breath dyspnea    with exertion  . Urine incontinence   . Varicose veins of left lower extremity     Family History  Problem Relation Age of Onset  . Heart disease Father   . Heart attack Father   . Hypertension Father   . Heart disease Brother   . Heart disease Paternal Grandfather   . Stroke Mother   . Parkinsonism Sister   . Heart disease Sister   . Dementia Sister     Past Surgical History:  Procedure Laterality Date  . AMPUTATION Left 03/20/2016   Procedure: Left Great Toe Amputation at Metatarsophalangeal Joint;  Surgeon: Newt Minion, MD;  Location: [facility];  Service: Orthopedics;  Laterality: Left;  . AMPUTATION Left 06/11/2016   Procedure: Left 2nd Toe Amputation;  Surgeon: Newt Minion, MD;  Location: [facility];  Service: Orthopedics;  Laterality: Left;  . AMPUTATION Left 07/12/2016   Procedure: LEFT 3rd TOE AMPUTATION;  Surgeon: Newt Minion, MD;  Location: [facility];  Service: Orthopedics;  Laterality: Left;  . appendectomy    . APPENDECTOMY    . COLONOSCOPY    . HERNIA REPAIR Bilateral    Inguinal  . I&D EXTREMITY Left 10/12/2014   Procedure: IRRIGATION AND DEBRIDEMENT INDEX FINGER;  Surgeon: Iran Planas, MD;  Location: [facility];  Service: Orthopedics;  Laterality: Left;  . KYPHOPLASTY  2012 ?   T 12- L1  . LAMINECTOMY  2005 ?   lumbar- 4-5  . PROSTATE SURGERY    . ROTATOR CUFF REPAIR Right   . TOTAL KNEE ARTHROPLASTY Right    Social History   Occupational History  . Retired [employer]   Social History Main Topics  . Smoking status: Never  Smoker  . Smokeless tobacco: Never Used  . Alcohol use 0.0 oz/week     Comment: occasional beer  . Drug use: No  . Sexual activity: Not on file     Comment: Widowed

## 2016-08-12 ENCOUNTER — Ambulatory Visit (INDEPENDENT_AMBULATORY_CARE_PROVIDER_SITE_OTHER): Payer: Medicare Other | Admitting: Orthopaedic Surgery

## 2016-08-12 ENCOUNTER — Encounter (INDEPENDENT_AMBULATORY_CARE_PROVIDER_SITE_OTHER): Payer: Self-pay | Admitting: Orthopaedic Surgery

## 2016-08-12 VITALS — BP 118/74 | HR 112 | Ht 75.0 in | Wt 179.0 lb

## 2016-08-12 DIAGNOSIS — T8131XA Disruption of external operation (surgical) wound, not elsewhere classified, initial encounter: Secondary | ICD-10-CM

## 2016-08-12 NOTE — Progress Notes (Signed)
Office Visit Note   Patient: Richard Bradley           Date of Birth: 06-11-1926           MRN: [id number] Visit Date: 08/12/2016              Requested by: [name], MD [place], [place], [place] [address] PCP: [name], MD   Assessment & Plan: Visit Diagnoses:  1. Postoperative wound dehiscence, initial encounter   3rd toe amputation on 07/12/16.   Plan: Office follow-up with Dr. Sharol Given next week to amputated his other 2 toes. After sutures removed his wound has dehisced. He is keeping his foot elevated. Silvadene and Bactroban are being used. I discussed with them that he may require possible trans-met amputation if he does not heal.  Follow-Up Instructions: No Follow-up on file.   Orders:  No orders of the defined types were placed in this encounter.  No orders of the defined types were placed in this encounter.     Procedures: No procedures performed   Clinical Data: No additional findings.   Subjective: Chief Complaint  Patient presents with  . Left Foot - Wound Check    Patient returns for wound check. He is status post left third toe amputation on 07/12/2016 by Dr. Sharol Given. He saw Dr. Sharol Given on Thursday, and his foot looked good. Follow up scheduled for four weeks. This past weekend, his daughter dressed his foot and did not think that it looked that good and wanted him to follow up. Per caretaker here with him today, wound is being dressed with antibiotic cream and silvadene. Patient also sleeps with nitroglycerin patch on top of foot. There is no increased drainage or smell to wound.    Review of Systems 14 point review systems unchanged from hospitalization last month.   Objective: Vital Signs: BP 118/74   Pulse (!) 112   Ht 6' 3"  (1.905 m)   Wt 179 lb (81.2 kg)   BMI 22.37 kg/m   Physical Exam patient said the dehiscence of his wound after sutures were removed. He has a 2 cm gap. He has some clear drainage. Silvadene and  Bactroban have been used. There is no cellulitis currently.  Ortho Exam  Specialty Comments:  No specialty comments available.  Imaging: No results found.   PMFS History: Patient Active Problem List   Diagnosis Date Noted  . Contusion of left leg 07/10/2016  . Idiopathic chronic venous hypertension of both lower extremities with inflammation 07/02/2016  . Acquired absence of other left toe(s) ([place]) 07/02/2016  . Acute cystitis without hematuria 07/01/2016  . Amputated toe, left ([place]) 06/18/2016  . Chest pain 06/12/2016  . Toe osteomyelitis, left ([place])   . Pyogenic inflammation of bone ([place]) 06/05/2016  . Onychomycosis 06/05/2016  . CAP (community acquired pneumonia) 05/25/2016  . Hypokalemia 05/25/2016  . Sepsis ([place]) 05/24/2016  . Chronic diastolic congestive heart failure ([place])   . Hypocalcemia 10/22/2015  . Influenza B 10/21/2015  . Sepsis due to Escherichia coli (E. coli) ([place]) 09/24/2015  . Chronic systolic heart failure ([place]) 04/30/2015  . Sepsis due to Escherichia coli ([place]) 04/30/2015  . Urinary obstruction 04/25/2015  . Insomnia 08/12/2014  . Debilitated patient 05/31/2014  . Closed fracture of rib of left side 05/31/2014  . Blurry vision 02/20/2014  . Chronic steroid use 02/20/2014  . Leukocytosis 02/20/2014  . Supratherapeutic INR 02/20/2014  . Anticoagulated on Coumadin 12/20/2013  . Encounter for therapeutic  drug monitoring 10/26/2013  . Low back pain 07/15/2013  . Disorder of sacrum 05/18/2013  . Hand joint pain 05/18/2013  . Abnormality of gait 04/13/2013  . 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 Seasonal allergic rhinitis 05/26/2012  . Shortness of breath 03/24/2012  . Acute upper respiratory infection 08/02/2011  . Dilated cardiomyopathy ([place]) 08/02/2011  . GERD (gastroesophageal reflux disease) 08/02/2011  . Myasthenia exacerbation ([place]) 06/27/2011  . Prostate cancer ([place])   . Chronic atrial fibrillation ([place])   . Osteoarthritis   .  Height loss 05/24/2011  . 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 Senile osteoporosis 05/24/2011   Past Medical History:  Diagnosis Date  . Atrial fibrillation ([place])   . Cellulitis    left arm  . CHF (congestive heart failure) ([place])   . Constipation   . Dysrhythmia    hx of atrial fibrilation  . GERD (gastroesophageal reflux disease)    ocassional  . History of kidney stones   . Myasthenia gravis   . Osteoarthritis   . Pneumonia   . Prostate cancer ([place]) dx'd 1982  . Sepsis ([place])   . Shortness of breath dyspnea    with exertion  . Urine incontinence   . Varicose veins of left lower extremity     Family History  Problem Relation Age of Onset  . Heart disease Father   . Heart attack Father   . Hypertension Father   . Heart disease Brother   . Heart disease Paternal Grandfather   . Stroke Mother   . Parkinsonism Sister   . Heart disease Sister   . Dementia Sister     Past Surgical History:  Procedure Laterality Date  . AMPUTATION Left 03/20/2016   Procedure: Left Great Toe Amputation at Metatarsophalangeal Joint;  Surgeon: Newt Minion, MD;  Location: [facility];  Service: Orthopedics;  Laterality: Left;  . AMPUTATION Left 06/11/2016   Procedure: Left 2nd Toe Amputation;  Surgeon: Newt Minion, MD;  Location: [facility];  Service: Orthopedics;  Laterality: Left;  . AMPUTATION Left 07/12/2016   Procedure: LEFT 3rd TOE AMPUTATION;  Surgeon: Newt Minion, MD;  Location: [facility];  Service: Orthopedics;  Laterality: Left;  . appendectomy    . APPENDECTOMY    . COLONOSCOPY    . HERNIA REPAIR Bilateral    Inguinal  . I&D EXTREMITY Left 10/12/2014   Procedure: IRRIGATION AND DEBRIDEMENT INDEX FINGER;  Surgeon: Iran Planas, MD;  Location: [facility];  Service: Orthopedics;  Laterality: Left;  . KYPHOPLASTY  2012 ?   T 12- L1  . LAMINECTOMY  2005 ?   lumbar- 4-5  . PROSTATE SURGERY    . ROTATOR CUFF REPAIR Right   . TOTAL KNEE ARTHROPLASTY Right    Social History    Occupational History  . Retired [employer]   Social History Main Topics  . Smoking status: Never Smoker  . Smokeless tobacco: Never Used  . Alcohol use 0.0 oz/week     Comment: occasional beer  . Drug use: No  . Sexual activity: Not on file     Comment: Widowed

## 2016-08-17 ENCOUNTER — Ambulatory Visit (INDEPENDENT_AMBULATORY_CARE_PROVIDER_SITE_OTHER): Payer: Medicare Other | Admitting: Orthopedic Surgery

## 2016-08-17 ENCOUNTER — Encounter (INDEPENDENT_AMBULATORY_CARE_PROVIDER_SITE_OTHER): Payer: Self-pay | Admitting: Orthopedic Surgery

## 2016-08-17 DIAGNOSIS — M869 Osteomyelitis, unspecified: Secondary | ICD-10-CM

## 2016-08-17 NOTE — Progress Notes (Signed)
Office Visit Note   Patient: Richard Bradley           Date of Birth: 1926-04-21           MRN: [id number] Visit Date: 08/17/2016              Requested by: [name], MD [place], [place], [place] [address] PCP: [name], MD  Chief Complaint  Patient presents with  . Left Foot - Routine Post Op    [id number] dehiscence second third toe amputation left foot. HPI  Assessment & Plan: Visit Diagnoses:  1. Toe osteomyelitis, left (H[facility])     Plan: Plan to continue to pack the wound open with Silvadene plus gauze. Continue protected weightbearing. Continue nitroglycerin patches. Patient has been on oral antibiotics for 6 weeks and we'll hold on antibiotics at this time.  Follow-Up Instructions: Return in about 3 weeks (around 09/07/2016).   Ortho Exam Examination the wound has broken down it is approximately 1 x 2 cm in diameter and 1 cm deep. There is good granulation tissue along the wound edges. There is some redness in his foot but this is asymptomatic there is no cellulitis no signs of infection no drainage no odor.  Imaging: No results found.  Orders:  No orders of the defined types were placed in this encounter.  No orders of the defined types were placed in this encounter.    Procedures: No procedures performed  Clinical Data: No additional findings.  Subjective: Review of Systems  Objective: Vital Signs: There were no vitals taken for this visit.  Specialty Comments:  No specialty comments available.  PMFS History: Patient Active Problem List   Diagnosis Date Noted  . Contusion of left leg 07/10/2016  . Idiopathic chronic venous hypertension of both lower extremities with inflammation 07/02/2016  . Acquired absence of other left toe(s) ([place]) 07/02/2016  . Acute cystitis without hematuria 07/01/2016  . Amputated toe, left ([place]) 06/18/2016  . Chest pain 06/12/2016  . Toe osteomyelitis, left ([place])   . Pyogenic inflammation  of bone ([place]) 06/05/2016  . Onychomycosis 06/05/2016  . CAP (community acquired pneumonia) 05/25/2016  . Hypokalemia 05/25/2016  . Sepsis ([place]) 05/24/2016  . Chronic diastolic congestive heart failure ([place])   . Hypocalcemia 10/22/2015  . Influenza B 10/21/2015  . Sepsis due to Escherichia coli (E. coli) ([place]) 09/24/2015  . Chronic systolic heart failure ([place]) 04/30/2015  . Sepsis due to Escherichia coli ([place]) 04/30/2015  . Urinary obstruction 04/25/2015  . Insomnia 08/12/2014  . Debilitated patient 05/31/2014  . Closed fracture of rib of left side 05/31/2014  . Blurry vision 02/20/2014  . Chronic steroid use 02/20/2014  . Leukocytosis 02/20/2014  . Supratherapeutic INR 02/20/2014  . Anticoagulated on Coumadin 12/20/2013  . Encounter for therapeutic drug monitoring 10/26/2013  . Low back pain 07/15/2013  . Disorder of sacrum 05/18/2013  . Hand joint pain 05/18/2013  . Abnormality of gait 04/13/2013  . 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 Seasonal allergic rhinitis 05/26/2012  . Shortness of breath 03/24/2012  . Acute upper respiratory infection 08/02/2011  . Dilated cardiomyopathy ([place]) 08/02/2011  . GERD (gastroesophageal reflux disease) 08/02/2011  . Myasthenia exacerbation ([place]) 06/27/2011  . Prostate cancer ([place])   . Chronic atrial fibrillation ([place])   . Osteoarthritis   . Height loss 05/24/2011  . 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 Senile osteoporosis 05/24/2011   Past Medical History:  Diagnosis Date  . Atrial fibrillation ([place])   .  Cellulitis    left arm  . CHF (congestive heart failure) ([place])   . Constipation   . Dysrhythmia    hx of atrial fibrilation  . GERD (gastroesophageal reflux disease)    ocassional  . History of kidney stones   . Myasthenia gravis   . Osteoarthritis   . Pneumonia   . Prostate cancer ([place]) dx'd 1982  . Sepsis ([place])   . Shortness of breath dyspnea    with exertion  . Urine incontinence   . Varicose  veins of left lower extremity     Family History  Problem Relation Age of Onset  . Heart disease Father   . Heart attack Father   . Hypertension Father   . Heart disease Brother   . Heart disease Paternal Grandfather   . Stroke Mother   . Parkinsonism Sister   . Heart disease Sister   . Dementia Sister     Past Surgical History:  Procedure Laterality Date  . AMPUTATION Left 03/20/2016   Procedure: Left Great Toe Amputation at Metatarsophalangeal Joint;  Surgeon: Newt Minion, MD;  Location: [facility];  Service: Orthopedics;  Laterality: Left;  . AMPUTATION Left 06/11/2016   Procedure: Left 2nd Toe Amputation;  Surgeon: Newt Minion, MD;  Location: [facility];  Service: Orthopedics;  Laterality: Left;  . AMPUTATION Left 07/12/2016   Procedure: LEFT 3rd TOE AMPUTATION;  Surgeon: Newt Minion, MD;  Location: [facility];  Service: Orthopedics;  Laterality: Left;  . appendectomy    . APPENDECTOMY    . COLONOSCOPY    . HERNIA REPAIR Bilateral    Inguinal  . I&D EXTREMITY Left 10/12/2014   Procedure: IRRIGATION AND DEBRIDEMENT INDEX FINGER;  Surgeon: Iran Planas, MD;  Location: [facility];  Service: Orthopedics;  Laterality: Left;  . KYPHOPLASTY  2012 ?   T 12- L1  . LAMINECTOMY  2005 ?   lumbar- 4-5  . PROSTATE SURGERY    . ROTATOR CUFF REPAIR Right   . TOTAL KNEE ARTHROPLASTY Right    Social History   Occupational History  . Retired [employer]   Social History Main Topics  . Smoking status: Never Smoker  . Smokeless tobacco: Never Used  . Alcohol use 0.0 oz/week     Comment: occasional beer  . Drug use: No  . Sexual activity: Not on file     Comment: Widowed

## 2016-08-19 ENCOUNTER — Telehealth (INDEPENDENT_AMBULATORY_CARE_PROVIDER_SITE_OTHER): Payer: Self-pay | Admitting: Orthopedic Surgery

## 2016-08-19 ENCOUNTER — Ambulatory Visit (INDEPENDENT_AMBULATORY_CARE_PROVIDER_SITE_OTHER): Payer: Medicare Other | Admitting: Orthopedic Surgery

## 2016-08-19 NOTE — Telephone Encounter (Signed)
Call back. Would give this several months to see if it should heal on its own. If this does not heal patient would require an amputation through the middle of his foot. Patient would still be able to ambulate with a midfoot amputation. At this time there is no sign of infection however this is a issued to follow closely.

## 2016-08-19 NOTE — Telephone Encounter (Signed)
Please see message below. You saw this pt on Saturday

## 2016-08-19 NOTE — Telephone Encounter (Signed)
Patient's daughter [name]) called to ask Dr. Sharol Given does he think the WD will heal and if he has any concerns about sepsis in the long hall. [name] also asked what is her father's next level of amputation? The number to contact [name] is [phone] Cell phone.

## 2016-08-19 NOTE — Telephone Encounter (Signed)
I called and sw daughter to advise of the message below. She voiced understanding and will call back with any questions.

## 2016-08-22 ENCOUNTER — Ambulatory Visit (INDEPENDENT_AMBULATORY_CARE_PROVIDER_SITE_OTHER): Payer: Medicare Other | Admitting: *Deleted

## 2016-08-22 DIAGNOSIS — I482 Chronic atrial fibrillation, unspecified: Secondary | ICD-10-CM

## 2016-08-22 DIAGNOSIS — Z5181 Encounter for therapeutic drug level monitoring: Secondary | ICD-10-CM | POA: Diagnosis not present

## 2016-08-22 DIAGNOSIS — Z7901 Long term (current) use of anticoagulants: Secondary | ICD-10-CM | POA: Diagnosis not present

## 2016-08-22 LAB — POCT INR: INR: 2.1

## 2016-08-27 ENCOUNTER — Ambulatory Visit: Payer: Medicare Other | Admitting: Internal Medicine

## 2016-09-03 ENCOUNTER — Other Ambulatory Visit (INDEPENDENT_AMBULATORY_CARE_PROVIDER_SITE_OTHER): Payer: Self-pay | Admitting: Family

## 2016-09-03 ENCOUNTER — Ambulatory Visit (INDEPENDENT_AMBULATORY_CARE_PROVIDER_SITE_OTHER): Payer: Medicare Other | Admitting: Orthopedic Surgery

## 2016-09-03 DIAGNOSIS — T8131XS Disruption of external operation (surgical) wound, not elsewhere classified, sequela: Secondary | ICD-10-CM

## 2016-09-03 DIAGNOSIS — M869 Osteomyelitis, unspecified: Secondary | ICD-10-CM

## 2016-09-03 NOTE — Progress Notes (Signed)
Office Visit Note   Patient: Richard Bradley           Date of Birth: 07-Nov-1925           MRN: [id number] Visit Date: 09/03/2016              Requested by: [name], MD [address], [place], [place] [id number] PCP: [name], MD  Chief Complaint  Patient presents with  . Left Foot - Routine Post Op, Wound Check    HPI: The patient is an 83 year old gentleman who is status post left second and third toe amputations. Did have wound dehiscence. Has been packing the wound open with gauze. Home health aide accompanies the visit. They state there is exudative tissue in the wound.    Assessment & Plan: Visit Diagnoses:  1. Toe osteomyelitis, left ([place])   2. Postoperative wound dehiscence, sequela     Plan: With progressive dehiscence and exposed second and third metatarsal heads we'll plan for revision amputation with a transmetatarsal amputation. We'll plan for surgery on Wednesday. 23 hour observation.  With the patient's palpable pulse and strong biphasic flow I do not think that vascular intervention would be necessary he has good circulation down to the ankle.  Follow-Up Instructions: Return in about 2 weeks (around 09/17/2016).   Ortho Exam Examination patient is alert oriented no adenopathy well-dressed there is no cellulitis in the foot. Patient has  a palpable posterior tibial and dorsalis pedis pulse the Doppler was used and he has a strong biphasic pulse with a irregular rate consistent with his atrial fibrillation.  Imaging: No results found.  Orders:  No orders of the defined types were placed in this encounter.  No orders of the defined types were placed in this encounter.    Procedures: No procedures performed  Clinical Data: No additional findings.  Subjective: Review of Systems  Objective: Vital Signs: There were no vitals taken for this visit.  Specialty Comments:  No specialty comments available.  PMFS  History: Patient Active Problem List   Diagnosis Date Noted  . Wound dehiscence, surgical 09/03/2016  . Contusion of left leg 07/10/2016  . Idiopathic chronic venous hypertension of both lower extremities with inflammation 07/02/2016  . Acquired absence of other left toe(s) ([place]) 07/02/2016  . Acute cystitis without hematuria 07/01/2016  . Amputated toe, left ([place]) 06/18/2016  . Chest pain 06/12/2016  . Toe osteomyelitis, left ([place])   . Pyogenic inflammation of bone ([place]) 06/05/2016  . Onychomycosis 06/05/2016  . CAP (community acquired pneumonia) 05/25/2016  . Hypokalemia 05/25/2016  . Sepsis ([place]) 05/24/2016  . Chronic diastolic congestive heart failure ([place])   . Hypocalcemia 10/22/2015  . Influenza B 10/21/2015  . Sepsis due to Escherichia coli (E. coli) ([place]) 09/24/2015  . Chronic systolic heart failure ([place]) 04/30/2015  . Sepsis due to Escherichia coli ([place]) 04/30/2015  . Urinary obstruction 04/25/2015  . Insomnia 08/12/2014  . Debilitated patient 05/31/2014  . Closed fracture of rib of left side 05/31/2014  . Blurry vision 02/20/2014  . Chronic steroid use 02/20/2014  . Leukocytosis 02/20/2014  . Supratherapeutic INR 02/20/2014  . Anticoagulated on Coumadin 12/20/2013  . Encounter for therapeutic drug monitoring 10/26/2013  . Low back pain 07/15/2013  . Disorder of sacrum 05/18/2013  . Hand joint pain 05/18/2013  . Abnormality of gait 04/13/2013  . 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 Seasonal allergic rhinitis 05/26/2012  . Shortness of breath 03/24/2012  . Acute upper respiratory  infection 08/02/2011  . Dilated cardiomyopathy ([place]) 08/02/2011  . GERD (gastroesophageal reflux disease) 08/02/2011  . Myasthenia exacerbation ([place]) 06/27/2011  . Prostate cancer ([place])   . Chronic atrial fibrillation ([place])   . Osteoarthritis   . Height loss 05/24/2011  . 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 Senile osteoporosis 05/24/2011   Past Medical  History:  Diagnosis Date  . Atrial fibrillation ([place])   . Cellulitis    left arm  . CHF (congestive heart failure) ([place])   . Constipation   . Dysrhythmia    hx of atrial fibrilation  . GERD (gastroesophageal reflux disease)    ocassional  . History of kidney stones   . Myasthenia gravis   . Osteoarthritis   . Pneumonia   . Prostate cancer ([place]) dx'd 1982  . Sepsis ([place])   . Shortness of breath dyspnea    with exertion  . Urine incontinence   . Varicose veins of left lower extremity     Family History  Problem Relation Age of Onset  . Heart disease Father   . Heart attack Father   . Hypertension Father   . Heart disease Brother   . Heart disease Paternal Grandfather   . Stroke Mother   . Parkinsonism Sister   . Heart disease Sister   . Dementia Sister     Past Surgical History:  Procedure Laterality Date  . AMPUTATION Left 03/20/2016   Procedure: Left Great Toe Amputation at Metatarsophalangeal Joint;  Surgeon: Newt Minion, MD;  Location: [facility];  Service: Orthopedics;  Laterality: Left;  . AMPUTATION Left 06/11/2016   Procedure: Left 2nd Toe Amputation;  Surgeon: Newt Minion, MD;  Location: [facility];  Service: Orthopedics;  Laterality: Left;  . AMPUTATION Left 07/12/2016   Procedure: LEFT 3rd TOE AMPUTATION;  Surgeon: Newt Minion, MD;  Location: [facility];  Service: Orthopedics;  Laterality: Left;  . appendectomy    . APPENDECTOMY    . COLONOSCOPY    . HERNIA REPAIR Bilateral    Inguinal  . I&D EXTREMITY Left 10/12/2014   Procedure: IRRIGATION AND DEBRIDEMENT INDEX FINGER;  Surgeon: Iran Planas, MD;  Location: [facility];  Service: Orthopedics;  Laterality: Left;  . KYPHOPLASTY  2012 ?   T 12- L1  . LAMINECTOMY  2005 ?   lumbar- 4-5  . PROSTATE SURGERY    . ROTATOR CUFF REPAIR Right   . TOTAL KNEE ARTHROPLASTY Right    Social History   Occupational History  . Retired [employer]   Social History Main Topics  . Smoking status: Never  Smoker  . Smokeless tobacco: Never Used  . Alcohol use 0.0 oz/week     Comment: occasional beer  . Drug use: No  . Sexual activity: Not on file     Comment: Widowed

## 2016-09-04 ENCOUNTER — Telehealth (INDEPENDENT_AMBULATORY_CARE_PROVIDER_SITE_OTHER): Payer: Self-pay | Admitting: Orthopedic Surgery

## 2016-09-04 NOTE — Telephone Encounter (Signed)
Patient has transmet surgery next Wednesday can you please advise about message below?

## 2016-09-04 NOTE — Telephone Encounter (Signed)
Call family and let them know that it's okay for patient to be discharged to home after surgery.

## 2016-09-04 NOTE — Telephone Encounter (Signed)
Patient's daughter [name] wants to know if patient could go home after surgery verse over night stay at the hospital due to the current flu out break.

## 2016-09-05 NOTE — Telephone Encounter (Signed)
I called and left voicemail for [name] advising patient can discharge home after surgery per MD.

## 2016-09-10 ENCOUNTER — Encounter (HOSPITAL_COMMUNITY): Payer: Self-pay | Admitting: *Deleted

## 2016-09-10 MED ORDER — FENTANYL CITRATE (PF) 100 MCG/2ML IJ SOLN: 25.0000 ug | INTRAMUSCULAR | Status: DC | PRN

## 2016-09-11 ENCOUNTER — Ambulatory Visit (HOSPITAL_COMMUNITY)
Admission: RE | Admit: 2016-09-11 | Discharge: 2016-09-11 | Disposition: A | Discharge status: Home / Self Care | Payer: Medicare Other | Source: Ambulatory Visit | Attending: Orthopedic Surgery | Admitting: Orthopedic Surgery

## 2016-09-11 ENCOUNTER — Encounter (HOSPITAL_COMMUNITY): Payer: Self-pay | Admitting: Surgery

## 2016-09-11 ENCOUNTER — Ambulatory Visit (HOSPITAL_COMMUNITY): Payer: Medicare Other | Admitting: Certified Registered"

## 2016-09-11 ENCOUNTER — Encounter (HOSPITAL_COMMUNITY)
Admission: RE | Disposition: A | Discharge status: Home / Self Care | Payer: Self-pay | Source: Ambulatory Visit | Attending: Orthopedic Surgery

## 2016-09-11 DIAGNOSIS — Y835 Amputation of limb(s) as the cause of abnormal reaction of the patient, or of later complication, without mention of misadventure at the time of the procedure: Secondary | ICD-10-CM | POA: Insufficient documentation

## 2016-09-11 DIAGNOSIS — L97529 Non-pressure chronic ulcer of other part of left foot with unspecified severity: Secondary | ICD-10-CM | POA: Diagnosis not present

## 2016-09-11 DIAGNOSIS — Z89412 Acquired absence of left great toe: Secondary | ICD-10-CM | POA: Diagnosis not present

## 2016-09-11 DIAGNOSIS — K219 Gastro-esophageal reflux disease without esophagitis: Secondary | ICD-10-CM | POA: Insufficient documentation

## 2016-09-11 DIAGNOSIS — Z79891 Long term (current) use of opiate analgesic: Secondary | ICD-10-CM | POA: Diagnosis not present

## 2016-09-11 DIAGNOSIS — E11621 Type 2 diabetes mellitus with foot ulcer: Secondary | ICD-10-CM | POA: Diagnosis not present

## 2016-09-11 DIAGNOSIS — Z89422 Acquired absence of other left toe(s): Secondary | ICD-10-CM | POA: Insufficient documentation

## 2016-09-11 DIAGNOSIS — M86272 Subacute osteomyelitis, left ankle and foot: Secondary | ICD-10-CM

## 2016-09-11 DIAGNOSIS — E1151 Type 2 diabetes mellitus with diabetic peripheral angiopathy without gangrene: Secondary | ICD-10-CM | POA: Diagnosis not present

## 2016-09-11 DIAGNOSIS — Z7951 Long term (current) use of inhaled steroids: Secondary | ICD-10-CM | POA: Diagnosis not present

## 2016-09-11 DIAGNOSIS — Z96651 Presence of right artificial knee joint: Secondary | ICD-10-CM | POA: Diagnosis not present

## 2016-09-11 DIAGNOSIS — G7000 Myasthenia gravis without (acute) exacerbation: Secondary | ICD-10-CM | POA: Insufficient documentation

## 2016-09-11 DIAGNOSIS — Z7952 Long term (current) use of systemic steroids: Secondary | ICD-10-CM | POA: Insufficient documentation

## 2016-09-11 DIAGNOSIS — Z79899 Other long term (current) drug therapy: Secondary | ICD-10-CM | POA: Diagnosis not present

## 2016-09-11 DIAGNOSIS — E1169 Type 2 diabetes mellitus with other specified complication: Secondary | ICD-10-CM | POA: Diagnosis not present

## 2016-09-11 DIAGNOSIS — I509 Heart failure, unspecified: Secondary | ICD-10-CM | POA: Insufficient documentation

## 2016-09-11 DIAGNOSIS — Z923 Personal history of irradiation: Secondary | ICD-10-CM | POA: Insufficient documentation

## 2016-09-11 DIAGNOSIS — I110 Hypertensive heart disease with heart failure: Secondary | ICD-10-CM | POA: Diagnosis not present

## 2016-09-11 DIAGNOSIS — E1140 Type 2 diabetes mellitus with diabetic neuropathy, unspecified: Secondary | ICD-10-CM | POA: Insufficient documentation

## 2016-09-11 DIAGNOSIS — T8781 Dehiscence of amputation stump: Secondary | ICD-10-CM | POA: Insufficient documentation

## 2016-09-11 DIAGNOSIS — M869 Osteomyelitis, unspecified: Secondary | ICD-10-CM | POA: Diagnosis not present

## 2016-09-11 DIAGNOSIS — Z7901 Long term (current) use of anticoagulants: Secondary | ICD-10-CM | POA: Diagnosis not present

## 2016-09-11 HISTORY — PX: AMPUTATION: SHX166

## 2016-09-11 LAB — CBC
HEMATOCRIT: 37 % — AB (ref 39.0–52.0)
HEMOGLOBIN: 12.1 g/dL — AB (ref 13.0–17.0)
MCH: 32.7 pg (ref 26.0–34.0)
MCHC: 32.7 g/dL (ref 30.0–36.0)
MCV: 100 fL (ref 78.0–100.0)
Platelets: 164 10*3/uL (ref 150–400)
RBC: 3.7 MIL/uL — ABNORMAL LOW (ref 4.22–5.81)
RDW: 14.4 % (ref 11.5–15.5)
WBC: 9 10*3/uL (ref 4.0–10.5)

## 2016-09-11 LAB — PROTIME-INR
INR: 1.78
Prothrombin Time: 20.9 seconds — ABNORMAL HIGH (ref 11.4–15.2)

## 2016-09-11 LAB — BASIC METABOLIC PANEL
Anion gap: 9 (ref 5–15)
BUN: 13 mg/dL (ref 6–20)
CHLORIDE: 102 mmol/L (ref 101–111)
CO2: 29 mmol/L (ref 22–32)
Calcium: 8.8 mg/dL — ABNORMAL LOW (ref 8.9–10.3)
Creatinine, Ser: 0.88 mg/dL (ref 0.61–1.24)
GFR calc Af Amer: >60 mL/min (ref >60)
GFR calc non Af Amer: >60 mL/min (ref >60)
GLUCOSE: 92 mg/dL (ref 65–99)
Potassium: 2.8 mmol/L — ABNORMAL LOW (ref 3.5–5.1)
Sodium: 140 mmol/L (ref 135–145)

## 2016-09-11 SURGERY — AMPUTATION, FOOT, RAY
Anesthesia: Regional | Site: Foot | Laterality: Left

## 2016-09-11 MED ORDER — CEFAZOLIN SODIUM-DEXTROSE 2-4 GM/100ML-% IV SOLN
2.0000 g | INTRAVENOUS | Status: AC
  Administered 2016-09-11: 2 g via INTRAVENOUS
  Filled 2016-09-11: qty 100

## 2016-09-11 MED ORDER — HYDROCODONE-ACETAMINOPHEN 5-325 MG PO TABS: 1.0000 | ORAL_TABLET | Freq: Four times a day (QID) | ORAL | 0 refills | Status: DC | PRN

## 2016-09-11 MED ORDER — CHLORHEXIDINE GLUCONATE 4 % EX LIQD: 60.0000 mL | Freq: Once | CUTANEOUS | Status: DC

## 2016-09-11 MED ORDER — PROPOFOL 10 MG/ML IV BOLUS
INTRAVENOUS | Status: DC | PRN
  Administered 2016-09-11: 20 mg via INTRAVENOUS

## 2016-09-11 MED ORDER — BUPIVACAINE-EPINEPHRINE (PF) 0.5% -1:200000 IJ SOLN
INTRAMUSCULAR | Status: DC | PRN
  Administered 2016-09-11: 30 mL via PERINEURAL

## 2016-09-11 MED ORDER — LACTATED RINGERS IV SOLN
INTRAVENOUS | Status: DC
  Administered 2016-09-11: 09:00:00 via INTRAVENOUS

## 2016-09-11 MED ORDER — 0.9 % SODIUM CHLORIDE (POUR BTL) OPTIME
TOPICAL | Status: DC | PRN
  Administered 2016-09-11: 1000 mL

## 2016-09-11 MED ORDER — LIDOCAINE HCL (CARDIAC) 20 MG/ML IV SOLN
INTRAVENOUS | Status: DC | PRN
  Administered 2016-09-11: 100 mg via INTRATRACHEAL

## 2016-09-11 MED ORDER — ONDANSETRON HCL 4 MG/2ML IJ SOLN: 4.0000 mg | Freq: Once | INTRAMUSCULAR | Status: DC | PRN

## 2016-09-11 MED ORDER — FENTANYL CITRATE (PF) 100 MCG/2ML IJ SOLN
INTRAMUSCULAR | Status: AC
  Filled 2016-09-11: qty 2

## 2016-09-11 MED ORDER — FLUMAZENIL 0.5 MG/5ML IV SOLN
0.2000 mg | Freq: Once | INTRAVENOUS | Status: AC
  Administered 2016-09-11: 0.2 mg via INTRAVENOUS

## 2016-09-11 MED ORDER — FLUMAZENIL 0.5 MG/5ML IV SOLN
INTRAVENOUS | Status: AC
  Filled 2016-09-11: qty 5

## 2016-09-11 MED ORDER — MIDAZOLAM HCL 2 MG/2ML IJ SOLN
INTRAMUSCULAR | Status: AC
  Filled 2016-09-11: qty 2

## 2016-09-11 MED ORDER — FENTANYL CITRATE (PF) 100 MCG/2ML IJ SOLN
INTRAMUSCULAR | Status: AC
  Filled 2016-09-11: qty 4

## 2016-09-11 MED ORDER — FENTANYL CITRATE (PF) 100 MCG/2ML IJ SOLN: 25.0000 ug | INTRAMUSCULAR | Status: DC | PRN

## 2016-09-11 MED ORDER — MIDAZOLAM HCL 2 MG/2ML IJ SOLN
INTRAMUSCULAR | Status: DC | PRN
  Administered 2016-09-11: 2 mg via INTRAVENOUS

## 2016-09-11 MED ORDER — PROPOFOL 500 MG/50ML IV EMUL
INTRAVENOUS | Status: DC | PRN
  Administered 2016-09-11: 25 ug/kg/min via INTRAVENOUS

## 2016-09-11 SURGICAL SUPPLY — 26 items
BLADE SAW SGTL MED 73X18.5 STR (BLADE) ×3 IMPLANT
BLADE SURG 21 STRL SS (BLADE) ×3 IMPLANT
BNDG COHESIVE 4X5 TAN STRL (GAUZE/BANDAGES/DRESSINGS) ×3 IMPLANT
BNDG GAUZE ELAST 4 BULKY (GAUZE/BANDAGES/DRESSINGS) ×3 IMPLANT
COVER SURGICAL LIGHT HANDLE (MISCELLANEOUS) ×6 IMPLANT
DRAPE U-SHAPE 47X51 STRL (DRAPES) ×6 IMPLANT
DRSG ADAPTIC 3X8 NADH LF (GAUZE/BANDAGES/DRESSINGS) ×3 IMPLANT
DRSG PAD ABDOMINAL 8X10 ST (GAUZE/BANDAGES/DRESSINGS) ×3 IMPLANT
DURAPREP 26ML APPLICATOR (WOUND CARE) ×3 IMPLANT
ELECT REM PT RETURN 9FT ADLT (ELECTROSURGICAL) ×3
ELECTRODE REM PT RTRN 9FT ADLT (ELECTROSURGICAL) ×1 IMPLANT
GAUZE SPONGE 4X4 12PLY STRL (GAUZE/BANDAGES/DRESSINGS) ×3 IMPLANT
GLOVE BIOGEL PI IND STRL 9 (GLOVE) ×1 IMPLANT
GLOVE BIOGEL PI INDICATOR 9 (GLOVE) ×2
GLOVE SURG ORTHO 9.0 STRL STRW (GLOVE) ×3 IMPLANT
GOWN STRL REUS W/ TWL XL LVL3 (GOWN DISPOSABLE) ×2 IMPLANT
GOWN STRL REUS W/TWL XL LVL3 (GOWN DISPOSABLE) ×4
KIT BASIN OR (CUSTOM PROCEDURE TRAY) ×3 IMPLANT
KIT ROOM TURNOVER OR (KITS) ×3 IMPLANT
NS IRRIG 1000ML POUR BTL (IV SOLUTION) ×3 IMPLANT
PACK ORTHO EXTREMITY (CUSTOM PROCEDURE TRAY) ×3 IMPLANT
PAD ARMBOARD 7.5X6 YLW CONV (MISCELLANEOUS) ×6 IMPLANT
SPONGE GAUZE 4X4 12PLY STER LF (GAUZE/BANDAGES/DRESSINGS) ×3 IMPLANT
STOCKINETTE IMPERVIOUS LG (DRAPES) ×3 IMPLANT
SUT ETHILON 2 0 PSLX (SUTURE) ×6 IMPLANT
TOWEL OR 17X26 10 PK STRL BLUE (TOWEL DISPOSABLE) ×3 IMPLANT

## 2016-09-11 NOTE — Progress Notes (Signed)
Dr Sharol Given came by-fully updated.

## 2016-09-11 NOTE — Transfer of Care (Signed)
Immediate Anesthesia Transfer of Care Note  Patient: Richard Bradley  Procedure(s) Performed: Procedure(s): Left Transmetatarsal Amputation (Left)  Patient Location: PACU  Anesthesia Type:MAC and MAC combined with regional for post-op pain  Level of Consciousness: sedated and patient cooperative  Airway & Oxygen Therapy: Patient Spontanous Breathing and Patient connected to nasal cannula oxygen  Post-op Assessment: Report given to RN and Post -op Vital signs reviewed and stable  Post vital signs: Reviewed and stable  Last Vitals:  Vitals:   09/11/16 0829 09/11/16 1115  BP: (!) 122/92 (P) 106/74  Pulse: (!) 52   Resp: 18   Temp: 36.7 C (P) 36.5 C    Last Pain:  Vitals:   09/11/16 0829  TempSrc: Oral      Patients Stated Pain Goal: 3 (05/30/10 1735)  Complications: No apparent anesthesia complications

## 2016-09-11 NOTE — Anesthesia Postprocedure Evaluation (Signed)
Anesthesia Post Note  Patient: Richard Bradley  Procedure(s) Performed: Procedure(s) (LRB): Left Transmetatarsal Amputation (Left)  Patient location during evaluation: PACU Anesthesia Type: Regional Pain management: pain level controlled Vital Signs Assessment: post-procedure vital signs reviewed and stable Respiratory status: spontaneous breathing, nonlabored ventilation and respiratory function stable Cardiovascular status: blood pressure returned to baseline and stable Postop Assessment: no signs of nausea or vomiting Anesthetic complications: no       Last Vitals:  Vitals:   09/11/16 0829 09/11/16 1115  BP: (!) 122/92 106/74  Pulse: (!) 52 (!) 111  Resp: 18 18  Temp: 36.7 C 36.5 C    Last Pain:  Vitals:   09/11/16 0829  TempSrc: Oral    LLE Motor Response: No movement due to regional block (09/11/16 1115) LLE Sensation: No sensation (absent) (09/11/16 1115)          Densel Kronick A.

## 2016-09-11 NOTE — H&P (Signed)
Richard Bradley is an 83 y.o. male.   Chief Complaint: Dehiscence left foot first second and third toe  amputation left. HPI: Patient is an 83 year old gentleman with diabetic insensate neuropathy peripheral vascular disease who is status post amputation of great toe second toe and third toe. Patient has had progressive wound dehiscence and has failed conservative wound care he has exposed bone and presents at this time for transmetatarsal amputation.  Past Medical History:  Diagnosis Date  . Atrial fibrillation ([place])   . Cellulitis    left arm  . CHF (congestive heart failure) ([place])   . Constipation   . Dysrhythmia    hx of atrial fibrilation  . GERD (gastroesophageal reflux disease)    ocassional  . History of kidney stones   . Myasthenia gravis   . Osteoarthritis   . Pneumonia    Hstory of  . Prostate cancer ([place]) dx'd 1982   treated with surgery and radiation.  Basal cell cancer  . Sepsis ([place])   . Shortness of breath dyspnea    with exertion  . Urine incontinence   . Varicose veins of left lower extremity     Past Surgical History:  Procedure Laterality Date  . AMPUTATION Left 03/20/2016   Procedure: Left Great Toe Amputation at Metatarsophalangeal Joint;  Surgeon: Newt Minion, MD;  Location: [facility];  Service: Orthopedics;  Laterality: Left;  . AMPUTATION Left 06/11/2016   Procedure: Left 2nd Toe Amputation;  Surgeon: Newt Minion, MD;  Location: [facility];  Service: Orthopedics;  Laterality: Left;  . AMPUTATION Left 07/12/2016   Procedure: LEFT 3rd TOE AMPUTATION;  Surgeon: Newt Minion, MD;  Location: [facility];  Service: Orthopedics;  Laterality: Left;  . appendectomy    . APPENDECTOMY    . COLONOSCOPY    . HERNIA REPAIR Bilateral    Inguinal  . I&D EXTREMITY Left 10/12/2014   Procedure: IRRIGATION AND DEBRIDEMENT INDEX FINGER;  Surgeon: Iran Planas, MD;  Location: [facility];  Service: Orthopedics;  Laterality: Left;  . KYPHOPLASTY  2012 ?   T 12- L1  . LAMINECTOMY  2005 ?    lumbar- 4-5  . PROSTATE SURGERY    . ROTATOR CUFF REPAIR Right   . TOTAL KNEE ARTHROPLASTY Right     Family History  Problem Relation Age of Onset  . Heart disease Father   . Heart attack Father   . Hypertension Father   . Heart disease Brother   . Heart disease Paternal Grandfather   . Stroke Mother   . Parkinsonism Sister   . Heart disease Sister   . Dementia Sister    Social History:  reports that he has never smoked. He has never used smokeless tobacco. He reports that he drinks alcohol. He reports that he does not use drugs.  Allergies:  Allergies  Allergen Reactions  . No Known Allergies     No prescriptions prior to admission.    No results found for this or any previous visit (from the past 48 hour(s)). No results found.  Review of Systems  All other systems reviewed and are negative.   There were no vitals taken for this visit. Physical Exam  On examination patient is alert oriented no adenopathy well-dressed normal affect normal respiratory effort he has an antalgic gait has palpable pulses Doppler shows biphasic flow to the ankle. He has dehiscence of the wound to the great toe second toe and third toe amputation. Assessment/Plan Assessment: Diabetic insensate neuropathy with  dehiscence of the wound for amputation of the great toe second toe and third toe.  Plan: We'll plan for a left transmetatarsal amputation. Risk and benefits were discussed including risk of the wound not healing need for higher level amputation. Patient states he understands wishes to proceed at this time.  Newt Minion, MD 09/11/2016, 7:15 AM

## 2016-09-11 NOTE — Progress Notes (Signed)
Orthopedic Tech Progress Note Patient Details:  Richard Bradley [birth year] [id number]  Ortho Devices Type of Ortho Device: Postop shoe/boot Ortho Device/Splint Location: lle Ortho Device/Splint Interventions: Application   [name] 09/11/2016, 11:45 AM Viewed order from doctor's order list

## 2016-09-11 NOTE — Anesthesia Procedure Notes (Addendum)
Anesthesia Regional Block:  Popliteal block  Pre-Anesthetic Checklist: ,, timeout performed, Correct Patient, Correct Site, Correct Laterality, Correct Procedure, Correct Position, site marked, Risks and benefits discussed,  Surgical consent,  Pre-op evaluation,  At surgeon's request and post-op pain management  Laterality: Left  Prep: chloraprep       Needles:  Injection technique: Single-shot  Needle Type: Echogenic Stimulator Needle     Needle Length: 9cm 9 cm Needle Gauge: 21 and 21 G    Additional Needles:  Procedures: ultrasound guided (picture in chart) Popliteal block Narrative:  Start time: 09/11/2016 9:35 AM End time: 09/11/2016 9:40 AM Injection made incrementally with aspirations every 5 mL.  Performed by: Personally  Anesthesiologist: Josephine Igo  Additional Notes: Timeout performed. Patient sedated. Relevant anatomy ID'd using [place]. Incremental 22ml injection with frequent aspiration. Patient tolerated procedure well.

## 2016-09-11 NOTE — Anesthesia Preprocedure Evaluation (Signed)
Anesthesia Evaluation  Patient identified by MRN, date of birth, ID band Patient awake    Reviewed: Allergy & Precautions, NPO status , Patient's Chart, lab work & pertinent test results, reviewed documented beta blocker date and time   History of Anesthesia Complications Negative for: history of anesthetic complications  Airway Mallampati: III  TM Distance: >3 FB   Mouth opening: Limited Mouth Opening Comment: Ankylosing spondylosis Dental  (+) Dental Advisory Given, Missing   Pulmonary neg pulmonary ROS, shortness of breath, pneumonia, resolved,    breath sounds clear to auscultation       Cardiovascular hypertension, Pt. on home beta blockers and Pt. on medications (-) angina+ Peripheral Vascular Disease and +CHF  + dysrhythmias Atrial Fibrillation  Rhythm:Irregular Rate:Normal  '13 ECHO: EF 50%, mild AI, mod MR   Neuro/Psych myasthenia gravis-controlled with prednisone    GI/Hepatic Neg liver ROS, GERD  Medicated and Controlled,  Endo/Other  negative endocrine ROS  Renal/GU negative Renal ROS     Musculoskeletal  (+) Arthritis , Osteoarthritis and steroids,  Gangrene left foot   Abdominal   Peds  Hematology Coumadin   Anesthesia Other Findings   Reproductive/Obstetrics                             Anesthesia Physical  Anesthesia Plan  ASA: III  Anesthesia Plan: Regional   Post-op Pain Management:    Induction:   Airway Management Planned: Natural Airway and Nasal Cannula  Additional Equipment:   Intra-op Plan:   Post-operative Plan:   Informed Consent: I have reviewed the patients History and Physical, chart, labs and discussed the procedure including the risks, benefits and alternatives for the proposed anesthesia with the patient or authorized representative who has indicated his/her understanding and acceptance.   Dental advisory given  Plan Discussed with: CRNA,  Surgeon and Anesthesiologist  Anesthesia Plan Comments: (Plan routine monitors, ankle block)        Anesthesia Quick Evaluation

## 2016-09-11 NOTE — Op Note (Signed)
     Date of Surgery: 09/11/2016  INDICATIONS: Mr. Passariello is a 83 y.o.-year-old male who is status post foot salvage intervention who has had progressive dehiscence of the toe amputation incision. He has exposed bone. Due to failure of conservative wound care and fact the patient does have palpable pulses at the ankle patient presents at this time for transmetatarsal amputation.  PREOPERATIVE DIAGNOSIS:  Osteomyelitis wound dehiscence left forefoot  POSTOPERATIVE DIAGNOSIS: Same.  PROCEDURE: Transmetatarsal amputation left foot   SURGEON: Sharol Given, M.D.  ANESTHESIA:  general  IV FLUIDS AND URINE: See anesthesia.  ESTIMATED BLOOD LOSS:  Minimal mL.  COMPLICATIONS: None.  DESCRIPTION OF PROCEDURE: The patient was brought to the operating room and underwent a general anesthetic. After adequate levels of anesthesia were obtained patient's lower extremity was prepped using DuraPrep draped into a sterile field. A timeout was called.  A fishmouth incision was made just proximal to the ulcerative nonviable tissue. This was carried sharply down to bone. A oscillating saw was used to perform a transmetatarsal amputation with a gentle cascade of the metatarsals and beveled plantarly. Electrocautery was used for hemostasis. The wound was irrigated with normal saline. The incision was closed using 2-0 nylon. A Sterile compressive dressing was applied. Patient was taken to the PACU in stable condition.  Meridee Score, MD [place] 11:06 AM

## 2016-09-11 NOTE — Anesthesia Procedure Notes (Signed)
Procedure Name: MAC Date/Time: 09/11/2016 10:52 AM Performed by: Lance Coon Pre-anesthesia Checklist: Patient identified, Emergency Drugs available, Suction available, Patient being monitored and Timeout performed Patient Re-evaluated:Patient Re-evaluated prior to inductionOxygen Delivery Method: Nasal cannula

## 2016-09-11 NOTE — Progress Notes (Signed)
Pt is easily arousable, but nods off to sleep when left alone.  Use IS up to 800cc, strong, dry cough. O2 sat 92-94 % on RA when awake, around 89-90% when asleep. Dr Royce Macadamia here to see pt. New order for IV Romazicon. Will cont to monitor.

## 2016-09-12 ENCOUNTER — Encounter (HOSPITAL_COMMUNITY): Payer: Self-pay | Admitting: Orthopedic Surgery

## 2016-09-20 ENCOUNTER — Inpatient Hospital Stay (HOSPITAL_COMMUNITY)
Admission: EM | Admit: 2016-09-20 | Discharge: 2016-10-02 | DRG: 871 | Disposition: A | Discharge status: Home Health | Payer: Medicare Other | Attending: Internal Medicine | Admitting: Internal Medicine

## 2016-09-20 ENCOUNTER — Emergency Department (HOSPITAL_COMMUNITY): Payer: Medicare Other

## 2016-09-20 ENCOUNTER — Encounter (HOSPITAL_COMMUNITY): Payer: Self-pay | Admitting: Emergency Medicine

## 2016-09-20 DIAGNOSIS — I482 Chronic atrial fibrillation, unspecified: Secondary | ICD-10-CM | POA: Diagnosis present

## 2016-09-20 DIAGNOSIS — R0603 Acute respiratory distress: Secondary | ICD-10-CM | POA: Diagnosis not present

## 2016-09-20 DIAGNOSIS — I340 Nonrheumatic mitral (valve) insufficiency: Secondary | ICD-10-CM | POA: Diagnosis present

## 2016-09-20 DIAGNOSIS — Z87442 Personal history of urinary calculi: Secondary | ICD-10-CM | POA: Diagnosis not present

## 2016-09-20 DIAGNOSIS — M79609 Pain in unspecified limb: Secondary | ICD-10-CM | POA: Diagnosis not present

## 2016-09-20 DIAGNOSIS — G8929 Other chronic pain: Secondary | ICD-10-CM | POA: Diagnosis present

## 2016-09-20 DIAGNOSIS — M549 Dorsalgia, unspecified: Secondary | ICD-10-CM | POA: Diagnosis present

## 2016-09-20 DIAGNOSIS — A4150 Gram-negative sepsis, unspecified: Secondary | ICD-10-CM | POA: Diagnosis not present

## 2016-09-20 DIAGNOSIS — Z96651 Presence of right artificial knee joint: Secondary | ICD-10-CM | POA: Diagnosis present

## 2016-09-20 DIAGNOSIS — J9601 Acute respiratory failure with hypoxia: Secondary | ICD-10-CM | POA: Diagnosis present

## 2016-09-20 DIAGNOSIS — I952 Hypotension due to drugs: Secondary | ICD-10-CM | POA: Diagnosis not present

## 2016-09-20 DIAGNOSIS — Z8546 Personal history of malignant neoplasm of prostate: Secondary | ICD-10-CM | POA: Diagnosis not present

## 2016-09-20 DIAGNOSIS — Z923 Personal history of irradiation: Secondary | ICD-10-CM | POA: Diagnosis not present

## 2016-09-20 DIAGNOSIS — J810 Acute pulmonary edema: Secondary | ICD-10-CM | POA: Diagnosis not present

## 2016-09-20 DIAGNOSIS — H538 Other visual disturbances: Secondary | ICD-10-CM | POA: Diagnosis not present

## 2016-09-20 DIAGNOSIS — Z7952 Long term (current) use of systemic steroids: Secondary | ICD-10-CM | POA: Diagnosis not present

## 2016-09-20 DIAGNOSIS — L03115 Cellulitis of right lower limb: Secondary | ICD-10-CM | POA: Diagnosis present

## 2016-09-20 DIAGNOSIS — R6889 Other general symptoms and signs: Secondary | ICD-10-CM

## 2016-09-20 DIAGNOSIS — I4891 Unspecified atrial fibrillation: Secondary | ICD-10-CM | POA: Diagnosis not present

## 2016-09-20 DIAGNOSIS — I10 Essential (primary) hypertension: Secondary | ICD-10-CM | POA: Diagnosis present

## 2016-09-20 DIAGNOSIS — Z7901 Long term (current) use of anticoagulants: Secondary | ICD-10-CM | POA: Diagnosis not present

## 2016-09-20 DIAGNOSIS — I5043 Acute on chronic combined systolic (congestive) and diastolic (congestive) heart failure: Secondary | ICD-10-CM | POA: Diagnosis present

## 2016-09-20 DIAGNOSIS — Z8249 Family history of ischemic heart disease and other diseases of the circulatory system: Secondary | ICD-10-CM

## 2016-09-20 DIAGNOSIS — R7881 Bacteremia: Secondary | ICD-10-CM | POA: Diagnosis not present

## 2016-09-20 DIAGNOSIS — I420 Dilated cardiomyopathy: Secondary | ICD-10-CM | POA: Diagnosis present

## 2016-09-20 DIAGNOSIS — Z515 Encounter for palliative care: Secondary | ICD-10-CM

## 2016-09-20 DIAGNOSIS — Z87440 Personal history of urinary (tract) infections: Secondary | ICD-10-CM | POA: Diagnosis not present

## 2016-09-20 DIAGNOSIS — I110 Hypertensive heart disease with heart failure: Secondary | ICD-10-CM | POA: Diagnosis present

## 2016-09-20 DIAGNOSIS — A4152 Sepsis due to Pseudomonas: Principal | ICD-10-CM | POA: Diagnosis present

## 2016-09-20 DIAGNOSIS — E876 Hypokalemia: Secondary | ICD-10-CM | POA: Diagnosis present

## 2016-09-20 DIAGNOSIS — Z79899 Other long term (current) drug therapy: Secondary | ICD-10-CM

## 2016-09-20 DIAGNOSIS — A419 Sepsis, unspecified organism: Secondary | ICD-10-CM | POA: Diagnosis not present

## 2016-09-20 DIAGNOSIS — J8410 Pulmonary fibrosis, unspecified: Secondary | ICD-10-CM | POA: Diagnosis present

## 2016-09-20 DIAGNOSIS — Z9981 Dependence on supplemental oxygen: Secondary | ICD-10-CM | POA: Diagnosis not present

## 2016-09-20 DIAGNOSIS — Z89432 Acquired absence of left foot: Secondary | ICD-10-CM

## 2016-09-20 DIAGNOSIS — G7000 Myasthenia gravis without (acute) exacerbation: Secondary | ICD-10-CM | POA: Diagnosis present

## 2016-09-20 DIAGNOSIS — I517 Cardiomegaly: Secondary | ICD-10-CM | POA: Diagnosis not present

## 2016-09-20 DIAGNOSIS — Z5181 Encounter for therapeutic drug level monitoring: Secondary | ICD-10-CM | POA: Diagnosis not present

## 2016-09-20 DIAGNOSIS — E1151 Type 2 diabetes mellitus with diabetic peripheral angiopathy without gangrene: Secondary | ICD-10-CM | POA: Diagnosis present

## 2016-09-20 DIAGNOSIS — Z89422 Acquired absence of other left toe(s): Secondary | ICD-10-CM | POA: Diagnosis not present

## 2016-09-20 DIAGNOSIS — B377 Candidal sepsis: Secondary | ICD-10-CM | POA: Diagnosis present

## 2016-09-20 DIAGNOSIS — J90 Pleural effusion, not elsewhere classified: Secondary | ICD-10-CM

## 2016-09-20 DIAGNOSIS — Z85828 Personal history of other malignant neoplasm of skin: Secondary | ICD-10-CM

## 2016-09-20 DIAGNOSIS — Z91048 Other nonmedicinal substance allergy status: Secondary | ICD-10-CM

## 2016-09-20 DIAGNOSIS — L539 Erythematous condition, unspecified: Secondary | ICD-10-CM | POA: Diagnosis not present

## 2016-09-20 DIAGNOSIS — R0602 Shortness of breath: Secondary | ICD-10-CM | POA: Diagnosis not present

## 2016-09-20 DIAGNOSIS — Z7189 Other specified counseling: Secondary | ICD-10-CM | POA: Diagnosis not present

## 2016-09-20 DIAGNOSIS — R0600 Dyspnea, unspecified: Secondary | ICD-10-CM

## 2016-09-20 DIAGNOSIS — B49 Unspecified mycosis: Secondary | ICD-10-CM | POA: Diagnosis not present

## 2016-09-20 DIAGNOSIS — S98132A Complete traumatic amputation of one left lesser toe, initial encounter: Secondary | ICD-10-CM

## 2016-09-20 DIAGNOSIS — D72829 Elevated white blood cell count, unspecified: Secondary | ICD-10-CM | POA: Diagnosis not present

## 2016-09-20 DIAGNOSIS — B379 Candidiasis, unspecified: Secondary | ICD-10-CM | POA: Diagnosis not present

## 2016-09-20 DIAGNOSIS — E872 Acidosis: Secondary | ICD-10-CM | POA: Diagnosis present

## 2016-09-20 HISTORY — DX: Testicular hypofunction: E29.1

## 2016-09-20 LAB — CBC
HEMATOCRIT: 36.8 % — AB (ref 39.0–52.0)
HEMOGLOBIN: 12.1 g/dL — AB (ref 13.0–17.0)
MCH: 32.5 pg (ref 26.0–34.0)
MCHC: 32.9 g/dL (ref 30.0–36.0)
MCV: 98.9 fL (ref 78.0–100.0)
Platelets: 223 10*3/uL (ref 150–400)
RBC: 3.72 MIL/uL — AB (ref 4.22–5.81)
RDW: 14 % (ref 11.5–15.5)
WBC: 19.5 10*3/uL — AB (ref 4.0–10.5)

## 2016-09-20 LAB — COMPREHENSIVE METABOLIC PANEL
ALBUMIN: 3.3 g/dL — AB (ref 3.5–5.0)
ALT: 14 U/L — ABNORMAL LOW (ref 17–63)
ANION GAP: 12 (ref 5–15)
AST: 20 U/L (ref 15–41)
Alkaline Phosphatase: 56 U/L (ref 38–126)
BILIRUBIN TOTAL: 0.9 mg/dL (ref 0.3–1.2)
BUN: 15 mg/dL (ref 6–20)
CO2: 28 mmol/L (ref 22–32)
Calcium: 8.9 mg/dL (ref 8.9–10.3)
Chloride: 100 mmol/L — ABNORMAL LOW (ref 101–111)
Creatinine, Ser: 0.78 mg/dL (ref 0.61–1.24)
GFR calc Af Amer: >60 mL/min (ref >60)
GFR calc non Af Amer: >60 mL/min (ref >60)
GLUCOSE: 119 mg/dL — AB (ref 65–99)
Potassium: 2.8 mmol/L — ABNORMAL LOW (ref 3.5–5.1)
Sodium: 140 mmol/L (ref 135–145)
TOTAL PROTEIN: 5.8 g/dL — AB (ref 6.5–8.1)

## 2016-09-20 LAB — URINALYSIS, ROUTINE W REFLEX MICROSCOPIC
BILIRUBIN URINE: NEGATIVE
GLUCOSE, UA: NEGATIVE mg/dL
KETONES UR: NEGATIVE mg/dL
LEUKOCYTES UA: NEGATIVE
NITRITE: NEGATIVE
PROTEIN: NEGATIVE mg/dL
Specific Gravity, Urine: 1.015 (ref 1.005–1.030)
Squamous Epithelial / LPF: NONE SEEN
pH: 6 (ref 5.0–8.0)

## 2016-09-20 LAB — INFLUENZA PANEL BY PCR (TYPE A & B)
Influenza A By PCR: NEGATIVE
Influenza B By PCR: NEGATIVE

## 2016-09-20 LAB — MAGNESIUM: Magnesium: 1.7 mg/dL (ref 1.7–2.4)

## 2016-09-20 LAB — I-STAT CG4 LACTIC ACID, ED: Lactic Acid, Venous: 1.98 mmol/L (ref 0.5–1.9)

## 2016-09-20 LAB — LIPASE, BLOOD: Lipase: 18 U/L (ref 11–51)

## 2016-09-20 MED ORDER — DEXTROSE 5 % IV SOLN: 1.0000 g | Freq: Once | INTRAVENOUS | Status: DC

## 2016-09-20 MED ORDER — VANCOMYCIN HCL IN DEXTROSE 1-5 GM/200ML-% IV SOLN
1000.0000 mg | Freq: Two times a day (BID) | INTRAVENOUS | Status: DC
  Administered 2016-09-20 – 2016-09-22 (×4): 1000 mg via INTRAVENOUS
  Filled 2016-09-20 (×4): qty 200

## 2016-09-20 MED ORDER — MAGNESIUM SULFATE 2 GM/50ML IV SOLN
2.0000 g | Freq: Once | INTRAVENOUS | Status: AC
  Administered 2016-09-20: 2 g via INTRAVENOUS
  Filled 2016-09-20: qty 50

## 2016-09-20 MED ORDER — SODIUM CHLORIDE 0.9 % IV BOLUS (SEPSIS)
1000.0000 mL | Freq: Once | INTRAVENOUS | Status: AC
  Administered 2016-09-20: 1000 mL via INTRAVENOUS

## 2016-09-20 MED ORDER — POTASSIUM CHLORIDE IN NACL 20-0.9 MEQ/L-% IV SOLN
INTRAVENOUS | Status: DC
  Filled 2016-09-20: qty 1000

## 2016-09-20 MED ORDER — ACETAMINOPHEN 650 MG RE SUPP
650.0000 mg | Freq: Once | RECTAL | Status: AC
  Administered 2016-09-20: 650 mg via RECTAL
  Filled 2016-09-20: qty 1

## 2016-09-20 MED ORDER — SODIUM CHLORIDE 0.9 % IV SOLN
30.0000 meq | Freq: Once | INTRAVENOUS | Status: AC
  Administered 2016-09-20: 30 meq via INTRAVENOUS
  Filled 2016-09-20: qty 15

## 2016-09-20 MED ORDER — PIPERACILLIN-TAZOBACTAM 3.375 G IVPB 30 MIN
3.3750 g | Freq: Once | INTRAVENOUS | Status: AC
  Administered 2016-09-20: 3.375 g via INTRAVENOUS
  Filled 2016-09-20: qty 50

## 2016-09-20 MED ORDER — ONDANSETRON HCL 4 MG/2ML IJ SOLN
4.0000 mg | Freq: Once | INTRAMUSCULAR | Status: AC
  Administered 2016-09-20: 4 mg via INTRAVENOUS
  Filled 2016-09-20: qty 2

## 2016-09-20 NOTE — ED Notes (Signed)
ED Provider at bedside. 

## 2016-09-20 NOTE — ED Triage Notes (Signed)
Pt presents to ED for assessment after having nausea, chill and dry-heaves.  Pt told his family "I just don't feel well and brought to ED.  Pt denies any pain at this time or SOB.  HR dropping occasionally to 25-45 during triage when on pulse ox.

## 2016-09-20 NOTE — Progress Notes (Signed)
Pharmacy Antibiotic Note  Richard Bradley is a 83 y.o. male admitted on 09/20/2016 with sepsis.  Pharmacy has been consulted for Vancomcyin dosing.  Zosyn 3.375gm given in ED ~2200  Plan: Vancomycin 1gm IV q12h Will f/u micro data, renal function, and pt's clinical condition Vanc trough prn Will f/u gram negative coverage upon admission     Temp (24hrs), Avg:101 F (38.3 C), Min:100.5 F (38.1 C), Max:101.5 F (38.6 C)   Recent Labs Lab 09/20/16 1948 09/20/16 2202  WBC 19.5*  --   CREATININE 0.78  --   LATICACIDVEN  --  1.98*    Estimated Creatinine Clearance: 73.7 mL/min (by C-G formula based on SCr of 0.78 mg/dL).    Allergies  Allergen Reactions  . Tape Other (See Comments)    PATIENT'S SKIN IS VERY THIN AND WILL TEAR AND BRUISE VERY EASILY!!    Antimicrobials this admission: 2/23 Zosyn x1 2/23 Vanc  >>   Dose adjustments this admission: n/a  Microbiology results: 2/23 BCx x2:   Thank you for allowing pharmacy to be a part of this patient's care.  Sherlon Handing, PharmD, BCPS Clinical pharmacist, pager [phone] 09/20/2016 10:43 PM

## 2016-09-20 NOTE — ED Notes (Signed)
Attempted to call report

## 2016-09-20 NOTE — ED Provider Notes (Signed)
[facility] DEPT Provider Note   CSN: [id number] Arrival date & time: 09/20/16  1931     History   Chief Complaint Chief Complaint  Patient presents with  . Nausea  . Fever  . flu like symptoms    HPI Richard Bradley is a 83 y.o. male.  Patient presents with family members for fevers chills nausea and cough that started earlier today. No significant sick contacts. Patient has a history of atrial fibrillation. Patient has congestive heart failure, reflux, myasthenia gravis and history of UTI/sepsis. Patient has general weakness after the symptoms. Patient's had low potassium and is on Lasix. On February 15 patient had partially amputation of left foot which went well. Patient's daughter is physical therapist and she has been changing regularly and no signs infection.      Past Medical History:  Diagnosis Date  . Atrial fibrillation ([place])   . Cellulitis    left arm  . CHF (congestive heart failure) ([place])   . Constipation   . Dysrhythmia    hx of atrial fibrilation  . GERD (gastroesophageal reflux disease)    ocassional  . History of kidney stones   . Myasthenia gravis   . Osteoarthritis   . Pneumonia    Hstory of  . Prostate cancer ([place]) dx'd 1982   treated with surgery and radiation.  Basal cell cancer  . Sepsis ([place])   . Shortness of breath dyspnea    with exertion  . Urine incontinence   . Varicose veins of left lower extremity     Patient Active Problem List   Diagnosis Date Noted  . Wound dehiscence, surgical 09/03/2016  . Contusion of left leg 07/10/2016  . Idiopathic chronic venous hypertension of both lower extremities with inflammation 07/02/2016  . Acquired absence of other left toe(s) ([place]) 07/02/2016  . Acute cystitis without hematuria 07/01/2016  . Amputated toe, left ([place]) 06/18/2016  . Chest pain 06/12/2016  . Subacute osteomyelitis, left ankle and foot ([place])   . Pyogenic inflammation of bone ([place]) 06/05/2016  . Onychomycosis 06/05/2016  .  CAP (community acquired pneumonia) 05/25/2016  . Hypokalemia 05/25/2016  . Sepsis ([place]) 05/24/2016  . Chronic diastolic congestive heart failure ([place])   . Hypocalcemia 10/22/2015  . Influenza B 10/21/2015  . Sepsis due to Escherichia coli (E. coli) ([place]) 09/24/2015  . Chronic systolic heart failure ([place]) 04/30/2015  . Sepsis due to Escherichia coli ([place]) 04/30/2015  . Urinary obstruction 04/25/2015  . Insomnia 08/12/2014  . Debilitated patient 05/31/2014  . Closed fracture of rib of left side 05/31/2014  . Blurry vision 02/20/2014  . Chronic steroid use 02/20/2014  . Leukocytosis 02/20/2014  . Supratherapeutic INR 02/20/2014  . Anticoagulated on Coumadin 12/20/2013  . Encounter for therapeutic drug monitoring 10/26/2013  . Low back pain 07/15/2013  . Disorder of sacrum 05/18/2013  . Hand joint pain 05/18/2013  . Abnormality of gait 04/13/2013  . 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 Seasonal allergic rhinitis 05/26/2012  . Shortness of breath 03/24/2012  . Acute upper respiratory infection 08/02/2011  . Dilated cardiomyopathy ([place]) 08/02/2011  . GERD (gastroesophageal reflux disease) 08/02/2011  . Myasthenia exacerbation ([place]) 06/27/2011  . Prostate cancer ([place])   . Chronic atrial fibrillation ([place])   . Osteoarthritis   . Height loss 05/24/2011  . 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 Senile osteoporosis 05/24/2011    Past Surgical History:  Procedure Laterality Date  . AMPUTATION Left 03/20/2016   Procedure: Left Great Toe Amputation at Metatarsophalangeal  Joint;  Surgeon: Newt Minion, MD;  Location: [facility];  Service: Orthopedics;  Laterality: Left;  . AMPUTATION Left 06/11/2016   Procedure: Left 2nd Toe Amputation;  Surgeon: Newt Minion, MD;  Location: [facility];  Service: Orthopedics;  Laterality: Left;  . AMPUTATION Left 07/12/2016   Procedure: LEFT 3rd TOE AMPUTATION;  Surgeon: Newt Minion, MD;  Location: [facility];  Service: Orthopedics;   Laterality: Left;  . AMPUTATION Left 09/11/2016   Procedure: Left Transmetatarsal Amputation;  Surgeon: Newt Minion, MD;  Location: [facility];  Service: Orthopedics;  Laterality: Left;  . appendectomy    . APPENDECTOMY    . COLONOSCOPY    . HERNIA REPAIR Bilateral    Inguinal  . I&D EXTREMITY Left 10/12/2014   Procedure: IRRIGATION AND DEBRIDEMENT INDEX FINGER;  Surgeon: Iran Planas, MD;  Location: [facility];  Service: Orthopedics;  Laterality: Left;  . KYPHOPLASTY  2012 ?   T 12- L1  . LAMINECTOMY  2005 ?   lumbar- 4-5  . PROSTATE SURGERY    . ROTATOR CUFF REPAIR Right   . TOTAL KNEE ARTHROPLASTY Right        Home Medications    Prior to Admission medications   Medication Sig Start Date End Date Taking? Authorizing Provider  HYDROcodone-acetaminophen (NORCO) 5-325 MG tablet Take 1 tablet by mouth every 6 (six) hours as needed. 09/11/16  Yes Newt Minion, MD  ipratropium (ATROVENT) 0.03 % nasal spray Place 2 sprays into both nostrils 3 (three) times daily as needed (for congestion).   Yes Historical Provider, MD  Cholecalciferol (VITAMIN D-3 PO) Take 2,000 Units by mouth daily.     Historical Provider, MD  escitalopram (LEXAPRO) 5 MG tablet Take 5 mg by mouth at bedtime. 09/15/16   Historical Provider, MD  FIBER PO Take 1 tablet by mouth 3 (three) times daily.     Historical Provider, MD  fluticasone (FLONASE) 50 MCG/ACT nasal spray Place 2 sprays into both nostrils 2 (two) times daily.    Historical Provider, MD  furosemide (LASIX) 20 MG tablet Take 40 mg once daily for 5 days. Then resume 20 mg twice daily. Patient taking differently: Take 20 mg by mouth 2 (two) times daily.  07/01/16   Sedalia Muta, FNP  HYDROcodone-acetaminophen (NORCO) 10-325 MG per tablet Take 1 tablet by mouth every 5 (five) hours as needed for moderate pain.     Historical Provider, MD  metoprolol tartrate (LOPRESSOR) 25 MG tablet Take 12.5 mg by mouth 2 (two) times daily.     Historical Provider, MD    predniSONE (DELTASONE) 10 MG tablet Take 2 tablets (20 mg total) by mouth daily. Patient taking differently: Take 20 mg by mouth every morning.  04/19/16   Kathrynn Ducking, MD  vitamin B-12 (CYANOCOBALAMIN) 1000 MCG tablet Take 1,000 mcg by mouth daily.    Historical Provider, MD  warfarin (COUMADIN) 5 MG tablet Take as directed by coumadin clinic Patient taking differently: Take 2.5-5 mg by mouth See admin instructions. 2.5 mg at lunch(time) on Sun/Tues/Thurs/Sat and 5 mg on Mon/Wed/Fri 07/11/16   Belva Crome, MD    Family History Family History  Problem Relation Age of Onset  . Heart disease Father   . Heart attack Father   . Hypertension Father   . Heart disease Brother   . Heart disease Paternal Grandfather   . Stroke Mother   . Parkinsonism Sister   . Heart disease Sister   . Dementia Sister  Social History Social History  Substance Use Topics  . Smoking status: Never Smoker  . Smokeless tobacco: Never Used  . Alcohol use 0.0 oz/week     Comment: occasional beer     Allergies   Tape   Review of Systems Review of Systems  Unable to perform ROS: Mental status change     Physical Exam Updated Vital Signs BP (!) 131/109 (BP Location: Right Arm)   Pulse 85   Temp 101.5 F (38.6 C) (Rectal)   Resp 18   SpO2 91%   Physical Exam  Constitutional: He appears well-developed. No distress.  HENT:  Head: Normocephalic.  Dry mucous membranes  Eyes: Pupils are equal, round, and reactive to light. Right eye exhibits no discharge. Left eye exhibits no discharge.  Neck: Neck supple.  Cardiovascular: Tachycardia present.   Pulmonary/Chest: Effort normal. He has rales (mild cracklesleft base).  Abdominal: Soft. There is no tenderness.  Musculoskeletal: He exhibits no edema.  Lymphadenopathy:    Cervical adenopathy: no meningismus.  Neurological: He is alert.  Fatigue appearance patient will briefly answer questions to loud verbal.  Skin: Skin is warm. Capillary  refill takes less than 2 seconds.  Patient has healing surgical wound left midfoot no signs of external infection no drainage.  Psychiatric:  Sleepy, arousable  Nursing note and vitals reviewed.    ED Treatments / Results  Labs (all labs ordered are listed, but only abnormal results are displayed) Labs Reviewed  COMPREHENSIVE METABOLIC PANEL - Abnormal; Notable for the following:       Result Value   Potassium 2.8 (*)    Chloride 100 (*)    Glucose, Bld 119 (*)    Total Protein 5.8 (*)    Albumin 3.3 (*)    ALT 14 (*)    All other components within normal limits  CBC - Abnormal; Notable for the following:    WBC 19.5 (*)    RBC 3.72 (*)    Hemoglobin 12.1 (*)    HCT 36.8 (*)    All other components within normal limits  URINALYSIS, ROUTINE W REFLEX MICROSCOPIC - Abnormal; Notable for the following:    APPearance HAZY (*)    Hgb urine dipstick MODERATE (*)    Bacteria, UA RARE (*)    All other components within normal limits  CULTURE, BLOOD (ROUTINE X 2)  CULTURE, BLOOD (ROUTINE X 2)  LIPASE, BLOOD  INFLUENZA PANEL BY PCR (TYPE A & B)  I-STAT CG4 LACTIC ACID, ED    EKG  EKG Interpretation  Date/Time:  Friday September 20 2016 19:54:21 EST Ventricular Rate:  116 PR Interval:    QRS Duration: 108 QT Interval:  350 QTC Calculation: 486 R Axis:   -3 Text Interpretation:  Atrial fibrillation with rapid ventricular response Incomplete right bundle branch block Nonspecific ST abnormality Abnormal ECG Confirmed by Kristianna Saperstein MD, Aleysia Oltmann [phone]) on 09/20/2016 9:29:21 PM       Radiology Dg Chest 2 View  Result Date: 09/20/2016 CLINICAL DATA:  Nausea chills and dry heaves EXAM: CHEST  2 VIEW COMPARISON:  06/14/2016 FINDINGS: Mildly low lung volumes. Extensive calcified pleural plaques. Small bilateral effusions. No focal consolidation. Stable moderate cardiomegaly. No pneumothorax. Atherosclerosis. Previous vertebral augmentation of the thoracolumbar spine. Moderate compression  of a mid thoracic vertebra. IMPRESSION: 1. Small bilateral effusions.  No focal consolidation. 2. Stable moderate cardiomegaly without overt edema 3. Extensive calcified pleural plaques Electronically Signed   By: Donavan Foil M.D.   On: 09/20/2016 21:19  Procedures Procedures (including critical care time) CRITICAL CARE Performed by: Mariea Clonts   Total critical care time: 35 minutes  Critical care time was exclusive of separately billable procedures and treating other patients.  Critical care was necessary to treat or prevent imminent or life-threatening deterioration.  Critical care was time spent personally by me on the following activities: development of treatment plan with patient and/or surrogate as well as nursing, discussions with consultants, evaluation of patient's response to treatment, examination of patient, obtaining history from patient or surrogate, ordering and performing treatments and interventions, ordering and review of laboratory studies, ordering and review of radiographic studies, pulse oximetry and re-evaluation of patient's condition.  Medications Ordered in ED Medications  sodium chloride 0.9 % bolus 1,000 mL (not administered)  ondansetron (ZOFRAN) injection 4 mg (not administered)  potassium chloride 30 mEq in sodium chloride 0.9 % 265 mL (KCL MULTIRUN) IVPB (not administered)  piperacillin-tazobactam (ZOSYN) IVPB 3.375 g (not administered)  acetaminophen (TYLENOL) suppository 650 mg (not administered)     Initial Impression / Assessment and Plan / ED Course  I have reviewed the triage vital signs and the nursing notes.  Pertinent labs & imaging results that were available during my care of the patient were reviewed by me and considered in my medical decision making (see chart for details).    Patient presents with general fatigue flulike symptoms with fever. No sign infection from his recent procedure. Chest x-ray no significant signs of  pneumonia, urinalysis overall unremarkable. Plan for cultures, antibiotic, sepsis workup and admission the hospital. IV potassium ordered patient nauseous in the room.  The patients results and plan were reviewed and discussed.   Any x-rays performed were independently reviewed by myself.   Differential diagnosis were considered with the presenting HPI.  Medications  sodium chloride 0.9 % bolus 1,000 mL (not administered)  ondansetron (ZOFRAN) injection 4 mg (not administered)  potassium chloride 30 mEq in sodium chloride 0.9 % 265 mL (KCL MULTIRUN) IVPB (not administered)  piperacillin-tazobactam (ZOSYN) IVPB 3.375 g (not administered)  acetaminophen (TYLENOL) suppository 650 mg (not administered)    Vitals:   09/20/16 1947 09/20/16 2148  BP: (!) 131/109   Pulse: 85   Resp: 18   Temp: 100.5 F (38.1 C) 101.5 F (38.6 C)  TempSrc: Oral Rectal  SpO2: 91%     Final diagnoses:  Hypokalemia  Flu-like symptoms    Admission/ observation were discussed with the admitting physician, patient and/or family and they are comfortable with the plan.    Final Clinical Impressions(s) / ED Diagnoses   Final diagnoses:  Hypokalemia  Flu-like symptoms    New Prescriptions New Prescriptions   No medications on file     Elnora Morrison, MD 09/20/16 2157

## 2016-09-20 NOTE — ED Notes (Signed)
Previous shift attempted report.  Nurse to call back when available.

## 2016-09-20 NOTE — H&P (Signed)
History and Physical    ANDY BIBEAULT [id number] DOB: [birth year] DOA: 09/20/2016  PCP: [name], MD   Patient coming from: Home.  Chief Complaint: Nausea, chills and dry heaves.  HPI: Richard Bradley is a 83 y.o. male with medical history significant of chronic atrial fibrillation, CHF, constipation, GERD, urolithiasis, myasthenia gravis, osteoarthritis, prostate cancer, basal cell CA, history of sepsis who recently underwent a left TMT amputation and is coming to the emergency department with nausea, dry heaves and chills since this morning. Patient was found to be febrile in the emergency department with a temperature of 101.43F. He denies headache, sore throat, rhinorrhea, productive cough, chest pain, dyspnea, palpitations, dizziness, diaphoresis, abdominal pain, emesis, diarrhea, melena or hematochezia. He gets occasional constipation. He denies dysuria, hematuria or frequency.  ED Course: The patient received 1 L normal saline bolus. Zosyn and vancomycin in the emergency department. Urinalysis and chest radiograph did not reveal sources of infection. WBC 19.5 (the patient takes prednisone 10 mg by mouth daily for MG), hemoglobin 12.1 and platelets 223. Influenza by PCR was negative.   Lipase was 18, albumin 3.3, the rest of the LFTs within normal limits. Sodium 140, potassium 2.8 and chloride 100 and bicarbonate 20 and lactic acid 1.98 mmol/L. BUN is 15, creatinine 0.78, magnesium 1.7 and glucose 119 mg/dL.  Review of Systems: As per HPI otherwise 10 point review of systems negative.    Past Medical History:  Diagnosis Date  . Atrial fibrillation ([place])   . Cellulitis    left arm  . CHF (congestive heart failure) ([place])   . Constipation   . Dysrhythmia    hx of atrial fibrilation  . GERD (gastroesophageal reflux disease)    ocassional  . History of kidney stones   . Myasthenia gravis   . Osteoarthritis   . Pneumonia    Hstory of  . Prostate cancer ([place]) dx'd 1982     treated with surgery and radiation.  Basal cell cancer  . Sepsis ([place])   . Shortness of breath dyspnea    with exertion  . Urine incontinence   . Varicose veins of left lower extremity     Past Surgical History:  Procedure Laterality Date  . AMPUTATION Left 03/20/2016   Procedure: Left Great Toe Amputation at Metatarsophalangeal Joint;  Surgeon: Newt Minion, MD;  Location: [place];  Service: Orthopedics;  Laterality: Left;  . AMPUTATION Left 06/11/2016   Procedure: Left 2nd Toe Amputation;  Surgeon: Newt Minion, MD;  Location: [place];  Service: Orthopedics;  Laterality: Left;  . AMPUTATION Left 07/12/2016   Procedure: LEFT 3rd TOE AMPUTATION;  Surgeon: Newt Minion, MD;  Location: [place];  Service: Orthopedics;  Laterality: Left;  . AMPUTATION Left 09/11/2016   Procedure: Left Transmetatarsal Amputation;  Surgeon: Newt Minion, MD;  Location: [place];  Service: Orthopedics;  Laterality: Left;  . appendectomy    . APPENDECTOMY    . COLONOSCOPY    . HERNIA REPAIR Bilateral    Inguinal  . I&D EXTREMITY Left 10/12/2014   Procedure: IRRIGATION AND DEBRIDEMENT INDEX FINGER;  Surgeon: Iran Planas, MD;  Location: [place];  Service: Orthopedics;  Laterality: Left;  . KYPHOPLASTY  2012 ?   T 12- L1  . LAMINECTOMY  2005 ?   lumbar- 4-5  . PROSTATE SURGERY    . ROTATOR CUFF REPAIR Right   . TOTAL KNEE ARTHROPLASTY Right      reports that he has never smoked. He has  never used smokeless tobacco. He reports that he drinks alcohol. He reports that he does not use drugs.  Allergies  Allergen Reactions  . Tape Other (See Comments)    PATIENT'S SKIN IS VERY THIN AND WILL TEAR AND BRUISE VERY EASILY!!    Family History  Problem Relation Age of Onset  . Heart disease Father   . Heart attack Father   . Hypertension Father   . Heart disease Brother   . Heart disease Paternal Grandfather   . Stroke Mother   . Parkinsonism Sister   . Heart disease Sister   . Dementia Sister     Prior to  Admission medications   Medication Sig Start Date End Date Taking? Authorizing Provider  Cholecalciferol (VITAMIN D-3 PO) Take 2,000 Units by mouth daily.    Yes Historical Provider, MD  escitalopram (LEXAPRO) 5 MG tablet Take 5 mg by mouth at bedtime. 09/15/16  Yes Historical Provider, MD  FIBER PO Take 1 tablet by mouth 3 (three) times daily.    Yes Historical Provider, MD  fluticasone (FLONASE) 50 MCG/ACT nasal spray Place 1 spray into both nostrils 2 (two) times daily.    Yes Historical Provider, MD  furosemide (LASIX) 20 MG tablet Take 40 mg once daily for 5 days. Then resume 20 mg twice daily. Patient taking differently: Take 20 mg by mouth 2 (two) times daily.  07/01/16  Yes Sedalia Muta, FNP  guaiFENesin (MUCINEX) 600 MG 12 hr tablet Take 600 mg by mouth 2 (two) times daily as needed for to loosen phlegm.   Yes Historical Provider, MD  HYDROcodone-acetaminophen (NORCO) 10-325 MG per tablet Take 1 tablet by mouth every 6 (six) hours as needed (for pain).    Yes Historical Provider, MD  ipratropium (ATROVENT) 0.03 % nasal spray Place 2 sprays into both nostrils 3 (three) times daily as needed (for congestion).   Yes Historical Provider, MD  metoprolol tartrate (LOPRESSOR) 25 MG tablet Take 12.5 mg by mouth 2 (two) times daily.    Yes Historical Provider, MD  predniSONE (DELTASONE) 10 MG tablet Take 2 tablets (20 mg total) by mouth daily. Patient taking differently: Take 20 mg by mouth every morning.  04/19/16  Yes Kathrynn Ducking, MD  vitamin B-12 (CYANOCOBALAMIN) 1000 MCG tablet Take 1,000 mcg by mouth daily.   Yes Historical Provider, MD  warfarin (COUMADIN) 5 MG tablet Take as directed by coumadin clinic Patient taking differently: Take 2.5-5 mg by mouth See admin instructions. 2.5 mg at lunch(time) on Sun/Tues/Thurs/Sat and 5 mg on Mon/Wed/Fri 07/11/16  Yes Belva Crome, MD  HYDROcodone-acetaminophen [facility]) 5-325 MG tablet Take 1 tablet by mouth every 6 (six) hours as  needed. Patient not taking: Reported on 09/20/2016 09/11/16   Newt Minion, MD    Physical Exam:  Constitutional: NAD, calm, comfortable Vitals:   09/20/16 2230 09/20/16 2245 09/20/16 2300 09/20/16 2315  BP: 134/99 142/84 126/83 107/75  Pulse: 114 108  115  Resp: (!) 28 (!) 28 25 (!) 27  Temp:      TempSrc:      SpO2: 90% 92% 91% 91%   Eyes: PERRL, lids and conjunctivae normal ENMT: Mucous membranes are mildly dry. Posterior pharynx clear of any exudate or lesions. Neck: normal, supple, no masses, no thyromegaly Respiratory: clear to auscultation bilaterally, no wheezing, no crackles. Normal respiratory effort. No accessory muscle use.  Cardiovascular: Irregularly irregular, no murmurs / rubs / gallops. No extremity edema. 2+ pedal pulses. No carotid bruits.  Abdomen: Soft, no tenderness, no masses palpated. No hepatosplenomegaly. Bowel sounds positive.  Musculoskeletal: no clubbing / cyanosis. Left foot dressing in place. Good ROM, no contractures. Normal muscle tone.  Skin: scattered ecchymoses of extremities. Neurologic: Somnolent. Arousable.Moves all extremities. Generalized weakness. Psychiatric: Somnolent. Information given by his daughters.    Labs on Admission: I have personally reviewed following labs and imaging studies  CBC:  Recent Labs Lab 09/20/16 1948  WBC 19.5*  HGB 12.1*  HCT 36.8*  MCV 98.9  PLT [id number]   Basic Metabolic Panel:  Recent Labs Lab 09/20/16 1948 09/20/16 2158  NA 140  --   K 2.8*  --   CL 100*  --   CO2 28  --   GLUCOSE 119*  --   BUN 15  --   CREATININE 0.78  --   CALCIUM 8.9  --   MG  --  1.7   GFR: Estimated Creatinine Clearance: 73.7 mL/min (by C-G formula based on SCr of 0.78 mg/dL). Liver Function Tests:  Recent Labs Lab 09/20/16 1948  AST 20  ALT 14*  ALKPHOS 56  BILITOT 0.9  PROT 5.8*  ALBUMIN 3.3*    Recent Labs Lab 09/20/16 1948  LIPASE 18   No results for input(s): AMMONIA in the last 168  hours. Coagulation Profile: No results for input(s): INR, PROTIME in the last 168 hours. Cardiac Enzymes: No results for input(s): CKTOTAL, CKMB, CKMBINDEX, TROPONINI in the last 168 hours. BNP (last 3 results) No results for input(s): PROBNP in the last 8760 hours. HbA1C: No results for input(s): HGBA1C in the last 72 hours. CBG: No results for input(s): GLUCAP in the last 168 hours. Lipid Profile: No results for input(s): CHOL, HDL, LDLCALC, TRIG, CHOLHDL, LDLDIRECT in the last 72 hours. Thyroid Function Tests: No results for input(s): TSH, T4TOTAL, FREET4, T3FREE, THYROIDAB in the last 72 hours. Anemia Panel: No results for input(s): VITAMINB12, FOLATE, FERRITIN, TIBC, IRON, RETICCTPCT in the last 72 hours. Urine analysis:    Component Value Date/Time   COLORURINE YELLOW 09/20/2016 2024   APPEARANCEUR HAZY (A) 09/20/2016 2024   LABSPEC 1.015 09/20/2016 2024   PHURINE 6.0 09/20/2016 2024   GLUCOSEU NEGATIVE 09/20/2016 2024   HGBUR MODERATE (A) 09/20/2016 2024   BILIRUBINUR NEGATIVE 09/20/2016 2024   KETONESUR NEGATIVE 09/20/2016 2024   PROTEINUR NEGATIVE 09/20/2016 2024   NITRITE NEGATIVE 09/20/2016 2024   LEUKOCYTESUR NEGATIVE 09/20/2016 2024    Radiological Exams on Admission: Dg Chest 2 View  Result Date: 09/20/2016 CLINICAL DATA:  Nausea chills and dry heaves EXAM: CHEST  2 VIEW COMPARISON:  06/14/2016 FINDINGS: Mildly low lung volumes. Extensive calcified pleural plaques. Small bilateral effusions. No focal consolidation. Stable moderate cardiomegaly. No pneumothorax. Atherosclerosis. Previous vertebral augmentation of the thoracolumbar spine. Moderate compression of a mid thoracic vertebra. IMPRESSION: 1. Small bilateral effusions.  No focal consolidation. 2. Stable moderate cardiomegaly without overt edema 3. Extensive calcified pleural plaques Electronically Signed   By: Donavan Foil M.D.   On: 09/20/2016 21:19    EKG: Independently reviewed Vent. rate 116 BPM PR  interval * ms QRS duration 108 ms QT/QTc 350/486 ms P-R-T axes * -3 15   Assessment/Plan Principal Problem:   Sepsis due to undetermined organism [facility]) [place] telemetry/inpatient. Continue supplemental oxygen. Continue gentle and time-limited IV hydration. Continue vancomycin and Zosyn. Follow-up blood cultures and sensitivity  Active Problems:   Leukocytosis Secondary to sepsis and use of daily prednisone. Continue IV antibiotics for now. Monitor WBC.    Chronic  atrial fibrillation (HCC) CHA2DS2-VASc Score of at least 4. Cardiac monitoring. Continue metoprolol 12.5 mg by mouth twice a day for rate control Continue warfarin per pharmacy.    Chronic diastolic congestive heart failure ([place]) Compensated sedated. Continue metoprolol 12.5 mg by mouth twice a day. Hold diuretics for now. Monitor intake and output.    Hypokalemia Replaced. Follow-up potassium level. Magnesium was supplemented as well.    GERD (gastroesophageal reflux disease) Protonix 40 mg by mouth daily.        Chronic back pain Continue Norco as needed.    DVT prophylaxis: Warfarin. Code Status: Partial code. No intubation or electric cardioversion. Family Communication: His 2 daughters are present in the emergency department. Disposition Plan: Intravenous antibiotics for several days. Consults called:  Admission status: Inpatient/telemetry.   [name] MD [facility] Pager [phone].  If 7PM-7AM, please contact night-coverage [email or web address] Password [facility]  09/20/2016, 11:31 PM

## 2016-09-20 NOTE — ED Notes (Signed)
Patient transported to X-ray 

## 2016-09-21 ENCOUNTER — Encounter (HOSPITAL_COMMUNITY): Payer: Self-pay | Admitting: Internal Medicine

## 2016-09-21 DIAGNOSIS — R7881 Bacteremia: Secondary | ICD-10-CM | POA: Diagnosis present

## 2016-09-21 DIAGNOSIS — A4150 Gram-negative sepsis, unspecified: Secondary | ICD-10-CM

## 2016-09-21 DIAGNOSIS — D72829 Elevated white blood cell count, unspecified: Secondary | ICD-10-CM

## 2016-09-21 DIAGNOSIS — E876 Hypokalemia: Secondary | ICD-10-CM

## 2016-09-21 DIAGNOSIS — I10 Essential (primary) hypertension: Secondary | ICD-10-CM | POA: Diagnosis present

## 2016-09-21 DIAGNOSIS — Z89422 Acquired absence of other left toe(s): Secondary | ICD-10-CM

## 2016-09-21 LAB — CBC WITH DIFFERENTIAL/PLATELET
Basophils Absolute: 0 10*3/uL (ref 0.0–0.1)
Basophils Relative: 0 %
EOS PCT: 0 %
Eosinophils Absolute: 0 10*3/uL (ref 0.0–0.7)
HEMATOCRIT: 35.9 % — AB (ref 39.0–52.0)
Hemoglobin: 11.4 g/dL — ABNORMAL LOW (ref 13.0–17.0)
LYMPHS PCT: 4 %
Lymphs Abs: 0.8 10*3/uL (ref 0.7–4.0)
MCH: 31.9 pg (ref 26.0–34.0)
MCHC: 31.8 g/dL (ref 30.0–36.0)
MCV: 100.6 fL — AB (ref 78.0–100.0)
MONO ABS: 0.7 10*3/uL (ref 0.1–1.0)
MONOS PCT: 3 %
NEUTROS ABS: 19.2 10*3/uL — AB (ref 1.7–7.7)
Neutrophils Relative %: 93 %
PLATELETS: 187 10*3/uL (ref 150–400)
RBC: 3.57 MIL/uL — ABNORMAL LOW (ref 4.22–5.81)
RDW: 14.2 % (ref 11.5–15.5)
WBC: 20.7 10*3/uL — ABNORMAL HIGH (ref 4.0–10.5)

## 2016-09-21 LAB — BLOOD CULTURE ID PANEL (REFLEXED)
ACINETOBACTER BAUMANNII: NOT DETECTED
CANDIDA ALBICANS: NOT DETECTED
CANDIDA PARAPSILOSIS: NOT DETECTED
CARBAPENEM RESISTANCE: NOT DETECTED
Candida glabrata: NOT DETECTED
Candida krusei: NOT DETECTED
Candida tropicalis: NOT DETECTED
ENTEROBACTER CLOACAE COMPLEX: NOT DETECTED
ENTEROBACTERIACEAE SPECIES: NOT DETECTED
ENTEROCOCCUS SPECIES: NOT DETECTED
Escherichia coli: NOT DETECTED
HAEMOPHILUS INFLUENZAE: NOT DETECTED
KLEBSIELLA PNEUMONIAE: NOT DETECTED
Klebsiella oxytoca: NOT DETECTED
LISTERIA MONOCYTOGENES: NOT DETECTED
Neisseria meningitidis: NOT DETECTED
PSEUDOMONAS AERUGINOSA: DETECTED — AB
Proteus species: NOT DETECTED
STAPHYLOCOCCUS AUREUS BCID: NOT DETECTED
STREPTOCOCCUS AGALACTIAE: NOT DETECTED
STREPTOCOCCUS PNEUMONIAE: NOT DETECTED
STREPTOCOCCUS PYOGENES: NOT DETECTED
STREPTOCOCCUS SPECIES: NOT DETECTED
Serratia marcescens: NOT DETECTED
Staphylococcus species: NOT DETECTED

## 2016-09-21 LAB — BASIC METABOLIC PANEL
Anion gap: 10 (ref 5–15)
BUN: 13 mg/dL (ref 6–20)
CO2: 28 mmol/L (ref 22–32)
CREATININE: 0.8 mg/dL (ref 0.61–1.24)
Calcium: 8.3 mg/dL — ABNORMAL LOW (ref 8.9–10.3)
Chloride: 104 mmol/L (ref 101–111)
GFR calc Af Amer: >60 mL/min (ref >60)
GFR calc non Af Amer: >60 mL/min (ref >60)
GLUCOSE: 149 mg/dL — AB (ref 65–99)
Potassium: 3.2 mmol/L — ABNORMAL LOW (ref 3.5–5.1)
Sodium: 142 mmol/L (ref 135–145)

## 2016-09-21 LAB — PROTIME-INR
INR: 2.28
PROTHROMBIN TIME: 25.5 s — AB (ref 11.4–15.2)

## 2016-09-21 MED ORDER — PREDNISONE 10 MG PO TABS
10.0000 mg | ORAL_TABLET | Freq: Every morning | ORAL | Status: DC
  Administered 2016-09-21 – 2016-09-29 (×9): 10 mg via ORAL
  Filled 2016-09-21 (×9): qty 1

## 2016-09-21 MED ORDER — ESCITALOPRAM OXALATE 10 MG PO TABS
5.0000 mg | ORAL_TABLET | Freq: Every day | ORAL | Status: DC
  Administered 2016-09-21 – 2016-10-01 (×12): 5 mg via ORAL
  Filled 2016-09-21 (×12): qty 1

## 2016-09-21 MED ORDER — GUAIFENESIN ER 600 MG PO TB12
600.0000 mg | ORAL_TABLET | Freq: Two times a day (BID) | ORAL | Status: DC | PRN
  Administered 2016-09-26 – 2016-10-01 (×9): 600 mg via ORAL
  Filled 2016-09-21 (×9): qty 1

## 2016-09-21 MED ORDER — IPRATROPIUM BROMIDE 0.06 % NA SOLN: 2.0000 | Freq: Three times a day (TID) | NASAL | Status: DC | PRN

## 2016-09-21 MED ORDER — PIPERACILLIN-TAZOBACTAM 3.375 G IVPB 30 MIN: 3.3750 g | Freq: Three times a day (TID) | INTRAVENOUS | Status: DC

## 2016-09-21 MED ORDER — PIPERACILLIN-TAZOBACTAM 3.375 G IVPB
3.3750 g | Freq: Three times a day (TID) | INTRAVENOUS | Status: DC
  Administered 2016-09-21 – 2016-09-23 (×6): 3.375 g via INTRAVENOUS
  Filled 2016-09-21 (×8): qty 50

## 2016-09-21 MED ORDER — WARFARIN - PHARMACIST DOSING INPATIENT
Freq: Every day | Status: DC
  Administered 2016-09-24 – 2016-09-27 (×3)
  Administered 2016-09-29: 1
  Administered 2016-10-01: 0.5

## 2016-09-21 MED ORDER — PANTOPRAZOLE SODIUM 40 MG PO TBEC
40.0000 mg | DELAYED_RELEASE_TABLET | Freq: Every day | ORAL | Status: DC
  Administered 2016-09-21 – 2016-10-02 (×12): 40 mg via ORAL
  Filled 2016-09-21 (×13): qty 1

## 2016-09-21 MED ORDER — WARFARIN SODIUM 2.5 MG PO TABS
2.5000 mg | ORAL_TABLET | Freq: Once | ORAL | Status: AC
  Administered 2016-09-21: 2.5 mg via ORAL
  Filled 2016-09-21 (×2): qty 1

## 2016-09-21 MED ORDER — SODIUM CHLORIDE 0.9 % IV SOLN
30.0000 meq | Freq: Once | INTRAVENOUS | Status: AC
  Administered 2016-09-21: 30 meq via INTRAVENOUS
  Filled 2016-09-21: qty 15

## 2016-09-21 MED ORDER — POTASSIUM CHLORIDE IN NACL 20-0.9 MEQ/L-% IV SOLN
INTRAVENOUS | Status: AC
Start: 2016-09-21 — End: 2016-09-21
  Administered 2016-09-21: 01:00:00 via INTRAVENOUS
  Filled 2016-09-21: qty 1000

## 2016-09-21 MED ORDER — ONDANSETRON HCL 4 MG PO TABS
4.0000 mg | ORAL_TABLET | Freq: Four times a day (QID) | ORAL | Status: DC | PRN
  Administered 2016-09-24 – 2016-09-25 (×2): 4 mg via ORAL
  Filled 2016-09-21 (×2): qty 1

## 2016-09-21 MED ORDER — ACETAMINOPHEN 650 MG RE SUPP: 650.0000 mg | Freq: Four times a day (QID) | RECTAL | Status: DC | PRN

## 2016-09-21 MED ORDER — FLUTICASONE PROPIONATE 50 MCG/ACT NA SUSP
1.0000 | Freq: Two times a day (BID) | NASAL | Status: DC
  Administered 2016-09-21 – 2016-10-02 (×24): 1 via NASAL
  Filled 2016-09-21: qty 16

## 2016-09-21 MED ORDER — ONDANSETRON HCL 4 MG/2ML IJ SOLN
4.0000 mg | Freq: Four times a day (QID) | INTRAMUSCULAR | Status: DC | PRN
  Administered 2016-09-22 – 2016-09-24 (×2): 4 mg via INTRAVENOUS
  Filled 2016-09-21 (×3): qty 2

## 2016-09-21 MED ORDER — METOPROLOL TARTRATE 12.5 MG HALF TABLET
12.5000 mg | ORAL_TABLET | Freq: Two times a day (BID) | ORAL | Status: DC
  Administered 2016-09-21 – 2016-09-24 (×8): 12.5 mg via ORAL
  Filled 2016-09-21 (×8): qty 1

## 2016-09-21 MED ORDER — HYDROCODONE-ACETAMINOPHEN 10-325 MG PO TABS
1.0000 | ORAL_TABLET | Freq: Four times a day (QID) | ORAL | Status: DC | PRN
  Administered 2016-09-22 – 2016-09-30 (×8): 1 via ORAL
  Filled 2016-09-21 (×9): qty 1

## 2016-09-21 NOTE — Progress Notes (Signed)
ANTICOAGULATION CONSULT NOTE - Initial Consult  Pharmacy Consult for Coumadin Indication: atrial fibrillation  Allergies  Allergen Reactions  . Tape Other (See Comments)    PATIENT'S SKIN IS VERY THIN AND WILL TEAR AND BRUISE VERY EASILY!!    Vital Signs: Temp: 101.5 F (38.6 C) (02/23 2148) Temp Source: Rectal (02/23 2148) BP: 100/76 (02/23 2330) Pulse Rate: 121 (02/23 2330)  Labs:  Recent Labs  09/20/16 1948  HGB 12.1*  HCT 36.8*  PLT 223  CREATININE 0.78    Estimated Creatinine Clearance: 73.7 mL/min (by C-G formula based on SCr of 0.78 mg/dL).   Medical History: Past Medical History:  Diagnosis Date  . Atrial fibrillation ([place])   . Cellulitis    left arm  . CHF (congestive heart failure) ([place])   . Constipation   . Dysrhythmia    hx of atrial fibrilation  . GERD (gastroesophageal reflux disease)    ocassional  . History of kidney stones   . Myasthenia gravis   . Osteoarthritis   . Pneumonia    Hstory of  . Prostate cancer ([place]) dx'd 1982   treated with surgery and radiation.  Basal cell cancer  . Sepsis ([place])   . Shortness of breath dyspnea    with exertion  . Urine incontinence   . Varicose veins of left lower extremity     Medications:  Prescriptions Prior to Admission  Medication Sig Dispense Refill Last Dose  . Cholecalciferol (VITAMIN D-3 PO) Take 2,000 Units by mouth daily.    09/20/2016 at am  . escitalopram (LEXAPRO) 5 MG tablet Take 5 mg by mouth at bedtime.  3 09/19/2016 at pm  . FIBER PO Take 1 tablet by mouth 3 (three) times daily.    09/20/2016 at 1200  . fluticasone (FLONASE) 50 MCG/ACT nasal spray Place 1 spray into both nostrils 2 (two) times daily.    09/20/2016 at am  . furosemide (LASIX) 20 MG tablet Take 40 mg once daily for 5 days. Then resume 20 mg twice daily. (Patient taking differently: Take 20 mg by mouth 2 (two) times daily. ) 60 tablet 1 09/20/2016 at am  . guaiFENesin (MUCINEX) 600 MG 12 hr tablet Take 600 mg by mouth 2  (two) times daily as needed for to loosen phlegm.   PRN at PRN  . HYDROcodone-acetaminophen (NORCO) 10-325 MG per tablet Take 1 tablet by mouth every 6 (six) hours as needed (for pain).    09/20/2016 at 1830  . ipratropium (ATROVENT) 0.03 % nasal spray Place 2 sprays into both nostrils 3 (three) times daily as needed (for congestion).   09/20/2016 at pm  . metoprolol tartrate (LOPRESSOR) 25 MG tablet Take 12.5 mg by mouth 2 (two) times daily.    09/20/2016 at 0700  . predniSONE (DELTASONE) 10 MG tablet Take 2 tablets (20 mg total) by mouth daily. (Patient taking differently: Take 20 mg by mouth every morning. ) 180 tablet 3 09/20/2016 at 0700  . vitamin B-12 (CYANOCOBALAMIN) 1000 MCG tablet Take 1,000 mcg by mouth daily.   09/20/2016 at 1200  . warfarin (COUMADIN) 5 MG tablet Take as directed by coumadin clinic (Patient taking differently: Take 2.5-5 mg by mouth See admin instructions. 2.5 mg at lunch(time) on Sun/Tues/Thurs/Sat and 5 mg on Mon/Wed/Fri) 30 tablet 3 09/20/2016 at 1200  . HYDROcodone-acetaminophen (NORCO) 5-325 MG tablet Take 1 tablet by mouth every 6 (six) hours as needed. (Patient not taking: Reported on 09/20/2016) 30 tablet 0 Not Taking at Unknown time  Scheduled:  . magnesium sulfate 1 - 4 g bolus IVPB  2 g Intravenous Once  . potassium chloride (KCL MULTIRUN) 30 mEq in 265 mL IVPB  30 mEq Intravenous Once  . vancomycin  1,000 mg Intravenous Q12H  . Warfarin - Pharmacist Dosing Inpatient   Does not apply q1800   Infusions:  . 0.9 % NaCl with KCl 20 mEq / L      Assessment: 83yo male admitted w/ sepsis, to continue Coumadin for Afib; last dose of Coumadin taken 2/23 prior to arrival.  Goal of Therapy:  INR 2-3   Plan:  Will confirm current INR prior to dosing Coumadin.  Wynona Neat, PharmD, BCPS  09/21/2016,12:05 AM

## 2016-09-21 NOTE — Progress Notes (Signed)
   09/21/16 0014  Vitals  Temp 98.5 F (36.9 C)  Temp Source Oral  BP 101/69  BP Location Right Arm  BP Method Automatic  Patient Position (if appropriate) Lying  Pulse Rate 92  Pulse Rate Source Monitor  Oxygen Therapy  SpO2 94 %  O2 Device Nasal Cannula  O2 Flow Rate (L/min) 2 L/min  Height and Weight  Height 6\' 3"  (1.905 m)  Weight 76.8 kg (169 lb 4.8 oz) (bed scale)  Type of Scale Used Bed  Type of Weight Actual  BSA (Calculated - sq m) 2.02 sq meters  BMI (Calculated) 21.2  Weight in (lb) to have BMI = 25 199.6  Admitted pt to rm 3E24 from ED, pt alert and oriented, denied pain at this time, oriented to room, call bell placed within reach, placed on cardiac monitor, CCMD made aware. Admission assessment done, orders carried out.

## 2016-09-21 NOTE — Progress Notes (Addendum)
Patient ID: BRITTANY AMIRAULT, male   DOB: 08/21/1925, 83 y.o.   MRN: [id number]  PROGRESS NOTE    Bryne Lindon Burrough  BSJ:[id number] DOB: [birth year] DOA: 09/20/2016  PCP: [name], MD   Brief Narrative:  83 y.o. male with medical history significant for chronic atrial fibrillation on AC with coumadin, myasthenia gravis, prostate cancer, basal cell CA, recently underwent a left TMT amputation and is coming to the emergency department with nausea, dry heaves and chills since the morning of the admission.  On admission. BP was 90/48 and has improved with IV fluids to 159/109. HR was 121, RR 18-33, T max was 101.38F, oxygen saturation was 86% on room air but has improved to 95% with [place] oxygen support. Blood work was notable for WBC count 19.5, hemoglobin 12.1, potassium 2.8 which was supplemented. Lactic acid was 1.98. CXR showed small bilateral effusions but no focal consolidation. UA showed rare bacteria, no leukocytes. He was started on empiric broad spectrum abx until blood and urine cx results are back.   Assessment & Plan:   Active Problems:   Sepsis due to Gram negative bacteria / Gram-negative bacteremia / Leukocytosis - Sepsis criteria met on admission with fever, tachycardia, tachypnea, leukocytosis, lactic acidosis - Blood cx on admission growing GNR (one cx, but another bottle without growth) - Continue vanco an dzosyn - Foolow up final cx results - Obtain urine cx - Repeat blood cx in am to ensure clearance of bacteremia     Chronic atrial fibrillation (HCC) / Anticoagulated on Coumadin - CHADS vasc score 4 - On Anticoagulation with coumadin - Continue rate control with metoprolol    Myasthenia gravis (HCC) - On chronic steroids    Amputated toe, left (HCC) - Stable     Hypokalemia - Due to sepsis - Supplemented - Follow up BMP in am    Benign essential HTN - Continue metoprolol    DVT prophylaxis: on coumadin  Code Status: partial code, no intubation  Family  Communication: daughters at bedside this am Disposition Plan: not yet stable for discharge, home once stable; needs repeat blood cx in am   Consultants:   Palliative care   Procedures:   None   Antimicrobials:   Vanco and zosyn 09/20/2016 -->   Subjective: Restless.  Objective: Vitals:   09/21/16 1258 09/21/16 1331 09/21/16 1504 09/21/16 1505  BP: 109/81 115/84 (!) 91/58 94/60  Pulse:  (!) 103 90   Resp:  (!) 26  (!) 26  Temp:   98.7 F (37.1 C)   TempSrc:   Oral   SpO2:  95%    Weight:      Height:        Intake/Output Summary (Last 24 hours) at 09/21/16 1651 Last data filed at 09/21/16 1405  Gross per 24 hour  Intake           1472.5 ml  Output                0 ml  Net           1472.5 ml   Filed Weights   09/21/16 0014  Weight: 76.8 kg (169 lb 4.8 oz)    Examination:  General exam: Disoriented, restless in am and then in the afternoon more lethargic Respiratory system: Diminished but no wheezing  Cardiovascular system: S1 & S2 heard, irregular rhythm, tachycardic, soft SEM appreciated  Gastrointestinal system: Abdomen is nondistended, soft and nontender. No organomegaly or masses felt. Normal bowel sounds heard. Central  nervous system: o focal neurological deficits. Extremities: Symmetric 5 x 5 power. +1 LE pitting edema; Left transmetatarsal amputation Skin: No rashes, lesions or ulcers Psychiatry: Restless, not agitated    Data Reviewed: I have personally reviewed following labs and imaging studies  CBC:  Recent Labs Lab 09/20/16 1948 09/21/16 0426  WBC 19.5* 20.7*  NEUTROABS  --  19.2*  HGB 12.1* 11.4*  HCT 36.8* 35.9*  MCV 98.9 100.6*  PLT 223 426   Basic Metabolic Panel:  Recent Labs Lab 09/20/16 1948 09/20/16 2158 09/21/16 0426  NA 140  --  142  K 2.8*  --  3.2*  CL 100*  --  104  CO2 28  --  28  GLUCOSE 119*  --  149*  BUN 15  --  13  CREATININE 0.78  --  0.80  CALCIUM 8.9  --  8.3*  MG  --  1.7  --    GFR: Estimated  Creatinine Clearance: 69.3 mL/min (by C-G formula based on SCr of 0.8 mg/dL). Liver Function Tests:  Recent Labs Lab 09/20/16 1948  AST 20  ALT 14*  ALKPHOS 56  BILITOT 0.9  PROT 5.8*  ALBUMIN 3.3*    Recent Labs Lab 09/20/16 1948  LIPASE 18   No results for input(s): AMMONIA in the last 168 hours. Coagulation Profile:  Recent Labs Lab 09/21/16 0426  INR 2.28   Cardiac Enzymes: No results for input(s): CKTOTAL, CKMB, CKMBINDEX, TROPONINI in the last 168 hours. BNP (last 3 results) No results for input(s): PROBNP in the last 8760 hours. HbA1C: No results for input(s): HGBA1C in the last 72 hours. CBG: No results for input(s): GLUCAP in the last 168 hours. Lipid Profile: No results for input(s): CHOL, HDL, LDLCALC, TRIG, CHOLHDL, LDLDIRECT in the last 72 hours. Thyroid Function Tests: No results for input(s): TSH, T4TOTAL, FREET4, T3FREE, THYROIDAB in the last 72 hours. Anemia Panel: No results for input(s): VITAMINB12, FOLATE, FERRITIN, TIBC, IRON, RETICCTPCT in the last 72 hours. Urine analysis:    Component Value Date/Time   COLORURINE YELLOW 09/20/2016 2024   APPEARANCEUR HAZY (A) 09/20/2016 2024   LABSPEC 1.015 09/20/2016 2024   PHURINE 6.0 09/20/2016 2024   GLUCOSEU NEGATIVE 09/20/2016 2024   HGBUR MODERATE (A) 09/20/2016 2024   BILIRUBINUR NEGATIVE 09/20/2016 2024   KETONESUR NEGATIVE 09/20/2016 2024   PROTEINUR NEGATIVE 09/20/2016 2024   NITRITE NEGATIVE 09/20/2016 2024   LEUKOCYTESUR NEGATIVE 09/20/2016 2024   Sepsis Labs: @LABRCNTIP (procalcitonin:4,lacticidven:4)   Recent Results (from the past 240 hour(s))  Blood culture (routine x 2)     Status: None (Preliminary result)   Collection Time: 09/20/16  9:45 PM  Result Value Ref Range Status   Specimen Description BLOOD RIGHT FOREARM  Final   Special Requests BOTTLES DRAWN AEROBIC AND ANAEROBIC 5ML  Final   Culture NO GROWTH < 12 HOURS  Final   Report Status PENDING  Incomplete  Blood culture  (routine x 2)     Status: None (Preliminary result)   Collection Time: 09/20/16 10:05 PM  Result Value Ref Range Status   Specimen Description BLOOD RIGHT HAND  Final   Special Requests IN PEDIATRIC BOTTLE 2ML  Final   Culture  Setup Time   Final    GRAM NEGATIVE RODS IN PEDIATRIC BOTTLE Organism ID to follow    Culture NO GROWTH < 12 HOURS  Final   Report Status PENDING  Incomplete      Radiology Studies: Dg Chest 2 View  Result Date: 09/20/2016  1. Small bilateral effusions.  No focal consolidation. 2. Stable moderate cardiomegaly without overt edema 3. Extensive calcified pleural plaques Electronically Signed   By: Donavan Foil M.D.   On: 09/20/2016 21:19    Scheduled Meds: . escitalopram  5 mg Oral QHS  . fluticasone  1 spray Each Nare BID  . metoprolol tartrate  12.5 mg Oral BID  . pantoprazole  40 mg Oral Daily  . piperacillin-tazobac  3.375 g Intravenous Q8H  . predniSONE  10 mg Oral q morning - 10a  . vancomycin  1,000 mg Intravenous Q12H  . warfarin  2.5 mg Oral ONCE-1800   Continuous Infusions: . 0.9 % NaCl with KCl 20 mEq / L 50 mL/hr at 09/21/16 0057     LOS: 1 day    Time spent: 25 minutes  Greater than 50% of the time spent on counseling and coordinating the care.   Leisa Lenz, MD [facility] Pager [phone]  If 7PM-7AM, please contact night-coverage [email or web address] Password [facility] 09/21/2016, 4:51 PM

## 2016-09-21 NOTE — Progress Notes (Signed)
ANTICOAGULATION CONSULT NOTE - Follow Up Consult  Pharmacy Consult for warfarin Indication: atrial fibrillation  Allergies  Allergen Reactions  . Tape Other (See Comments)    PATIENT'S SKIN IS VERY THIN AND WILL TEAR AND BRUISE VERY EASILY!!    Patient Measurements: Height: 6\' 3"  (190.5 cm) Weight: 169 lb 4.8 oz (76.8 kg) (bed scale) IBW/kg (Calculated) : 84.5   Vital Signs: Temp: 98.9 F (37.2 C) (02/24 0552) Temp Source: Oral (02/24 0552) BP: 118/86 (02/24 1040) Pulse Rate: 110 (02/24 0552)  Labs:  Recent Labs  09/20/16 1948 09/21/16 0426  HGB 12.1* 11.4*  HCT 36.8* 35.9*  PLT 223 187  LABPROT  --  25.5*  INR  --  2.28  CREATININE 0.78 0.80    Estimated Creatinine Clearance: 69.3 mL/min (by C-G formula based on SCr of 0.8 mg/dL).   Assessment: 83 yo male presenting with nausea in context of recent TMT amputation. Pt on warfarin PTA for afib. INR therapeutic at 2.28. CBC stable, no bleeding noted.   PTA warfarin dose: 5 mg MWF, 2.5 mg all other days. Pt took last dose 2/23 PTA.   Patient started on ABX and prednisone.    Goal of Therapy:  INR 2-3 Monitor platelets by anticoagulation protocol: Yes   Plan:  Warfarin 2.5 mg (following home dosing schedule) Daily INR, CBC Monitor PO intake, DDI, s/sx bleeding  Carlean Jews, Pharm.D. PGY1 Pharmacy Resident 2/24/201812:11 PM Pager [phone]

## 2016-09-21 NOTE — Significant Event (Signed)
Rapid Response Event Note  Overview:  Called for 2nd set of eyes Time Called: 1335 Arrival Time: 1340 Event Type: Other (Comment)  Initial Focused Assessment:  Called by Rn for 2nd set of eyes.  As per RN, concerned 2/2 patient was very restless and now is not and is lethargic.  VS have been stable.  Patient is easily arouseable, but drifts back to sleep.  He is on [place] 2lpm.  Patient is slightly confused but is able to be re-oriented.  He is moving all extremities.  Patient denies, CP, pain or SOB.  He states he is sleepy and did not sleep well last night.     Interventions:  No RRT interventions-  Recommend calling MD to update on patient status.    Plan of Care (if not transferred):  RN to monitor and call if needed  Event Summary:  RN to call MD   at      at          [facility], [place]

## 2016-09-21 NOTE — Progress Notes (Signed)
Patient lethargic will respond to touch and voice, alert and oriented. Family at bedside. Low grade temp. Will notified MD and continue to monitor patient.

## 2016-09-21 NOTE — Progress Notes (Signed)
PHARMACY - PHYSICIAN COMMUNICATION CRITICAL VALUE ALERT - BLOOD CULTURE IDENTIFICATION (BCID)  Results for orders placed or performed during the hospital encounter of 09/20/16  Blood Culture ID Panel (Reflexed) (Collected: 09/20/2016 10:05 PM)  Result Value Ref Range   Enterococcus species NOT DETECTED NOT DETECTED   Listeria monocytogenes NOT DETECTED NOT DETECTED   Staphylococcus species NOT DETECTED NOT DETECTED   Staphylococcus aureus NOT DETECTED NOT DETECTED   Streptococcus species NOT DETECTED NOT DETECTED   Streptococcus agalactiae NOT DETECTED NOT DETECTED   Streptococcus pneumoniae NOT DETECTED NOT DETECTED   Streptococcus pyogenes NOT DETECTED NOT DETECTED   Acinetobacter baumannii NOT DETECTED NOT DETECTED   Enterobacteriaceae species NOT DETECTED NOT DETECTED   Enterobacter cloacae complex NOT DETECTED NOT DETECTED   Escherichia coli NOT DETECTED NOT DETECTED   Klebsiella oxytoca NOT DETECTED NOT DETECTED   Klebsiella pneumoniae NOT DETECTED NOT DETECTED   Proteus species NOT DETECTED NOT DETECTED   Serratia marcescens NOT DETECTED NOT DETECTED   Carbapenem resistance NOT DETECTED NOT DETECTED   Haemophilus influenzae NOT DETECTED NOT DETECTED   Neisseria meningitidis NOT DETECTED NOT DETECTED   Pseudomonas aeruginosa DETECTED (A) NOT DETECTED   Candida albicans NOT DETECTED NOT DETECTED   Candida glabrata NOT DETECTED NOT DETECTED   Candida krusei NOT DETECTED NOT DETECTED   Candida parapsilosis NOT DETECTED NOT DETECTED   Candida tropicalis NOT DETECTED NOT DETECTED    Name of physician (or Provider) Contacted: Dr. Charlies Silvers  Changes to prescribed antibiotics required: none, currently on Zosyn which should cover pseudomonas. Would recommend stopping vancomycin.   Vincenza Hews, PharmD, BCPS 09/21/2016, 5:53 PM

## 2016-09-22 DIAGNOSIS — Z7189 Other specified counseling: Secondary | ICD-10-CM

## 2016-09-22 DIAGNOSIS — Z515 Encounter for palliative care: Secondary | ICD-10-CM

## 2016-09-22 LAB — CBC
HCT: 33.8 % — ABNORMAL LOW (ref 39.0–52.0)
Hemoglobin: 10.7 g/dL — ABNORMAL LOW (ref 13.0–17.0)
MCH: 32.1 pg (ref 26.0–34.0)
MCHC: 31.7 g/dL (ref 30.0–36.0)
MCV: 101.5 fL — ABNORMAL HIGH (ref 78.0–100.0)
PLATELETS: 168 10*3/uL (ref 150–400)
RBC: 3.33 MIL/uL — AB (ref 4.22–5.81)
RDW: 14.5 % (ref 11.5–15.5)
WBC: 19.6 10*3/uL — AB (ref 4.0–10.5)

## 2016-09-22 LAB — URINE CULTURE: Culture: <10000 — AB

## 2016-09-22 LAB — BASIC METABOLIC PANEL
ANION GAP: 10 (ref 5–15)
BUN: 13 mg/dL (ref 6–20)
CALCIUM: 8.2 mg/dL — AB (ref 8.9–10.3)
CO2: 26 mmol/L (ref 22–32)
Chloride: 103 mmol/L (ref 101–111)
Creatinine, Ser: 0.78 mg/dL (ref 0.61–1.24)
Glucose, Bld: 122 mg/dL — ABNORMAL HIGH (ref 65–99)
POTASSIUM: 3.5 mmol/L (ref 3.5–5.1)
SODIUM: 139 mmol/L (ref 135–145)

## 2016-09-22 LAB — VANCOMYCIN, TROUGH: VANCOMYCIN TR: 12 ug/mL — AB (ref 15–20)

## 2016-09-22 LAB — PROTIME-INR
INR: 2.88
PROTHROMBIN TIME: 30.8 s — AB (ref 11.4–15.2)

## 2016-09-22 MED ORDER — VANCOMYCIN HCL 10 G IV SOLR
1250.0000 mg | Freq: Two times a day (BID) | INTRAVENOUS | Status: DC
  Administered 2016-09-22: 1250 mg via INTRAVENOUS
  Filled 2016-09-22 (×2): qty 1250

## 2016-09-22 MED ORDER — CALCIUM POLYCARBOPHIL 625 MG PO TABS
625.0000 mg | ORAL_TABLET | Freq: Three times a day (TID) | ORAL | Status: DC
  Administered 2016-09-22 – 2016-10-02 (×29): 625 mg via ORAL
  Filled 2016-09-22 (×32): qty 1

## 2016-09-22 MED ORDER — WARFARIN SODIUM 2 MG PO TABS
1.0000 mg | ORAL_TABLET | Freq: Once | ORAL | Status: AC
  Administered 2016-09-22: 1 mg via ORAL
  Filled 2016-09-22: qty 0.5

## 2016-09-22 MED ORDER — POLYETHYLENE GLYCOL 3350 17 G PO PACK
17.0000 g | PACK | Freq: Every day | ORAL | Status: DC
  Administered 2016-09-22 – 2016-10-01 (×6): 17 g via ORAL
  Filled 2016-09-22 (×8): qty 1

## 2016-09-22 NOTE — Progress Notes (Signed)
Pharmacy Antibiotic Note  Richard Bradley is a 83 y.o. male admitted on 09/20/2016 with pseudomonas aeruginosa bacteremia on BCID .  Pharmacy has been consulted for Vancomcyin. Pt also on zosyn per MD.   WBC remains elevated, afebrile, LA 1.98 as of 2/23.  VT 2/25 drawn appropriately, subtherapeutic at 12.   Plan: Vancomycin increase to 1250 mg IV q12h (goal trough 15-20) Follow up need for vancomycin as pseudomonas identified on BCID Will f/u micro data, renal function, and pt's clinical condition Vanc trough prn   Height: 6\' 3"  (190.5 cm) Weight: 171 lb 9.6 oz (77.8 kg) IBW/kg (Calculated) : 84.5  Temp (24hrs), Avg:98.5 F (36.9 C), Min:98.1 F (36.7 C), Max:99.5 F (37.5 C)   Recent Labs Lab 09/20/16 1948 09/20/16 2202 09/21/16 0426 09/22/16 0418 09/22/16 1027  WBC 19.5*  --  20.7* 19.6*  --   CREATININE 0.78  --  0.80 0.78  --   LATICACIDVEN  --  1.98*  --   --   --   VANCOTROUGH  --   --   --   --  12*    Estimated Creatinine Clearance: 70.2 mL/min (by C-G formula based on SCr of 0.78 mg/dL).    Allergies  Allergen Reactions  . Tape Other (See Comments)    PATIENT'S SKIN IS VERY THIN AND WILL TEAR AND BRUISE VERY EASILY!!    Antimicrobials this admission: 2/23 Zosyn >> 2/23 Vanc  >>   Dose adjustments this admission: 2/25 VT = 12 - increase to 1250 q12h ( conservative as 83 yo)  Microbiology results: 2/23 BCx x2: pseudomonas - BCID communicated to Dr. Doyle Askew  Thank you for allowing pharmacy to be a part of this patient's care.  Carlean Jews, Pharm.D. PGY1 Pharmacy Resident 2/25/201812:38 PM Pager [phone]

## 2016-09-22 NOTE — Progress Notes (Signed)
ANTICOAGULATION CONSULT NOTE - Follow Up Consult  Pharmacy Consult for warfarin Indication: atrial fibrillation  Allergies  Allergen Reactions  . Tape Other (See Comments)    PATIENT'S SKIN IS VERY THIN AND WILL TEAR AND BRUISE VERY EASILY!!    Patient Measurements: Height: 6\' 3"  (190.5 cm) Weight: 171 lb 9.6 oz (77.8 kg) IBW/kg (Calculated) : 84.5   Vital Signs: Temp: 98.3 F (36.8 C) (02/25 1207) Temp Source: Oral (02/25 1207) BP: 97/61 (02/25 1207) Pulse Rate: 100 (02/25 1207)  Labs:  Recent Labs  09/20/16 1948 09/21/16 0426 09/22/16 0418  HGB 12.1* 11.4* 10.7*  HCT 36.8* 35.9* 33.8*  PLT 223 187 168  LABPROT  --  25.5* 30.8*  INR  --  2.28 2.88  CREATININE 0.78 0.80 0.78    Estimated Creatinine Clearance: 70.2 mL/min (by C-G formula based on SCr of 0.78 mg/dL).   Assessment: 83 yo male presenting with nausea, recent TMT amputation. Pt on warfarin PTA for afib. INR therapeutic at 2.88 but with large jump. CBC stable, no bleeding noted. Pt noted to have poor PO intake. DDI: Patient started on ABX and prednisone.   PTA warfarin dose: 5 mg MWF, 2.5 mg all other days. Pt took last dose 2/23 PTA.   Goal of Therapy:  INR 2-3 Monitor platelets by anticoagulation protocol: Yes   Plan:  Warfarin 1 mg  Daily INR, CBC Monitor PO intake, DDI, s/sx bleeding  Carlean Jews, Pharm.D. PGY1 Pharmacy Resident 2/25/201812:39 PM Pager [phone]

## 2016-09-22 NOTE — Evaluation (Signed)
Physical Therapy Evaluation Patient Details Name: Richard Bradley MRN: [id number] DOB: 21-Dec-1925 Today's Date: 09/22/2016   History of Present Illness  83 y.o. male  with past medical history of chronic A. Fib on coumadin, dCHF, Myasthenia Gravis, Prostate Cancer, Basal Cell cancer, recent left TMT amputation, urolithiasis, and Osteoarthritis who presented to the ED with nausea, dry heaves, and chills for one day admitted for sepsis.  Clinical Impression  Patient demonstrates deficits in functional mobility as indicated below. Will need continued skilled PT to address deficits and maximize function. Will see as indicated and progress as tolerated.  Spoke with daughter at length, she performed chair to [facility] transfer with good technique. States that her plan is to bring her dad home following discharge and continue current assist.   Recommend HHPT upon acute discharge  OF NOtE: patient O2 saturations after activty dropped to 87% on 2 liters, increased to 3 liters and educated patient on use of IS as well as deep breathing. Nsg aware.      Follow Up Recommendations Home health PT;Supervision/Assistance - 24 hour    Equipment Recommendations  None recommended by PT    Recommendations for Other Services       Precautions / Restrictions Precautions Precautions: Fall Restrictions Weight Bearing Restrictions: Yes LLE Weight Bearing: Non weight bearing      Mobility  Bed Mobility Overal bed mobility: Needs Assistance Bed Mobility: Rolling;Sit to Supine Rolling: Supervision     Sit to supine: Min assist   General bed mobility comments: min assist to elevate BLE back to bed  Transfers Overall transfer level: Needs assistance Equipment used: 1 person hand held assist Transfers: Sit to/from [facility] Sit to Stand: Max assist Stand pivot transfers: Mod assist;+2 physical assistance       General transfer comment: Increased assist to elevate to standing and  maintain NWBing, pivot to [facility] from chair and to bed from [facility]  Ambulation/Gait                Stairs            Wheelchair Mobility    Modified Rankin (Stroke Patients Only)       Balance Overall balance assessment: Needs assistance   Sitting balance-Leahy Scale: Good     Standing balance support: Bilateral upper extremity supported;During functional activity (single LE) Standing balance-Leahy Scale: Zero                               Pertinent Vitals/Pain Pain Assessment: Faces Faces Pain Scale: Hurts even more Pain Location: RLE and back Pain Descriptors / Indicators: Aching;Discomfort;Grimacing;Guarding Pain Intervention(s): Limited activity within patient's tolerance;Monitored during session;Repositioned    Home Living Family/patient expects to be discharged to:: Private residence Living Arrangements: Children Available Help at Discharge: Family;Available PRN/intermittently Type of Home: House (Above garage apartment in daughter's home.) Home Access: Stairs to enter Entrance Stairs-Rails: Psychiatric nurse of Steps: 6 Home Layout: Multi-level Home Equipment: Environmental consultant - 2 wheels;Cane - single point;Bedside commode;Shower seat;Tub bench;Grab bars - tub/shower;Wheelchair - manual      Prior Function Level of Independence: Needs assistance   Gait / Transfers Assistance Needed: was using Rw prior to amputation, had recently beed in w/c since left foot amputation but could tranfer independently           Hand Dominance   Dominant Hand: Right    Extremity/Trunk Assessment   Upper Extremity Assessment Upper Extremity Assessment: Generalized  weakness (limited ROM due to arthritic shoulders)    Lower Extremity Assessment Lower Extremity Assessment: RLE deficits/detail;LLE deficits/detail RLE Deficits / Details: RLE with pocket of edema and fluid noted on right calf, increased pain and erythema RLE: Unable to fully assess  due to pain RLE Coordination: decreased fine motor;decreased gross motor LLE Deficits / Details: LLE foot amputation under bandage difficult to assess, hip flexion generally weak, knee flexion 4-/5 LLE Coordination: decreased fine motor;decreased gross motor       Communication      Cognition Arousal/Alertness: Awake/alert Behavior During Therapy: WFL for tasks assessed/performed Overall Cognitive Status: Impaired/Different from baseline Area of Impairment: Memory;Problem solving     Memory: Decreased short-term memory       Problem Solving: Slow processing;Decreased initiation;Difficulty sequencing;Requires verbal cues;Requires tactile cues      General Comments      Exercises     Assessment/Plan    PT Assessment Patient needs continued PT services  PT Problem List Decreased strength;Decreased activity tolerance;Decreased balance;Decreased mobility;Cardiopulmonary status limiting activity;Pain       PT Treatment Interventions DME instruction;Gait training;Functional mobility training;Therapeutic activities;Therapeutic exercise;Balance training;Patient/family education    PT Goals (Current goals can be found in the Care Plan section)  Acute Rehab PT Goals Patient Stated Goal: to go home with family assist PT Goal Formulation: With family Time For Goal Achievement: 10/06/16 Potential to Achieve Goals: Good    Frequency Min 3X/week   Barriers to discharge        Co-evaluation               End of Session Equipment Utilized During Treatment: Gait belt;Oxygen (2 liters, increased to 3 liters) Activity Tolerance: Patient tolerated treatment well;Patient limited by fatigue Patient left: in bed;with call bell/phone within reach;with family/visitor present Nurse Communication: Mobility status PT Visit Diagnosis: Muscle weakness (generalized) (M62.81);Difficulty in walking, not elsewhere classified (R26.2)         Time: [id number] PT Time Calculation (min)  (ACUTE ONLY): 25 min   Charges:   PT Evaluation $PT Eval Moderate Complexity: 1 Procedure PT Treatments $Therapeutic Activity: 8-22 mins   PT G Codes:         Duncan Dull 09-28-16, 5:42 PM

## 2016-09-22 NOTE — Consult Note (Signed)
Consultation Note Date: 09/22/2016   Patient Name: Richard Bradley  DOB: May 31, 1926  MRN: [id number]  Age / Sex: 83 y.o., male  PCP: Richard Contes, MD Referring Physician: Theodis Blaze, MD  Reason for Consultation: Establishing goals of care  HPI/Patient Profile: 83 y.o. male  with past medical history of chronic A. Fib on coumadin, dCHF, Myasthenia Gravis, Prostate Cancer, Basal Cell cancer, recent left TMT amputation, urolithiasis, and Osteoarthritis who presented to the ED with nausea, dry heaves, and chills for one day. He was febrile on presentation with lactic acid 1.98 and WBC 20.7. He was subsequently admitted on 09/20/2016 with sepsis. Blood culture returned positive for pseudomonas aeruginosa. Palliative was consulted for goals of care. Of note, he is a Partial Code with No defibrillation or intubation, but does want CPR, BiPAP, and ACLS medication.   Clinical Assessment and Goals of Care: I met Richard Bradley at his bedside. His daughter Richard Bradley was present. She is his HCPOA and he lives with her (she also happens to be a physical therapist specializing in [place]). Richard Bradley was a bit lethargic, but did wake to voice. When awake he was a little tangential in his responses, but did consistently and appropriately engage. Per his daughter, he was a still a little confused but better than when he was admitted. Of note, while he sleeps he is normally a bit restless and will consistently talk in his sleep. As he was generally sleeping for most of my visit, I had a great conversation with Richard Bradley about his clinical presentation and goals of care.  Richard Bradley understood he had an infection in his blood, which I explained a blood culture had returned positive for Pseudomonas aeruginosa. Interestingly, this was the same infection noted in a wound culture done this past December from his left surgical wound drainage. I reinforced with Richard Bradley that sensitivity data was  still pending, however the plan would be antibiotics and ongoing monitoring.   In terms of goals of care, Richard Bradley was very realistic that her father has multiple medical issues and is 83 years old. This is, in fact, his fourth episode of sepsis.  Prior to this hospitalization he was living with her and receiving support for bathing and dressing (non weight bearing on LLE due to surgery and healing wound), but was otherwise independent, doing his own taxes, and utilizing a wheelchair to move about his space. She is hopeful that he will improve with treatment back to that level of functioning (both mental and physical). She does, however, understand his baseline function may be worse given a systemic infection and the effect it can have on someone his age. In the event that he deteriorates-now or later-she does not want heroic measures that would prolong his suffering. To that end, we discussed code status and she wants a rapid response called and ACLS medications administered("anything through the IV"), but does not want BiPAP, CPR, defibrillation, or intubation.   Primary Decision Maker Pt when he is awake and alert, presently his daughter Richard Bradley [facility])   SUMMARY OF RECOMMENDATIONS    Partial code: Yes-rapid response and ACLS medication; No-BiPAP, CPR, defibrillation, intubation  Continue to treat what is treatable  Wound consult to assess and re-dress left foot wound  **Clear goals of care and pt has no uncontrolled symptoms. Palliative will continue to chart check and will re-engage as needed. Please call the team phone at [phone] for specific questions or concerns.   Code Status/Advance Care Planning:  Limited code  Symptom Management:   Pt reports his pain is well managed with PRN Norco (pain is primarily a chronic back pain)  Palliative Prophylaxis:   Frequent Pain Assessment and Oral Care  Additional Recommendations (Limitations, Scope, Preferences):  Full Scope  Treatment  Psycho-social/Spiritual:   Desire for further Chaplaincy support:no  Prognosis:   Unable to determine  Discharge Planning: To Be Determined      Primary Diagnoses: Present on Admission: . Chronic atrial fibrillation ([place]) . Myasthenia gravis ([place]) . Sepsis due to Gram negative bacteria ([place]) . Gram-negative bacteremia . Leukocytosis . Hypokalemia . Benign essential HTN   I have reviewed the medical record, interviewed the patient and family, and examined the patient. The following aspects are pertinent.  Past Medical History:  Diagnosis Date  . Atrial fibrillation ([place])   . Cellulitis    left arm  . CHF (congestive heart failure) ([place])   . Constipation   . Dysrhythmia    hx of atrial fibrilation  . GERD (gastroesophageal reflux disease)    ocassional  . History of kidney stones   . Myasthenia gravis   . Osteoarthritis   . Pneumonia    Hstory of  . 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 Prostate cancer ([place]) dx'd 1982   treated with surgery and radiation.  Basal cell cancer  . Sepsis ([place])   . Shortness of breath dyspnea    with exertion  . Urine incontinence   . Varicose veins of left lower extremity    Social History   Social History  . Marital status: Widowed    Spouse name: N/A  . Number of children: 2  . Years of education: 12   Occupational History  . Retired [employer]   Social History Main Topics  . Smoking status: Never Smoker  . Smokeless tobacco: Never Used  . Alcohol use 0.0 oz/week     Comment: occasional beer  . Drug use: No  . Sexual activity: Not Asked     Comment: Widowed   Other Topics Concern  . None   Social History Narrative   From [place], moving to [place]   Widowed   Right-handed   Caffeine: 1 cup of coffee per day   Family History  Problem Relation Age of Onset  . Heart disease Father   . Heart attack Father   . Hypertension Father   . Heart  disease Brother   . Heart disease Paternal Grandfather   . Stroke Mother   . Parkinsonism Sister   . Heart disease Sister   . Dementia Sister    Scheduled Meds: . escitalopram  5 mg Oral QHS  . fluticasone  1 spray Each Nare BID  . metoprolol tartrate  12.5 mg Oral BID  . pantoprazole  40 mg Oral Daily  . piperacillin-tazobactam (ZOSYN)  IV  3.375 g Intravenous Q8H  . predniSONE  10 mg Oral q morning - 10a  . vancomycin  1,000 mg Intravenous Q12H  . Warfarin - Pharmacist Dosing Inpatient   Does not apply q1800   Continuous Infusions: PRN Meds:.acetaminophen, guaiFENesin, HYDROcodone-acetaminophen, ipratropium, ondansetron **OR** ondansetron (ZOFRAN) IV Allergies  Allergen Reactions  . Tape Other (See Comments)    PATIENT'S SKIN IS VERY THIN AND WILL TEAR AND BRUISE VERY EASILY!!   Review of Systems  Constitutional: Positive for activity change, appetite change (no appetite d/tnot feeling well) and fatigue. Negative for chills and fever.  HENT: Negative for congestion, facial swelling, hearing loss,  sore throat and trouble swallowing.   Eyes: Negative for visual disturbance.  Respiratory: Negative for chest tightness and shortness of breath.   Gastrointestinal: Positive for constipation (chronic constipation). Negative for abdominal distention, abdominal pain, nausea and vomiting.  Genitourinary: Negative for difficulty urinating.  Musculoskeletal: Positive for back pain (chronic) and gait problem (non weight bearing on LLE).  Skin: Positive for pallor.  Allergic/Immunologic: Positive for immunocompromised state (immunosupressed d/t chronic prednisone use r/t MG).  Neurological: Positive for weakness. Negative for dizziness and speech difficulty.  Psychiatric/Behavioral: Positive for confusion. Negative for agitation, hallucinations and sleep disturbance. The patient is nervous/anxious.    Physical Exam  Constitutional: He appears well-developed and well-nourished. No distress.   HENT:  Head: Normocephalic and atraumatic.  Mouth/Throat: No oropharyngeal exudate.  Eyes: EOM are normal.  Neck: Normal range of motion.  Cardiovascular: An irregularly irregular rhythm present. Tachycardia present.   Pulmonary/Chest: Effort normal. No respiratory distress. He has no wheezes.  Musculoskeletal:  Contractures in bilateral hands. Left foot with prior amputations and surgery, in bandage with dried blood noted on wrapping.  Neurological: He is alert.  Oriented to self and time. Confused on situation.  Skin: Skin is warm and dry.  Right lower leg with erythema and tenderness. Skin taught with mild pitting. Left foot with bandaged. Paper thin skin.  Psychiatric: He has a normal mood and affect. Judgment and thought content normal. His speech is delayed and tangential. He is slowed. Cognition and memory are impaired.    Vital Signs: BP 104/71 (BP Location: Right Arm)   Pulse (!) 105   Temp 98.1 F (36.7 C) (Oral)   Resp 20   Ht 6' 3" (1.905 m)   Wt 77.8 kg (171 lb 9.6 oz)   SpO2 98%   BMI 21.45 kg/m  Pain Assessment: No/denies pain   Pain Score: 0-No pain   SpO2: SpO2: 98 % O2 Device:SpO2: 98 % O2 Flow Rate: .O2 Flow Rate (L/min): 2 L/min  IO: Intake/output summary:  Intake/Output Summary (Last 24 hours) at 09/22/16 0759 Last data filed at 09/22/16 0700  Gross per 24 hour  Intake             1460 ml  Output              900 ml  Net              560 ml    LBM: Last BM Date: 09/20/16 Baseline Weight: Weight: 76.8 kg (169 lb 4.8 oz) (bed scale) Most recent weight: Weight: 77.8 kg (171 lb 9.6 oz)     Palliative Assessment/Data: PPS 60%    Time Total: 70 minutes Greater than 50%  of this time was spent counseling and coordinating care related to the above assessment and plan.  Signed by: Charlynn Court, NP Palliative Medicine Team Pager # [phone] (M-F 7a-5p) Team Phone # [phone] (Nights/Weekends)

## 2016-09-22 NOTE — Progress Notes (Signed)
Patient ID: Richard Bradley, male   DOB: [birth year], 83 y.o.   MRN: [id number]  PROGRESS NOTE  Richard Bradley  SWH:[id number] DOB: [birth year] DOA: 09/20/2016  PCP: [name], MD  Brief Narrative:  83 y.o. male with medical history significant for chronic atrial fibrillation on AC with coumadin, myasthenia gravis, prostate cancer, basal cell CA, recently underwent a left TMT amputation and is coming to the emergency department with nausea, dry heaves and chills since the morning of the admission.   On admission. BP was 90/48 and has improved with IV fluids to 159/109. HR was 121, RR 18-33, T max was 101.13F, oxygen saturation was 86% on room air but has improved to 95% with [place] oxygen support. Blood work was notable for WBC count 19.5, hemoglobin 12.1, potassium 2.8 which was supplemented. Lactic acid was 1.98. CXR showed small bilateral effusions but no focal consolidation. UA showed rare bacteria, no leukocytes. He was started on empiric broad spectrum abx until blood and urine cx results are back.   Assessment & Plan:   Active Problems:   Sepsis due to pseudomonas bacteremia, source unclear at this time, RLE cellulitis  - Sepsis criteria met on admission with fever, tachycardia, tachypnea, leukocytosis, lactic acidosis - Blood cx on admission growing pseudomonas, final culture report pending  - Continue vanco and zosyn for now, may be able to stop vancomycin  - Foolow up final cx results - Repeat blood cx obtained 2/24 to ensure clearance of bacteremia, so far negative repeat cultures     RLE cellulitis - current ABX should be adequate in coverage     Chronic atrial fibrillation (HCC) / Anticoagulated on Coumadin - CHADS vasc score 4 - On Anticoagulation with coumadin - Continue rate control with metoprolol    Myasthenia gravis ([place]) - On chronic steroids    Amputated toe, left (HCC) - Stable     Hypokalemia - Due to sepsis - Supplemented - Follow up BMP in am    Benign  essential HTN - Continue metoprolol   DVT prophylaxis: on coumadin  Code Status: partial code, no intubation  Family Communication: daughter at bedside this am Disposition Plan: not yet stable for discharge, home once stable  Consultants:   Palliative care   Procedures:   None   Antimicrobials:   Vanco and zosyn 09/20/2016 -->  Subjective: Restless.  Objective: Vitals:   09/21/16 2100 09/21/16 2300 09/22/16 0453 09/22/16 1207  BP: (!) 91/51 116/80 104/71 97/61  Pulse: 81 94 (!) 105 100  Resp: _0 Temp: 99.5 F (37.5 C) 98.1 F (36.7 C) 98.1 F (36.7 C) 98.3 F (36.8 C)  TempSrc: Axillary Oral Oral Oral  SpO2: 94% 96% 98% 96%  Weight:   77.8 kg (171 lb 9.6 oz)   Height:        Intake/Output Summary (Last 24 hours) at 09/22/16 1945 Last data filed at 09/22/16 1847  Gross per 24 hour  Intake             1470 ml  Output             1300 ml  Net              170 ml   Filed Weights   09/21/16 0014 09/22/16 0453  Weight: 76.8 kg (169 lb 4.8 oz) 77.8 kg (171 lb 9.6 oz)    Examination:  General exam: Disoriented, restless in am and then in the afternoon more lethargic Respiratory system: Diminished  air movement at bases but no wheezing  Cardiovascular system: S1 & S2 heard, irregular rhythm, tachycardic, soft SEM appreciated  Gastrointestinal system: Abdomen is nondistended, soft and nontender. No organomegaly or masses felt. Central nervous system: o focal neurological deficits. Extremities: Symmetric 5 x 5 power. +1 LE pitting edema; Left transmetatarsal amputation, RLE calf area with erythema and edema and warmth to touch Skin: No rashes, lesions or ulcers Psychiatry: Restless, not agitated    Data Reviewed: I have personally reviewed following labs and imaging studies  CBC:  Recent Labs Lab 09/20/16 1948 09/21/16 0426 09/22/16 0418  WBC 19.5* 20.7* 19.6*  NEUTROABS  --  19.2*  --   HGB 12.1* 11.4* 10.7*  HCT 36.8* 35.9* 33.8*  MCV 98.9  100.6* 101.5*  PLT 223 187 235   Basic Metabolic Panel:  Recent Labs Lab 09/20/16 1948 09/20/16 2158 09/21/16 0426 09/22/16 0418  NA 140  --  142 139  K 2.8*  --  3.2* 3.5  CL 100*  --  104 103  CO2 28  --  28 26  GLUCOSE 119*  --  149* 122*  BUN 15  --  13 13  CREATININE 0.78  --  0.80 0.78  CALCIUM 8.9  --  8.3* 8.2*  MG  --  1.7  --   --    Liver Function Tests:  Recent Labs Lab 09/20/16 1948  AST 20  ALT 14*  ALKPHOS 56  BILITOT 0.9  PROT 5.8*  ALBUMIN 3.3*    Recent Labs Lab 09/20/16 1948  LIPASE 18   Coagulation Profile:  Recent Labs Lab 09/21/16 0426 09/22/16 0418  INR 2.28 2.88   Urine analysis:    Component Value Date/Time   COLORURINE YELLOW 09/20/2016 2024   APPEARANCEUR HAZY (A) 09/20/2016 2024   LABSPEC 1.015 09/20/2016 2024   PHURINE 6.0 09/20/2016 2024   GLUCOSEU NEGATIVE 09/20/2016 2024   HGBUR MODERATE (A) 09/20/2016 2024   [place] NEGATIVE 09/20/2016 2024   KETONESUR NEGATIVE 09/20/2016 2024   PROTEINUR NEGATIVE 09/20/2016 2024   NITRITE NEGATIVE 09/20/2016 2024   LEUKOCYTESUR NEGATIVE 09/20/2016 2024   Recent Results (from the past 240 hour(s))  Blood culture (routine x 2)     Status: None (Preliminary result)   Collection Time: 09/20/16  9:45 PM  Result Value Ref Range Status   Specimen Description BLOOD RIGHT FOREARM  Final   Special Requests BOTTLES DRAWN AEROBIC AND ANAEROBIC 5ML  Final   Culture NO GROWTH 2 DAYS  Final   Report Status PENDING  Incomplete  Blood culture (routine x 2)     Status: Abnormal (Preliminary result)   Collection Time: 09/20/16 10:05 PM  Result Value Ref Range Status   Specimen Description BLOOD RIGHT HAND  Final   Special Requests IN PEDIATRIC BOTTLE 2ML  Final   Culture  Setup Time   Final    GRAM NEGATIVE RODS IN PEDIATRIC BOTTLE CRITICAL RESULT CALLED TO, READ BACK BY AND VERIFIED WITH: A. JOHNSTON, [place], 09/21/16 AT 1733 BY J FUDESCO    Culture (A)  Final    PSEUDOMONAS  AERUGINOSA SUSCEPTIBILITIES TO FOLLOW    Report Status PENDING  Incomplete  Blood Culture ID Panel (Reflexed)     Status: Abnormal   Collection Time: 09/20/16 10:05 PM  Result Value Ref Range Status   Enterococcus species NOT DETECTED NOT DETECTED Final   Listeria monocytogenes NOT DETECTED NOT DETECTED Final   Staphylococcus species NOT DETECTED NOT DETECTED Final   Staphylococcus aureus  NOT DETECTED NOT DETECTED Final   Streptococcus species NOT DETECTED NOT DETECTED Final   Streptococcus agalactiae NOT DETECTED NOT DETECTED Final   Streptococcus pneumoniae NOT DETECTED NOT DETECTED Final   Streptococcus pyogenes NOT DETECTED NOT DETECTED Final   Acinetobacter baumannii NOT DETECTED NOT DETECTED Final   Enterobacteriaceae species NOT DETECTED NOT DETECTED Final   Enterobacter cloacae complex NOT DETECTED NOT DETECTED Final   Escherichia coli NOT DETECTED NOT DETECTED Final   Klebsiella oxytoca NOT DETECTED NOT DETECTED Final   Klebsiella pneumoniae NOT DETECTED NOT DETECTED Final   Proteus species NOT DETECTED NOT DETECTED Final   Serratia marcescens NOT DETECTED NOT DETECTED Final   Carbapenem resistance NOT DETECTED NOT DETECTED Final   Haemophilus influenzae NOT DETECTED NOT DETECTED Final   Neisseria meningitidis NOT DETECTED NOT DETECTED Final   Pseudomonas aeruginosa DETECTED (A) NOT DETECTED Final    Comment: CRITICAL RESULT CALLED TO, READ BACK BY AND VERIFIED WITH: A. JOHNSTON, [facility], 09/21/16 AT 1733 BY J FUDESCO    Candida albicans NOT DETECTED NOT DETECTED Final   Candida glabrata NOT DETECTED NOT DETECTED Final   Candida krusei NOT DETECTED NOT DETECTED Final   Candida parapsilosis NOT DETECTED NOT DETECTED Final   Candida tropicalis NOT DETECTED NOT DETECTED Final  Urine culture     Status: Abnormal   Collection Time: 09/21/16  3:08 PM  Result Value Ref Range Status   Specimen Description URINE, RANDOM  Final   Special Requests NONE  Final   Culture <10,000  COLONIES/mL INSIGNIFICANT GROWTH (A)  Final   Report Status 09/22/2016 FINAL  Final      Radiology Studies: Dg Chest 2 View  Result Date: 09/20/2016 1. Small bilateral effusions.  No focal consolidation. 2. Stable moderate cardiomegaly without overt edema 3. Extensive calcified pleural plaques Electronically Signed   By: Donavan Foil M.D.   On: 09/20/2016 21:19    Scheduled Meds: . escitalopram  5 mg Oral QHS  . fluticasone  1 spray Each Nare BID  . metoprolol tartrate  12.5 mg Oral BID  . pantoprazole  40 mg Oral Daily  . piperacillin-tazobac  3.375 g Intravenous Q8H  . predniSONE  10 mg Oral q morning - 10a  . vancomycin  1,000 mg Intravenous Q12H  . warfarin  2.5 mg Oral ONCE-1800   Continuous Infusions:    LOS: 2 days    Time spent: 25 minutes  Greater than 50% of the time spent on counseling and coordinating the care.  Faye Ramsay, MD [facility] Pager [phone]  If 7PM-7AM, please contact night-coverage [email or web address] Password [email or web address] 09/22/2016, 7:45 PM

## 2016-09-23 ENCOUNTER — Telehealth: Payer: Self-pay | Admitting: Interventional Cardiology

## 2016-09-23 ENCOUNTER — Telehealth (INDEPENDENT_AMBULATORY_CARE_PROVIDER_SITE_OTHER): Payer: Self-pay | Admitting: Orthopedic Surgery

## 2016-09-23 DIAGNOSIS — G7000 Myasthenia gravis without (acute) exacerbation: Secondary | ICD-10-CM

## 2016-09-23 LAB — CBC
HEMATOCRIT: 34.6 % — AB (ref 39.0–52.0)
Hemoglobin: 10.8 g/dL — ABNORMAL LOW (ref 13.0–17.0)
MCH: 32 pg (ref 26.0–34.0)
MCHC: 31.2 g/dL (ref 30.0–36.0)
MCV: 102.4 fL — ABNORMAL HIGH (ref 78.0–100.0)
PLATELETS: 176 10*3/uL (ref 150–400)
RBC: 3.38 MIL/uL — ABNORMAL LOW (ref 4.22–5.81)
RDW: 14.6 % (ref 11.5–15.5)
WBC: 18.4 10*3/uL — ABNORMAL HIGH (ref 4.0–10.5)

## 2016-09-23 LAB — CULTURE, BLOOD (ROUTINE X 2)

## 2016-09-23 LAB — BASIC METABOLIC PANEL
ANION GAP: 6 (ref 5–15)
BUN: 13 mg/dL (ref 6–20)
CALCIUM: 8.2 mg/dL — AB (ref 8.9–10.3)
CO2: 30 mmol/L (ref 22–32)
Chloride: 102 mmol/L (ref 101–111)
Creatinine, Ser: 0.79 mg/dL (ref 0.61–1.24)
Glucose, Bld: 110 mg/dL — ABNORMAL HIGH (ref 65–99)
POTASSIUM: 3.1 mmol/L — AB (ref 3.5–5.1)
Sodium: 138 mmol/L (ref 135–145)

## 2016-09-23 LAB — PROTIME-INR
INR: 2.74
Prothrombin Time: 29.5 seconds — ABNORMAL HIGH (ref 11.4–15.2)

## 2016-09-23 MED ORDER — WARFARIN SODIUM 2.5 MG PO TABS
2.5000 mg | ORAL_TABLET | Freq: Once | ORAL | Status: AC
  Administered 2016-09-23: 2.5 mg via ORAL
  Filled 2016-09-23: qty 1

## 2016-09-23 MED ORDER — CIPROFLOXACIN HCL 500 MG PO TABS
750.0000 mg | ORAL_TABLET | Freq: Two times a day (BID) | ORAL | Status: DC
  Administered 2016-09-23: 750 mg via ORAL
  Filled 2016-09-23: qty 2

## 2016-09-23 NOTE — Telephone Encounter (Signed)
Per pt's dtr -pt in hosptial and will be getting Potassium-told he needs it checked this week-does he have to see Tamala Julian or can he get labs only? Need order if so

## 2016-09-23 NOTE — Telephone Encounter (Signed)
I spoke with pt's daughter.  She reports pt is currently in the hospital and will need potassium checked after discharge. She is asking if it should be checked here.  Pt is not being followed by cardiology in hospital.  I told her we did not usually draw lab work in our office unless ordered by cardiology.  I told her pt would be given an order at time of discharge and labs could be done at [facility] or primary care.  She is asking if pt needs to see Dr. Tamala Julian for potassium management and I told her this could be followed by primary care.

## 2016-09-23 NOTE — Progress Notes (Signed)
Physical Therapy Treatment Patient Details Name: Richard Bradley MRN: [id number] DOB: 1925-10-02 Today's Date: 09/23/2016    History of Present Illness 83 y.o. male  with past medical history of chronic A. Fib on coumadin, dCHF, Myasthenia Gravis, Prostate Cancer, Basal Cell cancer, recent left TMT amputation, urolithiasis, and Osteoarthritis who presented to the ED with nausea, dry heaves, and chills for one day admitted for sepsis.    PT Comments    Pt making slow progress. Limited by quick fatigue and NWB on LLE.   Follow Up Recommendations  Home health PT;Supervision/Assistance - 24 hour     Equipment Recommendations  None recommended by PT    Recommendations for Other Services       Precautions / Restrictions Precautions Precautions: Fall Restrictions Weight Bearing Restrictions: Yes LLE Weight Bearing: Non weight bearing    Mobility  Bed Mobility               General bed mobility comments: Pt up in chair  Transfers Overall transfer level: Needs assistance Equipment used: Rolling walker (2 wheeled) Transfers: Sit to/from Stand Sit to Stand: +2 physical assistance;Mod assist         General transfer comment: Assist to bring hips up and to keep weight off LLE. Stood x 2.  Ambulation/Gait                 Stairs            Wheelchair Mobility    Modified Rankin (Stroke Patients Only)       Balance Overall balance assessment: Needs assistance Sitting-balance support: No upper extremity supported Sitting balance-Leahy Scale: Good     Standing balance support: Bilateral upper extremity supported;During functional activity (single LE) Standing balance-Leahy Scale: Zero Standing balance comment: Stood x 2 with +2 mod A for 20-30 sec                    Cognition Arousal/Alertness: Awake/alert Behavior During Therapy: WFL for tasks assessed/performed Overall Cognitive Status: Within Functional Limits for tasks assessed                       Exercises General Exercises - Upper Extremity Shoulder Flexion: AROM;Both (Instructed pt to perform x 10 when rested) Chair Push Up: Strengthening;Both;10 reps;Seated    General Comments        Pertinent Vitals/Pain Pain Assessment: Faces Faces Pain Scale: Hurts even more Pain Location: RLE  Pain Descriptors / Indicators: Aching;Discomfort;Grimacing;Guarding Pain Intervention(s): Limited activity within patient's tolerance;Monitored during session    Home Living                      Prior Function            PT Goals (current goals can now be found in the care plan section) Progress towards PT goals: Progressing toward goals    Frequency    Min 3X/week      PT Plan Current plan remains appropriate    Co-evaluation             End of Session Equipment Utilized During Treatment: Gait belt;Oxygen Activity Tolerance: Patient limited by fatigue Patient left: with call bell/phone within reach;with family/visitor present;in chair   PT Visit Diagnosis: Muscle weakness (generalized) (M62.81);Difficulty in walking, not elsewhere classified (R26.2)     Time: [id number] PT Time Calculation (min) (ACUTE ONLY): 20 min  Charges:  $Therapeutic Activity: 8-22 mins  G CodesShary Decamp [place] 09/23/2016, 4:13 PM [place]

## 2016-09-23 NOTE — Telephone Encounter (Signed)
Richard Bradley called to let dr.duda know that patient is in the hospital, she is requesting for dr.duda to come by and check on his post op site to make sure he is healing. His 2 wk post op wouldve been Wednesday. Cb#: [phone]

## 2016-09-23 NOTE — Consult Note (Signed)
[facility] Nurse wound consult note Reason for Consult: Consult requested for left foot.  Pt had toe amputation surgery by Dr Sharol Given on 2/14, and was due for a follow-up appointment tomorrow for wound assessment and suture removal, which he will not be able to attend related to this hospital admission. Wound type: Full thickness post-op wound to left foot Wound bed: Sutures intact and well -approximated.  No erythremia, fluctuance, or drainage.  Small amt old dried blood to outer sutures. Dressing procedure/placement/frequency: Continue present plan of care with dry gauze dressing and ace wrap. Discussed plan of care with patient and he verbalized understanding. Pt was due to have a post-op appointment with ortho service tomorrow.  Please consult Dr Sharol Given to assess post-op wound to left foot while in the hospital.   Please re-consult if further assistance is needed.  Thank-you,  Julien Girt MSN, [place], [place], [place], [place]

## 2016-09-23 NOTE — Progress Notes (Signed)
PHARMACY - PHYSICIAN COMMUNICATION CRITICAL VALUE ALERT - BLOOD CULTURE IDENTIFICATION (BCID)  Results for orders placed or performed during the hospital encounter of 09/20/16  Blood Culture ID Panel (Reflexed) (Collected: 09/20/2016 10:05 PM)  Result Value Ref Range   Enterococcus species NOT DETECTED NOT DETECTED   Listeria monocytogenes NOT DETECTED NOT DETECTED   Staphylococcus species NOT DETECTED NOT DETECTED   Staphylococcus aureus NOT DETECTED NOT DETECTED   Streptococcus species NOT DETECTED NOT DETECTED   Streptococcus agalactiae NOT DETECTED NOT DETECTED   Streptococcus pneumoniae NOT DETECTED NOT DETECTED   Streptococcus pyogenes NOT DETECTED NOT DETECTED   Acinetobacter baumannii NOT DETECTED NOT DETECTED   Enterobacteriaceae species NOT DETECTED NOT DETECTED   Enterobacter cloacae complex NOT DETECTED NOT DETECTED   Escherichia coli NOT DETECTED NOT DETECTED   Klebsiella oxytoca NOT DETECTED NOT DETECTED   Klebsiella pneumoniae NOT DETECTED NOT DETECTED   Proteus species NOT DETECTED NOT DETECTED   Serratia marcescens NOT DETECTED NOT DETECTED   Carbapenem resistance NOT DETECTED NOT DETECTED   Haemophilus influenzae NOT DETECTED NOT DETECTED   Neisseria meningitidis NOT DETECTED NOT DETECTED   Pseudomonas aeruginosa DETECTED (A) NOT DETECTED   Candida albicans NOT DETECTED NOT DETECTED   Candida glabrata NOT DETECTED NOT DETECTED   Candida krusei NOT DETECTED NOT DETECTED   Candida parapsilosis NOT DETECTED NOT DETECTED   Candida tropicalis NOT DETECTED NOT DETECTED    Name of physician (or Provider) Contacted: Amin  Changes to prescribed antibiotics required: narrow to zosyn  [employer]. Diona Foley, PharmD, BCPS Clinical Pharmacist [phone] 09/23/2016  8:50 AM

## 2016-09-23 NOTE — Progress Notes (Signed)
Patient ID: Richard Bradley, male   DOB: Dec 03, 1925, 82 y.o.   MRN: [id number]  PROGRESS NOTE  Cullin Dishman Channell  UVO:[id number] DOB: Dec 06, 1925 DOA: 09/20/2016  PCP: [name], MD  Brief Narrative:  83 y.o. male with medical history significant for chronic atrial fibrillation on AC with coumadin, myasthenia gravis, prostate cancer, basal cell CA, recently underwent a left TMT amputation and is coming to the emergency department with nausea, dry heaves and chills since the morning of the admission.   On admission. BP was 90/48 and has improved with IV fluids to 159/109. HR was 121, RR 18-33, T max was 101.61F, oxygen saturation was 86% on room air but has improved to 95% with [place] oxygen support. Blood work was notable for WBC count 19.5, hemoglobin 12.1, potassium 2.8 which was supplemented. Lactic acid was 1.98. CXR showed small bilateral effusions but no focal consolidation. UA showed rare bacteria, no leukocytes. He was started on empiric broad spectrum abx until blood and urine cx results are back.   Assessment & Plan:   Active Problems:   Sepsis due to pseudomonas bacteremia, source unclear at this time, RLE cellulitis  - Sepsis criteria met on admission with fever, tachycardia, tachypnea, leukocytosis, lactic acidosis - Blood cx on admission growing pseudomonas, final culture report pending  - vanc and zosyn stopped. I will start Cipro today as his cultures are sensitive to it.  - Repeat blood cx obtained 2/24 to ensure clearance of bacteremia, so far negative repeat cultures     RLE cellulitis/Left transmetatarsal amputation on 09/11/16 - current ABX should be adequate in coverage  -LE dopplers to rule out DVT  - he has an outpatient appt with Dr Sharol Given tomorrow, I have sent a page to him to see if he can evaluated him in the hospital.     Chronic atrial fibrillation ([place]) / Anticoagulated on Coumadin - CHADS vasc score 4 - On Anticoagulation with coumadin - Continue rate control with  metoprolol    Myasthenia gravis ([place]) - On chronic steroids    Amputated toe, left ([facility]) - Stable     Hypokalemia - Due to sepsis - Supplemented - Follow up BMP in am    Benign essential HTN - Continue metoprolol   DVT prophylaxis: on coumadin  Code Status: partial code, no intubation  Family Communication: spoke with daughter over the phone.  Disposition Plan: not yet stable for discharge, home once stable  Consultants:   Palliative care   Procedures:   None   Antimicrobials:   Vanco and zosyn 09/20/2016 changed to Cipro on 2/26  Subjective: Just reports of LE pain and redness. He was out of his bed to chair this morning.   Objective: Vitals:   09/22/16 1207 09/22/16 2126 09/23/16 0441 09/23/16 1227  BP: 97/61 102/64 115/81 100/69  Pulse: 100 (!) 108 (!) 106 (!) 125  Resp: 20 20 20 18   Temp: 98.3 F (36.8 C) 98 F (36.7 C) 98 F (36.7 C) 97.9 F (36.6 C)  TempSrc: Oral Oral Oral Oral  SpO2: 96% 92% 98% 94%  Weight:   77.6 kg (171 lb 1.6 oz)   Height:        Intake/Output Summary (Last 24 hours) at 09/23/16 1327 Last data filed at 09/23/16 1000  Gross per 24 hour  Intake             1440 ml  Output              801 ml  Net              639 ml   Filed Weights   09/21/16 0014 09/22/16 0453 09/23/16 0441  Weight: 76.8 kg (169 lb 4.8 oz) 77.8 kg (171 lb 9.6 oz) 77.6 kg (171 lb 1.6 oz)    Examination:  General exam: Disoriented, restless in am and then in the afternoon more lethargic Respiratory system: Diminished air movement at bases but no wheezing  Cardiovascular system: S1 & S2 heard, irregular rhythm, tachycardic, soft SEM appreciated  Gastrointestinal system: Abdomen is nondistended, soft and nontender. No organomegaly or masses felt. Central nervous system: o focal neurological deficits. Extremities: Symmetric 5 x 5 power. +1 LE pitting edema; Left transmetatarsal amputation, RLE calf area with erythema and edema and warmth to touch Skin: No  rashes, lesions or ulcers Psychiatry: Restless, not agitated    Data Reviewed: I have personally reviewed following labs and imaging studies  CBC:  Recent Labs Lab 09/20/16 1948 09/21/16 0426 09/22/16 0418 09/23/16 0541  WBC 19.5* 20.7* 19.6* 18.4*  NEUTROABS  --  19.2*  --   --   HGB 12.1* 11.4* 10.7* 10.8*  HCT 36.8* 35.9* 33.8* 34.6*  MCV 98.9 100.6* 101.5* 102.4*  PLT 223 187 168 720   Basic Metabolic Panel:  Recent Labs Lab 09/20/16 1948 09/20/16 2158 09/21/16 0426 09/22/16 0418 09/23/16 0541  NA 140  --  142 139 138  K 2.8*  --  3.2* 3.5 3.1*  CL 100*  --  104 103 102  CO2 28  --  28 26 30   GLUCOSE 119*  --  149* 122* 110*  BUN 15  --  13 13 13   CREATININE 0.78  --  0.80 0.78 0.79  CALCIUM 8.9  --  8.3* 8.2* 8.2*  MG  --  1.7  --   --   --    Liver Function Tests:  Recent Labs Lab 09/20/16 1948  AST 20  ALT 14*  ALKPHOS 56  BILITOT 0.9  PROT 5.8*  ALBUMIN 3.3*    Recent Labs Lab 09/20/16 1948  LIPASE 18   Coagulation Profile:  Recent Labs Lab 09/21/16 0426 09/22/16 0418 09/23/16 0541  INR 2.28 2.88 2.74   Urine analysis:    Component Value Date/Time   COLORURINE YELLOW 09/20/2016 2024   APPEARANCEUR HAZY (A) 09/20/2016 2024   LABSPEC 1.015 09/20/2016 2024   PHURINE 6.0 09/20/2016 2024   GLUCOSEU NEGATIVE 09/20/2016 2024   HGBUR MODERATE (A) 09/20/2016 2024   [place] NEGATIVE 09/20/2016 2024   KETONESUR NEGATIVE 09/20/2016 2024   PROTEINUR NEGATIVE 09/20/2016 2024   NITRITE NEGATIVE 09/20/2016 2024   LEUKOCYTESUR NEGATIVE 09/20/2016 2024   Recent Results (from the past 240 hour(s))  Blood culture (routine x 2)     Status: None (Preliminary result)   Collection Time: 09/20/16  9:45 PM  Result Value Ref Range Status   Specimen Description BLOOD RIGHT FOREARM  Final   Special Requests BOTTLES DRAWN AEROBIC AND ANAEROBIC 5ML  Final   Culture NO GROWTH 2 DAYS  Final   Report Status PENDING  Incomplete  Blood culture (routine  x 2)     Status: Abnormal   Collection Time: 09/20/16 10:05 PM  Result Value Ref Range Status   Specimen Description BLOOD RIGHT HAND  Final   Special Requests IN PEDIATRIC BOTTLE 2ML  Final   Culture  Setup Time   Final    GRAM NEGATIVE RODS IN PEDIATRIC BOTTLE CRITICAL RESULT CALLED TO, READ  BACK BY AND VERIFIED WITH: A. JOHNSTON, PHARM, 09/21/16 AT 1733 BY J FUDESCO    Culture PSEUDOMONAS AERUGINOSA (A)  Final   Report Status 09/23/2016 FINAL  Final   Organism ID, Bacteria PSEUDOMONAS AERUGINOSA  Final      Susceptibility   Pseudomonas aeruginosa - MIC*    CEFTAZIDIME 16 INTERMEDIATE Intermediate     CIPROFLOXACIN 1 SENSITIVE Sensitive     GENTAMICIN <=1 SENSITIVE Sensitive     IMIPENEM 2 SENSITIVE Sensitive     PIP/TAZO 32 SENSITIVE Sensitive     CEFEPIME INTERMEDIATE Intermediate     * PSEUDOMONAS AERUGINOSA  Blood Culture ID Panel (Reflexed)     Status: Abnormal   Collection Time: 09/20/16 10:05 PM  Result Value Ref Range Status   Enterococcus species NOT DETECTED NOT DETECTED Final   Listeria monocytogenes NOT DETECTED NOT DETECTED Final   Staphylococcus species NOT DETECTED NOT DETECTED Final   Staphylococcus aureus NOT DETECTED NOT DETECTED Final   Streptococcus species NOT DETECTED NOT DETECTED Final   Streptococcus agalactiae NOT DETECTED NOT DETECTED Final   Streptococcus pneumoniae NOT DETECTED NOT DETECTED Final   Streptococcus pyogenes NOT DETECTED NOT DETECTED Final   Acinetobacter baumannii NOT DETECTED NOT DETECTED Final   Enterobacteriaceae species NOT DETECTED NOT DETECTED Final   Enterobacter cloacae complex NOT DETECTED NOT DETECTED Final   Escherichia coli NOT DETECTED NOT DETECTED Final   Klebsiella oxytoca NOT DETECTED NOT DETECTED Final   Klebsiella pneumoniae NOT DETECTED NOT DETECTED Final   Proteus species NOT DETECTED NOT DETECTED Final   Serratia marcescens NOT DETECTED NOT DETECTED Final   Carbapenem resistance NOT DETECTED NOT DETECTED Final    Haemophilus influenzae NOT DETECTED NOT DETECTED Final   Neisseria meningitidis NOT DETECTED NOT DETECTED Final   Pseudomonas aeruginosa DETECTED (A) NOT DETECTED Final    Comment: CRITICAL RESULT CALLED TO, READ BACK BY AND VERIFIED WITH: A. JOHNSTON, PHARM, 09/21/16 AT 1733 BY J FUDESCO    Candida albicans NOT DETECTED NOT DETECTED Final   Candida glabrata NOT DETECTED NOT DETECTED Final   Candida krusei NOT DETECTED NOT DETECTED Final   Candida parapsilosis NOT DETECTED NOT DETECTED Final   Candida tropicalis NOT DETECTED NOT DETECTED Final  Urine culture     Status: Abnormal   Collection Time: 09/21/16  3:08 PM  Result Value Ref Range Status   Specimen Description URINE, RANDOM  Final   Special Requests NONE  Final   Culture <10,000 COLONIES/mL INSIGNIFICANT GROWTH (A)  Final   Report Status 09/22/2016 FINAL  Final      Radiology Studies: Dg Chest 2 View  Result Date: 09/20/2016 1. Small bilateral effusions.  No focal consolidation. 2. Stable moderate cardiomegaly without overt edema 3. Extensive calcified pleural plaques Electronically Signed   By: Donavan Foil M.D.   On: 09/20/2016 21:19    Scheduled Meds: . escitalopram  5 mg Oral QHS  . fluticasone  1 spray Each Nare BID  . metoprolol tartrate  12.5 mg Oral BID  . pantoprazole  40 mg Oral Daily  . piperacillin-tazobac  3.375 g Intravenous Q8H  . predniSONE  10 mg Oral q morning - 10a  . vancomycin  1,000 mg Intravenous Q12H  . warfarin  2.5 mg Oral ONCE-1800   Continuous Infusions:    LOS: 3 days    Time spent: 25 minutes  Greater than 50% of the time spent on counseling and coordinating the care.  Klara Stjames Arsenio Loader, MD [facility]   If  7PM-7AM, please contact night-coverage [email or web address] Password [email or web address] 09/23/2016, 1:27 PM

## 2016-09-23 NOTE — Progress Notes (Signed)
ANTICOAGULATION CONSULT NOTE - Follow Up Consult  Pharmacy Consult for warfarin Indication: atrial fibrillation  Allergies  Allergen Reactions  . Tape Other (See Comments)    PATIENT'S SKIN IS VERY THIN AND WILL TEAR AND BRUISE VERY EASILY!!    Patient Measurements: Height: 6\' 3"  (190.5 cm) Weight: 171 lb 1.6 oz (77.6 kg) (scale b) IBW/kg (Calculated) : 84.5   Vital Signs: Temp: 98 F (36.7 C) (02/26 0441) Temp Source: Oral (02/26 0441) BP: 115/81 (02/26 0441) Pulse Rate: 106 (02/26 0441)  Labs:  Recent Labs  09/21/16 0426 09/22/16 0418 09/23/16 0541  HGB 11.4* 10.7* 10.8*  HCT 35.9* 33.8* 34.6*  PLT 187 168 176  LABPROT 25.5* 30.8* 29.5*  INR 2.28 2.88 2.74  CREATININE 0.80 0.78 0.79    Estimated Creatinine Clearance: 70.1 mL/min (by C-G formula based on SCr of 0.79 mg/dL).   Assessment: 83 yo male presenting with nausea, recent TMT amputation. Pt on warfarin PTA for afib. INR therapeutic at 2.88 but with large jump. CBC stable, no bleeding noted. Pt noted to have poor PO intake. DDI: Patient started on ABX and prednisone.   PTA warfarin dose: 5 mg MWF, 2.5 mg all other days. Pt took last dose 2/23 PTA.   Goal of Therapy:  INR 2-3 Monitor platelets by anticoagulation protocol: Yes   Plan:  Warfarin 2.5mg   Daily INR, CBC Monitor PO intake, DDI, s/sx bleeding  Andrey Cota. Diona Foley, PharmD, BCPS Clinical Pharmacist [phone] 2/26/20188:52 AM

## 2016-09-24 ENCOUNTER — Inpatient Hospital Stay (HOSPITAL_COMMUNITY): Payer: Medicare Other

## 2016-09-24 DIAGNOSIS — Z89432 Acquired absence of left foot: Secondary | ICD-10-CM

## 2016-09-24 DIAGNOSIS — I87331 Chronic venous hypertension (idiopathic) with ulcer and inflammation of right lower extremity: Secondary | ICD-10-CM

## 2016-09-24 DIAGNOSIS — I517 Cardiomegaly: Secondary | ICD-10-CM

## 2016-09-24 DIAGNOSIS — M79609 Pain in unspecified limb: Secondary | ICD-10-CM

## 2016-09-24 LAB — BLOOD CULTURE ID PANEL (REFLEXED)
ACINETOBACTER BAUMANNII: NOT DETECTED
CANDIDA ALBICANS: DETECTED — AB
CANDIDA GLABRATA: NOT DETECTED
CANDIDA TROPICALIS: NOT DETECTED
Candida krusei: NOT DETECTED
Candida parapsilosis: NOT DETECTED
ENTEROBACTER CLOACAE COMPLEX: NOT DETECTED
ENTEROBACTERIACEAE SPECIES: NOT DETECTED
Enterococcus species: NOT DETECTED
Escherichia coli: NOT DETECTED
Haemophilus influenzae: NOT DETECTED
KLEBSIELLA PNEUMONIAE: NOT DETECTED
Klebsiella oxytoca: NOT DETECTED
Listeria monocytogenes: NOT DETECTED
NEISSERIA MENINGITIDIS: NOT DETECTED
PSEUDOMONAS AERUGINOSA: NOT DETECTED
Proteus species: NOT DETECTED
STREPTOCOCCUS AGALACTIAE: NOT DETECTED
STREPTOCOCCUS PNEUMONIAE: NOT DETECTED
STREPTOCOCCUS PYOGENES: NOT DETECTED
Serratia marcescens: NOT DETECTED
Staphylococcus aureus (BCID): NOT DETECTED
Staphylococcus species: NOT DETECTED
Streptococcus species: NOT DETECTED

## 2016-09-24 LAB — BASIC METABOLIC PANEL
Anion gap: 8 (ref 5–15)
BUN: 9 mg/dL (ref 6–20)
CHLORIDE: 99 mmol/L — AB (ref 101–111)
CO2: 30 mmol/L (ref 22–32)
Calcium: 8.2 mg/dL — ABNORMAL LOW (ref 8.9–10.3)
Creatinine, Ser: 0.71 mg/dL (ref 0.61–1.24)
GFR calc Af Amer: >60 mL/min (ref >60)
GFR calc non Af Amer: >60 mL/min (ref >60)
Glucose, Bld: 97 mg/dL (ref 65–99)
POTASSIUM: 2.9 mmol/L — AB (ref 3.5–5.1)
SODIUM: 137 mmol/L (ref 135–145)

## 2016-09-24 LAB — CBC
HCT: 32.6 % — ABNORMAL LOW (ref 39.0–52.0)
HEMOGLOBIN: 10.6 g/dL — AB (ref 13.0–17.0)
MCH: 32.8 pg (ref 26.0–34.0)
MCHC: 32.5 g/dL (ref 30.0–36.0)
MCV: 100.9 fL — ABNORMAL HIGH (ref 78.0–100.0)
Platelets: 182 10*3/uL (ref 150–400)
RBC: 3.23 MIL/uL — AB (ref 4.22–5.81)
RDW: 14.2 % (ref 11.5–15.5)
WBC: 12.3 10*3/uL — ABNORMAL HIGH (ref 4.0–10.5)

## 2016-09-24 LAB — ECHOCARDIOGRAM COMPLETE
Height: 75 in
WEIGHTICAEL: 2753.6 [oz_av]

## 2016-09-24 LAB — PROTIME-INR
INR: 2.48
Prothrombin Time: 27.3 seconds — ABNORMAL HIGH (ref 11.4–15.2)

## 2016-09-24 LAB — MAGNESIUM: MAGNESIUM: 1.9 mg/dL (ref 1.7–2.4)

## 2016-09-24 MED ORDER — METOPROLOL TARTRATE 25 MG PO TABS
25.0000 mg | ORAL_TABLET | Freq: Two times a day (BID) | ORAL | Status: DC
  Administered 2016-09-24 – 2016-10-02 (×16): 25 mg via ORAL
  Filled 2016-09-24 (×17): qty 1

## 2016-09-24 MED ORDER — METOPROLOL TARTRATE 5 MG/5ML IV SOLN
5.0000 mg | Freq: Once | INTRAVENOUS | Status: AC
  Administered 2016-09-24: 5 mg via INTRAVENOUS
  Filled 2016-09-24: qty 5

## 2016-09-24 MED ORDER — FLUCONAZOLE IN SODIUM CHLORIDE 400-0.9 MG/200ML-% IV SOLN
800.0000 mg | Freq: Once | INTRAVENOUS | Status: AC
  Administered 2016-09-24: 800 mg via INTRAVENOUS
  Filled 2016-09-24: qty 400

## 2016-09-24 MED ORDER — SODIUM CHLORIDE 0.9 % IV SOLN
2.0000 g | Freq: Three times a day (TID) | INTRAVENOUS | Status: DC
  Administered 2016-09-24 – 2016-09-30 (×19): 2 g via INTRAVENOUS
  Filled 2016-09-24 (×20): qty 2

## 2016-09-24 MED ORDER — SODIUM CHLORIDE 0.9 % IV SOLN
200.0000 mg | INTRAVENOUS | Status: DC
  Filled 2016-09-24: qty 200

## 2016-09-24 MED ORDER — FLUCONAZOLE IN SODIUM CHLORIDE 400-0.9 MG/200ML-% IV SOLN
400.0000 mg | INTRAVENOUS | Status: DC
  Administered 2016-09-25 – 2016-10-02 (×8): 400 mg via INTRAVENOUS
  Filled 2016-09-24 (×8): qty 200

## 2016-09-24 MED ORDER — SODIUM CHLORIDE 0.9 % IV SOLN
30.0000 meq | Freq: Three times a day (TID) | INTRAVENOUS | Status: AC
  Administered 2016-09-24 (×2): 30 meq via INTRAVENOUS
  Filled 2016-09-24 (×2): qty 15

## 2016-09-24 MED ORDER — WARFARIN SODIUM 2.5 MG PO TABS
2.5000 mg | ORAL_TABLET | Freq: Once | ORAL | Status: AC
  Administered 2016-09-24: 2.5 mg via ORAL
  Filled 2016-09-24: qty 1

## 2016-09-24 NOTE — Care Management Important Message (Signed)
Important Message  Patient Details  Name: Richard Bradley MRN: [id number] Date of Birth: [birth year]   Medicare Important Message Given:  Yes    Orbie Pyo 09/24/2016, 2:43 PM

## 2016-09-24 NOTE — Progress Notes (Signed)
   83yo M wth recent left TMT amputation admitted for sepsis. He was found to have initially pseudomonal bacteremia and most recent repeat blood cx growing yeast.  Formal ID consult to follow  1) will d/c cipro since it can cause AMS in elderly and increase risk for cdifficile 2) will start meropenem for PsA bacteremia 3) will start anidulafungin for fungemia 4) please get TTE for evaluation of endocarditis   Jandel Patriarca [facility] [phone]

## 2016-09-24 NOTE — Progress Notes (Signed)
ANTICOAGULATION CONSULT NOTE - Follow Up Consult  Pharmacy Consult for warfarin Indication: atrial fibrillation  Allergies  Allergen Reactions  . Tape Other (See Comments)    PATIENT'S SKIN IS VERY THIN AND WILL TEAR AND BRUISE VERY EASILY!!    Patient Measurements: Height: 6\' 3"  (190.5 cm) Weight: 172 lb 1.6 oz (78.1 kg) IBW/kg (Calculated) : 84.5   Vital Signs: Temp: 99 F (37.2 C) (02/27 0531) Temp Source: Oral (02/27 0531) BP: 110/67 (02/27 0531) Pulse Rate: 121 (02/27 0531)  Labs:  Recent Labs  09/22/16 0418 09/23/16 0541 09/24/16 0540  HGB 10.7* 10.8*  --   HCT 33.8* 34.6*  --   PLT 168 176  --   LABPROT 30.8* 29.5* 27.3*  INR 2.88 2.74 2.48  CREATININE 0.78 0.79  --     Estimated Creatinine Clearance: 70.5 mL/min (by C-G formula based on SCr of 0.79 mg/dL).   Assessment: 83 yo male presenting with nausea, recent TMT amputation. Pt on warfarin PTA for afib. INR therapeutic at 2.88 but with large jump. CBC stable, no bleeding noted. Pt noted to have poor PO intake. DDI: Patient started on ABX and prednisone.   PTA warfarin dose: 5 mg MWF, 2.5 mg all other days. Pt took last dose 2/23 PTA.  Will start back home dose today.   Goal of Therapy:  INR 2-3 Monitor platelets by anticoagulation protocol: Yes   Plan:  Warfarin 2.5mg   Daily INR, CBC Monitor PO intake, DDI, s/sx bleeding  Andrey Cota. Diona Foley, PharmD, BCPS Clinical Pharmacist [phone] 2/27/20188:30 AM

## 2016-09-24 NOTE — Progress Notes (Signed)
   PHARMACY - PHYSICIAN COMMUNICATION CRITICAL VALUE ALERT - BLOOD CULTURE IDENTIFICATION (BCID)  Results for orders placed or performed during the hospital encounter of 09/20/16  Blood Culture ID Panel (Reflexed) (Collected: 09/20/2016 10:05 PM)  Result Value Ref Range   Enterococcus species NOT DETECTED NOT DETECTED   Listeria monocytogenes NOT DETECTED NOT DETECTED   Staphylococcus species NOT DETECTED NOT DETECTED   Staphylococcus aureus NOT DETECTED NOT DETECTED   Streptococcus species NOT DETECTED NOT DETECTED   Streptococcus agalactiae NOT DETECTED NOT DETECTED   Streptococcus pneumoniae NOT DETECTED NOT DETECTED   Streptococcus pyogenes NOT DETECTED NOT DETECTED   Acinetobacter baumannii NOT DETECTED NOT DETECTED   Enterobacteriaceae species NOT DETECTED NOT DETECTED   Enterobacter cloacae complex NOT DETECTED NOT DETECTED   Escherichia coli NOT DETECTED NOT DETECTED   Klebsiella oxytoca NOT DETECTED NOT DETECTED   Klebsiella pneumoniae NOT DETECTED NOT DETECTED   Proteus species NOT DETECTED NOT DETECTED   Serratia marcescens NOT DETECTED NOT DETECTED   Carbapenem resistance NOT DETECTED NOT DETECTED   Haemophilus influenzae NOT DETECTED NOT DETECTED   Neisseria meningitidis NOT DETECTED NOT DETECTED   Pseudomonas aeruginosa DETECTED (A) NOT DETECTED   Candida albicans NOT DETECTED NOT DETECTED   Candida glabrata NOT DETECTED NOT DETECTED   Candida krusei NOT DETECTED NOT DETECTED   Candida parapsilosis NOT DETECTED NOT DETECTED   Candida tropicalis NOT DETECTED NOT DETECTED    Name of physician (or Provider) ContactedRetia Passe  Actively being treated for pseudomonas bacteremia. Repeat cx's now growing 1/2 candida albicans detected by BCID. Does have a hx of cancer and myasthenia gravis so would consider immunocompromised.  Changes to prescribed antibiotics required: Add fluconazole for candida albicans   Elenor Quinones, PharmD, BCPS Clinical Pharmacist Pager  [phone] 09/24/2016 8:20 AM

## 2016-09-24 NOTE — Progress Notes (Signed)
Pharmacy Antibiotic Note  Richard Bradley is a 83 y.o. male admitted on 09/20/2016 with pseudomonas bacteremia.  Pharmacy has been consulted for merrem dosing.  Plan: Merrem 2g IV q8h Monitor culture data, renal function and clinical course  Height: 6\' 3"  (190.5 cm) Weight: 172 lb 1.6 oz (78.1 kg) IBW/kg (Calculated) : 84.5  Temp (24hrs), Avg:98.1 F (36.7 C), Min:97.4 F (36.3 C), Max:99 F (37.2 C)   Recent Labs Lab 09/20/16 1948 09/20/16 2202 09/21/16 0426 09/22/16 0418 09/22/16 1027 09/23/16 0541  WBC 19.5*  --  20.7* 19.6*  --  18.4*  CREATININE 0.78  --  0.80 0.78  --  0.79  LATICACIDVEN  --  1.98*  --   --   --   --   VANCOTROUGH  --   --   --   --  12*  --     Estimated Creatinine Clearance: 70.5 mL/min (by C-G formula based on SCr of 0.79 mg/dL).    Allergies  Allergen Reactions  . Tape Other (See Comments)    PATIENT'S SKIN IS VERY THIN AND WILL TEAR AND BRUISE VERY EASILY!!    Antimicrobials this admission: Zosyn 2/23>>2/26 Vanc  2/23>>2/26  Cipro 2/26>>2/27 Merrem 2/27>>  Microbiology results: 2/23 BCx x2: pseudomonas 2/24 UCx: insig growth 2/25 BCx2: candida  Andrey Cota. Diona Foley, PharmD, BCPS Clinical Pharmacist [phone] 09/24/2016 8:26 AM

## 2016-09-24 NOTE — Progress Notes (Signed)
Patient ID: Richard Bradley, male   DOB: 1926/01/19, 83 y.o.   MRN: [id number]  PROGRESS NOTE  Richard Bradley  JTT:[id number] DOB: 1926-02-08 DOA: 09/20/2016  PCP: [name], MD  Brief Narrative:  83 y.o. male with medical history significant for chronic atrial fibrillation on AC with coumadin, myasthenia gravis, prostate cancer, basal cell CA, recently underwent a left TMT amputation and is coming to the emergency department with nausea, dry heaves and chills since the morning of the admission.   On admission. BP was 90/48 and has improved with IV fluids to 159/109. HR was 121, RR 18-33, T max was 101.73F, oxygen saturation was 86% on room air but has improved to 95% with [place] oxygen support. Blood work was notable for WBC count 19.5, hemoglobin 12.1, potassium 2.8 which was supplemented. Lactic acid was 1.98. CXR showed small bilateral effusions but no focal consolidation. UA showed rare bacteria, no leukocytes. He was started on empiric broad spectrum abx until blood and urine cx results are back.   Assessment & Plan:   Active Problems:   Sepsis due to pseudomonas bacteremia and Candida Albicans now, source unclear at this time, RLE cellulitis  - Sepsis criteria met on admission with fever, tachycardia, tachypnea, leukocytosis, lactic acidosis - Blood cx on admission growing pseudomonas, final culture report pending  - vanc and zosyn stopped. Switched to Meropenem per ID recs  - Repeat blood cx obtained 2/24 to ensure clearance of bacteremia, so far negative repeat cultures but it has growing Candida Albicans now- therefore Antifungal started. ID following now  -will order 2d echo to rule out endocarditis.     RLE cellulitis/Left transmetatarsal amputation on 09/11/16 - current ABX should be adequate in coverage  -LE dopplers to rule out DVT - pending at this time.  - seen by Dr Sharol Given- he can follow up in 2 weeks in his clinic.     Chronic atrial fibrillation (HCC) / Anticoagulated on  Coumadin - CHADS vasc score 4 - On Anticoagulation with coumadin - Continue rate control with metoprolol    Myasthenia gravis ([place]) - On chronic steroids    Amputated toe, left (HCC) - Stable     Hypokalemia - Due to sepsis - Supplement as needed  - Follow up BMP in am    Benign essential HTN - Continue metoprolol   DVT prophylaxis: on coumadin  Code Status: partial code, no intubation  Family Communication: spoke with daughter over the phone.  Disposition Plan: To be determined at thsi time. He is now growing fungus in his blood stream. Cont inpatient stay.   Consultants:   Palliative care   Procedures:   None   Antimicrobials:   Vanco and zosyn 09/20/2016   Now on Meropenem and DiFlucan  Subjective: Patient states he feels much better and doesn't have any complaints this morning.   Objective: Vitals:   09/23/16 1227 09/23/16 2017 09/24/16 0531 09/24/16 1124  BP: 100/69 112/68 110/67 (!) 121/93  Pulse: (!) 125 (!) 121 (!) 121 (!) 128  Resp: 18 18 18    Temp: 97.9 F (36.6 C) 97.4 F (36.3 C) 99 F (37.2 C)   TempSrc: Oral Oral Oral   SpO2: 94% 94% 94%   Weight:   78.1 kg (172 lb 1.6 oz)   Height:        Intake/Output Summary (Last 24 hours) at 09/24/16 1154 Last data filed at 09/24/16 0600  Gross per 24 hour  Intake  723 ml  Output             1000 ml  Net             -277 ml   Filed Weights   09/22/16 0453 09/23/16 0441 09/24/16 0531  Weight: 77.8 kg (171 lb 9.6 oz) 77.6 kg (171 lb 1.6 oz) 78.1 kg (172 lb 1.6 oz)    Examination:  General exam: Disoriented, restless in am and then in the afternoon more lethargic Respiratory system: Diminished air movement at bases but no wheezing  Cardiovascular system: S1 & S2 heard, irregular rhythm, tachycardic, soft SEM appreciated  Gastrointestinal system: Abdomen is nondistended, soft and nontender. No organomegaly or masses felt. Central nervous system: o focal neurological  deficits. Extremities: Symmetric 5 x 5 power. +1 LE pitting edema; Left transmetatarsal amputation, RLE calf area with erythema and edema and warmth to touch Skin: No rashes, lesions or ulcers Psychiatry: Restless, not agitated    Data Reviewed: I have personally reviewed following labs and imaging studies  CBC:  Recent Labs Lab 09/20/16 1948 09/21/16 0426 09/22/16 0418 09/23/16 0541 09/24/16 0823  WBC 19.5* 20.7* 19.6* 18.4* 12.3*  NEUTROABS  --  19.2*  --   --   --   HGB 12.1* 11.4* 10.7* 10.8* 10.6*  HCT 36.8* 35.9* 33.8* 34.6* 32.6*  MCV 98.9 100.6* 101.5* 102.4* 100.9*  PLT 223 187 168 176 865   Basic Metabolic Panel:  Recent Labs Lab 09/20/16 1948 09/20/16 2158 09/21/16 0426 09/22/16 0418 09/23/16 0541 09/24/16 0823  NA 140  --  142 139 138 137  K 2.8*  --  3.2* 3.5 3.1* 2.9*  CL 100*  --  104 103 102 99*  CO2 28  --  28 26 30 30   GLUCOSE 119*  --  149* 122* 110* 97  BUN 15  --  13 13 13 9   CREATININE 0.78  --  0.80 0.78 0.79 0.71  CALCIUM 8.9  --  8.3* 8.2* 8.2* 8.2*  MG  --  1.7  --   --   --  1.9   Liver Function Tests:  Recent Labs Lab 09/20/16 1948  AST 20  ALT 14*  ALKPHOS 56  BILITOT 0.9  PROT 5.8*  ALBUMIN 3.3*    Recent Labs Lab 09/20/16 1948  LIPASE 18   Coagulation Profile:  Recent Labs Lab 09/21/16 0426 09/22/16 0418 09/23/16 0541 09/24/16 0540  INR 2.28 2.88 2.74 2.48   Urine analysis:    Component Value Date/Time   COLORURINE YELLOW 09/20/2016 2024   APPEARANCEUR HAZY (A) 09/20/2016 2024   LABSPEC 1.015 09/20/2016 2024   PHURINE 6.0 09/20/2016 2024   GLUCOSEU NEGATIVE 09/20/2016 2024   HGBUR MODERATE (A) 09/20/2016 2024   [place] NEGATIVE 09/20/2016 2024   KETONESUR NEGATIVE 09/20/2016 2024   PROTEINUR NEGATIVE 09/20/2016 2024   NITRITE NEGATIVE 09/20/2016 2024   LEUKOCYTESUR NEGATIVE 09/20/2016 2024   Recent Results (from the past 240 hour(s))  Blood culture (routine x 2)     Status: None (Preliminary  result)   Collection Time: 09/20/16  9:45 PM  Result Value Ref Range Status   Specimen Description BLOOD RIGHT FOREARM  Final   Special Requests BOTTLES DRAWN AEROBIC AND ANAEROBIC 5ML  Final   Culture NO GROWTH 4 DAYS  Final   Report Status PENDING  Incomplete  Blood culture (routine x 2)     Status: Abnormal   Collection Time: 09/20/16 10:05 PM  Result Value Ref Range Status  Specimen Description BLOOD RIGHT HAND  Final   Special Requests IN PEDIATRIC BOTTLE 2ML  Final   Culture  Setup Time   Final    GRAM NEGATIVE RODS IN PEDIATRIC BOTTLE CRITICAL RESULT CALLED TO, READ BACK BY AND VERIFIED WITH: A. JOHNSTON, PHARM, 09/21/16 AT 1733 BY J FUDESCO    Culture PSEUDOMONAS AERUGINOSA (A)  Final   Report Status 09/23/2016 FINAL  Final   Organism ID, Bacteria PSEUDOMONAS AERUGINOSA  Final      Susceptibility   Pseudomonas aeruginosa - MIC*    CEFTAZIDIME 16 INTERMEDIATE Intermediate     CIPROFLOXACIN 1 SENSITIVE Sensitive     GENTAMICIN <=1 SENSITIVE Sensitive     IMIPENEM 2 SENSITIVE Sensitive     PIP/TAZO 32 SENSITIVE Sensitive     CEFEPIME INTERMEDIATE Intermediate     * PSEUDOMONAS AERUGINOSA  Blood Culture ID Panel (Reflexed)     Status: Abnormal   Collection Time: 09/20/16 10:05 PM  Result Value Ref Range Status   Enterococcus species NOT DETECTED NOT DETECTED Final   Listeria monocytogenes NOT DETECTED NOT DETECTED Final   Staphylococcus species NOT DETECTED NOT DETECTED Final   Staphylococcus aureus NOT DETECTED NOT DETECTED Final   Streptococcus species NOT DETECTED NOT DETECTED Final   Streptococcus agalactiae NOT DETECTED NOT DETECTED Final   Streptococcus pneumoniae NOT DETECTED NOT DETECTED Final   Streptococcus pyogenes NOT DETECTED NOT DETECTED Final   Acinetobacter baumannii NOT DETECTED NOT DETECTED Final   Enterobacteriaceae species NOT DETECTED NOT DETECTED Final   Enterobacter cloacae complex NOT DETECTED NOT DETECTED Final   Escherichia coli NOT DETECTED  NOT DETECTED Final   Klebsiella oxytoca NOT DETECTED NOT DETECTED Final   Klebsiella pneumoniae NOT DETECTED NOT DETECTED Final   Proteus species NOT DETECTED NOT DETECTED Final   Serratia marcescens NOT DETECTED NOT DETECTED Final   Carbapenem resistance NOT DETECTED NOT DETECTED Final   Haemophilus influenzae NOT DETECTED NOT DETECTED Final   Neisseria meningitidis NOT DETECTED NOT DETECTED Final   Pseudomonas aeruginosa DETECTED (A) NOT DETECTED Final    Comment: CRITICAL RESULT CALLED TO, READ BACK BY AND VERIFIED WITH: A. JOHNSTON, PHARM, 09/21/16 AT 1733 BY J FUDESCO    Candida albicans NOT DETECTED NOT DETECTED Final   Candida glabrata NOT DETECTED NOT DETECTED Final   Candida krusei NOT DETECTED NOT DETECTED Final   Candida parapsilosis NOT DETECTED NOT DETECTED Final   Candida tropicalis NOT DETECTED NOT DETECTED Final  Urine culture     Status: Abnormal   Collection Time: 09/21/16  3:08 PM  Result Value Ref Range Status   Specimen Description URINE, RANDOM  Final   Special Requests NONE  Final   Culture <10,000 COLONIES/mL INSIGNIFICANT GROWTH (A)  Final   Report Status 09/22/2016 FINAL  Final  Culture, blood (routine x 2)     Status: None (Preliminary result)   Collection Time: 09/22/16  4:28 AM  Result Value Ref Range Status   Specimen Description BLOOD RIGHT HAND  Final   Special Requests BOTTLES DRAWN AEROBIC ONLY 5CC  Final   Culture NO GROWTH 2 DAYS  Final   Report Status PENDING  Incomplete  Culture, blood (routine x 2)     Status: Abnormal (Preliminary result)   Collection Time: 09/22/16  4:34 AM  Result Value Ref Range Status   Specimen Description BLOOD LEFT HAND  Final   Special Requests BOTTLES DRAWN AEROBIC ONLY 5CC  Final   Culture  Setup Time   Final  YEAST AEROBIC BOTTLE ONLY CRITICAL RESULT CALLED TO, READ BACK BY AND VERIFIED WITH: PHARMD Karlene Einstein [id number] MLM    Culture CANDIDA ALBICANS (A)  Final   Report Status PENDING  Incomplete       Radiology Studies: Dg Chest 2 View  Result Date: 09/20/2016 1. Small bilateral effusions.  No focal consolidation. 2. Stable moderate cardiomegaly without overt edema 3. Extensive calcified pleural plaques Electronically Signed   By: Donavan Foil M.D.   On: 09/20/2016 21:19    Scheduled Meds: . escitalopram  5 mg Oral QHS  . fluticasone  1 spray Each Nare BID  . metoprolol tartrate  12.5 mg Oral BID  . pantoprazole  40 mg Oral Daily  . piperacillin-tazobac  3.375 g Intravenous Q8H  . predniSONE  10 mg Oral q morning - 10a  . vancomycin  1,000 mg Intravenous Q12H  . warfarin  2.5 mg Oral ONCE-1800   Continuous Infusions:    LOS: 4 days    Time spent: 25 minutes  Greater than 50% of the time spent on counseling and coordinating the care.  Vertie Dibbern Arsenio Loader, MD [facility]   If 7PM-7AM, please contact night-coverage [email or web address] Password [facility] 09/24/2016, 11:54 AM

## 2016-09-24 NOTE — Progress Notes (Signed)
**  Preliminary report by tech**  Bilateral lower extremity venous duplex complete. There is no evidence of deep or superficial vein thrombosis involving the right and left lower extremities. All visualized vessels appear patent and compressible. There is no evidence of Baker's cysts bilaterally.  09/24/16 3:18 PM Carlos Levering RVT

## 2016-09-24 NOTE — Consult Note (Signed)
[facility] for Infectious Disease  Total days of antibiotics 5        Day 1 cipro               Reason for Consult: candidemia   Referring Physician: amin  Active Problems:   Chronic atrial fibrillation ([place])   Myasthenia gravis (HCC)   Amputated toe, left (HCC)   Anticoagulated on Coumadin   Sepsis due to Gram negative bacteria (HCC)   Gram-negative bacteremia   Leukocytosis   Hypokalemia   Benign essential HTN   Goals of care, counseling/discussion   Palliative care by specialist    HPI: Richard Bradley is a 83 y.o. male with hx of MG on chronic steroids of 75m pred daily, chronic afib on coumadin, DM c/b diabetic neuropathy, PVD who had poorly healing left foot amputation of 1-3rd toe c/b PsA wound infection then went to have a TM amputation on 2/14 and discharged home without antibiotics and daily wound care. He was then admitted on 2/23 for acute onset of feeling poorly with n/v/chills, and fevers of 101.38F and mild hypotension. He was found to have unrevealing physical exam. Wbc at 19K, influenza negative, ua negative, cxr not suggestive of pneumonia. He was started on vanco and piptazo empirically. 1 Of 2 blood cx from admit grew PsA, then had repeat cx drawn on 2/25 to document clearance which then identified 1 of 2 sets with candida albicans. He has been on anti-pseudomonal therapy which he has responded to, no longer febrile and leukocytosis improving. Source of bacteremia somewhat unclear since his amputation site appears to be healing though the areas are still slow to heal, only serous drainage on bandage. No indwelling lines.   Past Medical History:  Diagnosis Date  . Atrial fibrillation ([place]   . Cellulitis    left arm  . CH[place] ([place]estive heart failure) ([place]   . Constipation   . Dysrhythmia    hx of atrial fibrilation  . GERD (gastroesophageal reflux disease)    ocassional  . History of kidney stones   . Myasthenia gravis   . Osteoarthritis   . Pneumonia     Hstory of  . 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 Prostate cancer ([place] dx[place] 1982   treated with surgery and radiation.  Basal cell cancer  . Sepsis ([place]   . Shortness of breath dyspnea    with exertion  . Urine incontinence   . Varicose veins of left lower extremity     Allergies:  Allergies  Allergen Reactions  . Tape Other (See Comments)    PATIENT'S SKIN IS VERY THIN AND WILL TEAR AND BRUISE VERY EASILY!!    MEDICATIONS: . escitalopram  5 mg Oral QHS  . [START ON 09/25/2016] fluconazole (DIFLUCAN) IV  400 mg Intravenous Q24H  . fluticasone  1 spray Each Nare BID  . meropenem (MERREM) IV  2 g Intravenous Q8H  . metoprolol tartrate  25 mg Oral BID  . pantoprazole  40 mg Oral Daily  . polycarbophil  625 mg Oral TID  . polyethylene glycol  17 g Oral QHS  . potassium chloride (KCL MULTIRUN) 30 mEq in 265 mL IVPB  30 mEq Intravenous Q8H  . predniSONE  10 mg Oral q morning - 10a  . warfarin  2.5 mg Oral ONCE-1800  . Warfarin - Pharmacist Dosing Inpatient   Does not apply q1800    Social History  Substance Use Topics  . Smoking status: Never Smoker  .  Smokeless tobacco: Never Used  . Alcohol use 0.0 oz/week     Comment: occasional beer    Family History  Problem Relation Age of Onset  . Heart disease Father   . Heart attack Father   . Hypertension Father   . Heart disease Brother   . Heart disease Paternal Grandfather   . Stroke Mother   . Parkinsonism Sister   . Heart disease Sister   . Dementia Sister      Review of Systems  Constitutional: +confusion. Negative for fever, chills, diaphoresis, activity change, appetite change, fatigue and unexpected weight change.  HENT: Negative for congestion, sore throat, rhinorrhea, sneezing, trouble swallowing and sinus pressure.  Eyes: Negative for photophobia and visual disturbance.  Respiratory: Negative for cough, chest tightness, shortness of breath, wheezing and stridor.  Cardiovascular:  Negative for chest pain, palpitations and leg swelling.  Gastrointestinal: Negative for nausea, vomiting, abdominal pain, diarrhea, constipation, blood in stool, abdominal distention and anal bleeding.  Genitourinary: Negative for dysuria, hematuria, flank pain and difficulty urinating.  Musculoskeletal: Negative for myalgias, back pain, joint swelling, arthralgias and gait problem.  Skin: Negative for color change, pallor, rash and wound.  Neurological: Negative for dizziness, tremors, weakness and light-headedness.  Hematological: Negative for adenopathy. Does not bruise/bleed easily.  Psychiatric/Behavioral: Negative for behavioral problems, confusion, sleep disturbance, dysphoric mood, decreased concentration and agitation.     OBJECTIVE: Temp:  [97.4 F (36.3 C)-99 F (37.2 C)] 99 F (37.2 C) (02/27 0531) Pulse Rate:  [120-128] 120 (02/27 1331) Resp:  [18] 18 (02/27 0531) BP: (110-121)/(67-93) 110/83 (02/27 1331) SpO2:  [94 %] 94 % (02/27 0531) Weight:  [172 lb 1.6 oz (78.1 kg)] 172 lb 1.6 oz (78.1 kg) (02/27 0531) Physical Exam  Constitutional:  oriented to person, place, and time. appears well-developed and well-nourished. No distress.  HENT: Bath/AT, PERRLA, no scleral icterus Mouth/Throat: Oropharynx is clear and moist. No oropharyngeal exudate.  Cardiovascular: irreg, irreg,  and normal heart sounds. Exam reveals no gallop and no friction rub.  No murmur heard.  Pulmonary/Chest: Effort normal and breath sounds normal. No respiratory distress.  has no wheezes.  Neck = supple, no nuchal rigidity Abdominal: Soft. Bowel sounds are normal.  exhibits no distension. There is no tenderness.  Lymphadenopathy: no cervical adenopathy. No axillary adenopathy Neurological: alert and oriented to person, place, and time.  Skin: Skin is frail, scattered echymosis.  Ext: left TMA sutures in place, no warmth. No expressible pus. Some areas still slow to approximate edges Psychiatric: a normal  mood and affect.  behavior is normal.    LABS: Results for orders placed or performed during the hospital encounter of 09/20/16 (from the past 48 hour(s))  Basic metabolic panel     Status: Abnormal   Collection Time: 09/23/16  5:41 AM  Result Value Ref Range   Sodium 138 135 - 145 mmol/L   Potassium 3.1 (L) 3.5 - 5.1 mmol/L   Chloride 102 101 - 111 mmol/L   CO2 30 22 - 32 mmol/L   Glucose, Bld 110 (H) 65 - 99 mg/dL   BUN 13 6 - 20 mg/dL   Creatinine, Ser 0.79 0.61 - 1.24 mg/dL   Calcium 8.2 (L) 8.9 - 10.3 mg/dL   GFR calc non Af Amer >60 >60 mL/min   GFR calc Af Amer >60 >60 mL/min    Comment: (NOTE) The eGFR has been calculated using the CKD EPI equation. This calculation has not been validated in all clinical situations. eGFR's persistently <60  mL/min signify possible Chronic Kidney Disease.    Anion gap 6 5 - 15  Protime-INR     Status: Abnormal   Collection Time: 09/23/16  5:41 AM  Result Value Ref Range   Prothrombin Time 29.5 (H) 11.4 - 15.2 seconds   INR 2.74   CBC     Status: Abnormal   Collection Time: 09/23/16  5:41 AM  Result Value Ref Range   WBC 18.4 (H) 4.0 - 10.5 K/uL   RBC 3.38 (L) 4.22 - 5.81 MIL/uL   Hemoglobin 10.8 (L) 13.0 - 17.0 g/dL   HCT 34.6 (L) 39.0 - 52.0 %   MCV 102.4 (H) 78.0 - 100.0 fL   MCH 32.0 26.0 - 34.0 pg   MCHC 31.2 30.0 - 36.0 g/dL   RDW 14.6 11.5 - 15.5 %   Platelets 176 150 - 400 K/uL  Protime-INR     Status: Abnormal   Collection Time: 09/24/16  5:40 AM  Result Value Ref Range   Prothrombin Time 27.3 (H) 11.4 - 15.2 seconds   INR 2.48   CBC     Status: Abnormal   Collection Time: 09/24/16  8:23 AM  Result Value Ref Range   WBC 12.3 (H) 4.0 - 10.5 K/uL   RBC 3.23 (L) 4.22 - 5.81 MIL/uL   Hemoglobin 10.6 (L) 13.0 - 17.0 g/dL   HCT 32.6 (L) 39.0 - 52.0 %   MCV 100.9 (H) 78.0 - 100.0 fL   MCH 32.8 26.0 - 34.0 pg   MCHC 32.5 30.0 - 36.0 g/dL   RDW 14.2 11.5 - 15.5 %   Platelets 182 150 - 400 K/uL  Basic metabolic panel      Status: Abnormal   Collection Time: 09/24/16  8:23 AM  Result Value Ref Range   Sodium 137 135 - 145 mmol/L   Potassium 2.9 (L) 3.5 - 5.1 mmol/L   Chloride 99 (L) 101 - 111 mmol/L   CO2 30 22 - 32 mmol/L   Glucose, Bld 97 65 - 99 mg/dL   BUN 9 6 - 20 mg/dL   Creatinine, Ser 0.71 0.61 - 1.24 mg/dL   Calcium 8.2 (L) 8.9 - 10.3 mg/dL   GFR calc non Af Amer >60 >60 mL/min   GFR calc Af Amer >60 >60 mL/min    Comment: (NOTE) The eGFR has been calculated using the CKD EPI equation. This calculation has not been validated in all clinical situations. eGFR's persistently <60 mL/min signify possible Chronic Kidney Disease.    Anion gap 8 5 - 15  Magnesium     Status: None   Collection Time: 09/24/16  8:23 AM  Result Value Ref Range   Magnesium 1.9 1.7 - 2.4 mg/dL    MICRO: 2/27 blood cx pending 2/25 blood cx candida albicans in 1 of 2 sets 2/23 blood cx PsA piptazo 32, imi  2, cipro 1, and gent <1, cefepime I, ceftaz I  IMAGING: TTE- Left ventricle: The cavity size was severely dilated. Wall   thickness was normal. Systolic function was moderately reduced.   The estimated ejection fraction was in the range of 35% to 40%.   Diffuse hypokinesis. The study is not technically sufficient to   allow evaluation of LV diastolic function. - Aortic valve: Sclerosis without stenosis. There was moderate   regurgitation. - Mitral valve: Mildly thickened leaflets . There was severe   regurgitation directed posteriorly. - Left atrium: Massively dilated (115 ml/m2). - Right ventricle: The cavity size was mildly  dilated. Reduced RV   systolic function. Lateral annulus peak S velocity: 7.62 cm/s. - Right atrium: Massively dilated (90 ml/m2). - Tricuspid valve: There was moderate regurgitation. - Pulmonary arteries: PA peak pressure: 52 mm Hg (S). - Inferior vena cava: The vessel was dilated. The respirophasic   diameter changes were blunted (< 50%), consistent with elevated   central venous  pressure. - Pericardium, extracardiac: A trivial pericardial effusion was   identified posterior to the heart. There was a left pleural   effusion.  Recommendations:  LVEF 35-40%, severely dilated LV with global hypokinesis and normal wall thickness, moderate AI, severe posteriorly directed MR, massive biatrial enlargement, mild RVE with reduced RV systolic function, moderate TR, RVSP 52 mmHg, dilated IVC, trivial pericardial effusion with left pleural effusion. Findings consistent with dilated cardiomyopathy.  Assessment/Plan:  89yoM with polymicrobial bacteremia ( PsA and then Candida) in span of 48hr after admit for SIRS, possible sources of bacteremia include his recent diabetic foot  S/p amputation on 2/14. He has history previously has PsA isolated in his wound before he had his amputation in mid February. Urinary source ruled out. No indwelling lines to be considered nidus of infection.   PsA bacteremia = treat for 14 days using 2/24 as day 1. Would avoid using FQ in the elderly, too high risk for cdiff as well as AMS  Candidemia = would recommend fluconazole, though we will need to adjust his coumadin dosing since can impact his INR. Await repeat culture to see if persistent candidemia. See if he would be a candidate for TEE.

## 2016-09-24 NOTE — Progress Notes (Signed)
Dressing changed on left foot. Patient tolerated well.

## 2016-09-24 NOTE — Progress Notes (Signed)
  Echocardiogram 2D Echocardiogram has been performed.  Darlina Sicilian M 09/24/2016, 2:53 PM

## 2016-09-24 NOTE — Progress Notes (Signed)
Notified Dr. Reesa Chew pt had received metoprolol 12.5 this am and if ok to give metoprolol 5mg  iv dose.  MD requesting to know what the heart rate is 115 to 118 fluctuate on the tele.  MD notified and instructed ok to give as ordered.  Karie Kirks, Therapist, sports.

## 2016-09-24 NOTE — Consult Note (Signed)
ORTHOPAEDIC CONSULTATION  REQUESTING PHYSICIAN: Ankit Arsenio Loader, MD  Chief Complaint: Cellulitis right lower extremity with left transmetatarsal amputation  HPI: Richard Bradley is a 83 y.o. male who presents with acute cellulitis of the right lower extremity with sepsis 2 weeks status post left transmetatarsal amputation.  Past Medical History:  Diagnosis Date  . Atrial fibrillation ([place])   . Cellulitis    left arm  . CHF (congestive heart failure) ([place])   . Constipation   . Dysrhythmia    hx of atrial fibrilation  . GERD (gastroesophageal reflux disease)    ocassional  . History of kidney stones   . Myasthenia gravis   . Osteoarthritis   . Pneumonia    Hstory of  . 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 Prostate cancer ([place]) dx'd 1982   treated with surgery and radiation.  Basal cell cancer  . Sepsis ([place])   . Shortness of breath dyspnea    with exertion  . Urine incontinence   . Varicose veins of left lower extremity    Past Surgical History:  Procedure Laterality Date  . AMPUTATION Left 03/20/2016   Procedure: Left Great Toe Amputation at Metatarsophalangeal Joint;  Surgeon: Newt Minion, MD;  Location: [facility];  Service: Orthopedics;  Laterality: Left;  . AMPUTATION Left 06/11/2016   Procedure: Left 2nd Toe Amputation;  Surgeon: Newt Minion, MD;  Location: [facility];  Service: Orthopedics;  Laterality: Left;  . AMPUTATION Left 07/12/2016   Procedure: LEFT 3rd TOE AMPUTATION;  Surgeon: Newt Minion, MD;  Location: [facility];  Service: Orthopedics;  Laterality: Left;  . AMPUTATION Left 09/11/2016   Procedure: Left Transmetatarsal Amputation;  Surgeon: Newt Minion, MD;  Location: [facility];  Service: Orthopedics;  Laterality: Left;  . appendectomy    . APPENDECTOMY    . COLONOSCOPY    . HERNIA REPAIR Bilateral    Inguinal  . I&D EXTREMITY Left 10/12/2014   Procedure: IRRIGATION AND DEBRIDEMENT INDEX FINGER;  Surgeon: Iran Planas, MD;  Location: [facility];   Service: Orthopedics;  Laterality: Left;  . KYPHOPLASTY  2012 ?   T 12- L1  . LAMINECTOMY  2005 ?   lumbar- 4-5  . PROSTATE SURGERY    . ROTATOR CUFF REPAIR Right   . TOTAL KNEE ARTHROPLASTY Right    Social History   Social History  . Marital status: Widowed    Spouse name: N/A  . Number of children: 2  . Years of education: 12   Occupational History  . Retired [employer]   Social History Main Topics  . Smoking status: Never Smoker  . Smokeless tobacco: Never Used  . Alcohol use 0.0 oz/week     Comment: occasional beer  . Drug use: No  . Sexual activity: Not Asked     Comment: Widowed   Other Topics Concern  . None   Social History Narrative   From [place], moving to [place]   Widowed   Right-handed   Caffeine: 1 cup of coffee per day   Family History  Problem Relation Age of Onset  . Heart disease Father   . Heart attack Father   . Hypertension Father   . Heart disease Brother   . Heart disease Paternal Grandfather   . Stroke Mother   . Parkinsonism Sister   . Heart disease Sister   . Dementia Sister    - negative except otherwise stated in the family  history section Allergies  Allergen Reactions  . Tape Other (See Comments)    PATIENT'S SKIN IS VERY THIN AND WILL TEAR AND BRUISE VERY EASILY!!   Prior to Admission medications   Medication Sig Start Date End Date Taking? Authorizing Provider  Cholecalciferol (VITAMIN D-3 PO) Take 2,000 Units by mouth daily.    Yes Historical Provider, MD  escitalopram (LEXAPRO) 5 MG tablet Take 5 mg by mouth at bedtime. 09/15/16  Yes Historical Provider, MD  FIBER PO Take 1 tablet by mouth 3 (three) times daily.    Yes Historical Provider, MD  fluticasone (FLONASE) 50 MCG/ACT nasal spray Place 1 spray into both nostrils 2 (two) times daily.    Yes Historical Provider, MD  furosemide (LASIX) 20 MG tablet Take 40 mg once daily for 5 days. Then resume 20 mg twice daily. Patient taking  differently: Take 20 mg by mouth 2 (two) times daily.  07/01/16  Yes Sedalia Muta, FNP  guaiFENesin (MUCINEX) 600 MG 12 hr tablet Take 600 mg by mouth 2 (two) times daily as needed for to loosen phlegm.   Yes Historical Provider, MD  HYDROcodone-acetaminophen (NORCO) 10-325 MG per tablet Take 1 tablet by mouth every 6 (six) hours as needed (for pain).    Yes Historical Provider, MD  ipratropium (ATROVENT) 0.03 % nasal spray Place 2 sprays into both nostrils 3 (three) times daily as needed (for congestion).   Yes Historical Provider, MD  metoprolol tartrate (LOPRESSOR) 25 MG tablet Take 12.5 mg by mouth 2 (two) times daily.    Yes Historical Provider, MD  predniSONE (DELTASONE) 10 MG tablet Take 2 tablets (20 mg total) by mouth daily. Patient taking differently: Take 20 mg by mouth every morning.  04/19/16  Yes Kathrynn Ducking, MD  vitamin B-12 (CYANOCOBALAMIN) 1000 MCG tablet Take 1,000 mcg by mouth daily.   Yes Historical Provider, MD  warfarin (COUMADIN) 5 MG tablet Take as directed by coumadin clinic Patient taking differently: Take 2.5-5 mg by mouth See admin instructions. 2.5 mg at lunch(time) on Sun/Tues/Thurs/Sat and 5 mg on Mon/Wed/Fri 07/11/16  Yes Belva Crome, MD  HYDROcodone-acetaminophen [facility]) 5-325 MG tablet Take 1 tablet by mouth every 6 (six) hours as needed. Patient not taking: Reported on 09/20/2016 09/11/16   Newt Minion, MD   No results found. - pertinent xrays, CT, MRI studies were reviewed and independently interpreted  Positive ROS: All other systems have been reviewed and were otherwise negative with the exception of those mentioned in the HPI and as above.  Physical Exam: General: Alert, no acute distress Psychiatric: Patient is competent for consent with normal mood and affect Lymphatic: No axillary or cervical lymphadenopathy Cardiovascular: No pedal edema Respiratory: No cyanosis, no use of accessory musculature GI: No organomegaly, abdomen is soft  and non-tender  Skin: On examination the left transmetatarsal amputation is healing well there is no cellulitis no drainage no signs of infection. Examination the right lower extremity patient has cellulitis involving the entire right calf. There is pitting edema. There is a fluid collection of the medial calf but this is not tender to palpation no focal induration.   Neurologic: Patient does not have protective sensation bilateral lower extremities.   MUSCULOSKELETAL:  Patient has no red streaking from the knee proximal on the right, the right calf is cellulitic but it is not tender to palpation. The left transmetatarsal amputation is healing nicely with no signs of breakdown. Patient states his right leg feels much better when he  is wearing the compression sock.  Assessment: #1 cellulitis right lower extremity with fluid collection medial right calf with sepsis. #2 well healing left transmetatarsal amputation  Plan: #1 Doppler to rule out DVT is pending. #2 dry dressing change left foot continue minimize weightbearing left lower extremity. #3 patient is to wear his medical compression socks around-the-clock for the right lower extremity. This may be removed for assessment of the leg. #4 I will follow-up in the hospital as needed. Follow-up in the office in 2 weeks.  Thank you for the consult and the opportunity to see Mr. [place], MD [facility] [phone] 6:41 AM

## 2016-09-25 ENCOUNTER — Inpatient Hospital Stay (INDEPENDENT_AMBULATORY_CARE_PROVIDER_SITE_OTHER): Payer: Medicare Other | Admitting: Orthopedic Surgery

## 2016-09-25 LAB — CULTURE, BLOOD (ROUTINE X 2): Culture: NO GROWTH

## 2016-09-25 LAB — BASIC METABOLIC PANEL
ANION GAP: 6 (ref 5–15)
BUN: 8 mg/dL (ref 6–20)
CALCIUM: 8.4 mg/dL — AB (ref 8.9–10.3)
CO2: 32 mmol/L (ref 22–32)
CREATININE: 0.72 mg/dL (ref 0.61–1.24)
Chloride: 103 mmol/L (ref 101–111)
Glucose, Bld: 102 mg/dL — ABNORMAL HIGH (ref 65–99)
Potassium: 3.7 mmol/L (ref 3.5–5.1)
SODIUM: 141 mmol/L (ref 135–145)

## 2016-09-25 LAB — CBC
HCT: 33.7 % — ABNORMAL LOW (ref 39.0–52.0)
Hemoglobin: 10.6 g/dL — ABNORMAL LOW (ref 13.0–17.0)
MCH: 31.9 pg (ref 26.0–34.0)
MCHC: 31.5 g/dL (ref 30.0–36.0)
MCV: 101.5 fL — ABNORMAL HIGH (ref 78.0–100.0)
PLATELETS: 207 10*3/uL (ref 150–400)
RBC: 3.32 MIL/uL — ABNORMAL LOW (ref 4.22–5.81)
RDW: 14 % (ref 11.5–15.5)
WBC: 10.3 10*3/uL (ref 4.0–10.5)

## 2016-09-25 LAB — PROTIME-INR
INR: 2.66
PROTHROMBIN TIME: 28.8 s — AB (ref 11.4–15.2)

## 2016-09-25 LAB — MAGNESIUM: MAGNESIUM: 2 mg/dL (ref 1.7–2.4)

## 2016-09-25 MED ORDER — WARFARIN SODIUM 2.5 MG PO TABS
2.5000 mg | ORAL_TABLET | Freq: Once | ORAL | Status: AC
  Administered 2016-09-25: 2.5 mg via ORAL
  Filled 2016-09-25: qty 1

## 2016-09-25 MED ORDER — ORAL CARE MOUTH RINSE
15.0000 mL | Freq: Two times a day (BID) | OROMUCOSAL | Status: DC
  Administered 2016-09-26 – 2016-10-02 (×9): 15 mL via OROMUCOSAL

## 2016-09-25 NOTE — Progress Notes (Signed)
[facility] for Infectious Disease    Date of Admission:  09/20/2016   Total days of antibiotics 6        Day 2 mero        Day 2 fluc          ID: Richard Bradley is a 83 y.o. male with IDDM with recent left foot TM amputation on 2/14 now admitted on 2/23 with fever, AMS found to have pseudomonal bacteremia nad candidemia. Unclear source Active Problems:   Chronic atrial fibrillation (HCC)   Myasthenia gravis (HCC)   Amputated toe, left (HCC)   Anticoagulated on Coumadin   Sepsis due to Gram negative bacteria (HCC)   Gram-negative bacteremia   Leukocytosis   Hypokalemia   Benign essential HTN   Goals of care, counseling/discussion   Palliative care by specialist    Subjective: Afebrile, but complains of blurry vision x 1 wk  Medications:  . escitalopram  5 mg Oral QHS  . fluconazole (DIFLUCAN) IV  400 mg Intravenous Q24H  . fluticasone  1 spray Each Nare BID  . meropenem (MERREM) IV  2 g Intravenous Q8H  . metoprolol tartrate  25 mg Oral BID  . pantoprazole  40 mg Oral Daily  . polycarbophil  625 mg Oral TID  . polyethylene glycol  17 g Oral QHS  . predniSONE  10 mg Oral q morning - 10a  . warfarin  2.5 mg Oral ONCE-1800  . Warfarin - Pharmacist Dosing Inpatient   Does not apply q1800    Objective: Vital signs in last 24 hours: Temp:  [97.4 F (36.3 C)-98 F (36.7 C)] 97.4 F (36.3 C) (02/28 1155) Pulse Rate:  [50-121] 50 (02/28 0606) Resp:  [20] 20 (02/28 1155) BP: (97-130)/(80-93) 97/80 (02/28 1155) SpO2:  [94 %-97 %] 96 % (02/28 1155) Weight:  [173 lb 6.4 oz (78.7 kg)] 173 lb 6.4 oz (78.7 kg) (02/28 0606) Physical Exam  Constitutional: He is oriented to person, place, and time. He appears well-developed and well-nourished. No distress.  HENT:  Mouth/Throat: Oropharynx is clear and moist. No oropharyngeal exudate.  Cardiovascular: Normal rate, regular rhythm and normal heart sounds. Exam reveals no gallop and no friction rub.  No murmur heard.    Pulmonary/Chest: Effort normal and breath sounds normal. No respiratory distress. He has no wheezes.  Abdominal: Soft. Bowel sounds are normal. He exhibits no distension. There is no tenderness.  Lymphadenopathy:  He has no cervical adenopathy.  Neurological: He is alert and oriented to person, place, and time.  Skin: left foot healing TM surgical incision some hyperpigmentation with 1/2 of wound still approximating edges no warmth Psychiatric: He has a normal mood and affect. His behavior is normal.     Lab Results  Recent Labs  09/24/16 0823 09/25/16 0337  WBC 12.3* 10.3  HGB 10.6* 10.6*  HCT 32.6* 33.7*  NA 137 141  K 2.9* 3.7  CL 99* 103  CO2 30 32  BUN 9 8  CREATININE 0.71 0.72    Microbiology: 2/27 blood cx ngtd 2/25 blood cx 1 of 2 set c.albicans 2/24 blood cx 1 of 2 sets PsA Studies/Results: No results found.  TTE did not suggest any veg, but technically difficult  Assessment/Plan: Pseudomonal bacteremia = patient has had past hx of pseudomonas in his left foot but has had appropriate healing since TM amputation conceivably could be related, though his foot is not overtly infection.plan to treat for 2 wks at a minimum  Candidal bacteremia = unexpected. He has no indwelling lines. Unclear source. Concern that his blurry vision, not painful is sign of endophtholmitis, eyes are not injected. Recommend to get TEE if possible. Repeat blood cx are pending  Dr Linus Salmons to provide further recs tomorrow.  Baxter Flattery [facility] for Infectious Diseases Cell: [phone] Pager: [phone]  09/25/2016, 3:45 PM

## 2016-09-25 NOTE — Progress Notes (Signed)
Patient rested well overnight. All complaints of pain addressed. VSS. Patient in bed resting caregiver at bedside.

## 2016-09-25 NOTE — Progress Notes (Signed)
Physical Therapy Treatment Patient Details Name: Richard Bradley MRN: [id number] DOB: [birth year] Today's Date: 09/25/2016    History of Present Illness 83 y.o. male  with past medical history of chronic A. Fib on coumadin, dCHF, Myasthenia Gravis, Prostate Cancer, Basal Cell cancer, recent left TMT amputation, urolithiasis, and Osteoarthritis who presented to the ED with nausea, dry heaves, and chills for one day admitted for sepsis.    PT Comments    Pt is up to chair with help of 2, nursing in to assist with PT efforts.  He is able to perform there ex with precautions for TMT, and was able to avoid WBing with his assistance to keep L foot off floor.  Otherwise pt is not attending to precautions.  Will focus on his strength and balance to progress him and will transition home as prior to this admission as surgery on foot was 2 weeks ago.   Follow Up Recommendations  Home health PT;Supervision for mobility/OOB     Equipment Recommendations  None recommended by PT    Recommendations for Other Services       Precautions / Restrictions Precautions Precautions: Fall (telemetry) Restrictions Weight Bearing Restrictions: Yes LLE Weight Bearing: Non weight bearing    Mobility  Bed Mobility Overal bed mobility: Needs Assistance Bed Mobility: Rolling;Supine to Sit Rolling: Min guard   Supine to sit: Min assist     General bed mobility comments: cues for getting to side of bed with trunk support  Transfers Overall transfer level: Needs assistance Equipment used: 2 person hand held assist Transfers: Sit to/from [facility] Sit to Stand: +2 physical assistance;+2 safety/equipment;Mod assist Stand pivot transfers: +2 safety/equipment;+2 physical assistance;Mod assist       General transfer comment: assist to power up and to avoid WBing on LLE  Ambulation/Gait             General Gait Details: non ambulatory   Stairs            Wheelchair  Mobility    Modified Rankin (Stroke Patients Only)       Balance             Standing balance-Leahy Scale: Zero                      Cognition Arousal/Alertness: Awake/alert Behavior During Therapy: WFL for tasks assessed/performed Overall Cognitive Status: History of cognitive impairments - at baseline Area of Impairment: Memory;Following commands;Safety/judgement;Awareness;Problem solving     Memory: Decreased short-term memory;Decreased recall of precautions Following Commands: Follows one step commands with increased time Safety/Judgement: Decreased awareness of safety;Decreased awareness of deficits Awareness: Intellectual Problem Solving: Slow processing;Decreased initiation;Difficulty sequencing;Requires verbal cues;Requires tactile cues      Exercises General Exercises - Lower Extremity Ankle Circles/Pumps: AROM;Both;5 reps Quad Sets: AROM;Both;10 reps Gluteal Sets: AROM;Both;10 reps    General Comments        Pertinent Vitals/Pain Pain Assessment: No/denies pain    Home Living                      Prior Function            PT Goals (current goals can now be found in the care plan section) Acute Rehab PT Goals Patient Stated Goal: get home Progress towards PT goals: Progressing toward goals    Frequency    Min 3X/week      PT Plan Current plan remains appropriate    Co-evaluation  End of Session Equipment Utilized During Treatment: Gait belt;Oxygen Activity Tolerance: Patient limited by fatigue Patient left: with call bell/phone within reach;with family/visitor present;in chair Nurse Communication: Mobility status PT Visit Diagnosis: Muscle weakness (generalized) (M62.81);Difficulty in walking, not elsewhere classified (R26.2)     Time: [id number] PT Time Calculation (min) (ACUTE ONLY): 34 min  Charges:  $Therapeutic Exercise: 8-22 mins $Therapeutic Activity: 8-22 mins                    G Codes:        Ramond Dial 10-15-16, 12:27 PM  Mee Hives, PT MS Acute Rehab Dept. Number: [place] [facility] [place]

## 2016-09-25 NOTE — Progress Notes (Signed)
Patient ID: Richard Bradley, male   DOB: October 26, 1925, 83 y.o.   MRN: [id number]  PROGRESS NOTE  Kaleb Sek Behar  VFI:[id number] DOB: March 16, 1926 DOA: 09/20/2016  PCP: [name], MD  Brief Narrative:  83 y.o. male with medical history significant for chronic atrial fibrillation on AC with coumadin, myasthenia gravis, prostate cancer, basal cell CA, recently underwent a left TMT amputation and is coming to the emergency department with nausea, dry heaves and chills since the morning of the admission.   On admission. BP was 90/48 and has improved with IV fluids to 159/109. HR was 121, RR 18-33, T max was 101.84F, oxygen saturation was 86% on room air but has improved to 95% with Rodessa oxygen support. Blood work was notable for WBC count 19.5, hemoglobin 12.1, potassium 2.8 which was supplemented. Lactic acid was 1.98. CXR showed small bilateral effusions but no focal consolidation. UA showed rare bacteria, no leukocytes. He was started on empiric broad spectrum abx until blood and urine cx results are back.   Assessment & Plan:   Active Problems:   Sepsis due to pseudomonas bacteremia and Candida Albicans now, source unclear at this time, RLE cellulitis  - Sepsis criteria met on admission with fever, tachycardia, tachypnea, leukocytosis, lactic acidosis - Blood cx on admission growing pseudomonas, final culture report pending  - vanc and zosyn stopped. Switched to Meropenem per ID recs. Will need it for 10 days - Repeat blood cx obtained 2/24 to ensure clearance of bacteremia, so far negative repeat cultures but it has growing Candida Albicans now- therefore Antifungal started. ID following -repeat cultures sent - NGTD for now  -2d echo neg for endocarditis. Possible need for TEE?    RLE cellulitis/Left transmetatarsal amputation on 09/11/16 - current ABX should be adequate in coverage  -LE dopplers to rule out DVT - neg - seen by Dr Sharol Given- he can follow up in 2 weeks in his clinic.     Chronic  atrial fibrillation (HCC) / Anticoagulated on Coumadin - CHADS vasc score 4 - On Anticoagulation with coumadin - Continue rate control with metoprolol    Myasthenia gravis ([place]) - On chronic steroids    Amputated toe, left (HCC) - Stable     Hypokalemia - Due to sepsis - Supplement as needed  - Follow up BMP in am    Benign essential HTN - Continue metoprolol   DVT prophylaxis: on coumadin  Code Status: partial code, no intubation  Family Communication: spoke with daughter over the phone.  Disposition Plan: To be determined at thsi time. He is now growing fungus in his blood stream. Cont inpatient stay.   Consultants:   Palliative care   Procedures:   None   Antimicrobials:   Vanco and zosyn 09/20/2016   Now on Meropenem and DiFlucan  Subjective: Patient doesn't have any complaints at this time.   Objective: Vitals:   09/24/16 1331 09/24/16 1956 09/25/16 0606 09/25/16 1155  BP: 110/83 130/90 (!) 122/93 97/80  Pulse: (!) 120 (!) 121 (!) 50   Resp:  _0 Temp:  98 F (36.7 C) 98 F (36.7 C) 97.4 F (36.3 C)  TempSrc:  Oral Oral Oral  SpO2:  94% 97% 96%  Weight:   78.7 kg (173 lb 6.4 oz)   Height:        Intake/Output Summary (Last 24 hours) at 09/25/16 1903 Last data filed at 09/25/16 1853  Gross per 24 hour  Intake  1100 ml  Output             1300 ml  Net             -200 ml   Filed Weights   09/23/16 0441 09/24/16 0531 09/25/16 0606  Weight: 77.6 kg (171 lb 1.6 oz) 78.1 kg (172 lb 1.6 oz) 78.7 kg (173 lb 6.4 oz)    Examination:  General exam: Disoriented, restless in am and then in the afternoon more lethargic Respiratory system: Diminished air movement at bases but no wheezing  Cardiovascular system: S1 & S2 heard, irregular rhythm, tachycardic, soft SEM appreciated  Gastrointestinal system: Abdomen is nondistended, soft and nontender. No organomegaly or masses felt. Central nervous system: o focal neurological  deficits. Extremities: Symmetric 5 x 5 power. +1 LE pitting edema; Left transmetatarsal amputation, RLE calf area with erythema and edema and warmth to touch Skin: No rashes, lesions or ulcers Psychiatry: Restless, not agitated    Data Reviewed: I have personally reviewed following labs and imaging studies  CBC:  Recent Labs Lab 09/21/16 0426 09/22/16 0418 09/23/16 0541 09/24/16 0823 09/25/16 0337  WBC 20.7* 19.6* 18.4* 12.3* 10.3  NEUTROABS 19.2*  --   --   --   --   HGB 11.4* 10.7* 10.8* 10.6* 10.6*  HCT 35.9* 33.8* 34.6* 32.6* 33.7*  MCV 100.6* 101.5* 102.4* 100.9* 101.5*  PLT 187 168 176 182 474   Basic Metabolic Panel:  Recent Labs Lab 09/20/16 2158 09/21/16 0426 09/22/16 0418 09/23/16 0541 09/24/16 0823 09/25/16 0337  NA  --  142 139 138 137 141  K  --  3.2* 3.5 3.1* 2.9* 3.7  CL  --  104 103 102 99* 103  CO2  --  _0 32  GLUCOSE  --  149* 122* 110* 97 102*  BUN  --  _1 CREATININE  --  0.80 0.78 0.79 0.71 0.72  CALCIUM  --  8.3* 8.2* 8.2* 8.2* 8.4*  MG 1.7  --   --   --  1.9 2.0   Liver Function Tests:  Recent Labs Lab 09/20/16 1948  AST 20  ALT 14*  ALKPHOS 56  BILITOT 0.9  PROT 5.8*  ALBUMIN 3.3*    Recent Labs Lab 09/20/16 1948  LIPASE 18   Coagulation Profile:  Recent Labs Lab 09/21/16 0426 09/22/16 0418 09/23/16 0541 09/24/16 0540 09/25/16 0337  INR 2.28 2.88 2.74 2.48 2.66   Urine analysis:    Component Value Date/Time   COLORURINE YELLOW 09/20/2016 2024   APPEARANCEUR HAZY (A) 09/20/2016 2024   LABSPEC 1.015 09/20/2016 2024   PHURINE 6.0 09/20/2016 2024   GLUCOSEU NEGATIVE 09/20/2016 2024   HGBUR MODERATE (A) 09/20/2016 2024   [place] NEGATIVE 09/20/2016 2024   [place] NEGATIVE 09/20/2016 2024   PROTEINUR NEGATIVE 09/20/2016 2024   NITRITE NEGATIVE 09/20/2016 2024   LEUKOCYTESUR NEGATIVE 09/20/2016 2024   Recent Results (from the past 240 hour(s))  Blood culture (routine x 2)     Status: None    Collection Time: 09/20/16  9:45 PM  Result Value Ref Range Status   Specimen Description BLOOD RIGHT FOREARM  Final   Special Requests BOTTLES DRAWN AEROBIC AND ANAEROBIC 5ML  Final   Culture NO GROWTH 5 DAYS  Final   Report Status 09/25/2016 FINAL  Final  Blood culture (routine x 2)     Status: Abnormal   Collection Time: 09/20/16 10:05 PM  Result Value Ref Range Status  Specimen Description BLOOD RIGHT HAND  Final   Special Requests IN PEDIATRIC BOTTLE 2ML  Final   Culture  Setup Time   Final    GRAM NEGATIVE RODS IN PEDIATRIC BOTTLE CRITICAL RESULT CALLED TO, READ BACK BY AND VERIFIED WITH: A. JOHNSTON, PHARM, 09/21/16 AT 1733 BY J FUDESCO    Culture PSEUDOMONAS AERUGINOSA (A)  Final   Report Status 09/23/2016 FINAL  Final   Organism ID, Bacteria PSEUDOMONAS AERUGINOSA  Final      Susceptibility   Pseudomonas aeruginosa - MIC*    CEFTAZIDIME 16 INTERMEDIATE Intermediate     CIPROFLOXACIN 1 SENSITIVE Sensitive     GENTAMICIN <=1 SENSITIVE Sensitive     IMIPENEM 2 SENSITIVE Sensitive     PIP/TAZO 32 SENSITIVE Sensitive     CEFEPIME INTERMEDIATE Intermediate     * PSEUDOMONAS AERUGINOSA  Blood Culture ID Panel (Reflexed)     Status: Abnormal   Collection Time: 09/20/16 10:05 PM  Result Value Ref Range Status   Enterococcus species NOT DETECTED NOT DETECTED Final   Listeria monocytogenes NOT DETECTED NOT DETECTED Final   Staphylococcus species NOT DETECTED NOT DETECTED Final   Staphylococcus aureus NOT DETECTED NOT DETECTED Final   Streptococcus species NOT DETECTED NOT DETECTED Final   Streptococcus agalactiae NOT DETECTED NOT DETECTED Final   Streptococcus pneumoniae NOT DETECTED NOT DETECTED Final   Streptococcus pyogenes NOT DETECTED NOT DETECTED Final   Acinetobacter baumannii NOT DETECTED NOT DETECTED Final   Enterobacteriaceae species NOT DETECTED NOT DETECTED Final   Enterobacter cloacae complex NOT DETECTED NOT DETECTED Final   Escherichia coli NOT DETECTED NOT  DETECTED Final   Klebsiella oxytoca NOT DETECTED NOT DETECTED Final   Klebsiella pneumoniae NOT DETECTED NOT DETECTED Final   Proteus species NOT DETECTED NOT DETECTED Final   Serratia marcescens NOT DETECTED NOT DETECTED Final   Carbapenem resistance NOT DETECTED NOT DETECTED Final   Haemophilus influenzae NOT DETECTED NOT DETECTED Final   Neisseria meningitidis NOT DETECTED NOT DETECTED Final   Pseudomonas aeruginosa DETECTED (A) NOT DETECTED Final    Comment: CRITICAL RESULT CALLED TO, READ BACK BY AND VERIFIED WITH: A. JOHNSTON, PHARM, 09/21/16 AT 1733 BY J FUDESCO    Candida albicans NOT DETECTED NOT DETECTED Final   Candida glabrata NOT DETECTED NOT DETECTED Final   Candida krusei NOT DETECTED NOT DETECTED Final   Candida parapsilosis NOT DETECTED NOT DETECTED Final   Candida tropicalis NOT DETECTED NOT DETECTED Final  Urine culture     Status: Abnormal   Collection Time: 09/21/16  3:08 PM  Result Value Ref Range Status   Specimen Description URINE, RANDOM  Final   Special Requests NONE  Final   Culture <10,000 COLONIES/mL INSIGNIFICANT GROWTH (A)  Final   Report Status 09/22/2016 FINAL  Final  Culture, blood (routine x 2)     Status: None (Preliminary result)   Collection Time: 09/22/16  4:28 AM  Result Value Ref Range Status   Specimen Description BLOOD RIGHT HAND  Final   Special Requests BOTTLES DRAWN AEROBIC ONLY 5CC  Final   Culture NO GROWTH 3 DAYS  Final   Report Status PENDING  Incomplete  Culture, blood (routine x 2)     Status: Abnormal   Collection Time: 09/22/16  4:34 AM  Result Value Ref Range Status   Specimen Description BLOOD LEFT HAND  Final   Special Requests BOTTLES DRAWN AEROBIC ONLY 5CC  Final   Culture  Setup Time   Final    YEAST  AEROBIC BOTTLE ONLY CRITICAL RESULT CALLED TO, READ BACK BY AND VERIFIED WITH: PHARMD Karlene Einstein [phone] MLM    Culture CANDIDA ALBICANS (A)  Final   Report Status 09/25/2016 FINAL  Final  Blood Culture ID Panel  (Reflexed)     Status: Abnormal   Collection Time: 09/22/16  4:34 AM  Result Value Ref Range Status   Enterococcus species NOT DETECTED NOT DETECTED Final   Listeria monocytogenes NOT DETECTED NOT DETECTED Final   Staphylococcus species NOT DETECTED NOT DETECTED Final   Staphylococcus aureus NOT DETECTED NOT DETECTED Final   Streptococcus species NOT DETECTED NOT DETECTED Final   Streptococcus agalactiae NOT DETECTED NOT DETECTED Final   Streptococcus pneumoniae NOT DETECTED NOT DETECTED Final   Streptococcus pyogenes NOT DETECTED NOT DETECTED Final   Acinetobacter baumannii NOT DETECTED NOT DETECTED Final   Enterobacteriaceae species NOT DETECTED NOT DETECTED Final   Enterobacter cloacae complex NOT DETECTED NOT DETECTED Final   Escherichia coli NOT DETECTED NOT DETECTED Final   Klebsiella oxytoca NOT DETECTED NOT DETECTED Final   Klebsiella pneumoniae NOT DETECTED NOT DETECTED Final   Proteus species NOT DETECTED NOT DETECTED Final   Serratia marcescens NOT DETECTED NOT DETECTED Final   Haemophilus influenzae NOT DETECTED NOT DETECTED Final   Neisseria meningitidis NOT DETECTED NOT DETECTED Final   Pseudomonas aeruginosa NOT DETECTED NOT DETECTED Final   Candida albicans DETECTED (A) NOT DETECTED Final    Comment: CRITICAL RESULT CALLED TO, READ BACK BY AND VERIFIED WITH: PHARMD Karlene Einstein [phone] MLM    Candida glabrata NOT DETECTED NOT DETECTED Final   Candida krusei NOT DETECTED NOT DETECTED Final   Candida parapsilosis NOT DETECTED NOT DETECTED Final   Candida tropicalis NOT DETECTED NOT DETECTED Final  Culture, blood (routine x 2)     Status: None (Preliminary result)   Collection Time: 09/24/16  4:44 PM  Result Value Ref Range Status   Specimen Description BLOOD RIGHT HAND  Final   Special Requests IN PEDIATRIC BOTTLE 1ML  Final   Culture NO GROWTH < 24 HOURS  Final   Report Status PENDING  Incomplete  Culture, blood (routine x 2)     Status: None (Preliminary  result)   Collection Time: 09/24/16  5:03 PM  Result Value Ref Range Status   Specimen Description BLOOD LEFT HAND  Final   Special Requests IN PEDIATRIC BOTTLE 1CC  Final   Culture NO GROWTH < 24 HOURS  Final   Report Status PENDING  Incomplete      Radiology Studies: Dg Chest 2 View  Result Date: 09/20/2016 1. Small bilateral effusions.  No focal consolidation. 2. Stable moderate cardiomegaly without overt edema 3. Extensive calcified pleural plaques Electronically Signed   By: Donavan Foil M.D.   On: 09/20/2016 21:19    Scheduled Meds: . escitalopram  5 mg Oral QHS  . fluticasone  1 spray Each Nare BID  . metoprolol tartrate  12.5 mg Oral BID  . pantoprazole  40 mg Oral Daily  . piperacillin-tazobac  3.375 g Intravenous Q8H  . predniSONE  10 mg Oral q morning - 10a  . vancomycin  1,000 mg Intravenous Q12H  . warfarin  2.5 mg Oral ONCE-1800   Continuous Infusions:    LOS: 5 days    Time spent: 25 minutes  Greater than 50% of the time spent on counseling and coordinating the care.  Ankit Arsenio Loader, MD [facility]   If 7PM-7AM, please contact night-coverage [email or web address] Password [email or web address]  09/25/2016, 7:03 PM

## 2016-09-25 NOTE — Progress Notes (Signed)
ANTICOAGULATION CONSULT NOTE - Follow Up Consult  Pharmacy Consult for warfarin Indication: atrial fibrillation  Allergies  Allergen Reactions  . Tape Other (See Comments)    PATIENT'S SKIN IS VERY THIN AND WILL TEAR AND BRUISE VERY EASILY!!    Patient Measurements: Height: 6\' 3"  (190.5 cm) Weight: 173 lb 6.4 oz (78.7 kg) (scale b) IBW/kg (Calculated) : 84.5   Vital Signs: Temp: 98 F (36.7 C) (02/28 0606) Temp Source: Oral (02/28 0606) BP: 122/93 (02/28 0606) Pulse Rate: 50 (02/28 0606)  Labs:  Recent Labs  09/23/16 0541 09/24/16 0540 09/24/16 0823 09/25/16 0337  HGB 10.8*  --  10.6* 10.6*  HCT 34.6*  --  32.6* 33.7*  PLT 176  --  182 207  LABPROT 29.5* 27.3*  --  28.8*  INR 2.74 2.48  --  2.66  CREATININE 0.79  --  0.71 0.72    Estimated Creatinine Clearance: 71 mL/min (by C-G formula based on SCr of 0.72 mg/dL).   Assessment: 83 yo male presenting with nausea, recent TMT amputation. Pt on warfarin PTA for afib.  INR therapeutic at 2.66 this morning. CBC stable, no bleeding noted. Dopplers negative.  DDI: Patient started on ABX and prednisone.   PTA warfarin dose: 5 mg MWF, 2.5 mg all other days. Pt took last dose 2/23 PTA.   Goal of Therapy:  INR 2-3 Monitor platelets by anticoagulation protocol: Yes   Plan:  Warfarin 2.5mg  tonight Daily INR, CBC Monitor PO intake, DDI, s/sx bleeding  Erin Hearing PharmD., BCPS Clinical Pharmacist Pager [phone] 09/25/2016 8:58 AM

## 2016-09-26 ENCOUNTER — Encounter (HOSPITAL_COMMUNITY): Payer: Self-pay | Admitting: Cardiology

## 2016-09-26 ENCOUNTER — Inpatient Hospital Stay (HOSPITAL_COMMUNITY): Payer: Medicare Other

## 2016-09-26 DIAGNOSIS — I4891 Unspecified atrial fibrillation: Secondary | ICD-10-CM

## 2016-09-26 DIAGNOSIS — B379 Candidiasis, unspecified: Secondary | ICD-10-CM

## 2016-09-26 DIAGNOSIS — R0603 Acute respiratory distress: Secondary | ICD-10-CM

## 2016-09-26 DIAGNOSIS — I5043 Acute on chronic combined systolic (congestive) and diastolic (congestive) heart failure: Secondary | ICD-10-CM

## 2016-09-26 DIAGNOSIS — R7881 Bacteremia: Secondary | ICD-10-CM

## 2016-09-26 DIAGNOSIS — J9601 Acute respiratory failure with hypoxia: Secondary | ICD-10-CM

## 2016-09-26 LAB — BASIC METABOLIC PANEL
ANION GAP: 6 (ref 5–15)
BUN: 7 mg/dL (ref 6–20)
CHLORIDE: 101 mmol/L (ref 101–111)
CO2: 33 mmol/L — AB (ref 22–32)
Calcium: 8.2 mg/dL — ABNORMAL LOW (ref 8.9–10.3)
Creatinine, Ser: 0.78 mg/dL (ref 0.61–1.24)
GFR calc Af Amer: >60 mL/min (ref >60)
GFR calc non Af Amer: >60 mL/min (ref >60)
GLUCOSE: 96 mg/dL (ref 65–99)
POTASSIUM: 3.3 mmol/L — AB (ref 3.5–5.1)
Sodium: 140 mmol/L (ref 135–145)

## 2016-09-26 LAB — TROPONIN I
TROPONIN I: 0.03 ng/mL — AB (ref <0.03)
Troponin I: 0.03 ng/mL (ref <0.03)
Troponin I: 0.09 ng/mL (ref <0.03)

## 2016-09-26 LAB — CBC
HEMATOCRIT: 35 % — AB (ref 39.0–52.0)
HEMOGLOBIN: 11 g/dL — AB (ref 13.0–17.0)
MCH: 32.2 pg (ref 26.0–34.0)
MCHC: 31.4 g/dL (ref 30.0–36.0)
MCV: 102.3 fL — AB (ref 78.0–100.0)
PLATELETS: 217 10*3/uL (ref 150–400)
RBC: 3.42 MIL/uL — ABNORMAL LOW (ref 4.22–5.81)
RDW: 14.4 % (ref 11.5–15.5)
WBC: 10.2 10*3/uL (ref 4.0–10.5)

## 2016-09-26 LAB — MAGNESIUM: Magnesium: 2 mg/dL (ref 1.7–2.4)

## 2016-09-26 LAB — PROTIME-INR
INR: 3.16
Prothrombin Time: 33.2 seconds — ABNORMAL HIGH (ref 11.4–15.2)

## 2016-09-26 LAB — BRAIN NATRIURETIC PEPTIDE: B Natriuretic Peptide: 624 pg/mL — ABNORMAL HIGH (ref 0.0–100.0)

## 2016-09-26 MED ORDER — ENSURE ENLIVE PO LIQD
237.0000 mL | Freq: Two times a day (BID) | ORAL | Status: DC
  Administered 2016-09-27 – 2016-10-02 (×6): 237 mL via ORAL

## 2016-09-26 MED ORDER — POTASSIUM CHLORIDE CRYS ER 20 MEQ PO TBCR
40.0000 meq | EXTENDED_RELEASE_TABLET | Freq: Once | ORAL | Status: AC
  Administered 2016-09-26: 40 meq via ORAL
  Filled 2016-09-26: qty 2

## 2016-09-26 MED ORDER — METOPROLOL TARTRATE 25 MG PO TABS: 25.0000 mg | ORAL_TABLET | Freq: Once | ORAL | Status: DC

## 2016-09-26 MED ORDER — METOPROLOL TARTRATE 25 MG PO TABS
25.0000 mg | ORAL_TABLET | Freq: Once | ORAL | Status: AC
  Administered 2016-09-26: 25 mg via ORAL

## 2016-09-26 MED ORDER — FUROSEMIDE 10 MG/ML IJ SOLN
INTRAMUSCULAR | Status: AC
  Administered 2016-09-26: 40 mg via INTRAVENOUS
  Filled 2016-09-26: qty 4

## 2016-09-26 MED ORDER — FUROSEMIDE 20 MG PO TABS: 20.0000 mg | ORAL_TABLET | Freq: Two times a day (BID) | ORAL | Status: DC

## 2016-09-26 MED ORDER — FUROSEMIDE 10 MG/ML IJ SOLN
40.0000 mg | Freq: Every day | INTRAMUSCULAR | Status: DC
  Administered 2016-09-26: 40 mg via INTRAVENOUS

## 2016-09-26 NOTE — Significant Event (Addendum)
Rapid Response Event Note  Overview: Time Called: 1045 Arrival Time: 1048 Event Type: Respiratory  Initial Focused Assessment: Patient with acute respiratory distress, O2 de sat to low 80s on [place]. Lung sounds diminished with crackles, heart tones irregular, murmur Dr Tana Coast at bedside  Interventions: Placed patient on NRB, O2 sats improved to 95% Lasix given IV 12 lead EKG done  (AF 130s) Patient repositioned Condom cath placed  Patient feeling much better Scheduled PO lopressor given for rate control  Plan of Care (if not transferred): Will begin to wean O2 to venturi mask in 30 min if patient remains stable Rn to call if patient doesn't tolerate weaning of O2 or if he has additional distress or if other assistance needed.  Event Summary: Name of Physician Notified: Dr Tana Coast at bedside at      at    Outcome: Stayed in room and stabalized  Event End Time: [place]  Raliegh Ip

## 2016-09-26 NOTE — Progress Notes (Signed)
Pt states that he feels better weaned 02 to 5 l n/c , MD aware and Rapid Response, may have protein liquids.

## 2016-09-26 NOTE — Progress Notes (Signed)
ANTICOAGULATION CONSULT NOTE - Follow Up Consult  Pharmacy Consult for warfarin Indication: atrial fibrillation  Allergies  Allergen Reactions  . Tape Other (See Comments)    PATIENT'S SKIN IS VERY THIN AND WILL TEAR AND BRUISE VERY EASILY!!    Patient Measurements: Height: 6\' 3"  (190.5 cm) Weight: 168 lb 12.8 oz (76.6 kg) IBW/kg (Calculated) : 84.5   Vital Signs: Temp: 97.8 F (36.6 C) (03/01 0631) Temp Source: Oral (03/01 0631) BP: 132/95 (03/01 1044) Pulse Rate: 138 (03/01 1044)  Labs:  Recent Labs  09/24/16 0540  09/24/16 0823 09/25/16 0337 09/26/16 0357  HGB  --   < > 10.6* 10.6* 11.0*  HCT  --   --  32.6* 33.7* 35.0*  PLT  --   --  182 207 217  LABPROT 27.3*  --   --  28.8* 33.2*  INR 2.48  --   --  2.66 3.16  CREATININE  --   --  0.71 0.72 0.78  < > = values in this interval not displayed.  Estimated Creatinine Clearance: 69.2 mL/min (by C-G formula based on SCr of 0.78 mg/dL).   Assessment: 83 yo male presenting with nausea, recent TMT amputation. Pt on warfarin PTA for afib.  INR just above goal at 3.1 this morning. CBC stable, no bleeding noted. Dopplers negative. Concern with INR trend given that she is on fluconazole so will hold dose tonight and plan on resuming lower dose tomorrow if stable.   DDI: Patient started on fluconazole and prednisone.   PTA warfarin dose: 5 mg MWF, 2.5 mg all other days. Pt took last dose 2/23 PTA.   Goal of Therapy:  INR 2-3 Monitor platelets by anticoagulation protocol: Yes   Plan:  Hold Warfarin tonight Daily INR, CBC Monitor PO intake, DDI, s/sx bleeding  Erin Hearing PharmD., BCPS Clinical Pharmacist Pager [phone] 09/26/2016 10:58 AM

## 2016-09-26 NOTE — Progress Notes (Signed)
Patient with no complaints or concerns during 7pm - 7am shift.  Imogean Ciampa, RN 

## 2016-09-26 NOTE — Consult Note (Signed)
CARDIOLOGY CONSULT NOTE   Patient ID: Richard Bradley MRN: [id number] DOB/AGE: 01-Jul-1926 83 y.o.  Admit date: 09/20/2016  Primary Physician   [name], MD Primary Cardiologist   Dr. Tamala Julian (Previously in [place]) Reason for Consultation   CHF Requesting Physician  Dr. Tana Coast  HPI: ARJUNA CROUSE is a 83 y.o. male with a history of permanent atrial fibrillation/flutter on coumadin for anticoagulation, chronic combined CHF, HTN, DM, left TMT amputation, chronic dyspnea and possible pulmonary fibrosis admitted for sepsis 2nd to bacteremia.   Seen by Dr. Tamala Julian 07/05/16 to establish care for arrhthymias. Echo  In 2013 showed EF of 55%. He moved from [place]. Per daughter patient always has Low EF. Cardiac catheterization approximately 5-6 years ago at [facility] showed normal coronaries. No records for review.  Patient had transmetatarsal amputation left foot 09/11/16 failure of conservative wound care.   He came to ER 09/21/16 with acute onset nausea, dry heaves and chills. BP was 90/48 and has improved with IV fluids to 159/109. CXR showed small bilateral effusions but no focal consolidation. Lasix is held since admission. Treated with abx for sepsis due to pseudomonas bacteremia,  unclear source, RLE cellulitis. Later grow Candida albicans.  Patient was seen by ID due to unclear source of bacteriemia. Amputation  appearing well healing. Recommended TEE.   Echo this admission showed: LVEF 35-40%, severely dilated LV with global hypokinesis and normal wall thickness, moderate AI, severe posteriorly directed MR, massive biatrial enlargement, mild RVE with reduced RV systolic function, moderate TR, RVSP 52 mmHg, dilated IVC, trivial pericardial effusion with left pleural effusion. Findings consistent with dilated cardiomyopathy.  This morning patient developed acute shortness of breath with hypoxia. Given oxygen and  IV Lasix. Pending repeat chest x-ray and BNP level.  Daughter at  bedside provided further history. Last dose of Lasix Friday night 2/23. In the past patient also had a respiratory distress while off diuretics.  EKG on admission showed A. fib at rate of 116 bpm. EKG this morning showed A. fib at rate of 126 bpm. No acute changes. Personally reviewed.   Recent denies any orthopnea, PND, syncope, chest pain, palpitation prior to admission.   Past Medical History:  Diagnosis Date  . Atrial fibrillation ([place])   . Cellulitis    left arm  . CHF (congestive heart failure) ([place])   . Constipation   . Dysrhythmia    hx of atrial fibrilation  . GERD (gastroesophageal reflux disease)    ocassional  . History of kidney stones   . Myasthenia gravis   . Osteoarthritis   . Pneumonia    Hstory of  . 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 Prostate cancer ([place]) dx'd 1982   treated with surgery and radiation.  Basal cell cancer  . Sepsis ([place])   . Shortness of breath dyspnea    with exertion  . Urine incontinence   . Varicose veins of left lower extremity      Past Surgical History:  Procedure Laterality Date  . AMPUTATION Left 03/20/2016   Procedure: Left Great Toe Amputation at Metatarsophalangeal Joint;  Surgeon: Newt Minion, MD;  Location: [facility];  Service: Orthopedics;  Laterality: Left;  . AMPUTATION Left 06/11/2016   Procedure: Left 2nd Toe Amputation;  Surgeon: Newt Minion, MD;  Location: [facility];  Service: Orthopedics;  Laterality: Left;  . AMPUTATION Left 07/12/2016   Procedure: LEFT 3rd TOE AMPUTATION;  Surgeon: Newt Minion, MD;  Location: [facility];  Service: Orthopedics;  Laterality: Left;  . AMPUTATION Left 09/11/2016   Procedure: Left Transmetatarsal Amputation;  Surgeon: Newt Minion, MD;  Location: [facility];  Service: Orthopedics;  Laterality: Left;  . appendectomy    . APPENDECTOMY    . COLONOSCOPY    . HERNIA REPAIR Bilateral    Inguinal  . I&D EXTREMITY Left 10/12/2014   Procedure: IRRIGATION AND DEBRIDEMENT INDEX FINGER;   Surgeon: Iran Planas, MD;  Location: [facility];  Service: Orthopedics;  Laterality: Left;  . KYPHOPLASTY  2012 ?   T 12- L1  . LAMINECTOMY  2005 ?   lumbar- 4-5  . PROSTATE SURGERY    . ROTATOR CUFF REPAIR Right   . TOTAL KNEE ARTHROPLASTY Right     Allergies  Allergen Reactions  . Tape Other (See Comments)    PATIENT'S SKIN IS VERY THIN AND WILL TEAR AND BRUISE VERY EASILY!!    I have reviewed the patient's current medications . escitalopram  5 mg Oral QHS  . fluconazole (DIFLUCAN) IV  400 mg Intravenous Q24H  . fluticasone  1 spray Each Nare BID  . furosemide  40 mg Intravenous Daily  . mouth rinse  15 mL Mouth Rinse BID  . meropenem (MERREM) IV  2 g Intravenous Q8H  . metoprolol tartrate  25 mg Oral BID  . pantoprazole  40 mg Oral Daily  . polycarbophil  625 mg Oral TID  . polyethylene glycol  17 g Oral QHS  . potassium chloride  40 mEq Oral Once  . predniSONE  10 mg Oral q morning - 10a  . Warfarin - Pharmacist Dosing Inpatient   Does not apply q1800    acetaminophen, guaiFENesin, HYDROcodone-acetaminophen, ipratropium, ondansetron **OR** ondansetron (ZOFRAN) IV  Prior to Admission medications   Medication Sig Start Date End Date Taking? Authorizing Provider  Cholecalciferol (VITAMIN D-3 PO) Take 2,000 Units by mouth daily.    Yes Historical Provider, MD  escitalopram (LEXAPRO) 5 MG tablet Take 5 mg by mouth at bedtime. 09/15/16  Yes Historical Provider, MD  FIBER PO Take 1 tablet by mouth 3 (three) times daily.    Yes Historical Provider, MD  fluticasone (FLONASE) 50 MCG/ACT nasal spray Place 1 spray into both nostrils 2 (two) times daily.    Yes Historical Provider, MD  furosemide (LASIX) 20 MG tablet Take 40 mg once daily for 5 days. Then resume 20 mg twice daily. Patient taking differently: Take 20 mg by mouth 2 (two) times daily.  07/01/16  Yes Sedalia Muta, FNP  guaiFENesin (MUCINEX) 600 MG 12 hr tablet Take 600 mg by mouth 2 (two) times daily as needed for  to loosen phlegm.   Yes Historical Provider, MD  HYDROcodone-acetaminophen (NORCO) 10-325 MG per tablet Take 1 tablet by mouth every 6 (six) hours as needed (for pain).    Yes Historical Provider, MD  ipratropium (ATROVENT) 0.03 % nasal spray Place 2 sprays into both nostrils 3 (three) times daily as needed (for congestion).   Yes Historical Provider, MD  metoprolol tartrate (LOPRESSOR) 25 MG tablet Take 12.5 mg by mouth 2 (two) times daily.    Yes Historical Provider, MD  predniSONE (DELTASONE) 10 MG tablet Take 2 tablets (20 mg total) by mouth daily. Patient taking differently: Take 20 mg by mouth every morning.  04/19/16  Yes Kathrynn Ducking, MD  vitamin B-12 (CYANOCOBALAMIN) 1000 MCG tablet Take 1,000 mcg by mouth daily.   Yes Historical Provider, MD  warfarin (COUMADIN) 5 MG tablet Take as directed by  coumadin clinic Patient taking differently: Take 2.5-5 mg by mouth See admin instructions. 2.5 mg at lunch(time) on Sun/Tues/Thurs/Sat and 5 mg on Mon/Wed/Fri 07/11/16  Yes Belva Crome, MD  HYDROcodone-acetaminophen [facility]) 5-325 MG tablet Take 1 tablet by mouth every 6 (six) hours as needed. Patient not taking: Reported on 09/20/2016 09/11/16   Newt Minion, MD     Social History   Social History  . Marital status: Widowed    Spouse name: N/A  . Number of children: 2  . Years of education: 12   Occupational History  . Retired [employer]   Social History Main Topics  . Smoking status: Never Smoker  . Smokeless tobacco: Never Used  . Alcohol use 0.0 oz/week     Comment: occasional beer  . Drug use: No  . Sexual activity: Not on file     Comment: Widowed   Other Topics Concern  . Not on file   Social History Narrative   From [place], moving to [place]   Widowed   Right-handed   Caffeine: 1 cup of coffee per day    Family Status  Relation Status  . Father Deceased  . Brother Deceased  . Paternal Grandfather Deceased  . Mother Deceased   . Sister Deceased  . Maternal Grandmother Deceased  . Maternal Grandfather Deceased  . Paternal Grandmother Deceased   Family History  Problem Relation Age of Onset  . Heart disease Father   . Heart attack Father   . Hypertension Father   . Heart disease Brother   . Heart disease Paternal Grandfather   . Stroke Mother   . Parkinsonism Sister   . Heart disease Sister   . Dementia Sister     ROS:  Full 14 point review of systems complete and found to be negative unless listed above.  Physical Exam: Blood pressure (!) 132/95, pulse (!) 138, temperature 97.8 F (36.6 C), temperature source Oral, resp. rate 18, height 6\' 3"  (1.905 m), weight 168 lb 12.8 oz (76.6 kg), SpO2 94 %.  General: Well developed, well nourished, male in no acute distress Head: Eyes PERRLA, No xanthomas. Normocephalic and atraumatic, oropharynx without edema or exudate.  Lungs: Resp regular and unlabored, faint rales.  Heart: Ir Ir tachycardiac  no s3, s4, or systolic murmur   Neck: No carotid bruits. No lymphadenopathy.  No JVD. Abdomen: Bowel sounds present, abdomen distended Msk:  No spine or cva tenderness. No weakness, no joint deformities or effusions. Extremities: No clubbing, cyanosis. TMT amputation. RLE has erythema. DP/PT/Radials 2+ and equal bilaterally. Neuro: Alert and oriented X 3. No focal deficits noted. Trace to 1+ BL LE edema.  Psych:  Good affect, responds appropriately Skin: No rashes or lesions noted.  Labs:   Lab Results  Component Value Date   WBC 10.2 09/26/2016   HGB 11.0 (L) 09/26/2016   HCT 35.0 (L) 09/26/2016   MCV 102.3 (H) 09/26/2016   PLT 217 09/26/2016    Recent Labs  09/26/16 0357  INR 3.16    Recent Labs Lab 09/20/16 1948  09/26/16 0357  NA 140  < > 140  K 2.8*  < > 3.3*  CL 100*  < > 101  CO2 28  < > 33*  BUN 15  < > 7  CREATININE 0.78  < > 0.78  CALCIUM 8.9  < > 8.2*  PROT 5.8*  --   --   BILITOT 0.9  --   --  ALKPHOS 56  --   --   ALT 14*  --    --   AST 20  --   --   GLUCOSE 119*  < > 96  ALBUMIN 3.3*  --   --   < > = values in this interval not displayed. Magnesium  Date Value Ref Range Status  09/26/2016 2.0 1.7 - 2.4 mg/dL Final    Recent Labs  09/26/16 1012  TROPONINI 0.03*   No results for input(s): TROPIPOC in the last 72 hours. Pro B Natriuretic peptide (BNP)  Date/Time Value Ref Range Status  03/31/2012 04:44 PM 375.0 (H) 0.0 - 100.0 pg/mL Final   No results found for: CHOL, HDL, LDLCALC, TRIG Lab Results  Component Value Date   DDIMER 0.59 (H) 07/01/2016   Lipase  Date/Time Value Ref Range Status  09/20/2016 07:48 PM 18 11 - 51 U/L Final   TSH  Date/Time Value Ref Range Status  09/26/2011 04:33 PM 0.69 0.35 - 5.50 uIU/mL Final   Vitamin B-12  Date/Time Value Ref Range Status  09/26/2011 04:33 PM 245 211 - 911 pg/mL Final    Echo: 09/24/16 ------------------------------------------------------------------- Study Conclusions  - Left ventricle: The cavity size was severely dilated. Wall   thickness was normal. Systolic function was moderately reduced.   The estimated ejection fraction was in the range of 35% to 40%.   Diffuse hypokinesis. The study is not technically sufficient to   allow evaluation of LV diastolic function. - Aortic valve: Sclerosis without stenosis. There was moderate   regurgitation. - Mitral valve: Mildly thickened leaflets . There was severe   regurgitation directed posteriorly. - Left atrium: Massively dilated (115 ml/m2). - Right ventricle: The cavity size was mildly dilated. Reduced RV   systolic function. Lateral annulus peak S velocity: 7.62 cm/s. - Right atrium: Massively dilated (90 ml/m2). - Tricuspid valve: There was moderate regurgitation. - Pulmonary arteries: PA peak pressure: 52 mm Hg (S). - Inferior vena cava: The vessel was dilated. The respirophasic   diameter changes were blunted (< 50%), consistent with elevated   central venous pressure. -  Pericardium, extracardiac: A trivial pericardial effusion was   identified posterior to the heart. There was a left pleural   effusion.  Recommendations:  LVEF 35-40%, severely dilated LV with global hypokinesis and normal wall thickness, moderate AI, severe posteriorly directed MR, massive biatrial enlargement, mild RVE with reduced RV systolic function, moderate TR, RVSP 52 mmHg, dilated IVC, trivial pericardial effusion with left pleural effusion. Findings consistent with dilated cardiomyopathy.    Radiology:  No results found.  ASSESSMENT AND PLAN:     1. Acute respiratory failure with hypoxia - Likely due to holding Lasix since admission. He was also given fluids during admission for hypotension. He was on Lasix 20 mg twice a day at home. Given IV Lasix 40 mg today by IM and now on daily dose. Pending BNP and chest x-ray. Exam consistent with volume overload. Further diuretics adjustment by M.D. Seems need BID another dose tonight.   2. Acute on chronic combined CHF - Echo showed ef of 35-40% with valvular abnormality. EF was 55% on echo 2013 in  our system. Daughter states that his EF is always low. Cath  few years ago showed normal coronaries. No outside records available for review. - May consider ischemic evaluation after recovering from acute illness if needed.  3. Severe MR - pending TEE  4. Poly bacteremia - cancelled TEE today due to acute respiratory failure.  It's schedule for tomorrow.      After careful review of history and examination, the risks and benefits of transesophageal echocardiogram have been explained including risks of esophageal damage, perforation (1:10,000 risk), bleeding, pharyngeal hematoma as well as other potential complications associated with conscious sedation including aspiration, arrhythmia, respiratory failure and death. Alternatives to treatment were discussed, questions were answered. Patient is willing to proceed. NPO after midnight. Meds  with sips.   5. Permeant atrial fibrillation - Rate elevated to 120s.  likely due to acute illness. Blood pressure stable. May consider increasing beta blocker dose. Continue Coumadin for pharmacy.  6. RLE cellulitis and left TMT amputation  - Doppler negative for DVT    Signed: Bhagat,Bhavinkumar, PA 09/26/2016, 11:34 AM Pager [phone]  Co-Sign MD  History and all data above reviewed.  Patient examined.  I agree with the findings as above. Very nice gentleman with flash acute pulmonary edema while off of diuretics.  He was being scheduled for a TEE to evaluate for bacteremia.  He denies chest pain.  He is feeling much better since diuresis.   The patient exam reveals [id number]  ,  Lungs: Decreased breath sounds  ,  Abd: Positive bowel sounds, no rebound no guarding, Ext Mild edema and chronic venous stasis changes.   .  All available labs, radiology testing, previous records reviewed. Agree with documented assessment and plan.   Continue IV diuresis.  He is on the schedule for a TEE tomorrow but we will need to assess prior to this in the AM to make sure is breathing will allow.  Probably switch to PO Lasix in the AM.    Minus Breeding  2:39 PM  09/26/2016

## 2016-09-26 NOTE — Progress Notes (Signed)
[facility]                                                                              Patient Demographics  Richard Bradley, is a 83 y.o. male, DOB - 1925/08/30, PQZ:[id number]  Admit date - 09/20/2016   Admitting Physician [name], MD  Outpatient Primary MD for the patient is [name], MD  Outpatient specialists:   LOS - 6  days    Chief Complaint  Patient presents with  . Nausea  . Fever  . flu like symptoms       Brief summary   83 y.o.malewith medical history significant for chronic atrial fibrillation on AC with coumadin, myasthenia gravis, prostate cancer, basal cell CA, recently underwent a left TMTamputation and is coming to the emergency department with nausea, dry heaves and chills since the morning of the admission.  On admission. BP was 90/48 and has improved with IV fluids to 159/109. HR was 121, RR 18-33, T max was 101.50F, oxygen saturation was 86% on room air but has improved to 95% with Klein oxygen support. Blood work was notable for WBC count 19.5, hemoglobin 12.1, potassium 2.8 which was supplemented. Lactic acid was 1.98. CXR showed small bilateral effusions but no focal consolidation. UA showed rare bacteria, no leukocytes. He was started on empiric broad spectrum abx until blood and urine cx results are back.  I assumed care on 09/26/16  Assessment & Plan    Principal problem Acute hypoxic respiratory failure/pulmonary edema and due to acute on chronic systolic CHF exacerbation - Likely due to volume overload, patient's Lasix was held through the hospitalization. Rapid response was called this morning due to shortness of breath, hypoxia with O2 sats low 80s, on 3 L eventually increased to NRB.  - I had just examined the patient a few minutes before the rapid response and had noticed that Lasix was held due to sepsis, lactic acidosis and hypotension, patient was on Lasix outpatient. I had started him back on Lasix this  morning. However due to rapid response, stat chest x-ray done showed stable cardiomegaly with moderate bibasilar opacities concerning for edema moderate lactose is with associated pleural effusions. BNP 624, troponin 0.03 - Stat EKG showed atrial fibrillation with RVR 130s - Placed on IV Lasix 40 mg daily, first dose now, cardiology consulted - 2-D echo 2/27 showed EF of 35-40% with diffuse hypokinesis, massive bilateral enlargement, reduced RV systolic function, moderate TR, findings consistent with dilated cardiomyopathy   Active Problems:   Sepsis due to pseudomonas bacteremia and Candida Albicans now, source unclear at this time, RLE cellulitis  - Sepsis criteria met on admission with fever, tachycardia, tachypnea, leukocytosis, lactic acidosis - Blood cx on admission was positive for Pseudomonas.   -Initially started on vancomycin and Zosyn, ID was consulted, recommended meropenem. - Repeat blood cultures 2/25 showed Candida albicans. Started on Diflucan IV. Source unclear - 2-D echo showed EF 35-40%, no vegetations. - Cardiology consulted for TEE. NPO after midnight.    RLE cellulitis/Left transmetatarsal amputation on 09/11/16 - current ABX should be adequate in coverage  - LE dopplers negative for DVT - seen  by Dr Sharol Given- he can follow up in 2 weeks in his clinic.     Chronic atrial fibrillation (HCC) with RVR - CHADS vasc score 4 - On Anticoagulation with coumadin - Continue rate control with metoprolol - Cardiology consulted    Myasthenia gravis ([place]) - On chronic steroids    Amputated toe, left (HCC) - Stable     Hypokalemia - Replaced    Benign essential HTN - Continue metoprolol    Code Status: Partial code, no intubation DVT Prophylaxis:  Coumadin Family Communication: Discussed in detail with the patient, all imaging results, lab results explained to the patient, detailed report to the caregiver and patient's daughter was on the phone for first few  minutes   Disposition Plan:   Time Spent in minutes  25 minutes  Procedures:  echo  Consultants:   Cardiology  ID   Anti microbials:   Vanco and zosyn 09/20/2016   Now on Meropenem and DiFlucan   Medications  Scheduled Meds: . escitalopram  5 mg Oral QHS  . fluconazole (DIFLUCAN) IV  400 mg Intravenous Q24H  . fluticasone  1 spray Each Nare BID  . furosemide  40 mg Intravenous Daily  . mouth rinse  15 mL Mouth Rinse BID  . meropenem (MERREM) IV  2 g Intravenous Q8H  . metoprolol tartrate  25 mg Oral BID  . pantoprazole  40 mg Oral Daily  . polycarbophil  625 mg Oral TID  . polyethylene glycol  17 g Oral QHS  . potassium chloride  40 mEq Oral Once  . predniSONE  10 mg Oral q morning - 10a  . Warfarin - Pharmacist Dosing Inpatient   Does not apply q1800   Continuous Infusions: PRN Meds:.acetaminophen, guaiFENesin, HYDROcodone-acetaminophen, ipratropium, ondansetron **OR** ondansetron (ZOFRAN) IV   Antibiotics   Anti-infectives    Start     Dose/Rate Route Frequency Ordered Stop   09/25/16 0900  fluconazole (DIFLUCAN) IVPB 400 mg     400 mg 100 mL/hr over 120 Minutes Intravenous Every 24 hours 09/24/16 0832     09/24/16 0900  anidulafungin (ERAXIS) 200 mg in sodium chloride 0.9 % 200 mL IVPB  Status:  Discontinued     200 mg over 180 Minutes Intravenous Every 24 hours 09/24/16 0817 09/24/16 0832   09/24/16 0900  meropenem (MERREM) 2 g in sodium chloride 0.9 % 100 mL IVPB     2 g 200 mL/hr over 30 Minutes Intravenous Every 8 hours 09/24/16 0827     09/24/16 0900  fluconazole (DIFLUCAN) IVPB 800 mg     800 mg 200 mL/hr over 120 Minutes Intravenous  Once 09/24/16 0832 09/24/16 1504   09/23/16 1800  ciprofloxacin (CIPRO) tablet 750 mg  Status:  Discontinued     750 mg Oral 2 times daily 09/23/16 1330 09/24/16 0816   09/22/16 2300  vancomycin (VANCOCIN) 1,250 mg in sodium chloride 0.9 % 250 mL IVPB  Status:  Discontinued     1,250 mg 166.7 mL/hr over 90 Minutes  Intravenous Every 12 hours 09/22/16 1227 09/23/16 0851   09/21/16 0800  piperacillin-tazobactam (ZOSYN) IVPB 3.375 g  Status:  Discontinued     3.375 g 12.5 mL/hr over 240 Minutes Intravenous Every 8 hours 09/21/16 0733 09/23/16 1330   09/21/16 0730  piperacillin-tazobactam (ZOSYN) IVPB 3.375 g  Status:  Discontinued     3.375 g 100 mL/hr over 30 Minutes Intravenous Every 8 hours 09/21/16 0724 09/21/16 0730   09/20/16 2300  vancomycin (VANCOCIN) IVPB 1000  mg/200 mL premix  Status:  Discontinued     1,000 mg 200 mL/hr over 60 Minutes Intravenous Every 12 hours 09/20/16 2247 09/22/16 1227   09/20/16 2145  cefTRIAXone (ROCEPHIN) 1 g in dextrose 5 % 50 mL IVPB  Status:  Discontinued     1 g 100 mL/hr over 30 Minutes Intravenous  Once 09/20/16 2130 09/20/16 2132   09/20/16 2145  piperacillin-tazobactam (ZOSYN) IVPB 3.375 g     3.375 g 100 mL/hr over 30 Minutes Intravenous  Once 09/20/16 2132 09/20/16 2318        Subjective:   Richard Bradley was seen and examined today. Please see above, rapid response called due to acute shortness of breath and chest tightness. Denies any nausea or vomiting, no fevers or chills.   Patient denies dizziness, abdominal pain, N/V/D/C, new weakness, numbess, tingling. No acute events overnight.    Objective:   Vitals:   09/26/16 1035 09/26/16 1044 09/26/16 1110 09/26/16 1122  BP:  (!) 132/95    Pulse:  (!) 138    Resp:      Temp:      TempSrc:      SpO2: (!) 86% 95% 93% 94%  Weight:      Height:        Intake/Output Summary (Last 24 hours) at 09/26/16 1222 Last data filed at 09/26/16 1115  Gross per 24 hour  Intake             1380 ml  Output             1000 ml  Net              380 ml     Wt Readings from Last 3 Encounters:  09/26/16 76.6 kg (168 lb 12.8 oz)  09/11/16 81.6 kg (180 lb)  08/12/16 81.2 kg (179 lb)     Exam  General: Alert and oriented x 3, NAD  HEENT:    Neck: Supple, no JVD, no masses  Cardiovascular: S1 S2  auscultated, Irregularly irregular, tachycardia   Respiratory: Bibasilar crackles  Gastrointestinal: Soft, nontender, nondistended, + bowel sounds  Ext: no cyanosis clubbing. Right lower extremity with erythema and edema 1+, dressing intact on the left lower extremity, and TMT amputation   Neuro: AAOx3, Cr N's II- XII. Strength 5/5 upper and lower extremities bilaterally  Skin: No rashes  Psych: Normal affect and demeanor, alert and oriented x3    Data Reviewed:  I have personally reviewed following labs and imaging studies  Micro Results Recent Results (from the past 240 hour(s))  Blood culture (routine x 2)     Status: None   Collection Time: 09/20/16  9:45 PM  Result Value Ref Range Status   Specimen Description BLOOD RIGHT FOREARM  Final   Special Requests BOTTLES DRAWN AEROBIC AND ANAEROBIC 5ML  Final   Culture NO GROWTH 5 DAYS  Final   Report Status 09/25/2016 FINAL  Final  Blood culture (routine x 2)     Status: Abnormal   Collection Time: 09/20/16 10:05 PM  Result Value Ref Range Status   Specimen Description BLOOD RIGHT HAND  Final   Special Requests IN PEDIATRIC BOTTLE 2ML  Final   Culture  Setup Time   Final    GRAM NEGATIVE RODS IN PEDIATRIC BOTTLE CRITICAL RESULT CALLED TO, READ BACK BY AND VERIFIED WITH: A. JOHNSTON, PHARM, 09/21/16 AT 1733 BY J FUDESCO    Culture PSEUDOMONAS AERUGINOSA (A)  Final   Report Status 09/23/2016  FINAL  Final   Organism ID, Bacteria PSEUDOMONAS AERUGINOSA  Final      Susceptibility   Pseudomonas aeruginosa - MIC*    CEFTAZIDIME 16 INTERMEDIATE Intermediate     CIPROFLOXACIN 1 SENSITIVE Sensitive     GENTAMICIN <=1 SENSITIVE Sensitive     IMIPENEM 2 SENSITIVE Sensitive     PIP/TAZO 32 SENSITIVE Sensitive     CEFEPIME INTERMEDIATE Intermediate     * PSEUDOMONAS AERUGINOSA  Blood Culture ID Panel (Reflexed)     Status: Abnormal   Collection Time: 09/20/16 10:05 PM  Result Value Ref Range Status   Enterococcus species NOT  DETECTED NOT DETECTED Final   Listeria monocytogenes NOT DETECTED NOT DETECTED Final   Staphylococcus species NOT DETECTED NOT DETECTED Final   Staphylococcus aureus NOT DETECTED NOT DETECTED Final   Streptococcus species NOT DETECTED NOT DETECTED Final   Streptococcus agalactiae NOT DETECTED NOT DETECTED Final   Streptococcus pneumoniae NOT DETECTED NOT DETECTED Final   Streptococcus pyogenes NOT DETECTED NOT DETECTED Final   Acinetobacter baumannii NOT DETECTED NOT DETECTED Final   Enterobacteriaceae species NOT DETECTED NOT DETECTED Final   Enterobacter cloacae complex NOT DETECTED NOT DETECTED Final   Escherichia coli NOT DETECTED NOT DETECTED Final   Klebsiella oxytoca NOT DETECTED NOT DETECTED Final   Klebsiella pneumoniae NOT DETECTED NOT DETECTED Final   Proteus species NOT DETECTED NOT DETECTED Final   Serratia marcescens NOT DETECTED NOT DETECTED Final   Carbapenem resistance NOT DETECTED NOT DETECTED Final   Haemophilus influenzae NOT DETECTED NOT DETECTED Final   Neisseria meningitidis NOT DETECTED NOT DETECTED Final   Pseudomonas aeruginosa DETECTED (A) NOT DETECTED Final    Comment: CRITICAL RESULT CALLED TO, READ BACK BY AND VERIFIED WITH: A. JOHNSTON, PHARM, 09/21/16 AT 1733 BY J FUDESCO    Candida albicans NOT DETECTED NOT DETECTED Final   Candida glabrata NOT DETECTED NOT DETECTED Final   Candida krusei NOT DETECTED NOT DETECTED Final   Candida parapsilosis NOT DETECTED NOT DETECTED Final   Candida tropicalis NOT DETECTED NOT DETECTED Final  Urine culture     Status: Abnormal   Collection Time: 09/21/16  3:08 PM  Result Value Ref Range Status   Specimen Description URINE, RANDOM  Final   Special Requests NONE  Final   Culture <10,000 COLONIES/mL INSIGNIFICANT GROWTH (A)  Final   Report Status 09/22/2016 FINAL  Final  Culture, blood (routine x 2)     Status: None (Preliminary result)   Collection Time: 09/22/16  4:28 AM  Result Value Ref Range Status   Specimen  Description BLOOD RIGHT HAND  Final   Special Requests BOTTLES DRAWN AEROBIC ONLY 5CC  Final   Culture NO GROWTH 4 DAYS  Final   Report Status PENDING  Incomplete  Culture, blood (routine x 2)     Status: Abnormal   Collection Time: 09/22/16  4:34 AM  Result Value Ref Range Status   Specimen Description BLOOD LEFT HAND  Final   Special Requests BOTTLES DRAWN AEROBIC ONLY 5CC  Final   Culture  Setup Time   Final    YEAST AEROBIC BOTTLE ONLY CRITICAL RESULT CALLED TO, READ BACK BY AND VERIFIED WITH: Sandria Bales [id number] [phone] MLM    Culture CANDIDA ALBICANS (A)  Final   Report Status 09/25/2016 FINAL  Final  Blood Culture ID Panel (Reflexed)     Status: Abnormal   Collection Time: 09/22/16  4:34 AM  Result Value Ref Range Status   Enterococcus species NOT  DETECTED NOT DETECTED Final   Listeria monocytogenes NOT DETECTED NOT DETECTED Final   Staphylococcus species NOT DETECTED NOT DETECTED Final   Staphylococcus aureus NOT DETECTED NOT DETECTED Final   Streptococcus species NOT DETECTED NOT DETECTED Final   Streptococcus agalactiae NOT DETECTED NOT DETECTED Final   Streptococcus pneumoniae NOT DETECTED NOT DETECTED Final   Streptococcus pyogenes NOT DETECTED NOT DETECTED Final   Acinetobacter baumannii NOT DETECTED NOT DETECTED Final   Enterobacteriaceae species NOT DETECTED NOT DETECTED Final   Enterobacter cloacae complex NOT DETECTED NOT DETECTED Final   Escherichia coli NOT DETECTED NOT DETECTED Final   Klebsiella oxytoca NOT DETECTED NOT DETECTED Final   Klebsiella pneumoniae NOT DETECTED NOT DETECTED Final   Proteus species NOT DETECTED NOT DETECTED Final   Serratia marcescens NOT DETECTED NOT DETECTED Final   Haemophilus influenzae NOT DETECTED NOT DETECTED Final   Neisseria meningitidis NOT DETECTED NOT DETECTED Final   Pseudomonas aeruginosa NOT DETECTED NOT DETECTED Final   Candida albicans DETECTED (A) NOT DETECTED Final    Comment: CRITICAL RESULT CALLED TO, READ  BACK BY AND VERIFIED WITH: PHARMD N BATCHELDER [id number] MLM    Candida glabrata NOT DETECTED NOT DETECTED Final   Candida krusei NOT DETECTED NOT DETECTED Final   Candida parapsilosis NOT DETECTED NOT DETECTED Final   Candida tropicalis NOT DETECTED NOT DETECTED Final  Culture, blood (routine x 2)     Status: None (Preliminary result)   Collection Time: 09/24/16  4:44 PM  Result Value Ref Range Status   Specimen Description BLOOD RIGHT HAND  Final   Special Requests IN PEDIATRIC BOTTLE 1ML  Final   Culture NO GROWTH 2 DAYS  Final   Report Status PENDING  Incomplete  Culture, blood (routine x 2)     Status: None (Preliminary result)   Collection Time: 09/24/16  5:03 PM  Result Value Ref Range Status   Specimen Description BLOOD LEFT HAND  Final   Special Requests IN PEDIATRIC BOTTLE 1CC  Final   Culture NO GROWTH 2 DAYS  Final   Report Status PENDING  Incomplete    Radiology Reports Dg Chest 2 View  Result Date: 09/20/2016 CLINICAL DATA:  Nausea chills and dry heaves EXAM: CHEST  2 VIEW COMPARISON:  06/14/2016 FINDINGS: Mildly low lung volumes. Extensive calcified pleural plaques. Small bilateral effusions. No focal consolidation. Stable moderate cardiomegaly. No pneumothorax. Atherosclerosis. Previous vertebral augmentation of the thoracolumbar spine. Moderate compression of a mid thoracic vertebra. IMPRESSION: 1. Small bilateral effusions.  No focal consolidation. 2. Stable moderate cardiomegaly without overt edema 3. Extensive calcified pleural plaques Electronically Signed   By: Donavan Foil M.D.   On: 09/20/2016 21:19   Dg Chest Port 1 View  Result Date: 09/26/2016 CLINICAL DATA:  Dyspnea. EXAM: PORTABLE CHEST 1 VIEW COMPARISON:  Radiographs of September 20, 2016. FINDINGS: Stable cardiomegaly. Atherosclerosis of thoracic aorta is noted. No pneumothorax is noted. Moderate bibasilar opacities are noted concerning for edema or atelectasis with associated pleural effusions. Calcified  pleural plaques are noted bilaterally consistent with asbestos exposure. Bony thorax is unremarkable. IMPRESSION: Aortic atherosclerosis. Stable cardiomegaly. Moderate bibasilar opacities are noted concerning for edema or atelectasis with associated pleural effusions. Electronically Signed   By: Marijo Conception, M.D.   On: 09/26/2016 11:47    Lab Data:  CBC:  Recent Labs Lab 09/21/16 0426 09/22/16 0418 09/23/16 0541 09/24/16 0823 09/25/16 0337 09/26/16 0357  WBC 20.7* 19.6* 18.4* 12.3* 10.3 10.2  NEUTROABS 19.2*  --   --   --   --   --  HGB 11.4* 10.7* 10.8* 10.6* 10.6* 11.0*  HCT 35.9* 33.8* 34.6* 32.6* 33.7* 35.0*  MCV 100.6* 101.5* 102.4* 100.9* 101.5* 102.3*  PLT 187 168 176 182 207 468   Basic Metabolic Panel:  Recent Labs Lab 09/20/16 2158  09/22/16 0418 09/23/16 0541 09/24/16 0823 09/25/16 0337 09/26/16 0357  NA  --   < > 139 138 137 141 140  K  --   < > 3.5 3.1* 2.9* 3.7 3.3*  CL  --   < > 103 102 99* 103 101  CO2  --   < > 26 30 30  32 33*  GLUCOSE  --   < > 122* 110* 97 102* 96  BUN  --   < > 13 13 9 8 7   CREATININE  --   < > 0.78 0.79 0.71 0.72 0.78  CALCIUM  --   < > 8.2* 8.2* 8.2* 8.4* 8.2*  MG 1.7  --   --   --  1.9 2.0 2.0  < > = values in this interval not displayed. GFR: Estimated Creatinine Clearance: 69.2 mL/min (by C-G formula based on SCr of 0.78 mg/dL). Liver Function Tests:  Recent Labs Lab 09/20/16 1948  AST 20  ALT 14*  ALKPHOS 56  BILITOT 0.9  PROT 5.8*  ALBUMIN 3.3*    Recent Labs Lab 09/20/16 1948  LIPASE 18   No results for input(s): AMMONIA in the last 168 hours. Coagulation Profile:  Recent Labs Lab 09/22/16 0418 09/23/16 0541 09/24/16 0540 09/25/16 0337 09/26/16 0357  INR 2.88 2.74 2.48 2.66 3.16   Cardiac Enzymes:  Recent Labs Lab 09/26/16 1012  TROPONINI 0.03*   BNP (last 3 results) No results for input(s): PROBNP in the last 8760 hours. HbA1C: No results for input(s): HGBA1C in the last 72  hours. CBG: No results for input(s): GLUCAP in the last 168 hours. Lipid Profile: No results for input(s): CHOL, HDL, LDLCALC, TRIG, CHOLHDL, LDLDIRECT in the last 72 hours. Thyroid Function Tests: No results for input(s): TSH, T4TOTAL, FREET4, T3FREE, THYROIDAB in the last 72 hours. Anemia Panel: No results for input(s): VITAMINB12, FOLATE, FERRITIN, TIBC, IRON, RETICCTPCT in the last 72 hours. Urine analysis:    Component Value Date/Time   COLORURINE YELLOW 09/20/2016 2024   APPEARANCEUR HAZY (A) 09/20/2016 2024   LABSPEC 1.015 09/20/2016 2024   PHURINE 6.0 09/20/2016 2024   GLUCOSEU NEGATIVE 09/20/2016 2024   HGBUR MODERATE (A) 09/20/2016 2024   BILIRUBINUR NEGATIVE 09/20/2016 [place] NEGATIVE 09/20/2016 2024   PROTEINUR NEGATIVE 09/20/2016 2024   NITRITE NEGATIVE 09/20/2016 2024   LEUKOCYTESUR NEGATIVE 09/20/2016 2024     Richard Bradley M.D. [facility] 09/26/2016, 12:22 PM  Pager: [phone] Between 7am to 7pm - call Pager - [phone]  After 7pm go to [email or web address] - password [email or web address]  Call night coverage person covering after 7pm

## 2016-09-26 NOTE — Progress Notes (Signed)
Patient is alert sleepy, HR -140s at rest, paged cardiologist gave orders for Metoprolol 25 mg po,   discomfort in penis area repositioned condom cath, gave pain medication , unable to wean 02 at this time.

## 2016-09-26 NOTE — Progress Notes (Addendum)
Pt is tolerating po medication and not desating as quickly as before HR is a still elevated Called Rapid for and 2n eye on the patient.

## 2016-09-26 NOTE — Progress Notes (Signed)
Troponin 0.03 no chest pain.heart rate elevated  Gave Beta blocker.

## 2016-09-26 NOTE — Progress Notes (Signed)
Pt is alert and orient with caregiver at the beside, was called in for SOB, checked 02 sats and pt was hypoxic low 80, on 3l of 02, increased to 5 l and a slight response up to 86%, Started on Non-rebreather and  called Rapid Response, Dr. Tana Coast and Cardiologist at the beside order IV Lasix, BNP, CXR, And continuous Pulse Ox, 02 sats 92-95. Plan to wean when stable, Strict output measurements for now.

## 2016-09-26 NOTE — Progress Notes (Signed)
Daily Progress Note   Patient Name: Richard Bradley       Date: 09/26/2016 DOB: 1925/11/26  Age: 83 y.o. MRN#: [id number] Attending Physician: Mendel Corning, MD Primary Care Physician: [name], MD Admit Date: 09/20/2016  Reason for Consultation/Follow-up: Non pain symptom management and Psychosocial/spiritual support  Subjective: Richard Bradley was awake and alert when I arrived at his bedside. He related that this morning he felt quite poor with increased work of breathing and fatigue. He now feels "great" and has "nothing to complaint about." He was in good spirits and was joking that the more white coats he sees, the better he feels.   Length of Stay: 6  Current Medications: Scheduled Meds:  . escitalopram  5 mg Oral QHS  . fluconazole (DIFLUCAN) IV  400 mg Intravenous Q24H  . fluticasone  1 spray Each Nare BID  . furosemide  40 mg Intravenous Daily  . mouth rinse  15 mL Mouth Rinse BID  . meropenem (MERREM) IV  2 g Intravenous Q8H  . metoprolol tartrate  25 mg Oral BID  . pantoprazole  40 mg Oral Daily  . polycarbophil  625 mg Oral TID  . polyethylene glycol  17 g Oral QHS  . predniSONE  10 mg Oral q morning - 10a  . Warfarin - Pharmacist Dosing Inpatient   Does not apply q1800    Continuous Infusions:   PRN Meds: acetaminophen, guaiFENesin, HYDROcodone-acetaminophen, ipratropium, ondansetron **OR** ondansetron (ZOFRAN) IV  Physical Exam  Constitutional: He is oriented to person, place, and time. He appears well-developed and well-nourished. No distress.  HENT:  Head: Normocephalic and atraumatic.  Mouth/Throat: No oropharyngeal exudate.  Eyes: EOM are normal.  Neck: Normal range of motion.  Cardiovascular: An irregularly irregular rhythm present. Tachycardia present.   Pulmonary/Chest: Effort normal and breath  sounds normal.  Abdominal: Soft. Normal appearance and bowel sounds are normal.  Musculoskeletal: Normal range of motion.  RLE similar to prior: edema noted in calf, slight erythema. LLE: wrapped with dressing CDI  Neurological: He is alert and oriented to person, place, and time.  Skin: Skin is warm and dry. There is pallor.  Psychiatric: He has a normal mood and affect. His behavior is normal. Judgment and thought content normal.            Vital Signs: BP 131/89   Pulse (!) 150   Temp 97.8 F (36.6 C) (Oral)   Resp 18   Ht _0  (1.905 m)   Wt 76.6 kg (168 lb 12.8 oz)   SpO2 92%   BMI 21.10 kg/m  SpO2: SpO2: 92 % O2 Device: O2 Device: Nasal Cannula, Simple Mask O2 Flow Rate: O2 Flow Rate (L/min): 14 L/min  Intake/output summary:  Intake/Output Summary (Last 24 hours) at 09/26/16 1440 Last data filed at 09/26/16 1300  Gross per 24 hour  Intake             1200 ml  Output             2500 ml  Net            -1300 ml   LBM: Last BM Date: 09/26/16 Baseline Weight: Weight: 76.8 kg (169  lb 4.8 oz) (bed scale) Most recent weight: Weight: 76.6 kg (168 lb 12.8 oz)  Palliative Assessment/Data: PPS 60%   Flowsheet Rows   Flowsheet Row Most Recent Value  Intake Tab  Referral Department  Hospitalist  Unit at Time of Referral  Cardiac/Telemetry Unit  Palliative Care Primary Diagnosis  Cancer  Date Notified  09/21/16  Palliative Care Type  New Palliative care  Reason for referral  Clarify Goals of Care  Date of Admission  09/20/16  Date first seen by Palliative Care  09/22/16  # of days Palliative referral response time  1 Day(s)  # of days IP prior to Palliative referral  1  Clinical Assessment  Psychosocial & Spiritual Assessment  Palliative Care Outcomes      Patient Active Problem List   Diagnosis Date Noted  . Goals of care, counseling/discussion   . Palliative care by specialist   . Sepsis due to Gram negative bacteria ([place]) 09/21/2016  . Gram-negative  bacteremia 09/21/2016  . Leukocytosis 09/21/2016  . Hypokalemia 09/21/2016  . Benign essential HTN 09/21/2016  . Amputated toe, left ([place]) 06/18/2016  . Anticoagulated on Coumadin 12/20/2013  . Myasthenia gravis ([place]) 06/27/2011  . Chronic atrial fibrillation [facility]     Palliative Care Assessment & Plan   HPI: 83 y.o. male  with past medical history of chronic A. Fib on coumadin, dCHF, Myasthenia Gravis, Prostate Cancer, Basal Cell cancer, recent left TMT amputation, urolithiasis, and Osteoarthritis who presented to the ED with nausea, dry heaves, and chills for one day. He was febrile on presentation with lactic acid 1.98 and WBC 20.7. He was subsequently admitted on 09/20/2016 with sepsis. Blood cultures returned with Pseudomonas aeruginosa and then later Candida albicans. ID following. ECHO with significant changes, TEE pending. Palliative initially consulted for goals of care.   Assessment: I met Richard Bradley and his daughter Richard Bradley, who is his HCPOA) on 2/25 for my initial consult. Nr, Burnley was not able to fully participate in our conversation due to lethargy, but his daughter was very clear on goals of care and expectations:  In terms of goals of care, Richard Bradley was very realistic that her father has multiple medical issues and is 83 years old. This is, in fact, his fourth episode of sepsis.  Prior to this hospitalization he was living with her and receiving support for bathing and dressing (non weight bearing on LLE due to surgery and healing wound), but was otherwise independent, doing his own taxes, and utilizing a wheelchair to move about his space. She is hopeful that he will improve with treatment back to that level of functioning (both mental and physical). She does, however, understand his baseline function may be worse given a systemic infection and the effect it can have on someone his age. In the event that he deteriorates-now or later-she does not want heroic measures that would  prolong his suffering. To that end, we discussed code status and she wants a rapid response called and ACLS medications administered("anything through the IV"), but does not want BiPAP, CPR, defibrillation, or intubation.   Since my first meeting, Richard Bradley has been followed by ID and plan for antibiotics in place. TEE still pending, as it was delayed today by a Rapid Response for acute respiratory failure. Cardiology was consulted and felt etiology was likely volume overload. TEE will now function to evaluate for infectious nidus and to further evaluate changes noted on recent ECHO. Symptomatically, he is feeling much better since re-starting diuresis this morning.  He has no acute complaints and tells me that he feels better compared to when I saw him last week.  Recommendations/Plan:  Partial code: Yes-rapid response and ACLS medication; No-BiPAP, CPR, defibrillation, intubation  Continue to treat what is treatable  Goals of Care and Additional Recommendations:  Limitations on Scope of Treatment: Full Scope Treatment  Code Status:  Limited code  Prognosis:   Unable to determine  Discharge Planning:  Home with [place] was discussed with pt and caregiver at bedside.  Thank you for allowing the Palliative Medicine Team to assist in the care of this patient.  Total time: 25 minutes    Greater than 50%  of this time was spent counseling and coordinating care related to the above assessment and plan.  Charlynn Court, NP Palliative Medicine Team [phone] pager (7a-5p) Team Phone # [phone]

## 2016-09-26 NOTE — Progress Notes (Signed)
[facility] for Infectious [facility]   Reason for visit: Follow up on Candida albicans in blood culture 1/2, Pseudomonas in blood Day 3 meropenem Day 3 Fluconazole Day 7 antibiotics  Interval History: acute sob today, rapid response called; repeat blood cultures remain negative. No fever.  TEE not done yet due to SOB.    Physical Exam: Constitutional:  Vitals:   09/26/16 1200 09/26/16 1334  BP:  131/89  Pulse: (!) 108 (!) 150  Resp:    Temp:     patient appears in NAD, on nrb mask Eyes: anicteric HENT: no thrush Respiratory: Normal respiratory effort on mask; CTA B Cardiovascular: RRR GI: soft, nt, nd  Review of Systems: Unable to be assessed due to patient factors  Lab Results  Component Value Date   WBC 10.2 09/26/2016   HGB 11.0 (L) 09/26/2016   HCT 35.0 (L) 09/26/2016   MCV 102.3 (H) 09/26/2016   PLT 217 09/26/2016    Lab Results  Component Value Date   CREATININE 0.78 09/26/2016   BUN 7 09/26/2016   NA 140 09/26/2016   K 3.3 (L) 09/26/2016   CL 101 09/26/2016   CO2 33 (H) 09/26/2016    Lab Results  Component Value Date   ALT 14 (L) 09/20/2016   AST 20 09/20/2016   ALKPHOS 56 09/20/2016     Microbiology: Recent Results (from the past 240 hour(s))  Blood culture (routine x 2)     Status: None   Collection Time: 09/20/16  9:45 PM  Result Value Ref Range Status   Specimen Description BLOOD RIGHT FOREARM  Final   Special Requests BOTTLES DRAWN AEROBIC AND ANAEROBIC 5ML  Final   Culture NO GROWTH 5 DAYS  Final   Report Status 09/25/2016 FINAL  Final  Blood culture (routine x 2)     Status: Abnormal   Collection Time: 09/20/16 10:05 PM  Result Value Ref Range Status   Specimen Description BLOOD RIGHT HAND  Final   Special Requests IN PEDIATRIC BOTTLE 2ML  Final   Culture  Setup Time   Final    GRAM NEGATIVE RODS IN PEDIATRIC BOTTLE CRITICAL RESULT CALLED TO, READ BACK BY AND VERIFIED WITH: A. JOHNSTON, PHARM, 09/21/16 AT 1733 BY J FUDESCO    Culture PSEUDOMONAS AERUGINOSA (A)  Final   Report Status 09/23/2016 FINAL  Final   Organism ID, Bacteria PSEUDOMONAS AERUGINOSA  Final      Susceptibility   Pseudomonas aeruginosa - MIC*    CEFTAZIDIME 16 INTERMEDIATE Intermediate     CIPROFLOXACIN 1 SENSITIVE Sensitive     GENTAMICIN <=1 SENSITIVE Sensitive     IMIPENEM 2 SENSITIVE Sensitive     PIP/TAZO 32 SENSITIVE Sensitive     CEFEPIME INTERMEDIATE Intermediate     * PSEUDOMONAS AERUGINOSA  Blood Culture ID Panel (Reflexed)     Status: Abnormal   Collection Time: 09/20/16 10:05 PM  Result Value Ref Range Status   Enterococcus species NOT DETECTED NOT DETECTED Final   Listeria monocytogenes NOT DETECTED NOT DETECTED Final   Staphylococcus species NOT DETECTED NOT DETECTED Final   Staphylococcus aureus NOT DETECTED NOT DETECTED Final   Streptococcus species NOT DETECTED NOT DETECTED Final   Streptococcus agalactiae NOT DETECTED NOT DETECTED Final   Streptococcus pneumoniae NOT DETECTED NOT DETECTED Final   Streptococcus pyogenes NOT DETECTED NOT DETECTED Final   Acinetobacter baumannii NOT DETECTED NOT DETECTED Final   Enterobacteriaceae species NOT DETECTED NOT DETECTED Final   Enterobacter cloacae complex NOT DETECTED  NOT DETECTED Final   Escherichia coli NOT DETECTED NOT DETECTED Final   Klebsiella oxytoca NOT DETECTED NOT DETECTED Final   Klebsiella pneumoniae NOT DETECTED NOT DETECTED Final   Proteus species NOT DETECTED NOT DETECTED Final   Serratia marcescens NOT DETECTED NOT DETECTED Final   Carbapenem resistance NOT DETECTED NOT DETECTED Final   Haemophilus influenzae NOT DETECTED NOT DETECTED Final   Neisseria meningitidis NOT DETECTED NOT DETECTED Final   Pseudomonas aeruginosa DETECTED (A) NOT DETECTED Final    Comment: CRITICAL RESULT CALLED TO, READ BACK BY AND VERIFIED WITH: A. JOHNSTON, PHARM, 09/21/16 AT 1733 BY J FUDESCO    Candida albicans NOT DETECTED NOT DETECTED Final   Candida glabrata NOT DETECTED NOT  DETECTED Final   Candida krusei NOT DETECTED NOT DETECTED Final   Candida parapsilosis NOT DETECTED NOT DETECTED Final   Candida tropicalis NOT DETECTED NOT DETECTED Final  Urine culture     Status: Abnormal   Collection Time: 09/21/16  3:08 PM  Result Value Ref Range Status   Specimen Description URINE, RANDOM  Final   Special Requests NONE  Final   Culture <10,000 COLONIES/mL INSIGNIFICANT GROWTH (A)  Final   Report Status 09/22/2016 FINAL  Final  Culture, blood (routine x 2)     Status: None (Preliminary result)   Collection Time: 09/22/16  4:28 AM  Result Value Ref Range Status   Specimen Description BLOOD RIGHT HAND  Final   Special Requests BOTTLES DRAWN AEROBIC ONLY 5CC  Final   Culture NO GROWTH 4 DAYS  Final   Report Status PENDING  Incomplete  Culture, blood (routine x 2)     Status: Abnormal   Collection Time: 09/22/16  4:34 AM  Result Value Ref Range Status   Specimen Description BLOOD LEFT HAND  Final   Special Requests BOTTLES DRAWN AEROBIC ONLY 5CC  Final   Culture  Setup Time   Final    YEAST AEROBIC BOTTLE ONLY CRITICAL RESULT CALLED TO, READ BACK BY AND VERIFIED WITH: PHARMD Karlene Einstein [id number] MLM    Culture CANDIDA ALBICANS (A)  Final   Report Status 09/25/2016 FINAL  Final  Blood Culture ID Panel (Reflexed)     Status: Abnormal   Collection Time: 09/22/16  4:34 AM  Result Value Ref Range Status   Enterococcus species NOT DETECTED NOT DETECTED Final   Listeria monocytogenes NOT DETECTED NOT DETECTED Final   Staphylococcus species NOT DETECTED NOT DETECTED Final   Staphylococcus aureus NOT DETECTED NOT DETECTED Final   Streptococcus species NOT DETECTED NOT DETECTED Final   Streptococcus agalactiae NOT DETECTED NOT DETECTED Final   Streptococcus pneumoniae NOT DETECTED NOT DETECTED Final   Streptococcus pyogenes NOT DETECTED NOT DETECTED Final   Acinetobacter baumannii NOT DETECTED NOT DETECTED Final   Enterobacteriaceae species NOT DETECTED NOT  DETECTED Final   Enterobacter cloacae complex NOT DETECTED NOT DETECTED Final   Escherichia coli NOT DETECTED NOT DETECTED Final   Klebsiella oxytoca NOT DETECTED NOT DETECTED Final   Klebsiella pneumoniae NOT DETECTED NOT DETECTED Final   Proteus species NOT DETECTED NOT DETECTED Final   Serratia marcescens NOT DETECTED NOT DETECTED Final   Haemophilus influenzae NOT DETECTED NOT DETECTED Final   Neisseria meningitidis NOT DETECTED NOT DETECTED Final   Pseudomonas aeruginosa NOT DETECTED NOT DETECTED Final   Candida albicans DETECTED (A) NOT DETECTED Final    Comment: CRITICAL RESULT CALLED TO, READ BACK BY AND VERIFIED WITH: Sandria Bales [id number] MLM  Candida glabrata NOT DETECTED NOT DETECTED Final   Candida krusei NOT DETECTED NOT DETECTED Final   Candida parapsilosis NOT DETECTED NOT DETECTED Final   Candida tropicalis NOT DETECTED NOT DETECTED Final  Culture, blood (routine x 2)     Status: None (Preliminary result)   Collection Time: 09/24/16  4:44 PM  Result Value Ref Range Status   Specimen Description BLOOD RIGHT HAND  Final   Special Requests IN PEDIATRIC BOTTLE 1ML  Final   Culture NO GROWTH 2 DAYS  Final   Report Status PENDING  Incomplete  Culture, blood (routine x 2)     Status: None (Preliminary result)   Collection Time: 09/24/16  5:03 PM  Result Value Ref Range Status   Specimen Description BLOOD LEFT HAND  Final   Special Requests IN PEDIATRIC BOTTLE 1CC  Final   Culture NO GROWTH 2 DAYS  Final   Report Status PENDING  Incomplete    Impression/Plan:  1. Pseudomonas bacteremia - repeat negative.  Unclear source.  Treatment for 2 weeks now day 6.  2. Candida albicans in blood - TEE tomorrow.  On fluconazole.  Will need eye exam within 7 days.  Can be done as outpatient if that can be arranged.

## 2016-09-27 DIAGNOSIS — R0602 Shortness of breath: Secondary | ICD-10-CM

## 2016-09-27 DIAGNOSIS — H538 Other visual disturbances: Secondary | ICD-10-CM

## 2016-09-27 DIAGNOSIS — Z9981 Dependence on supplemental oxygen: Secondary | ICD-10-CM

## 2016-09-27 DIAGNOSIS — L539 Erythematous condition, unspecified: Secondary | ICD-10-CM

## 2016-09-27 DIAGNOSIS — I952 Hypotension due to drugs: Secondary | ICD-10-CM

## 2016-09-27 DIAGNOSIS — I482 Chronic atrial fibrillation: Secondary | ICD-10-CM

## 2016-09-27 DIAGNOSIS — Z7901 Long term (current) use of anticoagulants: Secondary | ICD-10-CM

## 2016-09-27 DIAGNOSIS — I10 Essential (primary) hypertension: Secondary | ICD-10-CM

## 2016-09-27 DIAGNOSIS — Z5181 Encounter for therapeutic drug level monitoring: Secondary | ICD-10-CM

## 2016-09-27 LAB — BASIC METABOLIC PANEL
ANION GAP: 8 (ref 5–15)
BUN: 9 mg/dL (ref 6–20)
CALCIUM: 8.4 mg/dL — AB (ref 8.9–10.3)
CO2: 35 mmol/L — AB (ref 22–32)
Chloride: 99 mmol/L — ABNORMAL LOW (ref 101–111)
Creatinine, Ser: 0.83 mg/dL (ref 0.61–1.24)
GFR calc Af Amer: >60 mL/min (ref >60)
GLUCOSE: 140 mg/dL — AB (ref 65–99)
Potassium: 4.1 mmol/L (ref 3.5–5.1)
Sodium: 142 mmol/L (ref 135–145)

## 2016-09-27 LAB — PROTIME-INR
INR: 3.6
Prothrombin Time: 36.8 seconds — ABNORMAL HIGH (ref 11.4–15.2)

## 2016-09-27 LAB — CULTURE, BLOOD (ROUTINE X 2): CULTURE: NO GROWTH

## 2016-09-27 LAB — CBC
HEMATOCRIT: 34.3 % — AB (ref 39.0–52.0)
HEMOGLOBIN: 10.7 g/dL — AB (ref 13.0–17.0)
MCH: 32 pg (ref 26.0–34.0)
MCHC: 31.2 g/dL (ref 30.0–36.0)
MCV: 102.7 fL — ABNORMAL HIGH (ref 78.0–100.0)
Platelets: 206 10*3/uL (ref 150–400)
RBC: 3.34 MIL/uL — ABNORMAL LOW (ref 4.22–5.81)
RDW: 14.4 % (ref 11.5–15.5)
WBC: 10.3 10*3/uL (ref 4.0–10.5)

## 2016-09-27 LAB — TROPONIN I

## 2016-09-27 LAB — MAGNESIUM: MAGNESIUM: 1.9 mg/dL (ref 1.7–2.4)

## 2016-09-27 MED ORDER — FUROSEMIDE 10 MG/ML IJ SOLN
40.0000 mg | Freq: Two times a day (BID) | INTRAMUSCULAR | Status: AC
  Administered 2016-09-27 (×2): 40 mg via INTRAVENOUS
  Filled 2016-09-27 (×2): qty 4

## 2016-09-27 MED ORDER — DIGOXIN 125 MCG PO TABS
0.0625 mg | ORAL_TABLET | Freq: Every day | ORAL | Status: DC
  Administered 2016-09-28 – 2016-10-02 (×5): 0.0625 mg via ORAL
  Filled 2016-09-27 (×7): qty 1

## 2016-09-27 MED ORDER — DIGOXIN 0.25 MG/ML IJ SOLN
0.1250 mg | Freq: Two times a day (BID) | INTRAMUSCULAR | Status: AC
  Administered 2016-09-27 (×2): 0.125 mg via INTRAVENOUS
  Filled 2016-09-27: qty 2

## 2016-09-27 MED ORDER — HYDRALAZINE HCL 10 MG PO TABS
5.0000 mg | ORAL_TABLET | Freq: Four times a day (QID) | ORAL | Status: DC
  Administered 2016-09-27 – 2016-09-30 (×13): 5 mg via ORAL
  Filled 2016-09-27 (×13): qty 1

## 2016-09-27 NOTE — Progress Notes (Signed)
Evaluated the patient in anticipation for TEE. He is still requiring high O2 concentration and is unable to lie flat. CXR with large bilateral pleural effusions and pulmonary edema. While he has improved from yesterday, his respiratory status is still too tenuous for sedation.  Will postpone TEE, especially since confirming a diagnosis of endocarditis will only change the duration of treatment, not the immediate choice of treatment. Sanda Klein, MD, [facility] [phone] office [phone] pager

## 2016-09-27 NOTE — Progress Notes (Signed)
Patient has been NPO after midnight. Slept mostly during the night. Stated that he feels good this morning. Vitals signs stable. Pulse fluctuated from 95 - 125 bpm during the night. Will continue to monitor.  Daleyssa Loiselle, RN

## 2016-09-27 NOTE — Progress Notes (Signed)
[facility]                                                                              Patient Demographics  Richard Bradley, is a 83 y.o. male, DOB - 02-11-1926, [id number]  Admit date - 09/20/2016   Admitting Physician [name], MD  Outpatient Primary MD for the patient is [name], MD  Outpatient specialists:   LOS - 7  days    Chief Complaint  Patient presents with  . Nausea  . Fever  . flu like symptoms       Brief summary   83 y.o.malewith medical history significant for chronic atrial fibrillation on AC with coumadin, myasthenia gravis, prostate cancer, basal cell CA, recently underwent a left TMTamputation and is coming to the emergency department with nausea, dry heaves and chills since the morning of the admission.  On admission. BP was 90/48 and has improved with IV fluids to 159/109. HR was 121, RR 18-33, T max was 101.55F, oxygen saturation was 86% on room air but has improved to 95% with [place] oxygen support. Blood work was notable for WBC count 19.5, hemoglobin 12.1, potassium 2.8 which was supplemented. Lactic acid was 1.98. CXR showed small bilateral effusions but no focal consolidation. UA showed rare bacteria, no leukocytes. He was started on empiric broad spectrum abx until blood and urine cx results are back.  I assumed care on 09/26/16  Assessment & Plan    Principal problem Acute hypoxic respiratory failure/pulmonary edema and due to acute on chronic systolic CHF exacerbation - Likely due to volume overload, patient's Lasix was held through the hospitalization. Rapid response was called this morning due to shortness of breath, hypoxia with O2 sats low 80s, on 3 L eventually increased to NRB.  - cxr showed stable cardiomegaly with moderate bibasilar opacities concerning for edema moderate lactose is with associated pleural effusions. BNP 624, troponin 0.03 - Stat EKG showed atrial fibrillation with RVR 130s - Continue IV  diuresis, cardiology following.  - 2-D echo 2/27 showed EF of 35-40% with diffuse hypokinesis, massive bilateral enlargement, reduced RV systolic function, moderate TR, findings consistent with dilated cardiomyopathy   Active Problems:   Sepsis due to pseudomonas bacteremia and Candida Albicans now, source unclear at this time, RLE cellulitis  - Sepsis criteria met on admission with fever, tachycardia, tachypnea, leukocytosis, lactic acidosis - Blood cx on admission was positive for Pseudomonas.   -Initially started on vancomycin and Zosyn, ID was consulted, recommended meropenem. - Repeat blood cultures 2/25 showed Candida albicans. Started on Diflucan IV. Source unclear - 2-D echo showed EF 35-40%, no vegetations. - TEE currently on hold until respiratory status to more stable    RLE cellulitis/Left transmetatarsal amputation on 09/11/16 - current ABX should be adequate in coverage  - LE dopplers negative for DVT - seen by Dr Sharol Given- he can follow up in 2 weeks in his clinic.     Chronic atrial fibrillation (HCC) with RVR - CHADS vasc score 4 - On Anticoagulation with coumadin - Continue rate control with metoprolol - Cardiology consulted    Myasthenia gravis ([place]) - On chronic steroids  Amputated toe, left (HCC) - Stable     Hypokalemia - Replaced    Benign essential HTN - Continue metoprolol    Code Status: Partial code, no intubation DVT Prophylaxis:  Coumadin Family Communication: Discussed in detail with the patient, all imaging results, lab results explained to the patient, detailed report to the caregiver   Disposition Plan:   Time Spent in minutes  25 minutes  Procedures:  echo  Consultants:   Cardiology  ID   Anti microbials:   Vanco and zosyn 09/20/2016   Now on Meropenem and DiFlucan   Medications  Scheduled Meds: . digoxin  0.125 mg Intravenous Q12H  . [START ON 09/28/2016] digoxin  0.0625 mg Oral Daily  . escitalopram  5 mg Oral QHS    . feeding supplement (ENSURE ENLIVE)  237 mL Oral BID BM  . fluconazole (DIFLUCAN) IV  400 mg Intravenous Q24H  . fluticasone  1 spray Each Nare BID  . furosemide  40 mg Intravenous Q12H  . hydrALAZINE  5 mg Oral Q6H  . mouth rinse  15 mL Mouth Rinse BID  . meropenem (MERREM) IV  2 g Intravenous Q8H  . metoprolol tartrate  25 mg Oral BID  . pantoprazole  40 mg Oral Daily  . polycarbophil  625 mg Oral TID  . polyethylene glycol  17 g Oral QHS  . predniSONE  10 mg Oral q morning - 10a  . Warfarin - Pharmacist Dosing Inpatient   Does not apply q1800   Continuous Infusions: PRN Meds:.acetaminophen, guaiFENesin, HYDROcodone-acetaminophen, ipratropium, ondansetron **OR** ondansetron (ZOFRAN) IV   Antibiotics   Anti-infectives    Start     Dose/Rate Route Frequency Ordered Stop   09/25/16 0900  fluconazole (DIFLUCAN) IVPB 400 mg     400 mg 100 mL/hr over 120 Minutes Intravenous Every 24 hours 09/24/16 0832     09/24/16 0900  anidulafungin (ERAXIS) 200 mg in sodium chloride 0.9 % 200 mL IVPB  Status:  Discontinued     200 mg over 180 Minutes Intravenous Every 24 hours 09/24/16 0817 09/24/16 0832   09/24/16 0900  meropenem (MERREM) 2 g in sodium chloride 0.9 % 100 mL IVPB     2 g 200 mL/hr over 30 Minutes Intravenous Every 8 hours 09/24/16 0827     09/24/16 0900  fluconazole (DIFLUCAN) IVPB 800 mg     800 mg 200 mL/hr over 120 Minutes Intravenous  Once 09/24/16 0832 09/24/16 1504   09/23/16 1800  ciprofloxacin (CIPRO) tablet 750 mg  Status:  Discontinued     750 mg Oral 2 times daily 09/23/16 1330 09/24/16 0816   09/22/16 2300  vancomycin (VANCOCIN) 1,250 mg in sodium chloride 0.9 % 250 mL IVPB  Status:  Discontinued     1,250 mg 166.7 mL/hr over 90 Minutes Intravenous Every 12 hours 09/22/16 1227 09/23/16 0851   09/21/16 0800  piperacillin-tazobactam (ZOSYN) IVPB 3.375 g  Status:  Discontinued     3.375 g 12.5 mL/hr over 240 Minutes Intravenous Every 8 hours 09/21/16 0733 09/23/16  1330   09/21/16 0730  piperacillin-tazobactam (ZOSYN) IVPB 3.375 g  Status:  Discontinued     3.375 g 100 mL/hr over 30 Minutes Intravenous Every 8 hours 09/21/16 0724 09/21/16 0730   09/20/16 2300  vancomycin (VANCOCIN) IVPB 1000 mg/200 mL premix  Status:  Discontinued     1,000 mg 200 mL/hr over 60 Minutes Intravenous Every 12 hours 09/20/16 2247 09/22/16 1227   09/20/16 2145  cefTRIAXone (ROCEPHIN) 1 g  in dextrose 5 % 50 mL IVPB  Status:  Discontinued     1 g 100 mL/hr over 30 Minutes Intravenous  Once 09/20/16 2130 09/20/16 2132   09/20/16 2145  piperacillin-tazobactam (ZOSYN) IVPB 3.375 g     3.375 g 100 mL/hr over 30 Minutes Intravenous  Once 09/20/16 2132 09/20/16 2318        Subjective:   Croy Drumwright was seen and examined today. Feeling somewhat better today however still on O2 5 L via nasal cannula. Still somewhat short of breath, but able to speak full sentences, no chest pain. Much more alert and oriented today. Denies any nausea or vomiting, no fevers or chills.   Patient denies dizziness, abdominal pain, N/V/D/C, new weakness, numbess, tingling. No acute events overnight.    Objective:   Vitals:   09/26/16 2117 09/27/16 0500 09/27/16 1039 09/27/16 1251  BP: 113/84 116/90 103/83 99/75  Pulse: 99 (!) 119 (!) 142   Resp: 18 18    Temp: 97.3 F (36.3 C) 98.1 F (36.7 C)    TempSrc: Oral Oral    SpO2: 95% 96%    Weight:  78.1 kg (172 lb 1.6 oz)    Height:        Intake/Output Summary (Last 24 hours) at 09/27/16 1255 Last data filed at 09/27/16 1207  Gross per 24 hour  Intake              340 ml  Output             2851 ml  Net            -2511 ml     Wt Readings from Last 3 Encounters:  09/27/16 78.1 kg (172 lb 1.6 oz)  09/11/16 81.6 kg (180 lb)  08/12/16 81.2 kg (179 lb)     Exam  General: Alert and oriented x 3, NAD  HEENT:    Neck: Supple, no JVD, no masses  Cardiovascular: S1 S2 auscultated, Irregularly irregular, tachycardia   Respiratory:  Bibasilar crackles  Gastrointestinal: Soft, nontender, nondistended, + bowel sounds  Ext: no cyanosis clubbing. RLE 1+ edema, dressing intact on the left lower extremity, and TMT amputation   Neuro: AAOx3, Cr N's II- XII. Strength 5/5 upper and lower extremities bilaterally  Skin: No rashes  Psych: Normal affect and demeanor, alert and oriented x3    Data Reviewed:  I have personally reviewed following labs and imaging studies  Micro Results Recent Results (from the past 240 hour(s))  Blood culture (routine x 2)     Status: None   Collection Time: 09/20/16  9:45 PM  Result Value Ref Range Status   Specimen Description BLOOD RIGHT FOREARM  Final   Special Requests BOTTLES DRAWN AEROBIC AND ANAEROBIC 5ML  Final   Culture NO GROWTH 5 DAYS  Final   Report Status 09/25/2016 FINAL  Final  Blood culture (routine x 2)     Status: Abnormal   Collection Time: 09/20/16 10:05 PM  Result Value Ref Range Status   Specimen Description BLOOD RIGHT HAND  Final   Special Requests IN PEDIATRIC BOTTLE 2ML  Final   Culture  Setup Time   Final    GRAM NEGATIVE RODS IN PEDIATRIC BOTTLE CRITICAL RESULT CALLED TO, READ BACK BY AND VERIFIED WITH: A. JOHNSTON, PHARM, 09/21/16 AT 1733 BY J FUDESCO    Culture PSEUDOMONAS AERUGINOSA (A)  Final   Report Status 09/23/2016 FINAL  Final   Organism ID, Bacteria PSEUDOMONAS AERUGINOSA  Final  Susceptibility   Pseudomonas aeruginosa - MIC*    CEFTAZIDIME 16 INTERMEDIATE Intermediate     CIPROFLOXACIN 1 SENSITIVE Sensitive     GENTAMICIN <=1 SENSITIVE Sensitive     IMIPENEM 2 SENSITIVE Sensitive     PIP/TAZO 32 SENSITIVE Sensitive     CEFEPIME INTERMEDIATE Intermediate     * PSEUDOMONAS AERUGINOSA  Blood Culture ID Panel (Reflexed)     Status: Abnormal   Collection Time: 09/20/16 10:05 PM  Result Value Ref Range Status   Enterococcus species NOT DETECTED NOT DETECTED Final   Listeria monocytogenes NOT DETECTED NOT DETECTED Final   Staphylococcus  species NOT DETECTED NOT DETECTED Final   Staphylococcus aureus NOT DETECTED NOT DETECTED Final   Streptococcus species NOT DETECTED NOT DETECTED Final   Streptococcus agalactiae NOT DETECTED NOT DETECTED Final   Streptococcus pneumoniae NOT DETECTED NOT DETECTED Final   Streptococcus pyogenes NOT DETECTED NOT DETECTED Final   Acinetobacter baumannii NOT DETECTED NOT DETECTED Final   Enterobacteriaceae species NOT DETECTED NOT DETECTED Final   Enterobacter cloacae complex NOT DETECTED NOT DETECTED Final   Escherichia coli NOT DETECTED NOT DETECTED Final   Klebsiella oxytoca NOT DETECTED NOT DETECTED Final   Klebsiella pneumoniae NOT DETECTED NOT DETECTED Final   Proteus species NOT DETECTED NOT DETECTED Final   Serratia marcescens NOT DETECTED NOT DETECTED Final   Carbapenem resistance NOT DETECTED NOT DETECTED Final   Haemophilus influenzae NOT DETECTED NOT DETECTED Final   Neisseria meningitidis NOT DETECTED NOT DETECTED Final   Pseudomonas aeruginosa DETECTED (A) NOT DETECTED Final    Comment: CRITICAL RESULT CALLED TO, READ BACK BY AND VERIFIED WITH: A. JOHNSTON, PHARM, 09/21/16 AT 1733 BY J FUDESCO    Candida albicans NOT DETECTED NOT DETECTED Final   Candida glabrata NOT DETECTED NOT DETECTED Final   Candida krusei NOT DETECTED NOT DETECTED Final   Candida parapsilosis NOT DETECTED NOT DETECTED Final   Candida tropicalis NOT DETECTED NOT DETECTED Final  Urine culture     Status: Abnormal   Collection Time: 09/21/16  3:08 PM  Result Value Ref Range Status   Specimen Description URINE, RANDOM  Final   Special Requests NONE  Final   Culture <10,000 COLONIES/mL INSIGNIFICANT GROWTH (A)  Final   Report Status 09/22/2016 FINAL  Final  Culture, blood (routine x 2)     Status: None (Preliminary result)   Collection Time: 09/22/16  4:28 AM  Result Value Ref Range Status   Specimen Description BLOOD RIGHT HAND  Final   Special Requests BOTTLES DRAWN AEROBIC ONLY 5CC  Final   Culture  NO GROWTH 4 DAYS  Final   Report Status PENDING  Incomplete  Culture, blood (routine x 2)     Status: Abnormal   Collection Time: 09/22/16  4:34 AM  Result Value Ref Range Status   Specimen Description BLOOD LEFT HAND  Final   Special Requests BOTTLES DRAWN AEROBIC ONLY 5CC  Final   Culture  Setup Time   Final    YEAST AEROBIC BOTTLE ONLY CRITICAL RESULT CALLED TO, READ BACK BY AND VERIFIED WITH: Sandria Bales [id number] MLM    Culture CANDIDA ALBICANS (A)  Final   Report Status 09/25/2016 FINAL  Final  Blood Culture ID Panel (Reflexed)     Status: Abnormal   Collection Time: 09/22/16  4:34 AM  Result Value Ref Range Status   Enterococcus species NOT DETECTED NOT DETECTED Final   Listeria monocytogenes NOT DETECTED NOT DETECTED Final   Staphylococcus species  NOT DETECTED NOT DETECTED Final   Staphylococcus aureus NOT DETECTED NOT DETECTED Final   Streptococcus species NOT DETECTED NOT DETECTED Final   Streptococcus agalactiae NOT DETECTED NOT DETECTED Final   Streptococcus pneumoniae NOT DETECTED NOT DETECTED Final   Streptococcus pyogenes NOT DETECTED NOT DETECTED Final   Acinetobacter baumannii NOT DETECTED NOT DETECTED Final   Enterobacteriaceae species NOT DETECTED NOT DETECTED Final   Enterobacter cloacae complex NOT DETECTED NOT DETECTED Final   Escherichia coli NOT DETECTED NOT DETECTED Final   Klebsiella oxytoca NOT DETECTED NOT DETECTED Final   Klebsiella pneumoniae NOT DETECTED NOT DETECTED Final   Proteus species NOT DETECTED NOT DETECTED Final   Serratia marcescens NOT DETECTED NOT DETECTED Final   Haemophilus influenzae NOT DETECTED NOT DETECTED Final   Neisseria meningitidis NOT DETECTED NOT DETECTED Final   Pseudomonas aeruginosa NOT DETECTED NOT DETECTED Final   Candida albicans DETECTED (A) NOT DETECTED Final    Comment: CRITICAL RESULT CALLED TO, READ BACK BY AND VERIFIED WITH: PHARMD N BATCHELDER [id number] MLM    Candida glabrata NOT DETECTED NOT  DETECTED Final   Candida krusei NOT DETECTED NOT DETECTED Final   Candida parapsilosis NOT DETECTED NOT DETECTED Final   Candida tropicalis NOT DETECTED NOT DETECTED Final  Culture, blood (routine x 2)     Status: None (Preliminary result)   Collection Time: 09/24/16  4:44 PM  Result Value Ref Range Status   Specimen Description BLOOD RIGHT HAND  Final   Special Requests IN PEDIATRIC BOTTLE 1ML  Final   Culture NO GROWTH 2 DAYS  Final   Report Status PENDING  Incomplete  Culture, blood (routine x 2)     Status: None (Preliminary result)   Collection Time: 09/24/16  5:03 PM  Result Value Ref Range Status   Specimen Description BLOOD LEFT HAND  Final   Special Requests IN PEDIATRIC BOTTLE 1CC  Final   Culture NO GROWTH 2 DAYS  Final   Report Status PENDING  Incomplete    Radiology Reports Dg Chest 2 View  Result Date: 09/20/2016 CLINICAL DATA:  Nausea chills and dry heaves EXAM: CHEST  2 VIEW COMPARISON:  06/14/2016 FINDINGS: Mildly low lung volumes. Extensive calcified pleural plaques. Small bilateral effusions. No focal consolidation. Stable moderate cardiomegaly. No pneumothorax. Atherosclerosis. Previous vertebral augmentation of the thoracolumbar spine. Moderate compression of a mid thoracic vertebra. IMPRESSION: 1. Small bilateral effusions.  No focal consolidation. 2. Stable moderate cardiomegaly without overt edema 3. Extensive calcified pleural plaques Electronically Signed   By: Donavan Foil M.D.   On: 09/20/2016 21:19   Dg Chest Port 1 View  Result Date: 09/26/2016 CLINICAL DATA:  Dyspnea. EXAM: PORTABLE CHEST 1 VIEW COMPARISON:  Radiographs of September 20, 2016. FINDINGS: Stable cardiomegaly. Atherosclerosis of thoracic aorta is noted. No pneumothorax is noted. Moderate bibasilar opacities are noted concerning for edema or atelectasis with associated pleural effusions. 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 Bony thorax is unremarkable.  IMPRESSION: Aortic atherosclerosis. Stable cardiomegaly. Moderate bibasilar opacities are noted concerning for edema or atelectasis with associated pleural effusions. Electronically Signed   By: Marijo Conception, M.D.   On: 09/26/2016 11:47    Lab Data:  CBC:  Recent Labs Lab 09/21/16 0426  09/23/16 0541 09/24/16 0823 09/25/16 0337 09/26/16 0357 09/27/16 0019  WBC 20.7*  < > 18.4* 12.3* 10.3 10.2 10.3  NEUTROABS 19.2*  --   --   --   --   --   --  HGB 11.4*  < > 10.8* 10.6* 10.6* 11.0* 10.7*  HCT 35.9*  < > 34.6* 32.6* 33.7* 35.0* 34.3*  MCV 100.6*  < > 102.4* 100.9* 101.5* 102.3* 102.7*  PLT 187  < > 176 182 207 217 206  < > = values in this interval not displayed. Basic Metabolic Panel:  Recent Labs Lab 09/20/16 2158  09/23/16 0541 09/24/16 0823 09/25/16 0337 09/26/16 0357 09/27/16 0019  NA  --   < > 138 137 141 140 142  K  --   < > 3.1* 2.9* 3.7 3.3* 4.1  CL  --   < > 102 99* 103 101 99*  CO2  --   < > 30 30 32 33* 35*  GLUCOSE  --   < > 110* 97 102* 96 140*  BUN  --   < > _0 CREATININE  --   < > 0.79 0.71 0.72 0.78 0.83  CALCIUM  --   < > 8.2* 8.2* 8.4* 8.2* 8.4*  MG 1.7  --   --  1.9 2.0 2.0 1.9  < > = values in this interval not displayed. GFR: Estimated Creatinine Clearance: 68 mL/min (by C-G formula based on SCr of 0.83 mg/dL). Liver Function Tests:  Recent Labs Lab 09/20/16 1948  AST 20  ALT 14*  ALKPHOS 56  BILITOT 0.9  PROT 5.8*  ALBUMIN 3.3*    Recent Labs Lab 09/20/16 1948  LIPASE 18   No results for input(s): AMMONIA in the last 168 hours. Coagulation Profile:  Recent Labs Lab 09/23/16 0541 09/24/16 0540 09/25/16 0337 09/26/16 0357 09/27/16 0019  INR 2.74 2.48 2.66 3.16 3.60   Cardiac Enzymes:  Recent Labs Lab 09/26/16 1012 09/26/16 1245 09/26/16 1852 09/27/16 0019  TROPONINI 0.03* 0.03* 0.09* <0.03   BNP (last 3 results) No results for input(s): PROBNP in the last 8760 hours. HbA1C: No results for input(s):  HGBA1C in the last 72 hours. CBG: No results for input(s): GLUCAP in the last 168 hours. Lipid Profile: No results for input(s): CHOL, HDL, LDLCALC, TRIG, CHOLHDL, LDLDIRECT in the last 72 hours. Thyroid Function Tests: No results for input(s): TSH, T4TOTAL, FREET4, T3FREE, THYROIDAB in the last 72 hours. Anemia Panel: No results for input(s): VITAMINB12, FOLATE, FERRITIN, TIBC, IRON, RETICCTPCT in the last 72 hours. Urine analysis:    Component Value Date/Time   COLORURINE YELLOW 09/20/2016 2024   APPEARANCEUR HAZY (A) 09/20/2016 2024   LABSPEC 1.015 09/20/2016 2024   PHURINE 6.0 09/20/2016 2024   GLUCOSEU NEGATIVE 09/20/2016 2024   HGBUR MODERATE (A) 09/20/2016 2024   BILIRUBINUR NEGATIVE 09/20/2016 [place] NEGATIVE 09/20/2016 2024   PROTEINUR NEGATIVE 09/20/2016 2024   NITRITE NEGATIVE 09/20/2016 2024   LEUKOCYTESUR NEGATIVE 09/20/2016 2024     Chika Cichowski M.D. [facility] 09/27/2016, 12:55 PM  Pager: [phone] Between 7am to 7pm - call Pager - [phone]  After 7pm go to [email or web address] - password [email or web address]  Call night coverage person covering after 7pm

## 2016-09-27 NOTE — Care Management Important Message (Signed)
Important Message  Patient Details  Name: KEYMANI BETTEN MRN: [id number] Date of Birth: 11/08/1925   Medicare Important Message Given:  Yes    Nyomi Howser 09/27/2016, 3:04 PM

## 2016-09-27 NOTE — Progress Notes (Signed)
Notified by CCMD that patient had 7 beats of VT. Patient asymptomatic, resting. Will continue to monitor. No new orders at this time.   Elihue Ebert, RN

## 2016-09-27 NOTE — Progress Notes (Signed)
Physical Therapy Treatment Patient Details Name: Richard Bradley MRN: [id number] DOB: 05/17/1926 Today's Date: 09/27/2016    History of Present Illness 83 y.o. male  with past medical history of chronic A. Fib on coumadin, dCHF, Myasthenia Gravis, Prostate Cancer, Basal Cell cancer, recent left TMT amputation, urolithiasis, and Osteoarthritis who presented to the ED with nausea, dry heaves, and chills for one day admitted for sepsis.    PT Comments    Pt progressing with mobility during PT sessions. With encouragement the pt was willing to perform stand pivot transfer from bed to chair using rw. Cues for safety needed and repeated cues for NWB through LLE. Pt may be ready to begin advancing to ambulation during future sessions but will continue to need reinforcement regarding safety. The patients daughter was present for the session and in agreement with D/C to home following hospital stay.     Follow Up Recommendations  Home health PT;Supervision for mobility/OOB     Equipment Recommendations  None recommended by PT    Recommendations for Other Services       Precautions / Restrictions Precautions Precautions: Fall Restrictions Weight Bearing Restrictions: Yes LLE Weight Bearing: Non weight bearing    Mobility  Bed Mobility Overal bed mobility: Needs Assistance Bed Mobility: Supine to Sit     Supine to sit: Min guard     General bed mobility comments: using rail to assist, HOB elevated approx 20 degrees  Transfers Overall transfer level: Needs assistance Equipment used: Rolling walker (2 wheeled) Transfers: Sit to/from [facility] Sit to Stand: Min assist Stand pivot transfers: Min assist       General transfer comment: Cues provided for pt to maintain NWB through LLE.   Ambulation/Gait                 Stairs            Wheelchair Mobility    Modified Rankin (Stroke Patients Only)       Balance Overall balance assessment:  Needs assistance Sitting-balance support: No upper extremity supported Sitting balance-Leahy Scale: Good     Standing balance support: Bilateral upper extremity supported Standing balance-Leahy Scale: Poor Standing balance comment: using rw for support                    Cognition Arousal/Alertness: Awake/alert Behavior During Therapy: WFL for tasks assessed/performed Overall Cognitive Status: History of cognitive impairments - at baseline Area of Impairment: Safety/judgement               General Comments: Pt needing cues for weightbearing restrictions and waiting for setup prior to attempting transfer.     Exercises      General Comments        Pertinent Vitals/Pain Pain Assessment: No/denies pain    Home Living                      Prior Function            PT Goals (current goals can now be found in the care plan section) Acute Rehab PT Goals Patient Stated Goal: get home PT Goal Formulation: With family Time For Goal Achievement: 10/06/16 Potential to Achieve Goals: Good Progress towards PT goals: Progressing toward goals    Frequency    Min 3X/week      PT Plan Current plan remains appropriate    Co-evaluation             End of Session  Equipment Utilized During Treatment: Gait belt;Oxygen Activity Tolerance: Patient tolerated treatment well Patient left: in chair;with call bell/phone within reach;with chair alarm set;with family/visitor present Nurse Communication: Mobility status PT Visit Diagnosis: Muscle weakness (generalized) (M62.81);Difficulty in walking, not elsewhere classified (R26.2)     Time: [id number] PT Time Calculation (min) (ACUTE ONLY): 19 min  Charges:  $Therapeutic Activity: 8-22 mins                    G Codes:       Cassell Clement, PT, CSCS Pager [phone] Office [phone]  09/27/2016, 2:02 PM

## 2016-09-27 NOTE — Progress Notes (Signed)
Informed by Dr. Linard Millers that pt is not having procedure done today and can resume order.  Karie Kirks, Therapist, sports.

## 2016-09-27 NOTE — Progress Notes (Addendum)
Progress Note  Patient Name: Richard Bradley Date of Encounter: 09/27/2016  Primary Cardiologist: Linard Millers  Subjective   Breathing is improved compared to yesterday. TEE will be canceled today as the patient still has significant CHF.  Inpatient Medications    Scheduled Meds: . escitalopram  5 mg Oral QHS  . feeding supplement (ENSURE ENLIVE)  237 mL Oral BID BM  . fluconazole (DIFLUCAN) IV  400 mg Intravenous Q24H  . fluticasone  1 spray Each Nare BID  . furosemide  40 mg Intravenous Daily  . mouth rinse  15 mL Mouth Rinse BID  . meropenem (MERREM) IV  2 g Intravenous Q8H  . metoprolol tartrate  25 mg Oral BID  . pantoprazole  40 mg Oral Daily  . polycarbophil  625 mg Oral TID  . polyethylene glycol  17 g Oral QHS  . predniSONE  10 mg Oral q morning - 10a  . Warfarin - Pharmacist Dosing Inpatient   Does not apply q1800   Continuous Infusions:  PRN Meds: acetaminophen, guaiFENesin, HYDROcodone-acetaminophen, ipratropium, ondansetron **OR** ondansetron (ZOFRAN) IV   Vital Signs    Vitals:   09/26/16 1334 09/26/16 1600 09/26/16 2117 09/27/16 0500  BP: 131/89 114/72 113/84 116/90  Pulse: (!) 150 (!) 123 99 (!) 119  Resp: (!) 28 18 18 18   Temp:  98 F (36.7 C) 97.3 F (36.3 C) 98.1 F (36.7 C)  TempSrc:   Oral Oral  SpO2:  95% 95% 96%  Weight:    172 lb 1.6 oz (78.1 kg)  Height:        Intake/Output Summary (Last 24 hours) at 09/27/16 0921 Last data filed at 09/27/16 0600  Gross per 24 hour  Intake              540 ml  Output             2751 ml  Net            -2211 ml   Filed Weights   09/25/16 0606 09/26/16 0631 09/27/16 0500  Weight: 173 lb 6.4 oz (78.7 kg) 168 lb 12.8 oz (76.6 kg) 172 lb 1.6 oz (78.1 kg)    Telemetry    Atrial fibrillation with poor rate control - Personally Reviewed  ECG    Atrial fibrillation with rapid ventricular response. Premature ventricular contractions. No acute ischemic change. - Personally Reviewed  Physical Exam    Elderly, frail, and in mild distress. GEN: No acute distress.   Neck:  mild JVD Cardiac: IIRR, no murmurs, rubs, or gallops.  Respiratory:  diminished breath sounds diffusely. Rales heard throughout both mid lung fields.Marland Kitchen GI: Soft, nontender, non-distended  MS: No edema; No deformity. Neuro:  Nonfocal  Psych: Normal affect   Labs    Chemistry Recent Labs Lab 09/20/16 1948  09/25/16 0337 09/26/16 0357 09/27/16 0019  NA 140  < > 141 140 142  K 2.8*  < > 3.7 3.3* 4.1  CL 100*  < > 103 101 99*  CO2 28  < > 32 33* 35*  GLUCOSE 119*  < > 102* 96 140*  BUN 15  < > 8 7 9   CREATININE 0.78  < > 0.72 0.78 0.83  CALCIUM 8.9  < > 8.4* 8.2* 8.4*  PROT 5.8*  --   --   --   --   ALBUMIN 3.3*  --   --   --   --   AST 20  --   --   --   --  ALT 14*  --   --   --   --   ALKPHOS 56  --   --   --   --   BILITOT 0.9  --   --   --   --   GFRNONAA >60  < > >60 >60 >60  GFRAA >60  < > >60 >60 >60  ANIONGAP 12  < > 6 6 8   < > = values in this interval not displayed.   Hematology Recent Labs Lab 09/25/16 0337 09/26/16 0357 09/27/16 0019  WBC 10.3 10.2 10.3  RBC 3.32* 3.42* 3.34*  HGB 10.6* 11.0* 10.7*  HCT 33.7* 35.0* 34.3*  MCV 101.5* 102.3* 102.7*  MCH 31.9 32.2 32.0  MCHC 31.5 31.4 31.2  RDW 14.0 14.4 14.4  PLT 207 217 206    Cardiac Enzymes Recent Labs Lab 09/26/16 1012 09/26/16 1245 09/26/16 1852 09/27/16 0019  TROPONINI 0.03* 0.03* 0.09* <0.03   No results for input(s): TROPIPOC in the last 168 hours.   BNP Recent Labs Lab 09/26/16 1105  BNP 624.0*     DDimer No results for input(s): DDIMER in the last 168 hours.   Radiology    Dg Chest Port 1 View  Result Date: 09/26/2016 CLINICAL DATA:  Dyspnea. EXAM: PORTABLE CHEST 1 VIEW COMPARISON:  Radiographs of September 20, 2016. FINDINGS: Stable cardiomegaly. Atherosclerosis of thoracic aorta is noted. No pneumothorax is noted. Moderate bibasilar opacities are noted concerning for edema or atelectasis with  associated pleural effusions. 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 Bony thorax is unremarkable. IMPRESSION: Aortic atherosclerosis. Stable cardiomegaly. Moderate bibasilar opacities are noted concerning for edema or atelectasis with associated pleural effusions. Electronically Signed   By: Marijo Conception, M.D.   On: 09/26/2016 11:47   The chest x-ray was personally reviewed and looks terrible.- 09/27/16  Cardiac Studies   Echocardiogram, 09/24/26: Study Conclusions  - Left ventricle: The cavity size was severely dilated. Wall   thickness was normal. Systolic function was moderately reduced.   The estimated ejection fraction was in the range of 35% to 40%.   Diffuse hypokinesis. The study is not technically sufficient to   allow evaluation of LV diastolic function. - Aortic valve: Sclerosis without stenosis. There was moderate   regurgitation. - Mitral valve: Mildly thickened leaflets . There was severe   regurgitation directed posteriorly. - Left atrium: Massively dilated (115 ml/m2). - Right ventricle: The cavity size was mildly dilated. Reduced RV   systolic function. Lateral annulus peak S velocity: 7.62 cm/s. - Right atrium: Massively dilated (90 ml/m2). - Tricuspid valve: There was moderate regurgitation. - Pulmonary arteries: PA peak pressure: 52 mm Hg (S). - Inferior vena cava: The vessel was dilated. The respirophasic   diameter changes were blunted (< 50%), consistent with elevated   central venous pressure. - Pericardium, extracardiac: A trivial pericardial effusion was   identified posterior to the heart. There was a left pleural   effusion.  Recommendations:  LVEF 35-40%, severely dilated LV with global hypokinesis and normal wall thickness, moderate AI, severe posteriorly directed MR, massive biatrial enlargement, mild RVE with reduced RV systolic function, moderate TR, RVSP 52 mmHg, dilated IVC, trivial pericardial  effusion with left pleural effusion. Findings consistent with dilated cardiomyopathy.   Patient Profile     83 y.o. male with history of myasthenia gravis, chronic atrial fibrillation, chronic anticoagulation with Coumadin, essential hypertension, pulmonary fibrosis, chronic combined systolic and diastolic heart failure with prior cath greater than 5  years ago without obstructive disease, type 2 diabetes mellitus, peripheral vascular disease, and prior history of PVD with partial left lower extremity amputation. Admitted on this occasion with poly-organism bacteremia. Source of bacteremia has not been identified.  Assessment & Plan    1. Acute on chronic combined systolic and diastolic heart failure. Improvement in respiratory status overnight with diuresis. Continue diuresis over we can with goal to allow transesophageal echo early next week. We'll tentatively attempt to reschedule for Monday. Two doses of IV Lasix today. Reassess volume status in a.m. and give additional IV Lasix or convert to by mouth depending upon his clinical status/output/kidney function/etc.  2. Poly-organism bacteremia: Gram-negative ride and Candida. Plan TEE once heart failure resolved to exclude the possibility of endocarditis. TEE is set for Monday. New orders will need to be written on Sunday and patient made nothing by mouth if it appears he is stable enough to undergo the procedure.  3. Chronic atrial fibrillation, currently with poor rate control secondary to superimposed acute illness. Strength and rate control regimen as possible given soft blood pressures. Starting digoxin today to help with rate control.  4. Severe mitral regurgitation, attempt afterload reduction with hydralazine as tolerated by blood pressure.If he tolerates hydralazine, consider optimizing and eventually adding long-acting nitrates.  Overall very tenuous situation in this 83 year old gentleman with poly-organism bacteremia, left lower  extremity partial amputation, and acute CHF related to volume loading and diuretic with hold on admission. Depending upon course, may need to consider further discussions with family concerning CODE STATUS.  Signed, Sinclair Grooms, MD  09/27/2016, 9:21 AM

## 2016-09-27 NOTE — Progress Notes (Addendum)
Pharmacy Consult Note  Assessment: 83 yo male presenting with nausea, recent TMT amputation. Pt on warfarin PTA for afib.  Anticoag: INR still trending up this morning above goal at 3.6. CBC stable, no bleeding noted. Dopplers negative. Concern with INR trend given that she is on fluconazole so will continue to hold dose tonight and plan on resuming lower dose when stable.   DDI: Patient on fluconazole and prednisone.   PTA warfarin dose: 5 mg MWF, 2.5 mg all other days. Pt took last dose 2/23 PTA.  ID: [id number] abx for sepsis. Afeb, WBC 10. ID wanting TEE next wk when HF stable  Antimicrobials this admission: Zosyn 2/23>>2/26 Vanc  2/23>>2/26  Cipro 2/26>>2/27 Merrem 2/27>> (treat for 14d, 2/24 day 1) FCZ 2/27>>  Dose adjustments this admission: 2/25 VT = 12 - increase to 1250 q12h (may be conservative but 83 yo)  Microbiology results: 2/23 BCx x2: pseudomonas (I to cefepime/fortaz, s to FQ, primaxin, zosyn) 2/23 BC x1: ng 2/24 UCx: insig growth 2/25 BCx2: candida 2/25 BCx1: ngtd 2/27 BCx2: ngtd  Goal of Therapy:  INR 2-3 Monitor platelets by anticoagulation protocol: Yes   Plan:  Hold Warfarin tonight Daily INR, CBC Monitor PO intake, DDI, s/sx bleeding Check digoxin level next week with possible DI with fluconazole No changes in antibiotics today Follow culture data   Allergies  Allergen Reactions  . Tape Other (See Comments)    PATIENT'S SKIN IS VERY THIN AND WILL TEAR AND BRUISE VERY EASILY!!    Patient Measurements: Height: 6\' 3"  (190.5 cm) Weight: 172 lb 1.6 oz (78.1 kg) (scale b) IBW/kg (Calculated) : 84.5   Vital Signs: Temp: 98.1 F (36.7 C) (03/02 0500) Temp Source: Oral (03/02 0500) BP: 116/90 (03/02 0500) Pulse Rate: 119 (03/02 0500)  Labs:  Recent Labs  09/25/16 0337 09/26/16 0357  09/26/16 1245 09/26/16 1852 09/27/16 0019  HGB 10.6* 11.0*  --   --   --  10.7*  HCT 33.7* 35.0*  --   --   --  34.3*  PLT 207 217  --   --   --  206   LABPROT 28.8* 33.2*  --   --   --  36.8*  INR 2.66 3.16  --   --   --  3.60  CREATININE 0.72 0.78  --   --   --  0.83  TROPONINI  --   --   < > 0.03* 0.09* <0.03  < > = values in this interval not displayed.  Estimated Creatinine Clearance: 68 mL/min (by C-G formula based on SCr of 0.83 mg/dL).   Erin Hearing PharmD., BCPS Clinical Pharmacist Pager [phone] 09/27/2016 10:33 AM

## 2016-09-27 NOTE — Progress Notes (Signed)
Pt HR up in 151 this am. Pt asymptomatic.  VSS   Notified D. Dunn .  Instructed to continue to monitor.  Karie Kirks, Therapist, sports.  Karie Kirks, Therapist, sports.

## 2016-09-27 NOTE — Progress Notes (Signed)
[place] [facility]   Reason for visit: Follow up on Candida albicans in blood culture 1/2, Pseudomonas in blood Day 4 meropenem Day 4 Fluconazole Day 8 antibiotics  Interval History: SOB improved but still requiring oxygen and TEE postponed until next week.  No associated n/v/d. Repeat blood cultures ngtd.  WBC wnl   Physical Exam: Constitutional:  Vitals:   09/26/16 2117 09/27/16 0500  BP: 113/84 116/90  Pulse: 99 (!) 119  Resp: 18 18  Temp: 97.3 F (36.3 C) 98.1 F (36.7 C)   patient appears in NAD, on nasal cannula HENT: no thrush Respiratory: Normal respiratory effort on nasal cannula; CTA B Cardiovascular: RRR GI: soft, nt, nd Skin: right leg with erythema at area of gastroc fluid collection  Review of Systems: Constitutional: no fever, no chills GI: no diarrhea Skin: no rash  Lab Results  Component Value Date   WBC 10.3 09/27/2016   HGB 10.7 (L) 09/27/2016   HCT 34.3 (L) 09/27/2016   MCV 102.7 (H) 09/27/2016   PLT 206 09/27/2016    Lab Results  Component Value Date   CREATININE 0.83 09/27/2016   BUN 9 09/27/2016   NA 142 09/27/2016   K 4.1 09/27/2016   CL 99 (L) 09/27/2016   CO2 35 (H) 09/27/2016    Lab Results  Component Value Date   ALT 14 (L) 09/20/2016   AST 20 09/20/2016   ALKPHOS 56 09/20/2016     Microbiology: Recent Results (from the past 240 hour(s))  Blood culture (routine x 2)     Status: None   Collection Time: 09/20/16  9:45 PM  Result Value Ref Range Status   Specimen Description BLOOD RIGHT FOREARM  Final   Special Requests BOTTLES DRAWN AEROBIC AND ANAEROBIC 5ML  Final   Culture NO GROWTH 5 DAYS  Final   Report Status 09/25/2016 FINAL  Final  Blood culture (routine x 2)     Status: Abnormal   Collection Time: 09/20/16 10:05 PM  Result Value Ref Range Status   Specimen Description BLOOD RIGHT HAND  Final   Special Requests IN PEDIATRIC BOTTLE 2ML  Final   Culture  Setup Time   Final    GRAM NEGATIVE  RODS IN PEDIATRIC BOTTLE CRITICAL RESULT CALLED TO, READ BACK BY AND VERIFIED WITH: A. JOHNSTON, PHARM, 09/21/16 AT 1733 BY J FUDESCO    Culture PSEUDOMONAS AERUGINOSA (A)  Final   Report Status 09/23/2016 FINAL  Final   Organism ID, Bacteria PSEUDOMONAS AERUGINOSA  Final      Susceptibility   Pseudomonas aeruginosa - MIC*    CEFTAZIDIME 16 INTERMEDIATE Intermediate     CIPROFLOXACIN 1 SENSITIVE Sensitive     GENTAMICIN <=1 SENSITIVE Sensitive     IMIPENEM 2 SENSITIVE Sensitive     PIP/TAZO 32 SENSITIVE Sensitive     CEFEPIME INTERMEDIATE Intermediate     * PSEUDOMONAS AERUGINOSA  Blood Culture ID Panel (Reflexed)     Status: Abnormal   Collection Time: 09/20/16 10:05 PM  Result Value Ref Range Status   Enterococcus species NOT DETECTED NOT DETECTED Final   Listeria monocytogenes NOT DETECTED NOT DETECTED Final   Staphylococcus species NOT DETECTED NOT DETECTED Final   Staphylococcus aureus NOT DETECTED NOT DETECTED Final   Streptococcus species NOT DETECTED NOT DETECTED Final   Streptococcus agalactiae NOT DETECTED NOT DETECTED Final   Streptococcus pneumoniae NOT DETECTED NOT DETECTED Final   Streptococcus pyogenes NOT DETECTED NOT DETECTED Final   Acinetobacter baumannii NOT  DETECTED NOT DETECTED Final   Enterobacteriaceae species NOT DETECTED NOT DETECTED Final   Enterobacter cloacae complex NOT DETECTED NOT DETECTED Final   Escherichia coli NOT DETECTED NOT DETECTED Final   Klebsiella oxytoca NOT DETECTED NOT DETECTED Final   Klebsiella pneumoniae NOT DETECTED NOT DETECTED Final   Proteus species NOT DETECTED NOT DETECTED Final   Serratia marcescens NOT DETECTED NOT DETECTED Final   Carbapenem resistance NOT DETECTED NOT DETECTED Final   Haemophilus influenzae NOT DETECTED NOT DETECTED Final   Neisseria meningitidis NOT DETECTED NOT DETECTED Final   Pseudomonas aeruginosa DETECTED (A) NOT DETECTED Final    Comment: CRITICAL RESULT CALLED TO, READ BACK BY AND VERIFIED  WITH: A. JOHNSTON, PHARM, 09/21/16 AT 1733 BY J FUDESCO    Candida albicans NOT DETECTED NOT DETECTED Final   Candida glabrata NOT DETECTED NOT DETECTED Final   Candida krusei NOT DETECTED NOT DETECTED Final   Candida parapsilosis NOT DETECTED NOT DETECTED Final   Candida tropicalis NOT DETECTED NOT DETECTED Final  Urine culture     Status: Abnormal   Collection Time: 09/21/16  3:08 PM  Result Value Ref Range Status   Specimen Description URINE, RANDOM  Final   Special Requests NONE  Final   Culture <10,000 COLONIES/mL INSIGNIFICANT GROWTH (A)  Final   Report Status 09/22/2016 FINAL  Final  Culture, blood (routine x 2)     Status: None (Preliminary result)   Collection Time: 09/22/16  4:28 AM  Result Value Ref Range Status   Specimen Description BLOOD RIGHT HAND  Final   Special Requests BOTTLES DRAWN AEROBIC ONLY 5CC  Final   Culture NO GROWTH 4 DAYS  Final   Report Status PENDING  Incomplete  Culture, blood (routine x 2)     Status: Abnormal   Collection Time: 09/22/16  4:34 AM  Result Value Ref Range Status   Specimen Description BLOOD LEFT HAND  Final   Special Requests BOTTLES DRAWN AEROBIC ONLY 5CC  Final   Culture  Setup Time   Final    YEAST AEROBIC BOTTLE ONLY CRITICAL RESULT CALLED TO, READ BACK BY AND VERIFIED WITH: PHARMD Karlene Einstein [id number] MLM    Culture CANDIDA ALBICANS (A)  Final   Report Status 09/25/2016 FINAL  Final  Blood Culture ID Panel (Reflexed)     Status: Abnormal   Collection Time: 09/22/16  4:34 AM  Result Value Ref Range Status   Enterococcus species NOT DETECTED NOT DETECTED Final   Listeria monocytogenes NOT DETECTED NOT DETECTED Final   Staphylococcus species NOT DETECTED NOT DETECTED Final   Staphylococcus aureus NOT DETECTED NOT DETECTED Final   Streptococcus species NOT DETECTED NOT DETECTED Final   Streptococcus agalactiae NOT DETECTED NOT DETECTED Final   Streptococcus pneumoniae NOT DETECTED NOT DETECTED Final   Streptococcus  pyogenes NOT DETECTED NOT DETECTED Final   Acinetobacter baumannii NOT DETECTED NOT DETECTED Final   Enterobacteriaceae species NOT DETECTED NOT DETECTED Final   Enterobacter cloacae complex NOT DETECTED NOT DETECTED Final   Escherichia coli NOT DETECTED NOT DETECTED Final   Klebsiella oxytoca NOT DETECTED NOT DETECTED Final   Klebsiella pneumoniae NOT DETECTED NOT DETECTED Final   Proteus species NOT DETECTED NOT DETECTED Final   Serratia marcescens NOT DETECTED NOT DETECTED Final   Haemophilus influenzae NOT DETECTED NOT DETECTED Final   Neisseria meningitidis NOT DETECTED NOT DETECTED Final   Pseudomonas aeruginosa NOT DETECTED NOT DETECTED Final   Candida albicans DETECTED (A) NOT DETECTED Final  Comment: CRITICAL RESULT CALLED TO, READ BACK BY AND VERIFIED WITH: PHARMD Karlene Einstein [id number] MLM    Candida glabrata NOT DETECTED NOT DETECTED Final   Candida krusei NOT DETECTED NOT DETECTED Final   Candida parapsilosis NOT DETECTED NOT DETECTED Final   Candida tropicalis NOT DETECTED NOT DETECTED Final  Culture, blood (routine x 2)     Status: None (Preliminary result)   Collection Time: 09/24/16  4:44 PM  Result Value Ref Range Status   Specimen Description BLOOD RIGHT HAND  Final   Special Requests IN PEDIATRIC BOTTLE 1ML  Final   Culture NO GROWTH 2 DAYS  Final   Report Status PENDING  Incomplete  Culture, blood (routine x 2)     Status: None (Preliminary result)   Collection Time: 09/24/16  5:03 PM  Result Value Ref Range Status   Specimen Description BLOOD LEFT HAND  Final   Special Requests IN PEDIATRIC BOTTLE 1CC  Final   Culture NO GROWTH 2 DAYS  Final   Report Status PENDING  Incomplete    Impression/Plan:  1. Pseudomonas bacteremia - repeat negative.  Unclear source.  Treatment for 10-14 days, now day 7.  2. Candida albicans in blood - TEE next week.  On fluconazole.  Some blurry vision so will need outpatient eye exam within 7 days.  Fluconazole can be oral  with equivalent bioavailability so will not need picc line at discharge.  Duration dependent on TEE  If safe TEE remains a problem, we can opt to treat for 6 weeks with oral fluconazole, though with valve dysfunction, may be optimal to do TEE  3.  Leg erythema - not c/w cellulitis.  Does come and go and I suspect vascular related.    Dr. Baxter Flattery available over the weekend, otherwise I will follow up on Monday

## 2016-09-28 DIAGNOSIS — I340 Nonrheumatic mitral (valve) insufficiency: Secondary | ICD-10-CM

## 2016-09-28 LAB — BASIC METABOLIC PANEL
Anion gap: 11 (ref 5–15)
BUN: 11 mg/dL (ref 6–20)
CALCIUM: 8.6 mg/dL — AB (ref 8.9–10.3)
CO2: 37 mmol/L — ABNORMAL HIGH (ref 22–32)
Chloride: 93 mmol/L — ABNORMAL LOW (ref 101–111)
Creatinine, Ser: 0.81 mg/dL (ref 0.61–1.24)
GFR calc Af Amer: >60 mL/min (ref >60)
GLUCOSE: 94 mg/dL (ref 65–99)
POTASSIUM: 4 mmol/L (ref 3.5–5.1)
SODIUM: 141 mmol/L (ref 135–145)

## 2016-09-28 LAB — PROTIME-INR
INR: 3.05
PROTHROMBIN TIME: 32.2 s — AB (ref 11.4–15.2)

## 2016-09-28 MED ORDER — WARFARIN SODIUM 2 MG PO TABS
1.0000 mg | ORAL_TABLET | Freq: Once | ORAL | Status: DC
  Filled 2016-09-28: qty 0.5

## 2016-09-28 MED ORDER — WARFARIN SODIUM 1 MG PO TABS
1.0000 mg | ORAL_TABLET | Freq: Once | ORAL | Status: AC
  Administered 2016-09-28: 1 mg via ORAL
  Filled 2016-09-28: qty 1

## 2016-09-28 NOTE — Progress Notes (Signed)
Patient with no complaints or concerns during 7pm - 7am shift. PRN given for generalized pain and effective. HR still between 110-120's bpm. Will continue to monitor.  Shavonda Wiedman, RN

## 2016-09-28 NOTE — Progress Notes (Signed)
[facility]                                                                              Patient Demographics  Richard Bradley, is a 83 y.o. male, DOB - [birth year], BHA:[id number]  Admit date - 09/20/2016   Admitting Physician [name], MD  Outpatient Primary MD for the patient is [name], MD  Outpatient specialists:   LOS - 8  days    Chief Complaint  Patient presents with  . Nausea  . Fever  . flu like symptoms       Brief summary   83 y.o.malewith medical history significant for chronic atrial fibrillation on AC with coumadin, myasthenia gravis, prostate cancer, basal cell CA, recently underwent a left TMTamputation and is coming to the emergency department with nausea, dry heaves and chills since the morning of the admission.  On admission. BP was 90/48 and has improved with IV fluids to 159/109. HR was 121, RR 18-33, T max was 101.23F, oxygen saturation was 86% on room air but has improved to 95% with [place] oxygen support. Blood work was notable for WBC count 19.5, hemoglobin 12.1, potassium 2.8 which was supplemented. Lactic acid was 1.98. CXR showed small bilateral effusions but no focal consolidation. UA showed rare bacteria, no leukocytes. He was started on empiric broad spectrum abx until blood and urine cx results are back.  I assumed care on 09/26/16  Assessment & Plan    Principal problem Acute hypoxic respiratory failure/pulmonary edema and due to acute on chronic systolic CHF exacerbation - Likely due to volume overload, patient's Lasix was held through the hospitalization. Rapid response was called this morning due to shortness of breath, hypoxia with O2 sats low 80s, on 3 L eventually increased to NRB.  - cxr showed stable cardiomegaly with moderate bibasilar opacities concerning for edema moderate lactose is with associated pleural effusions. BNP 624, troponin 0.03. Stat EKG showed atrial fibrillation with RVR 130s - Continue IV  diuresis, cardiology following. Negative balance of 1.9 L - 2-D echo 2/27 showed EF of 35-40% with diffuse hypokinesis, massive bilateral enlargement, reduced RV systolic function, moderate TR, findings consistent with dilated cardiomyopathy   Active Problems:   Sepsis due to pseudomonas bacteremia and Candida Albicans now, source unclear at this time, RLE cellulitis  - Sepsis criteria met on admission with fever, tachycardia, tachypnea, leukocytosis, lactic acidosis - Blood cx on admission was positive for Pseudomonas.   -Initially started on vancomycin and Zosyn, ID was consulted, recommended meropenem. - Repeat blood cultures 2/25 showed Candida albicans. Started on Diflucan IV. Source unclear - 2-D echo showed EF 35-40%, no vegetations. - TEE currently on hold until respiratory status to more stable, tentatively planned on 3/5 - Repeat blood cultures 2/27 has been negative    RLE cellulitis/Left transmetatarsal amputation on 09/11/16 - current ABX should be adequate in coverage  - LE dopplers negative for DVT - seen by Dr Sharol Given- he can follow up in 2 weeks in his clinic.     Chronic atrial fibrillation (HCC) with RVR - CHADS vasc score 4 - On Anticoagulation with coumadin - Continue rate control  with metoprolol - Cardiology consulted    Myasthenia gravis ([place]) - On chronic steroids    Amputated toe, left (HCC) - Stable     Hypokalemia - Replaced    Benign essential HTN - Continue metoprolol    Code Status: Partial code, no intubation DVT Prophylaxis:  Coumadin Family Communication: Discussed in detail with the patient, all imaging results, lab results explained to the patient and daughter at the bedside  Disposition Plan:   Time Spent in minutes  15 minutes  Procedures:  echo  Consultants:   Cardiology  ID   Anti microbials:   Vanco and zosyn 09/20/2016   Now on Meropenem and DiFlucan   Medications  Scheduled Meds: . digoxin  0.0625 mg Oral Daily    . escitalopram  5 mg Oral QHS  . feeding supplement (ENSURE ENLIVE)  237 mL Oral BID BM  . fluconazole (DIFLUCAN) IV  400 mg Intravenous Q24H  . fluticasone  1 spray Each Nare BID  . hydrALAZINE  5 mg Oral Q6H  . mouth rinse  15 mL Mouth Rinse BID  . meropenem (MERREM) IV  2 g Intravenous Q8H  . metoprolol tartrate  25 mg Oral BID  . pantoprazole  40 mg Oral Daily  . polycarbophil  625 mg Oral TID  . polyethylene glycol  17 g Oral QHS  . predniSONE  10 mg Oral q morning - 10a  . warfarin  1 mg Oral ONCE-1800  . Warfarin - Pharmacist Dosing Inpatient   Does not apply q1800   Continuous Infusions: PRN Meds:.acetaminophen, guaiFENesin, HYDROcodone-acetaminophen, ipratropium, ondansetron **OR** ondansetron (ZOFRAN) IV   Antibiotics   Anti-infectives    Start     Dose/Rate Route Frequency Ordered Stop   09/25/16 0900  fluconazole (DIFLUCAN) IVPB 400 mg     400 mg 100 mL/hr over 120 Minutes Intravenous Every 24 hours 09/24/16 0832     09/24/16 0900  anidulafungin (ERAXIS) 200 mg in sodium chloride 0.9 % 200 mL IVPB  Status:  Discontinued     200 mg over 180 Minutes Intravenous Every 24 hours 09/24/16 0817 09/24/16 0832   09/24/16 0900  meropenem (MERREM) 2 g in sodium chloride 0.9 % 100 mL IVPB     2 g 200 mL/hr over 30 Minutes Intravenous Every 8 hours 09/24/16 0827     09/24/16 0900  fluconazole (DIFLUCAN) IVPB 800 mg     800 mg 200 mL/hr over 120 Minutes Intravenous  Once 09/24/16 0832 09/24/16 1504   09/23/16 1800  ciprofloxacin (CIPRO) tablet 750 mg  Status:  Discontinued     750 mg Oral 2 times daily 09/23/16 1330 09/24/16 0816   09/22/16 2300  vancomycin (VANCOCIN) 1,250 mg in sodium chloride 0.9 % 250 mL IVPB  Status:  Discontinued     1,250 mg 166.7 mL/hr over 90 Minutes Intravenous Every 12 hours 09/22/16 1227 09/23/16 0851   09/21/16 0800  piperacillin-tazobactam (ZOSYN) IVPB 3.375 g  Status:  Discontinued     3.375 g 12.5 mL/hr over 240 Minutes Intravenous Every 8  hours 09/21/16 0733 09/23/16 1330   09/21/16 0730  piperacillin-tazobactam (ZOSYN) IVPB 3.375 g  Status:  Discontinued     3.375 g 100 mL/hr over 30 Minutes Intravenous Every 8 hours 09/21/16 0724 09/21/16 0730   09/20/16 2300  vancomycin (VANCOCIN) IVPB 1000 mg/200 mL premix  Status:  Discontinued     1,000 mg 200 mL/hr over 60 Minutes Intravenous Every 12 hours 09/20/16 2247 09/22/16 1227   09/20/16  2145  cefTRIAXone (ROCEPHIN) 1 g in dextrose 5 % 50 mL IVPB  Status:  Discontinued     1 g 100 mL/hr over 30 Minutes Intravenous  Once 09/20/16 2130 09/20/16 2132   09/20/16 2145  piperacillin-tazobactam (ZOSYN) IVPB 3.375 g     3.375 g 100 mL/hr over 30 Minutes Intravenous  Once 09/20/16 2132 09/20/16 2318        Subjective:   Richard Bradley was seen and examined today. Shortness of breath is improving, no fevers. O2 weaned down to 4 L. Able to speak in full sentences today. Denies any nausea or vomiting, no fevers or chills.   Patient denies dizziness, abdominal pain, N/V/D/C, new weakness, numbess, tingling. No acute events overnight.    Objective:   Vitals:   09/28/16 0103 09/28/16 0528 09/28/16 1214 09/28/16 1215  BP: 117/82 134/86 118/86   Pulse: 89 (!) 115  (!) 106  Resp: _0 Temp:  97.7 F (36.5 C)  97.7 F (36.5 C)  TempSrc:  Oral  Oral  SpO2: 93% 96%  92%  Weight:  75.6 kg (166 lb 9.6 oz)    Height:        Intake/Output Summary (Last 24 hours) at 09/28/16 1322 Last data filed at 09/28/16 1217  Gross per 24 hour  Intake             1357 ml  Output             2350 ml  Net             -993 ml     Wt Readings from Last 3 Encounters:  09/28/16 75.6 kg (166 lb 9.6 oz)  09/11/16 81.6 kg (180 lb)  08/12/16 81.2 kg (179 lb)     Exam  General: Alert and oriented x 3, NAD  HEENT:    Neck: Supple, no JVD, no masses  Cardiovascular: S1 S2 auscultated, Irregularly irregular, tachycardia   Respiratory: Bibasilar crackles  Gastrointestinal: Soft,  nontender, nondistended, + bowel sounds  Ext: no cyanosis clubbing. RLE , Trace edema. TMT amputation left  Neuro: No new deficits  Skin: No rashes  Psych: Normal affect and demeanor, alert and oriented x3    Data Reviewed:  I have personally reviewed following labs and imaging studies  Micro Results Recent Results (from the past 240 hour(s))  Blood culture (routine x 2)     Status: None   Collection Time: 09/20/16  9:45 PM  Result Value Ref Range Status   Specimen Description BLOOD RIGHT FOREARM  Final   Special Requests BOTTLES DRAWN AEROBIC AND ANAEROBIC 5ML  Final   Culture NO GROWTH 5 DAYS  Final   Report Status 09/25/2016 FINAL  Final  Blood culture (routine x 2)     Status: Abnormal   Collection Time: 09/20/16 10:05 PM  Result Value Ref Range Status   Specimen Description BLOOD RIGHT HAND  Final   Special Requests IN PEDIATRIC BOTTLE 2ML  Final   Culture  Setup Time   Final    GRAM NEGATIVE RODS IN PEDIATRIC BOTTLE CRITICAL RESULT CALLED TO, READ BACK BY AND VERIFIED WITH: A. JOHNSTON, PHARM, 09/21/16 AT 1733 BY J FUDESCO    Culture PSEUDOMONAS AERUGINOSA (A)  Final   Report Status 09/23/2016 FINAL  Final   Organism ID, Bacteria PSEUDOMONAS AERUGINOSA  Final      Susceptibility   Pseudomonas aeruginosa - MIC*    CEFTAZIDIME 16 INTERMEDIATE Intermediate     CIPROFLOXACIN  1 SENSITIVE Sensitive     GENTAMICIN <=1 SENSITIVE Sensitive     IMIPENEM 2 SENSITIVE Sensitive     PIP/TAZO 32 SENSITIVE Sensitive     CEFEPIME INTERMEDIATE Intermediate     * PSEUDOMONAS AERUGINOSA  Blood Culture ID Panel (Reflexed)     Status: Abnormal   Collection Time: 09/20/16 10:05 PM  Result Value Ref Range Status   Enterococcus species NOT DETECTED NOT DETECTED Final   Listeria monocytogenes NOT DETECTED NOT DETECTED Final   Staphylococcus species NOT DETECTED NOT DETECTED Final   Staphylococcus aureus NOT DETECTED NOT DETECTED Final   Streptococcus species NOT DETECTED NOT DETECTED  Final   Streptococcus agalactiae NOT DETECTED NOT DETECTED Final   Streptococcus pneumoniae NOT DETECTED NOT DETECTED Final   Streptococcus pyogenes NOT DETECTED NOT DETECTED Final   Acinetobacter baumannii NOT DETECTED NOT DETECTED Final   Enterobacteriaceae species NOT DETECTED NOT DETECTED Final   Enterobacter cloacae complex NOT DETECTED NOT DETECTED Final   Escherichia coli NOT DETECTED NOT DETECTED Final   Klebsiella oxytoca NOT DETECTED NOT DETECTED Final   Klebsiella pneumoniae NOT DETECTED NOT DETECTED Final   Proteus species NOT DETECTED NOT DETECTED Final   Serratia marcescens NOT DETECTED NOT DETECTED Final   Carbapenem resistance NOT DETECTED NOT DETECTED Final   Haemophilus influenzae NOT DETECTED NOT DETECTED Final   Neisseria meningitidis NOT DETECTED NOT DETECTED Final   Pseudomonas aeruginosa DETECTED (A) NOT DETECTED Final    Comment: CRITICAL RESULT CALLED TO, READ BACK BY AND VERIFIED WITH: A. JOHNSTON, PHARM, 09/21/16 AT 1733 BY J FUDESCO    Candida albicans NOT DETECTED NOT DETECTED Final   Candida glabrata NOT DETECTED NOT DETECTED Final   Candida krusei NOT DETECTED NOT DETECTED Final   Candida parapsilosis NOT DETECTED NOT DETECTED Final   Candida tropicalis NOT DETECTED NOT DETECTED Final  Urine culture     Status: Abnormal   Collection Time: 09/21/16  3:08 PM  Result Value Ref Range Status   Specimen Description URINE, RANDOM  Final   Special Requests NONE  Final   Culture <10,000 COLONIES/mL INSIGNIFICANT GROWTH (A)  Final   Report Status 09/22/2016 FINAL  Final  Culture, blood (routine x 2)     Status: None   Collection Time: 09/22/16  4:28 AM  Result Value Ref Range Status   Specimen Description BLOOD RIGHT HAND  Final   Special Requests BOTTLES DRAWN AEROBIC ONLY 5CC  Final   Culture NO GROWTH 5 DAYS  Final   Report Status 09/27/2016 FINAL  Final  Culture, blood (routine x 2)     Status: Abnormal   Collection Time: 09/22/16  4:34 AM  Result Value  Ref Range Status   Specimen Description BLOOD LEFT HAND  Final   Special Requests BOTTLES DRAWN AEROBIC ONLY 5CC  Final   Culture  Setup Time   Final    YEAST AEROBIC BOTTLE ONLY CRITICAL RESULT CALLED TO, READ BACK BY AND VERIFIED WITH: Sandria Bales [phone] MLM    Culture CANDIDA ALBICANS (A)  Final   Report Status 09/25/2016 FINAL  Final  Blood Culture ID Panel (Reflexed)     Status: Abnormal   Collection Time: 09/22/16  4:34 AM  Result Value Ref Range Status   Enterococcus species NOT DETECTED NOT DETECTED Final   Listeria monocytogenes NOT DETECTED NOT DETECTED Final   Staphylococcus species NOT DETECTED NOT DETECTED Final   Staphylococcus aureus NOT DETECTED NOT DETECTED Final   Streptococcus species NOT DETECTED  NOT DETECTED Final   Streptococcus agalactiae NOT DETECTED NOT DETECTED Final   Streptococcus pneumoniae NOT DETECTED NOT DETECTED Final   Streptococcus pyogenes NOT DETECTED NOT DETECTED Final   Acinetobacter baumannii NOT DETECTED NOT DETECTED Final   Enterobacteriaceae species NOT DETECTED NOT DETECTED Final   Enterobacter cloacae complex NOT DETECTED NOT DETECTED Final   Escherichia coli NOT DETECTED NOT DETECTED Final   Klebsiella oxytoca NOT DETECTED NOT DETECTED Final   Klebsiella pneumoniae NOT DETECTED NOT DETECTED Final   Proteus species NOT DETECTED NOT DETECTED Final   Serratia marcescens NOT DETECTED NOT DETECTED Final   Haemophilus influenzae NOT DETECTED NOT DETECTED Final   Neisseria meningitidis NOT DETECTED NOT DETECTED Final   Pseudomonas aeruginosa NOT DETECTED NOT DETECTED Final   Candida albicans DETECTED (A) NOT DETECTED Final    Comment: CRITICAL RESULT CALLED TO, READ BACK BY AND VERIFIED WITH: PHARMD N BATCHELDER [phone] MLM    Candida glabrata NOT DETECTED NOT DETECTED Final   Candida krusei NOT DETECTED NOT DETECTED Final   Candida parapsilosis NOT DETECTED NOT DETECTED Final   Candida tropicalis NOT DETECTED NOT DETECTED  Final  Culture, blood (routine x 2)     Status: None (Preliminary result)   Collection Time: 09/24/16  4:44 PM  Result Value Ref Range Status   Specimen Description BLOOD RIGHT HAND  Final   Special Requests IN PEDIATRIC BOTTLE 1ML  Final   Culture NO GROWTH 3 DAYS  Final   Report Status PENDING  Incomplete  Culture, blood (routine x 2)     Status: None (Preliminary result)   Collection Time: 09/24/16  5:03 PM  Result Value Ref Range Status   Specimen Description BLOOD LEFT HAND  Final   Special Requests IN PEDIATRIC BOTTLE [place]  Final   Culture NO GROWTH 3 DAYS  Final   Report Status PENDING  Incomplete    Radiology Reports Dg Chest 2 View  Result Date: 09/20/2016 CLINICAL DATA:  Nausea chills and dry heaves EXAM: CHEST  2 VIEW COMPARISON:  06/14/2016 FINDINGS: Mildly low lung volumes. Extensive calcified pleural plaques. Small bilateral effusions. No focal consolidation. Stable moderate cardiomegaly. No pneumothorax. Atherosclerosis. Previous vertebral augmentation of the thoracolumbar spine. Moderate compression of a mid thoracic vertebra. IMPRESSION: 1. Small bilateral effusions.  No focal consolidation. 2. Stable moderate cardiomegaly without overt edema 3. Extensive calcified pleural plaques Electronically Signed   By: Donavan Foil M.D.   On: 09/20/2016 21:19   Dg Chest Port 1 View  Result Date: 09/26/2016 CLINICAL DATA:  Dyspnea. EXAM: PORTABLE CHEST 1 VIEW COMPARISON:  Radiographs of September 20, 2016. FINDINGS: Stable cardiomegaly. Atherosclerosis of thoracic aorta is noted. No pneumothorax is noted. Moderate bibasilar opacities are noted concerning for edema or atelectasis with associated pleural effusions. 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 Bony thorax is unremarkable. IMPRESSION: Aortic atherosclerosis. Stable cardiomegaly. Moderate bibasilar opacities are noted concerning for edema or atelectasis with associated pleural effusions.  Electronically Signed   By: Marijo Conception, M.D.   On: 09/26/2016 11:47    Lab Data:  CBC:  Recent Labs Lab 09/23/16 0541 09/24/16 0823 09/25/16 0337 09/26/16 0357 09/27/16 0019  WBC 18.4* 12.3* 10.3 10.2 10.3  HGB 10.8* 10.6* 10.6* 11.0* 10.7*  HCT 34.6* 32.6* 33.7* 35.0* 34.3*  MCV 102.4* 100.9* 101.5* 102.3* 102.7*  PLT 176 182 207 217 119   Basic Metabolic Panel:  Recent Labs Lab 09/24/16 0823 09/25/16 4174 09/26/16 0357 09/27/16 0019 09/28/16 0502  NA 137 141 140 142 141  K 2.9* 3.7 3.3* 4.1 4.0  CL 99* 103 101 99* 93*  CO2 30 32 33* 35* 37*  GLUCOSE 97 102* 96 140* 94  BUN _0 CREATININE 0.71 0.72 0.78 0.83 0.81  CALCIUM 8.2* 8.4* 8.2* 8.4* 8.6*  MG 1.9 2.0 2.0 1.9  --    GFR: Estimated Creatinine Clearance: 67.4 mL/min (by C-G formula based on SCr of 0.81 mg/dL). Liver Function Tests: No results for input(s): AST, ALT, ALKPHOS, BILITOT, PROT, ALBUMIN in the last 168 hours. No results for input(s): LIPASE, AMYLASE in the last 168 hours. No results for input(s): AMMONIA in the last 168 hours. Coagulation Profile:  Recent Labs Lab 09/24/16 0540 09/25/16 0337 09/26/16 0357 09/27/16 0019 09/28/16 0502  INR 2.48 2.66 3.16 3.60 3.05   Cardiac Enzymes:  Recent Labs Lab 09/26/16 1012 09/26/16 1245 09/26/16 1852 09/27/16 0019  TROPONINI 0.03* 0.03* 0.09* <0.03   BNP (last 3 results) No results for input(s): PROBNP in the last 8760 hours. HbA1C: No results for input(s): HGBA1C in the last 72 hours. CBG: No results for input(s): GLUCAP in the last 168 hours. Lipid Profile: No results for input(s): CHOL, HDL, LDLCALC, TRIG, CHOLHDL, LDLDIRECT in the last 72 hours. Thyroid Function Tests: No results for input(s): TSH, T4TOTAL, FREET4, T3FREE, THYROIDAB in the last 72 hours. Anemia Panel: No results for input(s): VITAMINB12, FOLATE, FERRITIN, TIBC, IRON, RETICCTPCT in the last 72 hours. Urine analysis:    Component Value Date/Time    COLORURINE YELLOW 09/20/2016 2024   APPEARANCEUR HAZY (A) 09/20/2016 2024   LABSPEC 1.015 09/20/2016 2024   PHURINE 6.0 09/20/2016 2024   GLUCOSEU NEGATIVE 09/20/2016 2024   HGBUR MODERATE (A) 09/20/2016 2024   BILIRUBINUR NEGATIVE 09/20/2016 [place] NEGATIVE 09/20/2016 2024   PROTEINUR NEGATIVE 09/20/2016 2024   NITRITE NEGATIVE 09/20/2016 2024   LEUKOCYTESUR NEGATIVE 09/20/2016 2024     Mylisa Brunson M.D. [facility] 09/28/2016, 1:22 PM  Pager: [phone] Between 7am to 7pm - call Pager - [phone]  After 7pm go to [email or web address] - password [email or web address]  Call night coverage person covering after 7pm

## 2016-09-28 NOTE — Progress Notes (Signed)
ANTICOAGULATION CONSULT NOTE - Follow Up Consult  Pharmacy Consult for Warfarin Indication: atrial fibrillation  Assessment: 73 yoM admitted 2/23 on warfarin at home for Afib (CHADSVASC = 4). Home dose of warfarin is 5 mg MWF, 2.5 mg all other days (weekly dose of 25 mg). INR was 2.28 on admission.   Today's INR is 3.05 (supratherapeutic) likely due to drug-drug interaction with fluconazole and poor po intake. Warfarin has been held reduced and then held (x 2 nights) - the INR has trended down close to goal, and is likely to continue to trend down. CBC is stable, no overt bleeding noted.   Goal of Therapy:  INR 2-3 Monitor platelets by anticoagulation protocol: Yes   Plan:  Warfarin 1 mg x 1 tonight Monitor daily INR, CBC, po intake, s/sx bleeding   Allergies  Allergen Reactions  . Tape Other (See Comments)    PATIENT'S SKIN IS VERY THIN AND WILL TEAR AND BRUISE VERY EASILY!!    Patient Measurements: Height: 6\' 3"  (190.5 cm) Weight: 166 lb 9.6 oz (75.6 kg) (Scale B) IBW/kg (Calculated) : 84.5  Vital Signs: Temp: 97.7 F (36.5 C) (03/03 0528) Temp Source: Oral (03/03 0528) BP: 134/86 (03/03 0528) Pulse Rate: 115 (03/03 0528)  Labs:  Recent Labs  09/26/16 0357  09/26/16 1245 09/26/16 1852 09/27/16 0019 09/28/16 0502  HGB 11.0*  --   --   --  10.7*  --   HCT 35.0*  --   --   --  34.3*  --   PLT 217  --   --   --  206  --   LABPROT 33.2*  --   --   --  36.8* 32.2*  INR 3.16  --   --   --  3.60 3.05  CREATININE 0.78  --   --   --  0.83 0.81  TROPONINI  --   < > 0.03* 0.09* <0.03  --   < > = values in this interval not displayed.  Estimated Creatinine Clearance: 67.4 mL/min (by C-G formula based on SCr of 0.81 mg/dL).  Medications:  Scheduled:  . digoxin  0.0625 mg Oral Daily  . escitalopram  5 mg Oral QHS  . feeding supplement (ENSURE ENLIVE)  237 mL Oral BID BM  . fluconazole (DIFLUCAN) IV  400 mg Intravenous Q24H  . fluticasone  1 spray Each Nare BID  .  hydrALAZINE  5 mg Oral Q6H  . mouth rinse  15 mL Mouth Rinse BID  . meropenem (MERREM) IV  2 g Intravenous Q8H  . metoprolol tartrate  25 mg Oral BID  . pantoprazole  40 mg Oral Daily  . polycarbophil  625 mg Oral TID  . polyethylene glycol  17 g Oral QHS  . predniSONE  10 mg Oral q morning - 10a  . warfarin  1 mg Oral ONCE-1800  . Warfarin - Pharmacist Dosing Inpatient   Does not apply [id number]    Belia Heman, PharmD PGY1 Pharmacy Resident [phone] (Pager) 09/28/2016 8:50 AM

## 2016-09-28 NOTE — Progress Notes (Signed)
Progress Note  Patient Name: Richard Bradley Date of Encounter: 09/28/2016  Primary Cardiologist: Linard Millers  Subjective   Breathing is improved compared to yesterday. TEE scheduled for Monday.  Inpatient Medications    Scheduled Meds: . digoxin  0.0625 mg Oral Daily  . escitalopram  5 mg Oral QHS  . feeding supplement (ENSURE ENLIVE)  237 mL Oral BID BM  . fluconazole (DIFLUCAN) IV  400 mg Intravenous Q24H  . fluticasone  1 spray Each Nare BID  . hydrALAZINE  5 mg Oral Q6H  . mouth rinse  15 mL Mouth Rinse BID  . meropenem (MERREM) IV  2 g Intravenous Q8H  . metoprolol tartrate  25 mg Oral BID  . pantoprazole  40 mg Oral Daily  . polycarbophil  625 mg Oral TID  . polyethylene glycol  17 g Oral QHS  . predniSONE  10 mg Oral q morning - 10a  . warfarin  1 mg Oral ONCE-1800  . Warfarin - Pharmacist Dosing Inpatient   Does not apply q1800   Continuous Infusions:  PRN Meds: acetaminophen, guaiFENesin, HYDROcodone-acetaminophen, ipratropium, ondansetron **OR** ondansetron (ZOFRAN) IV   Vital Signs    Vitals:   09/28/16 0103 09/28/16 0528 09/28/16 1214 09/28/16 1215  BP: 117/82 134/86 118/86   Pulse: 89 (!) 115  (!) 106  Resp: 18 18  19   Temp:  97.7 F (36.5 C)  97.7 F (36.5 C)  TempSrc:  Oral  Oral  SpO2: 93% 96%  92%  Weight:  166 lb 9.6 oz (75.6 kg)    Height:        Intake/Output Summary (Last 24 hours) at 09/28/16 1327 Last data filed at 09/28/16 1217  Gross per 24 hour  Intake             1357 ml  Output             2350 ml  Net             -993 ml   Filed Weights   09/26/16 0631 09/27/16 0500 09/28/16 0528  Weight: 168 lb 12.8 oz (76.6 kg) 172 lb 1.6 oz (78.1 kg) 166 lb 9.6 oz (75.6 kg)    Telemetry    Atrial fibrillation with poor rate control - Personally Reviewed  ECG    Atrial fibrillation with rapid ventricular response. Premature ventricular contractions. No acute ischemic change. - Personally Reviewed  Physical Exam  Elderly, frail, and  in mild distress. GEN: No acute distress.   Neck:  mild JVD Cardiac: IIRR, no murmurs, rubs, or gallops.  Respiratory:  diminished breath sounds diffusely. Rales heard throughout both mid lung fields.Marland Kitchen GI: Soft, nontender, non-distended  MS: No edema; No deformity. Neuro:  Nonfocal  Psych: Normal affect   Labs    Chemistry  Recent Labs Lab 09/26/16 0357 09/27/16 0019 09/28/16 0502  NA 140 142 141  K 3.3* 4.1 4.0  CL 101 99* 93*  CO2 33* 35* 37*  GLUCOSE 96 140* 94  BUN 7 9 11   CREATININE 0.78 0.83 0.81  CALCIUM 8.2* 8.4* 8.6*  GFRNONAA >60 >60 >60  GFRAA >60 >60 >60  ANIONGAP 6 8 11      Hematology  Recent Labs Lab 09/25/16 0337 09/26/16 0357 09/27/16 0019  WBC 10.3 10.2 10.3  RBC 3.32* 3.42* 3.34*  HGB 10.6* 11.0* 10.7*  HCT 33.7* 35.0* 34.3*  MCV 101.5* 102.3* 102.7*  MCH 31.9 32.2 32.0  MCHC 31.5 31.4 31.2  RDW 14.0 14.4 14.4  PLT 207 217 206    Cardiac Enzymes  Recent Labs Lab 09/26/16 1012 09/26/16 1245 09/26/16 1852 09/27/16 0019  TROPONINI 0.03* 0.03* 0.09* <0.03   No results for input(s): TROPIPOC in the last 168 hours.   BNP  Recent Labs Lab 09/26/16 1105  BNP 624.0*     DDimer No results for input(s): DDIMER in the last 168 hours.   Radiology    No results found. The chest x-ray was personally reviewed and looks terrible.- 09/27/16  Cardiac Studies   Echocardiogram, 09/24/26: Study Conclusions  - Left ventricle: The cavity size was severely dilated. Wall   thickness was normal. Systolic function was moderately reduced.   The estimated ejection fraction was in the range of 35% to 40%.   Diffuse hypokinesis. The study is not technically sufficient to   allow evaluation of LV diastolic function. - Aortic valve: Sclerosis without stenosis. There was moderate   regurgitation. - Mitral valve: Mildly thickened leaflets . There was severe   regurgitation directed posteriorly. - Left atrium: Massively dilated (115 ml/m2). -  Right ventricle: The cavity size was mildly dilated. Reduced RV   systolic function. Lateral annulus peak S velocity: 7.62 cm/s. - Right atrium: Massively dilated (90 ml/m2). - Tricuspid valve: There was moderate regurgitation. - Pulmonary arteries: PA peak pressure: 52 mm Hg (S). - Inferior vena cava: The vessel was dilated. The respirophasic   diameter changes were blunted (< 50%), consistent with elevated   central venous pressure. - Pericardium, extracardiac: A trivial pericardial effusion was   identified posterior to the heart. There was a left pleural   effusion.  Recommendations:  LVEF 35-40%, severely dilated LV with global hypokinesis and normal wall thickness, moderate AI, severe posteriorly directed MR, massive biatrial enlargement, mild RVE with reduced RV systolic function, moderate TR, RVSP 52 mmHg, dilated IVC, trivial pericardial effusion with left pleural effusion. Findings consistent with dilated cardiomyopathy.   Patient Profile     83 y.o. male with history of myasthenia gravis, chronic atrial fibrillation, chronic anticoagulation with Coumadin, essential hypertension, pulmonary fibrosis, chronic combined systolic and diastolic heart failure with prior cath greater than 5 years ago without obstructive disease, type 2 diabetes mellitus, peripheral vascular disease, and prior history of PVD with partial left lower extremity amputation. Admitted on this occasion with poly-organism bacteremia. Source of bacteremia has not been identified.  Assessment & Plan    1. Acute on chronic combined systolic and diastolic heart failure. Improvement in respiratory status overnight with diuresis. Continue diuresis over we can with goal to allow transesophageal echo early next week. We'll tentatively attempt to reschedule for Monday. Will add out by 1.9 L since admission. We'll continue diuresis.   2. Poly-organism bacteremia: Gram-negative ride and Candida. Plan TEE once heart  failure resolved to exclude the possibility of endocarditis. TEE is set for Monday. New orders will need to be written on Sunday and patient made nothing by mouth if it appears he is stable enough to undergo the procedure.  3. Chronic atrial fibrillation, Rate control improved and will likely continue to impove as his overall status improves.  4. Severe mitral regurgitation, attempt afterload reduction with hydralazine as tolerated by blood pressure.If he tolerates hydralazine, consider optimizing and eventually adding long-acting nitrates.  Overall very tenuous situation in this 83 year old gentleman with poly-organism bacteremia, left lower extremity partial amputation, and acute CHF related to volume loading and diuretic with hold on admission. Depending upon course, may need to consider further discussions with family concerning  CODE STATUS.  Signed, Will Meredith Leeds, MD  09/28/2016, 1:27 PM

## 2016-09-29 ENCOUNTER — Inpatient Hospital Stay (HOSPITAL_COMMUNITY): Payer: Medicare Other

## 2016-09-29 LAB — BASIC METABOLIC PANEL
ANION GAP: 6 (ref 5–15)
BUN: 9 mg/dL (ref 6–20)
CHLORIDE: 96 mmol/L — AB (ref 101–111)
CO2: 38 mmol/L — ABNORMAL HIGH (ref 22–32)
Calcium: 8.8 mg/dL — ABNORMAL LOW (ref 8.9–10.3)
Creatinine, Ser: 0.76 mg/dL (ref 0.61–1.24)
Glucose, Bld: 100 mg/dL — ABNORMAL HIGH (ref 65–99)
POTASSIUM: 4 mmol/L (ref 3.5–5.1)
SODIUM: 140 mmol/L (ref 135–145)

## 2016-09-29 LAB — CULTURE, BLOOD (ROUTINE X 2)
CULTURE: NO GROWTH
CULTURE: NO GROWTH

## 2016-09-29 LAB — PROTIME-INR
INR: 2.83
Prothrombin Time: 30.3 seconds — ABNORMAL HIGH (ref 11.4–15.2)

## 2016-09-29 MED ORDER — SODIUM CHLORIDE 0.9 % IV SOLN: INTRAVENOUS | Status: DC

## 2016-09-29 MED ORDER — PREDNISONE 20 MG PO TABS: 20.0000 mg | ORAL_TABLET | Freq: Every morning | ORAL | Status: DC

## 2016-09-29 MED ORDER — PREDNISONE 10 MG PO TABS
10.0000 mg | ORAL_TABLET | Freq: Once | ORAL | Status: AC
  Administered 2016-09-29: 10 mg via ORAL
  Filled 2016-09-29: qty 1

## 2016-09-29 MED ORDER — WARFARIN SODIUM 1 MG PO TABS
1.0000 mg | ORAL_TABLET | Freq: Once | ORAL | Status: AC
  Filled 2016-09-29: qty 1

## 2016-09-29 MED ORDER — SODIUM CHLORIDE 0.9 % IV SOLN
INTRAVENOUS | Status: DC
  Administered 2016-09-29: 20 mL/h via INTRAVENOUS
  Administered 2016-09-30: 21:00:00 via INTRAVENOUS

## 2016-09-29 MED ORDER — WARFARIN SODIUM 2 MG PO TABS
1.0000 mg | ORAL_TABLET | Freq: Once | ORAL | Status: DC
  Administered 2016-09-29: 1 mg via ORAL
  Filled 2016-09-29: qty 0.5

## 2016-09-29 NOTE — Progress Notes (Signed)
Pt and family was concerned about starting the Lasix again for tom, please address, thanks [place]

## 2016-09-29 NOTE — Progress Notes (Addendum)
[facility]                                                                              Patient Demographics  Richard Bradley, is a 83 y.o. male, DOB - April 23, 1926, [id number]  Admit date - 09/20/2016   Admitting Physician [name], MD  Outpatient Primary MD for the patient is [name], MD  Outpatient specialists:   LOS - 9  days    Chief Complaint  Patient presents with  . Nausea  . Fever  . flu like symptoms       Brief summary   83 y.o.malewith medical history significant for chronic atrial fibrillation on AC with coumadin, myasthenia gravis, prostate cancer, basal cell CA, recently underwent a left TMTamputation and is coming to the emergency department with nausea, dry heaves and chills since the morning of the admission.  On admission. BP was 90/48 and has improved with IV fluids to 159/109. HR was 121, RR 18-33, T max was 101.32F, oxygen saturation was 86% on room air but has improved to 95% with [place] oxygen support. Blood work was notable for WBC count 19.5, hemoglobin 12.1, potassium 2.8 which was supplemented. Lactic acid was 1.98. CXR showed small bilateral effusions but no focal consolidation. UA showed rare bacteria, no leukocytes. He was started on empiric broad spectrum abx until blood and urine cx results are back.  I assumed care on 09/26/16  Assessment & Plan    Principal problem Acute hypoxic respiratory failure/pulmonary edema and due to acute on chronic systolic CHF exacerbation - Likely due to volume overload, patient's Lasix was held through the hospitalization.  -  Continue IV diuresis, cardiology following. Negative balance of 2.4 L - 2-D echo 2/27 showed EF of 35-40% with diffuse hypokinesis, massive bilateral enlargement, reduced RV systolic function, moderate TR, findings consistent with dilated cardiomyopathy - Chest x-ray this morning showed improving bilateral pleural effusions  Active Problems:   Sepsis due to  pseudomonas bacteremia and Candida Albicans now, source unclear at this time, RLE cellulitis  - Sepsis criteria met on admission with fever, tachycardia, tachypnea, leukocytosis, lactic acidosis - Blood cx on admission was positive for Pseudomonas.   -Initially started on vancomycin and Zosyn, ID was consulted, recommended meropenem. - Repeat blood cultures 2/25 showed Candida albicans. Started on Diflucan IV. Source unclear - 2-D echo showed EF 35-40%, no vegetations. - TEE currently on hold until respiratory status to more stable, tentatively planned on 3/5, will keep NPO after MN - Repeat blood cultures 2/27 has been negative    RLE cellulitis/Left transmetatarsal amputation on 09/11/16 - current ABX should be adequate in coverage  - LE dopplers negative for DVT - seen by Dr Sharol Given- he can follow up in 2 weeks in his clinic.     Chronic atrial fibrillation (HCC) with RVR - CHADS vasc score 4 - On Anticoagulation with coumadin - Continue rate control with metoprolol - Cardiology consulted    Myasthenia gravis ([place]) - On chronic steroids, discussed with patient's daughter in detail. She feels that patient has been weaker in the morning, difficulty chewing, difficulty with respiration, more fatigued, typically when  his mysthenia is "acting up". Reviewed prednisone dose with her and Dr Jannifer Franklin (patient's neurologist) notes that patient is supposed on prednisone 49m dose and has been doing well on this dose for long time on this dose. He does not do well on lower dose of prednisone. However, noticed, that his prednisone dose was placed 124mon admission. I have increased it back to 2084mnd will give one extra dose of 25m30m.       Amputated toe, left (HCC) - Stable     Benign essential HTN - Continue metoprolol    Code Status: Partial code, no intubation DVT Prophylaxis:  Coumadin Family Communication: Discussed in detail with the patient, all imaging results, lab results explained  to the patient and daughter at the bedside  Disposition Plan:   Time Spent in minutes  15 minutes  Procedures:  echo  Consultants:   Cardiology  ID   Anti microbials:   Vanco and zosyn 09/20/2016   Now on Meropenem and DiFlucan   Medications  Scheduled Meds: . digoxin  0.0625 mg Oral Daily  . escitalopram  5 mg Oral QHS  . feeding supplement (ENSURE ENLIVE)  237 mL Oral BID BM  . fluconazole (DIFLUCAN) IV  400 mg Intravenous Q24H  . fluticasone  1 spray Each Nare BID  . hydrALAZINE  5 mg Oral Q6H  . mouth rinse  15 mL Mouth Rinse BID  . meropenem (MERREM) IV  2 g Intravenous Q8H  . metoprolol tartrate  25 mg Oral BID  . pantoprazole  40 mg Oral Daily  . polycarbophil  625 mg Oral TID  . polyethylene glycol  17 g Oral QHS  . predniSONE  10 mg Oral q morning - 10a  . warfarin  1 mg Oral ONCE-1800  . Warfarin - Pharmacist Dosing Inpatient   Does not apply q1800   Continuous Infusions: PRN Meds:.acetaminophen, guaiFENesin, HYDROcodone-acetaminophen, ipratropium, ondansetron **OR** ondansetron (ZOFRAN) IV   Antibiotics   Anti-infectives    Start     Dose/Rate Route Frequency Ordered Stop   09/25/16 0900  fluconazole (DIFLUCAN) IVPB 400 mg     400 mg 100 mL/hr over 120 Minutes Intravenous Every 24 hours 09/24/16 0832     09/24/16 0900  anidulafungin (ERAXIS) 200 mg in sodium chloride 0.9 % 200 mL IVPB  Status:  Discontinued     200 mg over 180 Minutes Intravenous Every 24 hours 09/24/16 0817 09/24/16 0832   09/24/16 0900  meropenem (MERREM) 2 g in sodium chloride 0.9 % 100 mL IVPB     2 g 200 mL/hr over 30 Minutes Intravenous Every 8 hours 09/24/16 0827     09/24/16 0900  fluconazole (DIFLUCAN) IVPB 800 mg     800 mg 200 mL/hr over 120 Minutes Intravenous  Once 09/24/16 0832 09/24/16 1504   09/23/16 1800  ciprofloxacin (CIPRO) tablet 750 mg  Status:  Discontinued     750 mg Oral 2 times daily 09/23/16 1330 09/24/16 0816   09/22/16 2300  vancomycin (VANCOCIN)  1,250 mg in sodium chloride 0.9 % 250 mL IVPB  Status:  Discontinued     1,250 mg 166.7 mL/hr over 90 Minutes Intravenous Every 12 hours 09/22/16 1227 09/23/16 0851   09/21/16 0800  piperacillin-tazobactam (ZOSYN) IVPB 3.375 g  Status:  Discontinued     3.375 g 12.5 mL/hr over 240 Minutes Intravenous Every 8 hours 09/21/16 0733 09/23/16 1330   09/21/16 0730  piperacillin-tazobactam (ZOSYN) IVPB 3.375 g  Status:  Discontinued  3.375 g 100 mL/hr over 30 Minutes Intravenous Every 8 hours 09/21/16 0724 09/21/16 0730   09/20/16 2300  vancomycin (VANCOCIN) IVPB 1000 mg/200 mL premix  Status:  Discontinued     1,000 mg 200 mL/hr over 60 Minutes Intravenous Every 12 hours 09/20/16 2247 09/22/16 1227   09/20/16 2145  cefTRIAXone (ROCEPHIN) 1 g in dextrose 5 % 50 mL IVPB  Status:  Discontinued     1 g 100 mL/hr over 30 Minutes Intravenous  Once 09/20/16 2130 09/20/16 2132   09/20/16 2145  piperacillin-tazobactam (ZOSYN) IVPB 3.375 g     3.375 g 100 mL/hr over 30 Minutes Intravenous  Once 09/20/16 2132 09/20/16 2318        Subjective:   Orie Baxendale was seen and examined today.Frustrated with the continuous pulse ox otherwise shortness of breath is improving. Denies any nausea or vomiting, no fevers or chills.   Patient denies dizziness, abdominal pain, N/V/D/C, new weakness, numbess, tingling. No acute events overnight.    Objective:   Vitals:   09/28/16 2158 09/28/16 2352 09/29/16 0000 09/29/16 0527  BP: 113/85 130/84  (!) 132/95  Pulse: 89 (!) 105  (!) 120  Resp:    20  Temp:    98 F (36.7 C)  TempSrc:    Oral  SpO2:   95% 96%  Weight:    75.2 kg (165 lb 12.8 oz)  Height:        Intake/Output Summary (Last 24 hours) at 09/29/16 1115 Last data filed at 09/29/16 0920  Gross per 24 hour  Intake             1155 ml  Output             1651 ml  Net             -496 ml     Wt Readings from Last 3 Encounters:  09/29/16 75.2 kg (165 lb 12.8 oz)  09/11/16 81.6 kg (180 lb)    08/12/16 81.2 kg (179 lb)     Exam  General: Alert and oriented x 3, NAD  HEENT:    Neck: Supple, no JVD, no masses  Cardiovascular: S1 S2 auscultated, Irregularly irregular, tachycardia   Respiratory: Bibasilar cracklesImproving  Gastrointestinal: Soft, nontender, nondistended, + bowel sounds  Ext: no cyanosis clubbing. RLE , Trace edema. TMT amputation left  Neuro: No new deficits  Skin: No rashes  Psych: Normal affect and demeanor, alert and oriented x3    Data Reviewed:  I have personally reviewed following labs and imaging studies  Micro Results Recent Results (from the past 240 hour(s))  Blood culture (routine x 2)     Status: None   Collection Time: 09/20/16  9:45 PM  Result Value Ref Range Status   Specimen Description BLOOD RIGHT FOREARM  Final   Special Requests BOTTLES DRAWN AEROBIC AND ANAEROBIC 5ML  Final   Culture NO GROWTH 5 DAYS  Final   Report Status 09/25/2016 FINAL  Final  Blood culture (routine x 2)     Status: Abnormal   Collection Time: 09/20/16 10:05 PM  Result Value Ref Range Status   Specimen Description BLOOD RIGHT HAND  Final   Special Requests IN PEDIATRIC BOTTLE 2ML  Final   Culture  Setup Time   Final    GRAM NEGATIVE RODS IN PEDIATRIC BOTTLE CRITICAL RESULT CALLED TO, READ BACK BY AND VERIFIED WITH: A. JOHNSTON, PHARM, 09/21/16 AT 1733 BY J FUDESCO    Culture PSEUDOMONAS AERUGINOSA (A)  Final   Report Status 09/23/2016 FINAL  Final   Organism ID, Bacteria PSEUDOMONAS AERUGINOSA  Final      Susceptibility   Pseudomonas aeruginosa - MIC*    CEFTAZIDIME 16 INTERMEDIATE Intermediate     CIPROFLOXACIN 1 SENSITIVE Sensitive     GENTAMICIN <=1 SENSITIVE Sensitive     IMIPENEM 2 SENSITIVE Sensitive     PIP/TAZO 32 SENSITIVE Sensitive     CEFEPIME INTERMEDIATE Intermediate     * PSEUDOMONAS AERUGINOSA  Blood Culture ID Panel (Reflexed)     Status: Abnormal   Collection Time: 09/20/16 10:05 PM  Result Value Ref Range Status    Enterococcus species NOT DETECTED NOT DETECTED Final   Listeria monocytogenes NOT DETECTED NOT DETECTED Final   Staphylococcus species NOT DETECTED NOT DETECTED Final   Staphylococcus aureus NOT DETECTED NOT DETECTED Final   Streptococcus species NOT DETECTED NOT DETECTED Final   Streptococcus agalactiae NOT DETECTED NOT DETECTED Final   Streptococcus pneumoniae NOT DETECTED NOT DETECTED Final   Streptococcus pyogenes NOT DETECTED NOT DETECTED Final   Acinetobacter baumannii NOT DETECTED NOT DETECTED Final   Enterobacteriaceae species NOT DETECTED NOT DETECTED Final   Enterobacter cloacae complex NOT DETECTED NOT DETECTED Final   Escherichia coli NOT DETECTED NOT DETECTED Final   Klebsiella oxytoca NOT DETECTED NOT DETECTED Final   Klebsiella pneumoniae NOT DETECTED NOT DETECTED Final   Proteus species NOT DETECTED NOT DETECTED Final   Serratia marcescens NOT DETECTED NOT DETECTED Final   Carbapenem resistance NOT DETECTED NOT DETECTED Final   Haemophilus influenzae NOT DETECTED NOT DETECTED Final   Neisseria meningitidis NOT DETECTED NOT DETECTED Final   Pseudomonas aeruginosa DETECTED (A) NOT DETECTED Final    Comment: CRITICAL RESULT CALLED TO, READ BACK BY AND VERIFIED WITH: A. JOHNSTON, PHARM, 09/21/16 AT 1733 BY J FUDESCO    Candida albicans NOT DETECTED NOT DETECTED Final   Candida glabrata NOT DETECTED NOT DETECTED Final   Candida krusei NOT DETECTED NOT DETECTED Final   Candida parapsilosis NOT DETECTED NOT DETECTED Final   Candida tropicalis NOT DETECTED NOT DETECTED Final  Urine culture     Status: Abnormal   Collection Time: 09/21/16  3:08 PM  Result Value Ref Range Status   Specimen Description URINE, RANDOM  Final   Special Requests NONE  Final   Culture <10,000 COLONIES/mL INSIGNIFICANT GROWTH (A)  Final   Report Status 09/22/2016 FINAL  Final  Culture, blood (routine x 2)     Status: None   Collection Time: 09/22/16  4:28 AM  Result Value Ref Range Status    Specimen Description BLOOD RIGHT HAND  Final   Special Requests BOTTLES DRAWN AEROBIC ONLY 5CC  Final   Culture NO GROWTH 5 DAYS  Final   Report Status 09/27/2016 FINAL  Final  Culture, blood (routine x 2)     Status: Abnormal   Collection Time: 09/22/16  4:34 AM  Result Value Ref Range Status   Specimen Description BLOOD LEFT HAND  Final   Special Requests BOTTLES DRAWN AEROBIC ONLY 5CC  Final   Culture  Setup Time   Final    YEAST AEROBIC BOTTLE ONLY CRITICAL RESULT CALLED TO, READ BACK BY AND VERIFIED WITH: Sandria Bales [id number] [phone] MLM    Culture CANDIDA ALBICANS (A)  Final   Report Status 09/25/2016 FINAL  Final  Blood Culture ID Panel (Reflexed)     Status: Abnormal   Collection Time: 09/22/16  4:34 AM  Result Value Ref Range Status  Enterococcus species NOT DETECTED NOT DETECTED Final   Listeria monocytogenes NOT DETECTED NOT DETECTED Final   Staphylococcus species NOT DETECTED NOT DETECTED Final   Staphylococcus aureus NOT DETECTED NOT DETECTED Final   Streptococcus species NOT DETECTED NOT DETECTED Final   Streptococcus agalactiae NOT DETECTED NOT DETECTED Final   Streptococcus pneumoniae NOT DETECTED NOT DETECTED Final   Streptococcus pyogenes NOT DETECTED NOT DETECTED Final   Acinetobacter baumannii NOT DETECTED NOT DETECTED Final   Enterobacteriaceae species NOT DETECTED NOT DETECTED Final   Enterobacter cloacae complex NOT DETECTED NOT DETECTED Final   Escherichia coli NOT DETECTED NOT DETECTED Final   Klebsiella oxytoca NOT DETECTED NOT DETECTED Final   Klebsiella pneumoniae NOT DETECTED NOT DETECTED Final   Proteus species NOT DETECTED NOT DETECTED Final   Serratia marcescens NOT DETECTED NOT DETECTED Final   Haemophilus influenzae NOT DETECTED NOT DETECTED Final   Neisseria meningitidis NOT DETECTED NOT DETECTED Final   Pseudomonas aeruginosa NOT DETECTED NOT DETECTED Final   Candida albicans DETECTED (A) NOT DETECTED Final    Comment: CRITICAL RESULT  CALLED TO, READ BACK BY AND VERIFIED WITH: PHARMD N BATCHELDER [id number] MLM    Candida glabrata NOT DETECTED NOT DETECTED Final   Candida krusei NOT DETECTED NOT DETECTED Final   Candida parapsilosis NOT DETECTED NOT DETECTED Final   Candida tropicalis NOT DETECTED NOT DETECTED Final  Culture, blood (routine x 2)     Status: None (Preliminary result)   Collection Time: 09/24/16  4:44 PM  Result Value Ref Range Status   Specimen Description BLOOD RIGHT HAND  Final   Special Requests IN PEDIATRIC BOTTLE 1ML  Final   Culture NO GROWTH 4 DAYS  Final   Report Status PENDING  Incomplete  Culture, blood (routine x 2)     Status: None (Preliminary result)   Collection Time: 09/24/16  5:03 PM  Result Value Ref Range Status   Specimen Description BLOOD LEFT HAND  Final   Special Requests IN PEDIATRIC BOTTLE 1CC  Final   Culture NO GROWTH 4 DAYS  Final   Report Status PENDING  Incomplete    Radiology Reports Dg Chest 2 View  Result Date: 09/29/2016 CLINICAL DATA:  Pleural effusion. EXAM: CHEST  2 VIEW COMPARISON:  Radiograph September 26, 2016. FINDINGS: Stable cardiomegaly. Atherosclerosis thoracic aorta is noted. No pneumothorax is noted. Continued presence of bibasilar edema or atelectasis with associated pleural effusions. Calcified pleural plaques are again noted bilaterally. Bony thorax is unremarkable. IMPRESSION: Aortic atherosclerosis. Stable bibasilar opacities as described above. Electronically Signed   By: Marijo Conception, M.D.   On: 09/29/2016 08:15   Dg Chest 2 View  Result Date: 09/20/2016 CLINICAL DATA:  Nausea chills and dry heaves EXAM: CHEST  2 VIEW COMPARISON:  06/14/2016 FINDINGS: Mildly low lung volumes. Extensive calcified pleural plaques. Small bilateral effusions. No focal consolidation. Stable moderate cardiomegaly. No pneumothorax. Atherosclerosis. Previous vertebral augmentation of the thoracolumbar spine. Moderate compression of a mid thoracic vertebra. IMPRESSION: 1.  Small bilateral effusions.  No focal consolidation. 2. Stable moderate cardiomegaly without overt edema 3. Extensive calcified pleural plaques Electronically Signed   By: Donavan Foil M.D.   On: 09/20/2016 21:19   Dg Chest Port 1 View  Result Date: 09/26/2016 CLINICAL DATA:  Dyspnea. EXAM: PORTABLE CHEST 1 VIEW COMPARISON:  Radiographs of September 20, 2016. FINDINGS: Stable cardiomegaly. Atherosclerosis of thoracic aorta is noted. No pneumothorax is noted. Moderate bibasilar opacities are noted concerning for edema or atelectasis with associated pleural  effusions. 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 Bony thorax is unremarkable. IMPRESSION: Aortic atherosclerosis. Stable cardiomegaly. Moderate bibasilar opacities are noted concerning for edema or atelectasis with associated pleural effusions. Electronically Signed   By: Marijo Conception, M.D.   On: 09/26/2016 11:47    Lab Data:  CBC:  Recent Labs Lab 09/23/16 0541 09/24/16 0823 09/25/16 0337 09/26/16 0357 09/27/16 0019  WBC 18.4* 12.3* 10.3 10.2 10.3  HGB 10.8* 10.6* 10.6* 11.0* 10.7*  HCT 34.6* 32.6* 33.7* 35.0* 34.3*  MCV 102.4* 100.9* 101.5* 102.3* 102.7*  PLT 176 182 207 217 536   Basic Metabolic Panel:  Recent Labs Lab 09/24/16 0823 09/25/16 0337 09/26/16 0357 09/27/16 0019 09/28/16 0502 09/29/16 0358  NA 137 141 140 142 141 140  K 2.9* 3.7 3.3* 4.1 4.0 4.0  CL 99* 103 101 99* 93* 96*  CO2 30 32 33* 35* 37* 38*  GLUCOSE 97 102* 96 140* 94 100*  BUN _0 CREATININE 0.71 0.72 0.78 0.83 0.81 0.76  CALCIUM 8.2* 8.4* 8.2* 8.4* 8.6* 8.8*  MG 1.9 2.0 2.0 1.9  --   --    GFR: Estimated Creatinine Clearance: 67.9 mL/min (by C-G formula based on SCr of 0.76 mg/dL). Liver Function Tests: No results for input(s): AST, ALT, ALKPHOS, BILITOT, PROT, ALBUMIN in the last 168 hours. No results for input(s): LIPASE, AMYLASE in the last 168 hours. No results for input(s): AMMONIA  in the last 168 hours. Coagulation Profile:  Recent Labs Lab 09/25/16 0337 09/26/16 0357 09/27/16 0019 09/28/16 0502 09/29/16 0358  INR 2.66 3.16 3.60 3.05 2.83   Cardiac Enzymes:  Recent Labs Lab 09/26/16 1012 09/26/16 1245 09/26/16 1852 09/27/16 0019  TROPONINI 0.03* 0.03* 0.09* <0.03   BNP (last 3 results) No results for input(s): PROBNP in the last 8760 hours. HbA1C: No results for input(s): HGBA1C in the last 72 hours. CBG: No results for input(s): GLUCAP in the last 168 hours. Lipid Profile: No results for input(s): CHOL, HDL, LDLCALC, TRIG, CHOLHDL, LDLDIRECT in the last 72 hours. Thyroid Function Tests: No results for input(s): TSH, T4TOTAL, FREET4, T3FREE, THYROIDAB in the last 72 hours. Anemia Panel: No results for input(s): VITAMINB12, FOLATE, FERRITIN, TIBC, IRON, RETICCTPCT in the last 72 hours. Urine analysis:    Component Value Date/Time   COLORURINE YELLOW 09/20/2016 2024   APPEARANCEUR HAZY (A) 09/20/2016 2024   LABSPEC 1.015 09/20/2016 2024   PHURINE 6.0 09/20/2016 2024   GLUCOSEU NEGATIVE 09/20/2016 2024   HGBUR MODERATE (A) 09/20/2016 2024   BILIRUBINUR NEGATIVE 09/20/2016 [place] NEGATIVE 09/20/2016 2024   PROTEINUR NEGATIVE 09/20/2016 2024   NITRITE NEGATIVE 09/20/2016 2024   LEUKOCYTESUR NEGATIVE 09/20/2016 2024     Obi Scrima M.D. [facility] 09/29/2016, 11:15 AM  Pager: [phone] Between 7am to 7pm - call Pager - [phone]  After 7pm go to [email or web address] - password [email or web address]  Call night coverage person covering after 7pm

## 2016-09-29 NOTE — Progress Notes (Signed)
Daughter asked that I notify attending MD that she felt that the pt's myasthenia gravis was "acting up." I AMION Dr. Tana Coast, requesting her to call daughter at bedside at [phone].

## 2016-09-29 NOTE — Progress Notes (Signed)
ANTICOAGULATION CONSULT NOTE - Follow Up Consult  Pharmacy Consult for Warfarin Indication: atrial fibrillation  Assessment: 77 yoM admitted 2/23 on warfarin at home for Afib (CHADSVASC = 4). Home dose of warfarin is 5 mg MWF, 2.5 mg all other days (weekly dose of 25 mg). INR was 2.28 on admission.   Today's INR is 2.82 (therapeutic). Dosing cautiously due to drug-drug interaction with fluconazole and poor po intake. CBC is stable, no overt bleeding noted.   Goal of Therapy:  INR 2-3 Monitor platelets by anticoagulation protocol: Yes   Plan:  Warfarin 1 mg x 1 tonight Monitor daily INR, CBC, po intake, s/sx bleeding   Allergies  Allergen Reactions  . Tape Other (See Comments)    PATIENT'S SKIN IS VERY THIN AND WILL TEAR AND BRUISE VERY EASILY!!    Patient Measurements: Height: 6\' 3"  (190.5 cm) Weight: 165 lb 12.8 oz (75.2 kg) IBW/kg (Calculated) : 84.5  Vital Signs: Temp: 98 F (36.7 C) (03/04 0527) Temp Source: Oral (03/04 0527) BP: 132/95 (03/04 0527) Pulse Rate: 120 (03/04 0527)  Labs:  Recent Labs  09/26/16 1245 09/26/16 1852 09/27/16 0019 09/28/16 0502 09/29/16 0358  HGB  --   --  10.7*  --   --   HCT  --   --  34.3*  --   --   PLT  --   --  206  --   --   LABPROT  --   --  36.8* 32.2* 30.3*  INR  --   --  3.60 3.05 2.83  CREATININE  --   --  0.83 0.81 0.76  TROPONINI 0.03* 0.09* <0.03  --   --     Estimated Creatinine Clearance: 67.9 mL/min (by C-G formula based on SCr of 0.76 mg/dL).  Medications:  Scheduled:  . digoxin  0.0625 mg Oral Daily  . escitalopram  5 mg Oral QHS  . feeding supplement (ENSURE ENLIVE)  237 mL Oral BID BM  . fluconazole (DIFLUCAN) IV  400 mg Intravenous Q24H  . fluticasone  1 spray Each Nare BID  . hydrALAZINE  5 mg Oral Q6H  . mouth rinse  15 mL Mouth Rinse BID  . meropenem (MERREM) IV  2 g Intravenous Q8H  . metoprolol tartrate  25 mg Oral BID  . pantoprazole  40 mg Oral Daily  . polycarbophil  625 mg Oral TID  .  polyethylene glycol  17 g Oral QHS  . predniSONE  10 mg Oral q morning - 10a  . warfarin  1 mg Oral ONCE-1800  . Warfarin - Pharmacist Dosing Inpatient   Does not apply [id number]    Belia Heman, PharmD PGY1 Pharmacy Resident [phone] (Pager) 09/29/2016 9:11 AM

## 2016-09-30 ENCOUNTER — Encounter (HOSPITAL_COMMUNITY): Payer: Self-pay | Admitting: Certified Registered Nurse Anesthetist

## 2016-09-30 ENCOUNTER — Other Ambulatory Visit (HOSPITAL_COMMUNITY): Payer: Medicare Other

## 2016-09-30 DIAGNOSIS — J810 Acute pulmonary edema: Secondary | ICD-10-CM

## 2016-09-30 DIAGNOSIS — B49 Unspecified mycosis: Secondary | ICD-10-CM

## 2016-09-30 LAB — BASIC METABOLIC PANEL
ANION GAP: 5 (ref 5–15)
BUN: 9 mg/dL (ref 6–20)
CHLORIDE: 99 mmol/L — AB (ref 101–111)
CO2: 36 mmol/L — AB (ref 22–32)
Calcium: 8.7 mg/dL — ABNORMAL LOW (ref 8.9–10.3)
Creatinine, Ser: 0.75 mg/dL (ref 0.61–1.24)
GFR calc non Af Amer: >60 mL/min (ref >60)
Glucose, Bld: 105 mg/dL — ABNORMAL HIGH (ref 65–99)
Potassium: 4 mmol/L (ref 3.5–5.1)
Sodium: 140 mmol/L (ref 135–145)

## 2016-09-30 LAB — CBC
HCT: 40.2 % (ref 39.0–52.0)
Hemoglobin: 12.6 g/dL — ABNORMAL LOW (ref 13.0–17.0)
MCH: 32 pg (ref 26.0–34.0)
MCHC: 31.3 g/dL (ref 30.0–36.0)
MCV: 102 fL — ABNORMAL HIGH (ref 78.0–100.0)
PLATELETS: 259 10*3/uL (ref 150–400)
RBC: 3.94 MIL/uL — ABNORMAL LOW (ref 4.22–5.81)
RDW: 14.4 % (ref 11.5–15.5)
WBC: 11.6 10*3/uL — AB (ref 4.0–10.5)

## 2016-09-30 LAB — GLUCOSE, CAPILLARY: GLUCOSE-CAPILLARY: 125 mg/dL — AB (ref 65–99)

## 2016-09-30 LAB — PROTIME-INR
INR: 3.04
PROTHROMBIN TIME: 32.2 s — AB (ref 11.4–15.2)

## 2016-09-30 MED ORDER — METHYLPREDNISOLONE SODIUM SUCC 125 MG IJ SOLR
60.0000 mg | Freq: Once | INTRAMUSCULAR | Status: AC
  Administered 2016-09-30: 60 mg via INTRAVENOUS
  Filled 2016-09-30: qty 2

## 2016-09-30 MED ORDER — FUROSEMIDE 10 MG/ML IJ SOLN
40.0000 mg | Freq: Two times a day (BID) | INTRAMUSCULAR | Status: DC
  Administered 2016-09-30 – 2016-10-01 (×3): 40 mg via INTRAVENOUS
  Filled 2016-09-30 (×3): qty 4

## 2016-09-30 MED ORDER — HYDRALAZINE HCL 10 MG PO TABS
10.0000 mg | ORAL_TABLET | Freq: Four times a day (QID) | ORAL | Status: DC
  Administered 2016-09-30 – 2016-10-02 (×7): 10 mg via ORAL
  Filled 2016-09-30 (×7): qty 1

## 2016-09-30 MED ORDER — PREDNISONE 20 MG PO TABS: 40.0000 mg | ORAL_TABLET | Freq: Every morning | ORAL | Status: DC

## 2016-09-30 MED ORDER — PREDNISONE 20 MG PO TABS
40.0000 mg | ORAL_TABLET | Freq: Every day | ORAL | Status: DC
  Administered 2016-10-01 – 2016-10-02 (×2): 40 mg via ORAL
  Filled 2016-09-30 (×2): qty 2

## 2016-09-30 MED ORDER — FUROSEMIDE 10 MG/ML IJ SOLN
20.0000 mg | Freq: Once | INTRAMUSCULAR | Status: AC
Start: 2016-09-30 — End: 2016-09-30
  Administered 2016-09-30: 20 mg via INTRAVENOUS
  Filled 2016-09-30: qty 2

## 2016-09-30 NOTE — Progress Notes (Signed)
Pt was feeling very weak and SOB, pt has hx of mysathenia gravis, just started on 20mg  of Prednisone(pt's home dose) earlier today and before was only taking 10 mg while in the hospital instead of 20, pt was also having crackles in the bases, VS were WNL, MD was notified ,MD ordered one dose of IV Lasix 20 mg, RN reassured pt he will get another 20 mg of Prednisone in the morning, after getting the Lasix, pt felt better about his situation, will continue to monitor, Thanks Arvella Nigh RN.

## 2016-09-30 NOTE — Progress Notes (Signed)
PT Cancellation Note  Patient Details Name: Richard Bradley MRN: [id number] DOB: 1925/08/20   Cancelled Treatment:    Reason Eval/Treat Not Completed: Medical issues which prohibited therapy, pt's myasthenia gravis currently flared and being addressed. Pt unable to work with therapy today. Spoke with daughter and caregiver and transfers to [facility] as tolerated and we will follow up tomorrow.    [place] 09/30/2016, 12:43 PM

## 2016-09-30 NOTE — Progress Notes (Signed)
ANTICOAGULATION CONSULT NOTE - Follow Up Consult  Pharmacy Consult for Warfarin Indication: atrial fibrillation  Assessment: 53 yoM admitted 2/23 on warfarin at home for Afib (CHADSVASC = 4). Home dose of warfarin is 5 mg MWF, 2.5 mg all other days (weekly dose of 25 mg). INR was therapeutic at 2.28 on admission.   Today's INR is 3.04, remains therapeutic. Dosing cautiously due to drug-drug interaction with fluconazole and poor po intake. CBC is stable with Hgb improved to 12.6 and no overt bleeding noted.  Continues on day # 7 of fluconazole 400 mg IV q24h which does appear to be increasing effect of warfarin. We are giving lower doses of warfarin as compared to his PTA dose and warfarin held x 2 days 3/1-3/2.  Today INR bumped up slightly above 3 after 1mg  daily x 2 days I will opt to hold warfarin today.  May only need every other day dosing of warfarin while on fluconazole.  09/24/16: Bilateral LE dopplers negative for DVT  Goal of Therapy:  INR 2-3 Monitor platelets by anticoagulation protocol: Yes   Plan:  No warfarin tonight Monitor daily INR, CBC, po intake, s/sx bleeding   Allergies  Allergen Reactions  . Tape Other (See Comments)    PATIENT'S SKIN IS VERY THIN AND WILL TEAR AND BRUISE VERY EASILY!!    Patient Measurements: Height: 6\' 3"  (190.5 cm) Weight: 172 lb 12.8 oz (78.4 kg) (Pt weak. Unable to stand at this time.) IBW/kg (Calculated) : 84.5  Vital Signs: Temp: 97.9 F (36.6 C) (03/05 0537) Temp Source: Oral (03/05 0537) BP: 124/81 (03/05 0537) Pulse Rate: 60 (03/05 0537)  Labs:  Recent Labs  09/28/16 0502 09/29/16 0358 09/30/16 0458  HGB  --   --  12.6*  HCT  --   --  40.2  PLT  --   --  259  LABPROT 32.2* 30.3* 32.2*  INR 3.05 2.83 3.04  CREATININE 0.81 0.76 0.75    Estimated Creatinine Clearance: 70.8 mL/min (by C-G formula based on SCr of 0.75 mg/dL).  Medications:  Scheduled:  . digoxin  0.0625 mg Oral Daily  . escitalopram  5 mg Oral QHS   . feeding supplement (ENSURE ENLIVE)  237 mL Oral BID BM  . fluconazole (DIFLUCAN) IV  400 mg Intravenous Q24H  . fluticasone  1 spray Each Nare BID  . furosemide  40 mg Intravenous BID  . hydrALAZINE  5 mg Oral Q6H  . mouth rinse  15 mL Mouth Rinse BID  . meropenem (MERREM) IV  2 g Intravenous Q8H  . metoprolol tartrate  25 mg Oral BID  . pantoprazole  40 mg Oral Daily  . polycarbophil  625 mg Oral TID  . polyethylene glycol  17 g Oral QHS  . [START ON 10/01/2016] predniSONE  40 mg Oral Q breakfast  . Warfarin - Pharmacist Dosing Inpatient   Does not apply q1800    Nicole Cella, RPh Clinical Pharmacist Pager: [phone] [facility] #[id number] 09/30/2016 11:05 AM

## 2016-09-30 NOTE — Progress Notes (Signed)
[facility]   Reason for visit: Follow up on Candidemia  Interval History: unable to do TEE today due to pulmonary edema, repeat blood cultures remain negative, no fever,  Physical Exam: Constitutional:  Vitals:   09/30/16 1109 09/30/16 1200  BP: 122/81 99/66  Pulse: 85 80  Resp:    Temp:  97.2 F (36.2 C)   patient appears in NAD   Review of Systems: Constitutional: negative for chills  Lab Results  Component Value Date   WBC 11.6 (H) 09/30/2016   HGB 12.6 (L) 09/30/2016   HCT 40.2 09/30/2016   MCV 102.0 (H) 09/30/2016   PLT 259 09/30/2016    Lab Results  Component Value Date   CREATININE 0.75 09/30/2016   BUN 9 09/30/2016   NA 140 09/30/2016   K 4.0 09/30/2016   CL 99 (L) 09/30/2016   CO2 36 (H) 09/30/2016    Lab Results  Component Value Date   ALT 14 (L) 09/20/2016   AST 20 09/20/2016   ALKPHOS 56 09/20/2016     Microbiology: Recent Results (from the past 240 hour(s))  Blood culture (routine x 2)     Status: None   Collection Time: 09/20/16  9:45 PM  Result Value Ref Range Status   Specimen Description BLOOD RIGHT FOREARM  Final   Special Requests BOTTLES DRAWN AEROBIC AND ANAEROBIC 5ML  Final   Culture NO GROWTH 5 DAYS  Final   Report Status 09/25/2016 FINAL  Final  Blood culture (routine x 2)     Status: Abnormal   Collection Time: 09/20/16 10:05 PM  Result Value Ref Range Status   Specimen Description BLOOD RIGHT HAND  Final   Special Requests IN PEDIATRIC BOTTLE 2ML  Final   Culture  Setup Time   Final    GRAM NEGATIVE RODS IN PEDIATRIC BOTTLE CRITICAL RESULT CALLED TO, READ BACK BY AND VERIFIED WITH: A. JOHNSTON, PHARM, 09/21/16 AT 1733 BY J FUDESCO    Culture PSEUDOMONAS AERUGINOSA (A)  Final   Report Status 09/23/2016 FINAL  Final   Organism ID, Bacteria PSEUDOMONAS AERUGINOSA  Final      Susceptibility   Pseudomonas aeruginosa - MIC*    CEFTAZIDIME 16 INTERMEDIATE Intermediate     CIPROFLOXACIN 1 SENSITIVE  Sensitive     GENTAMICIN <=1 SENSITIVE Sensitive     IMIPENEM 2 SENSITIVE Sensitive     PIP/TAZO 32 SENSITIVE Sensitive     CEFEPIME INTERMEDIATE Intermediate     * PSEUDOMONAS AERUGINOSA  Blood Culture ID Panel (Reflexed)     Status: Abnormal   Collection Time: 09/20/16 10:05 PM  Result Value Ref Range Status   Enterococcus species NOT DETECTED NOT DETECTED Final   Listeria monocytogenes NOT DETECTED NOT DETECTED Final   Staphylococcus species NOT DETECTED NOT DETECTED Final   Staphylococcus aureus NOT DETECTED NOT DETECTED Final   Streptococcus species NOT DETECTED NOT DETECTED Final   Streptococcus agalactiae NOT DETECTED NOT DETECTED Final   Streptococcus pneumoniae NOT DETECTED NOT DETECTED Final   Streptococcus pyogenes NOT DETECTED NOT DETECTED Final   Acinetobacter baumannii NOT DETECTED NOT DETECTED Final   Enterobacteriaceae species NOT DETECTED NOT DETECTED Final   Enterobacter cloacae complex NOT DETECTED NOT DETECTED Final   Escherichia coli NOT DETECTED NOT DETECTED Final   Klebsiella oxytoca NOT DETECTED NOT DETECTED Final   Klebsiella pneumoniae NOT DETECTED NOT DETECTED Final   Proteus species NOT DETECTED NOT DETECTED Final   Serratia marcescens NOT DETECTED NOT DETECTED  Final   Carbapenem resistance NOT DETECTED NOT DETECTED Final   Haemophilus influenzae NOT DETECTED NOT DETECTED Final   Neisseria meningitidis NOT DETECTED NOT DETECTED Final   Pseudomonas aeruginosa DETECTED (A) NOT DETECTED Final    Comment: CRITICAL RESULT CALLED TO, READ BACK BY AND VERIFIED WITH: A. JOHNSTON, PHARM, 09/21/16 AT 1733 BY J FUDESCO    Candida albicans NOT DETECTED NOT DETECTED Final   Candida glabrata NOT DETECTED NOT DETECTED Final   Candida krusei NOT DETECTED NOT DETECTED Final   Candida parapsilosis NOT DETECTED NOT DETECTED Final   Candida tropicalis NOT DETECTED NOT DETECTED Final  Urine culture     Status: Abnormal   Collection Time: 09/21/16  3:08 PM  Result Value  Ref Range Status   Specimen Description URINE, RANDOM  Final   Special Requests NONE  Final   Culture <10,000 COLONIES/mL INSIGNIFICANT GROWTH (A)  Final   Report Status 09/22/2016 FINAL  Final  Culture, blood (routine x 2)     Status: None   Collection Time: 09/22/16  4:28 AM  Result Value Ref Range Status   Specimen Description BLOOD RIGHT HAND  Final   Special Requests BOTTLES DRAWN AEROBIC ONLY 5CC  Final   Culture NO GROWTH 5 DAYS  Final   Report Status 09/27/2016 FINAL  Final  Culture, blood (routine x 2)     Status: Abnormal   Collection Time: 09/22/16  4:34 AM  Result Value Ref Range Status   Specimen Description BLOOD LEFT HAND  Final   Special Requests BOTTLES DRAWN AEROBIC ONLY 5CC  Final   Culture  Setup Time   Final    YEAST AEROBIC BOTTLE ONLY CRITICAL RESULT CALLED TO, READ BACK BY AND VERIFIED WITH: PHARMD Karlene Einstein [id number] MLM    Culture CANDIDA ALBICANS (A)  Final   Report Status 09/25/2016 FINAL  Final  Blood Culture ID Panel (Reflexed)     Status: Abnormal   Collection Time: 09/22/16  4:34 AM  Result Value Ref Range Status   Enterococcus species NOT DETECTED NOT DETECTED Final   Listeria monocytogenes NOT DETECTED NOT DETECTED Final   Staphylococcus species NOT DETECTED NOT DETECTED Final   Staphylococcus aureus NOT DETECTED NOT DETECTED Final   Streptococcus species NOT DETECTED NOT DETECTED Final   Streptococcus agalactiae NOT DETECTED NOT DETECTED Final   Streptococcus pneumoniae NOT DETECTED NOT DETECTED Final   Streptococcus pyogenes NOT DETECTED NOT DETECTED Final   Acinetobacter baumannii NOT DETECTED NOT DETECTED Final   Enterobacteriaceae species NOT DETECTED NOT DETECTED Final   Enterobacter cloacae complex NOT DETECTED NOT DETECTED Final   Escherichia coli NOT DETECTED NOT DETECTED Final   Klebsiella oxytoca NOT DETECTED NOT DETECTED Final   Klebsiella pneumoniae NOT DETECTED NOT DETECTED Final   Proteus species NOT DETECTED NOT DETECTED  Final   Serratia marcescens NOT DETECTED NOT DETECTED Final   Haemophilus influenzae NOT DETECTED NOT DETECTED Final   Neisseria meningitidis NOT DETECTED NOT DETECTED Final   Pseudomonas aeruginosa NOT DETECTED NOT DETECTED Final   Candida albicans DETECTED (A) NOT DETECTED Final    Comment: CRITICAL RESULT CALLED TO, READ BACK BY AND VERIFIED WITH: PHARMD N BATCHELDER [id number] MLM    Candida glabrata NOT DETECTED NOT DETECTED Final   Candida krusei NOT DETECTED NOT DETECTED Final   Candida parapsilosis NOT DETECTED NOT DETECTED Final   Candida tropicalis NOT DETECTED NOT DETECTED Final  Culture, blood (routine x 2)     Status: None  Collection Time: 09/24/16  4:44 PM  Result Value Ref Range Status   Specimen Description BLOOD RIGHT HAND  Final   Special Requests IN PEDIATRIC BOTTLE 1ML  Final   Culture NO GROWTH 5 DAYS  Final   Report Status 09/29/2016 FINAL  Final  Culture, blood (routine x 2)     Status: None   Collection Time: 09/24/16  5:03 PM  Result Value Ref Range Status   Specimen Description BLOOD LEFT HAND  Final   Special Requests IN PEDIATRIC BOTTLE 1CC  Final   Culture NO GROWTH 5 DAYS  Final   Report Status 09/29/2016 FINAL  Final    Impression/Plan:  1. Candidemia - unable to do TEE and I agree that with his continued pulmonary edema, would not be optimal to attempt, so will treat for 6 weeks with oral fluconazole 400 mg orally once a day through April 9th.  He will need an outpatient eye exam to rule out endophthalmitis in the next week He will get a follow up TTE by cardiology at their discretion.   Discussed with Dr. Burt Knack.    2. Pseudomonal bacteremia - has been on 10 days of treatment.  I will stop the meropenem now.    I will sign off, please call with any questions. thanks

## 2016-09-30 NOTE — Progress Notes (Addendum)
[facility]                                                                              Patient Demographics  Richard Bradley, is a 83 y.o. male, DOB - 09-23-1925, WNI:[id number]  Admit date - 09/20/2016   Admitting Physician [name], MD  Outpatient Primary MD for the patient is [name], MD  Outpatient specialists:   LOS - 10  days    Chief Complaint  Patient presents with  . Nausea  . Fever  . flu like symptoms       Brief summary   83 y.o.malewith medical history significant for chronic atrial fibrillation on AC with coumadin, myasthenia gravis, prostate cancer, basal cell CA, recently underwent a left TMTamputation and is coming to the emergency department with nausea, dry heaves and chills since the morning of the admission.  On admission. BP was 90/48 and has improved with IV fluids to 159/109. HR was 121, RR 18-33, T max was 101.22F, oxygen saturation was 86% on room air but has improved to 95% with [place] oxygen support. Blood work was notable for WBC count 19.5, hemoglobin 12.1, potassium 2.8 which was supplemented. Lactic acid was 1.98. CXR showed small bilateral effusions but no focal consolidation. UA showed rare bacteria, no leukocytes. He was started on empiric broad spectrum abx until blood and urine cx results are back.  I assumed care on 09/26/16  Assessment & Plan    Principal problem Acute hypoxic respiratory failure/pulmonary edema and due to acute on chronic systolic CHF exacerbation - Likely due to volume overload, patient's Lasix was held through the hospitalization.  -  2-D echo 2/27 showed EF of 35-40% with diffuse hypokinesis, massive bilateral enlargement, reduced RV systolic function, moderate TR, findings consistent with dilated cardiomyopathy - Chest x-ray 3/4 showed improving bilateral pleural effusions - Continue Lasix 40 mg q12hrs, negative balance of 3.1 L, respiratory status still tenuous to undergo TEE. Also  placed on IV Solu-Medrol for stress dose steroid as patient had received low-dose of prednisone for his myasthenia gravis.  Active Problems:   Sepsis due to pseudomonas bacteremia and Candida Albicans now, source unclear at this time, RLE cellulitis  - Sepsis criteria met on admission with fever, tachycardia, tachypnea, leukocytosis, lactic acidosis - Blood cx on admission was positive for Pseudomonas.   -Initially started on vancomycin and Zosyn, ID was consulted, recommended meropenem. - Repeat blood cultures 2/25 showed Candida albicans. Started on Diflucan IV. Source unclear - 2-D echo showed EF 35-40%, no vegetations. - TEE currently on hold until respiratory status to more stable, tentatively planned today however currently on hold due to respiratory status - Repeat blood cultures 2/27 has been negative    RLE cellulitis/Left transmetatarsal amputation on 09/11/16 - current ABX should be adequate in coverage  - LE dopplers negative for DVT - seen by Dr Sharol Given- he can follow up in 2 weeks in his clinic.     Chronic atrial fibrillation (HCC) with RVR - CHADS vasc score 4 - On Anticoagulation with coumadin - Continue rate control with metoprolol - Cardiology consulted    Myasthenia gravis ([place]) -  On chronic steroids, patient is supposed to be on prednisone 20 mg daily however was receiving a lower dose - Gave Solu-Medrol 60 mg IV x 1 today, will place him on prednisone 40 mg for next 3 days, then resumed regular outpatient dose of 20 mg daily     Amputated toe, left (HCC) - Stable     Benign essential HTN - Continue metoprolol    Code Status: Partial code, no intubation DVT Prophylaxis:  Coumadin Family Communication: Discussed in detail with the patient, all imaging results, lab results explained to the patient and daughter at the bedside  Disposition Plan:   Time Spent in minutes  25 minutes  Procedures:  echo  Consultants:   Cardiology  ID   Anti microbials:     Vanco and zosyn 09/20/2016   Now on Meropenem and DiFlucan   Medications  Scheduled Meds: . digoxin  0.0625 mg Oral Daily  . escitalopram  5 mg Oral QHS  . feeding supplement (ENSURE ENLIVE)  237 mL Oral BID BM  . fluconazole (DIFLUCAN) IV  400 mg Intravenous Q24H  . fluticasone  1 spray Each Nare BID  . furosemide  40 mg Intravenous BID  . hydrALAZINE  10 mg Oral Q6H  . mouth rinse  15 mL Mouth Rinse BID  . meropenem (MERREM) IV  2 g Intravenous Q8H  . metoprolol tartrate  25 mg Oral BID  . pantoprazole  40 mg Oral Daily  . polycarbophil  625 mg Oral TID  . polyethylene glycol  17 g Oral QHS  . [START ON 10/01/2016] predniSONE  40 mg Oral Q breakfast  . Warfarin - Pharmacist Dosing Inpatient   Does not apply q1800   Continuous Infusions: . sodium chloride 20 mL/hr (09/29/16 1735)   PRN Meds:.acetaminophen, guaiFENesin, HYDROcodone-acetaminophen, ipratropium, ondansetron **OR** ondansetron (ZOFRAN) IV   Antibiotics   Anti-infectives    Start     Dose/Rate Route Frequency Ordered Stop   09/25/16 0900  fluconazole (DIFLUCAN) IVPB 400 mg     400 mg 100 mL/hr over 120 Minutes Intravenous Every 24 hours 09/24/16 0832     09/24/16 0900  anidulafungin (ERAXIS) 200 mg in sodium chloride 0.9 % 200 mL IVPB  Status:  Discontinued     200 mg over 180 Minutes Intravenous Every 24 hours 09/24/16 0817 09/24/16 0832   09/24/16 0900  meropenem (MERREM) 2 g in sodium chloride 0.9 % 100 mL IVPB     2 g 200 mL/hr over 30 Minutes Intravenous Every 8 hours 09/24/16 0827     09/24/16 0900  fluconazole (DIFLUCAN) IVPB 800 mg     800 mg 200 mL/hr over 120 Minutes Intravenous  Once 09/24/16 0832 09/24/16 1504   09/23/16 1800  ciprofloxacin (CIPRO) tablet 750 mg  Status:  Discontinued     750 mg Oral 2 times daily 09/23/16 1330 09/24/16 0816   09/22/16 2300  vancomycin (VANCOCIN) 1,250 mg in sodium chloride 0.9 % 250 mL IVPB  Status:  Discontinued     1,250 mg 166.7 mL/hr over 90 Minutes  Intravenous Every 12 hours 09/22/16 1227 09/23/16 0851   09/21/16 0800  piperacillin-tazobactam (ZOSYN) IVPB 3.375 g  Status:  Discontinued     3.375 g 12.5 mL/hr over 240 Minutes Intravenous Every 8 hours 09/21/16 0733 09/23/16 1330   09/21/16 0730  piperacillin-tazobactam (ZOSYN) IVPB 3.375 g  Status:  Discontinued     3.375 g 100 mL/hr over 30 Minutes Intravenous Every 8 hours 09/21/16 0724 09/21/16 0730  09/20/16 2300  vancomycin (VANCOCIN) IVPB 1000 mg/200 mL premix  Status:  Discontinued     1,000 mg 200 mL/hr over 60 Minutes Intravenous Every 12 hours 09/20/16 2247 09/22/16 1227   09/20/16 2145  cefTRIAXone (ROCEPHIN) 1 g in dextrose 5 % 50 mL IVPB  Status:  Discontinued     1 g 100 mL/hr over 30 Minutes Intravenous  Once 09/20/16 2130 09/20/16 2132   09/20/16 2145  piperacillin-tazobactam (ZOSYN) IVPB 3.375 g     3.375 g 100 mL/hr over 30 Minutes Intravenous  Once 09/20/16 2132 09/20/16 2318        Subjective:   Richard Bradley was seen and examined today. Still short of breath, per daughter, very weak and possibly apical to wean down his O2 due to his myasthenia gravis.. Denies any nausea or vomiting, no fevers or chills. Patient denies dizziness, abdominal pain, N/V/D/C, new weakness, numbess, tingling.   Objective:   Vitals:   09/30/16 0300 09/30/16 0307 09/30/16 0537 09/30/16 1109  BP: (!) 140/105 (!) 129/100 124/81 122/81  Pulse: 95 100 60 85  Resp: (!) 22  20   Temp:   97.9 F (36.6 C)   TempSrc:   Oral   SpO2: 98%  95%   Weight:   78.4 kg (172 lb 12.8 oz)   Height:        Intake/Output Summary (Last 24 hours) at 09/30/16 1157 Last data filed at 09/30/16 1025  Gross per 24 hour  Intake          1489.33 ml  Output             2175 ml  Net          -685.67 ml     Wt Readings from Last 3 Encounters:  09/30/16 78.4 kg (172 lb 12.8 oz)  09/11/16 81.6 kg (180 lb)  08/12/16 81.2 kg (179 lb)     Exam  General: Alert and oriented x 3, NAD  HEENT:     Neck: Supple, + JVD  Cardiovascular: S1 S2 auscultated, Irregularly irregular, tachycardia   Respiratory: Bibasilar crackles  Gastrointestinal: Soft, nontender, nondistended, + bowel sounds  Ext: no cyanosis clubbing. RLE 1+ edema. TMT amputation left  Neuro: No new deficits  Skin: No rashes  Psych: Normal affect and demeanor, alert and oriented x3    Data Reviewed:  I have personally reviewed following labs and imaging studies  Micro Results Recent Results (from the past 240 hour(s))  Blood culture (routine x 2)     Status: None   Collection Time: 09/20/16  9:45 PM  Result Value Ref Range Status   Specimen Description BLOOD RIGHT FOREARM  Final   Special Requests BOTTLES DRAWN AEROBIC AND ANAEROBIC 5ML  Final   Culture NO GROWTH 5 DAYS  Final   Report Status 09/25/2016 FINAL  Final  Blood culture (routine x 2)     Status: Abnormal   Collection Time: 09/20/16 10:05 PM  Result Value Ref Range Status   Specimen Description BLOOD RIGHT HAND  Final   Special Requests IN PEDIATRIC BOTTLE 2ML  Final   Culture  Setup Time   Final    GRAM NEGATIVE RODS IN PEDIATRIC BOTTLE CRITICAL RESULT CALLED TO, READ BACK BY AND VERIFIED WITH: A. JOHNSTON, PHARM, 09/21/16 AT 1733 BY J FUDESCO    Culture PSEUDOMONAS AERUGINOSA (A)  Final   Report Status 09/23/2016 FINAL  Final   Organism ID, Bacteria PSEUDOMONAS AERUGINOSA  Final  Susceptibility   Pseudomonas aeruginosa - MIC*    CEFTAZIDIME 16 INTERMEDIATE Intermediate     CIPROFLOXACIN 1 SENSITIVE Sensitive     GENTAMICIN <=1 SENSITIVE Sensitive     IMIPENEM 2 SENSITIVE Sensitive     PIP/TAZO 32 SENSITIVE Sensitive     CEFEPIME INTERMEDIATE Intermediate     * PSEUDOMONAS AERUGINOSA  Blood Culture ID Panel (Reflexed)     Status: Abnormal   Collection Time: 09/20/16 10:05 PM  Result Value Ref Range Status   Enterococcus species NOT DETECTED NOT DETECTED Final   Listeria monocytogenes NOT DETECTED NOT DETECTED Final    Staphylococcus species NOT DETECTED NOT DETECTED Final   Staphylococcus aureus NOT DETECTED NOT DETECTED Final   Streptococcus species NOT DETECTED NOT DETECTED Final   Streptococcus agalactiae NOT DETECTED NOT DETECTED Final   Streptococcus pneumoniae NOT DETECTED NOT DETECTED Final   Streptococcus pyogenes NOT DETECTED NOT DETECTED Final   Acinetobacter baumannii NOT DETECTED NOT DETECTED Final   Enterobacteriaceae species NOT DETECTED NOT DETECTED Final   Enterobacter cloacae complex NOT DETECTED NOT DETECTED Final   Escherichia coli NOT DETECTED NOT DETECTED Final   Klebsiella oxytoca NOT DETECTED NOT DETECTED Final   Klebsiella pneumoniae NOT DETECTED NOT DETECTED Final   Proteus species NOT DETECTED NOT DETECTED Final   Serratia marcescens NOT DETECTED NOT DETECTED Final   Carbapenem resistance NOT DETECTED NOT DETECTED Final   Haemophilus influenzae NOT DETECTED NOT DETECTED Final   Neisseria meningitidis NOT DETECTED NOT DETECTED Final   Pseudomonas aeruginosa DETECTED (A) NOT DETECTED Final    Comment: CRITICAL RESULT CALLED TO, READ BACK BY AND VERIFIED WITH: A. JOHNSTON, PHARM, 09/21/16 AT 1733 BY J FUDESCO    Candida albicans NOT DETECTED NOT DETECTED Final   Candida glabrata NOT DETECTED NOT DETECTED Final   Candida krusei NOT DETECTED NOT DETECTED Final   Candida parapsilosis NOT DETECTED NOT DETECTED Final   Candida tropicalis NOT DETECTED NOT DETECTED Final  Urine culture     Status: Abnormal   Collection Time: 09/21/16  3:08 PM  Result Value Ref Range Status   Specimen Description URINE, RANDOM  Final   Special Requests NONE  Final   Culture <10,000 COLONIES/mL INSIGNIFICANT GROWTH (A)  Final   Report Status 09/22/2016 FINAL  Final  Culture, blood (routine x 2)     Status: None   Collection Time: 09/22/16  4:28 AM  Result Value Ref Range Status   Specimen Description BLOOD RIGHT HAND  Final   Special Requests BOTTLES DRAWN AEROBIC ONLY 5CC  Final   Culture NO  GROWTH 5 DAYS  Final   Report Status 09/27/2016 FINAL  Final  Culture, blood (routine x 2)     Status: Abnormal   Collection Time: 09/22/16  4:34 AM  Result Value Ref Range Status   Specimen Description BLOOD LEFT HAND  Final   Special Requests BOTTLES DRAWN AEROBIC ONLY 5CC  Final   Culture  Setup Time   Final    YEAST AEROBIC BOTTLE ONLY CRITICAL RESULT CALLED TO, READ BACK BY AND VERIFIED WITH: Sandria Bales [id number] [phone] MLM    Culture CANDIDA ALBICANS (A)  Final   Report Status 09/25/2016 FINAL  Final  Blood Culture ID Panel (Reflexed)     Status: Abnormal   Collection Time: 09/22/16  4:34 AM  Result Value Ref Range Status   Enterococcus species NOT DETECTED NOT DETECTED Final   Listeria monocytogenes NOT DETECTED NOT DETECTED Final   Staphylococcus species NOT  DETECTED NOT DETECTED Final   Staphylococcus aureus NOT DETECTED NOT DETECTED Final   Streptococcus species NOT DETECTED NOT DETECTED Final   Streptococcus agalactiae NOT DETECTED NOT DETECTED Final   Streptococcus pneumoniae NOT DETECTED NOT DETECTED Final   Streptococcus pyogenes NOT DETECTED NOT DETECTED Final   Acinetobacter baumannii NOT DETECTED NOT DETECTED Final   Enterobacteriaceae species NOT DETECTED NOT DETECTED Final   Enterobacter cloacae complex NOT DETECTED NOT DETECTED Final   Escherichia coli NOT DETECTED NOT DETECTED Final   Klebsiella oxytoca NOT DETECTED NOT DETECTED Final   Klebsiella pneumoniae NOT DETECTED NOT DETECTED Final   Proteus species NOT DETECTED NOT DETECTED Final   Serratia marcescens NOT DETECTED NOT DETECTED Final   Haemophilus influenzae NOT DETECTED NOT DETECTED Final   Neisseria meningitidis NOT DETECTED NOT DETECTED Final   Pseudomonas aeruginosa NOT DETECTED NOT DETECTED Final   Candida albicans DETECTED (A) NOT DETECTED Final    Comment: CRITICAL RESULT CALLED TO, READ BACK BY AND VERIFIED WITH: PHARMD N BATCHELDER [id number] MLM    Candida glabrata NOT DETECTED NOT  DETECTED Final   Candida krusei NOT DETECTED NOT DETECTED Final   Candida parapsilosis NOT DETECTED NOT DETECTED Final   Candida tropicalis NOT DETECTED NOT DETECTED Final  Culture, blood (routine x 2)     Status: None   Collection Time: 09/24/16  4:44 PM  Result Value Ref Range Status   Specimen Description BLOOD RIGHT HAND  Final   Special Requests IN PEDIATRIC BOTTLE 1ML  Final   Culture NO GROWTH 5 DAYS  Final   Report Status 09/29/2016 FINAL  Final  Culture, blood (routine x 2)     Status: None   Collection Time: 09/24/16  5:03 PM  Result Value Ref Range Status   Specimen Description BLOOD LEFT HAND  Final   Special Requests IN PEDIATRIC BOTTLE Cowlic  Final   Culture NO GROWTH 5 DAYS  Final   Report Status 09/29/2016 FINAL  Final    Radiology Reports Dg Chest 2 View  Result Date: 09/29/2016 CLINICAL DATA:  Pleural effusion. EXAM: CHEST  2 VIEW COMPARISON:  Radiograph September 26, 2016. FINDINGS: Stable cardiomegaly. Atherosclerosis thoracic aorta is noted. No pneumothorax is noted. Continued presence of bibasilar edema or atelectasis with associated pleural effusions. Calcified pleural plaques are again noted bilaterally. Bony thorax is unremarkable. IMPRESSION: Aortic atherosclerosis. Stable bibasilar opacities as described above. Electronically Signed   By: Marijo Conception, M.D.   On: 09/29/2016 08:15   Dg Chest 2 View  Result Date: 09/20/2016 CLINICAL DATA:  Nausea chills and dry heaves EXAM: CHEST  2 VIEW COMPARISON:  06/14/2016 FINDINGS: Mildly low lung volumes. Extensive calcified pleural plaques. Small bilateral effusions. No focal consolidation. Stable moderate cardiomegaly. No pneumothorax. Atherosclerosis. Previous vertebral augmentation of the thoracolumbar spine. Moderate compression of a mid thoracic vertebra. IMPRESSION: 1. Small bilateral effusions.  No focal consolidation. 2. Stable moderate cardiomegaly without overt edema 3. Extensive calcified pleural plaques  Electronically Signed   By: Donavan Foil M.D.   On: 09/20/2016 21:19   Dg Chest Port 1 View  Result Date: 09/26/2016 CLINICAL DATA:  Dyspnea. EXAM: PORTABLE CHEST 1 VIEW COMPARISON:  Radiographs of September 20, 2016. FINDINGS: Stable cardiomegaly. Atherosclerosis of thoracic aorta is noted. No pneumothorax is noted. Moderate bibasilar opacities are noted concerning for edema or atelectasis with associated pleural effusions. 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 Bony thorax is unremarkable. IMPRESSION: Aortic atherosclerosis. Stable cardiomegaly. Moderate bibasilar opacities  are noted concerning for edema or atelectasis with associated pleural effusions. Electronically Signed   By: Marijo Conception, M.D.   On: 09/26/2016 11:47    Lab Data:  CBC:  Recent Labs Lab 09/24/16 0823 09/25/16 0337 09/26/16 0357 09/27/16 0019 09/30/16 0458  WBC 12.3* 10.3 10.2 10.3 11.6*  HGB 10.6* 10.6* 11.0* 10.7* 12.6*  HCT 32.6* 33.7* 35.0* 34.3* 40.2  MCV 100.9* 101.5* 102.3* 102.7* 102.0*  PLT 182 207 217 206 161   Basic Metabolic Panel:  Recent Labs Lab 09/24/16 0823 09/25/16 0337 09/26/16 0357 09/27/16 0019 09/28/16 0502 09/29/16 0358 09/30/16 0458  NA 137 141 140 142 141 140 140  K 2.9* 3.7 3.3* 4.1 4.0 4.0 4.0  CL 99* 103 101 99* 93* 96* 99*  CO2 30 32 33* 35* 37* 38* 36*  GLUCOSE 97 102* 96 140* 94 100* 105*  BUN 9 8 7 9 11 9 9   CREATININE 0.71 0.72 0.78 0.83 0.81 0.76 0.75  CALCIUM 8.2* 8.4* 8.2* 8.4* 8.6* 8.8* 8.7*  MG 1.9 2.0 2.0 1.9  --   --   --    GFR: Estimated Creatinine Clearance: 70.8 mL/min (by C-G formula based on SCr of 0.75 mg/dL). Liver Function Tests: No results for input(s): AST, ALT, ALKPHOS, BILITOT, PROT, ALBUMIN in the last 168 hours. No results for input(s): LIPASE, AMYLASE in the last 168 hours. No results for input(s): AMMONIA in the last 168 hours. Coagulation Profile:  Recent Labs Lab 09/26/16 0357  09/27/16 0019 09/28/16 0502 09/29/16 0358 09/30/16 0458  INR 3.16 3.60 3.05 2.83 3.04   Cardiac Enzymes:  Recent Labs Lab 09/26/16 1012 09/26/16 1245 09/26/16 1852 09/27/16 0019  TROPONINI 0.03* 0.03* 0.09* <0.03   BNP (last 3 results) No results for input(s): PROBNP in the last 8760 hours. HbA1C: No results for input(s): HGBA1C in the last 72 hours. CBG:  Recent Labs Lab 09/30/16 0326  GLUCAP 125*   Lipid Profile: No results for input(s): CHOL, HDL, LDLCALC, TRIG, CHOLHDL, LDLDIRECT in the last 72 hours. Thyroid Function Tests: No results for input(s): TSH, T4TOTAL, FREET4, T3FREE, THYROIDAB in the last 72 hours. Anemia Panel: No results for input(s): VITAMINB12, FOLATE, FERRITIN, TIBC, IRON, RETICCTPCT in the last 72 hours. Urine analysis:    Component Value Date/Time   COLORURINE YELLOW 09/20/2016 2024   APPEARANCEUR HAZY (A) 09/20/2016 2024   LABSPEC 1.015 09/20/2016 2024   PHURINE 6.0 09/20/2016 2024   GLUCOSEU NEGATIVE 09/20/2016 2024   HGBUR MODERATE (A) 09/20/2016 2024   BILIRUBINUR NEGATIVE 09/20/2016 [place] NEGATIVE 09/20/2016 2024   PROTEINUR NEGATIVE 09/20/2016 2024   NITRITE NEGATIVE 09/20/2016 2024   LEUKOCYTESUR NEGATIVE 09/20/2016 2024     Haadi Santellan M.D. [facility] 09/30/2016, 11:57 AM  Pager: [phone] Between 7am to 7pm - call Pager - [phone]  After 7pm go to [email or web address] - password [email or web address]  Call night coverage person covering after 7pm

## 2016-09-30 NOTE — H&P (View-Only) (Signed)
Progress Note  Patient Name: Richard Bradley Date of Encounter: 09/28/2016  Primary Cardiologist: Linard Millers  Subjective   Breathing is improved compared to yesterday. TEE scheduled for Monday.  Inpatient Medications    Scheduled Meds: . digoxin  0.0625 mg Oral Daily  . escitalopram  5 mg Oral QHS  . feeding supplement (ENSURE ENLIVE)  237 mL Oral BID BM  . fluconazole (DIFLUCAN) IV  400 mg Intravenous Q24H  . fluticasone  1 spray Each Nare BID  . hydrALAZINE  5 mg Oral Q6H  . mouth rinse  15 mL Mouth Rinse BID  . meropenem (MERREM) IV  2 g Intravenous Q8H  . metoprolol tartrate  25 mg Oral BID  . pantoprazole  40 mg Oral Daily  . polycarbophil  625 mg Oral TID  . polyethylene glycol  17 g Oral QHS  . predniSONE  10 mg Oral q morning - 10a  . warfarin  1 mg Oral ONCE-1800  . Warfarin - Pharmacist Dosing Inpatient   Does not apply q1800   Continuous Infusions:  PRN Meds: acetaminophen, guaiFENesin, HYDROcodone-acetaminophen, ipratropium, ondansetron **OR** ondansetron (ZOFRAN) IV   Vital Signs    Vitals:   09/28/16 0103 09/28/16 0528 09/28/16 1214 09/28/16 1215  BP: 117/82 134/86 118/86   Pulse: 89 (!) 115  (!) 106  Resp: 18 18  19   Temp:  97.7 F (36.5 C)  97.7 F (36.5 C)  TempSrc:  Oral  Oral  SpO2: 93% 96%  92%  Weight:  166 lb 9.6 oz (75.6 kg)    Height:        Intake/Output Summary (Last 24 hours) at 09/28/16 1327 Last data filed at 09/28/16 1217  Gross per 24 hour  Intake             1357 ml  Output             2350 ml  Net             -993 ml   Filed Weights   09/26/16 0631 09/27/16 0500 09/28/16 0528  Weight: 168 lb 12.8 oz (76.6 kg) 172 lb 1.6 oz (78.1 kg) 166 lb 9.6 oz (75.6 kg)    Telemetry    Atrial fibrillation with poor rate control - Personally Reviewed  ECG    Atrial fibrillation with rapid ventricular response. Premature ventricular contractions. No acute ischemic change. - Personally Reviewed  Physical Exam  Elderly, frail, and  in mild distress. GEN: No acute distress.   Neck:  mild JVD Cardiac: IIRR, no murmurs, rubs, or gallops.  Respiratory:  diminished breath sounds diffusely. Rales heard throughout both mid lung fields.Marland Kitchen GI: Soft, nontender, non-distended  MS: No edema; No deformity. Neuro:  Nonfocal  Psych: Normal affect   Labs    Chemistry  Recent Labs Lab 09/26/16 0357 09/27/16 0019 09/28/16 0502  NA 140 142 141  K 3.3* 4.1 4.0  CL 101 99* 93*  CO2 33* 35* 37*  GLUCOSE 96 140* 94  BUN 7 9 11   CREATININE 0.78 0.83 0.81  CALCIUM 8.2* 8.4* 8.6*  GFRNONAA >60 >60 >60  GFRAA >60 >60 >60  ANIONGAP 6 8 11      Hematology  Recent Labs Lab 09/25/16 0337 09/26/16 0357 09/27/16 0019  WBC 10.3 10.2 10.3  RBC 3.32* 3.42* 3.34*  HGB 10.6* 11.0* 10.7*  HCT 33.7* 35.0* 34.3*  MCV 101.5* 102.3* 102.7*  MCH 31.9 32.2 32.0  MCHC 31.5 31.4 31.2  RDW 14.0 14.4 14.4  PLT 207 217 206    Cardiac Enzymes  Recent Labs Lab 09/26/16 1012 09/26/16 1245 09/26/16 1852 09/27/16 0019  TROPONINI 0.03* 0.03* 0.09* <0.03   No results for input(s): TROPIPOC in the last 168 hours.   BNP  Recent Labs Lab 09/26/16 1105  BNP 624.0*     DDimer No results for input(s): DDIMER in the last 168 hours.   Radiology    No results found. The chest x-ray was personally reviewed and looks terrible.- 09/27/16  Cardiac Studies   Echocardiogram, 09/24/26: Study Conclusions  - Left ventricle: The cavity size was severely dilated. Wall   thickness was normal. Systolic function was moderately reduced.   The estimated ejection fraction was in the range of 35% to 40%.   Diffuse hypokinesis. The study is not technically sufficient to   allow evaluation of LV diastolic function. - Aortic valve: Sclerosis without stenosis. There was moderate   regurgitation. - Mitral valve: Mildly thickened leaflets . There was severe   regurgitation directed posteriorly. - Left atrium: Massively dilated (115 ml/m2). -  Right ventricle: The cavity size was mildly dilated. Reduced RV   systolic function. Lateral annulus peak S velocity: 7.62 cm/s. - Right atrium: Massively dilated (90 ml/m2). - Tricuspid valve: There was moderate regurgitation. - Pulmonary arteries: PA peak pressure: 52 mm Hg (S). - Inferior vena cava: The vessel was dilated. The respirophasic   diameter changes were blunted (< 50%), consistent with elevated   central venous pressure. - Pericardium, extracardiac: A trivial pericardial effusion was   identified posterior to the heart. There was a left pleural   effusion.  Recommendations:  LVEF 35-40%, severely dilated LV with global hypokinesis and normal wall thickness, moderate AI, severe posteriorly directed MR, massive biatrial enlargement, mild RVE with reduced RV systolic function, moderate TR, RVSP 52 mmHg, dilated IVC, trivial pericardial effusion with left pleural effusion. Findings consistent with dilated cardiomyopathy.   Patient Profile     83 y.o. male with history of myasthenia gravis, chronic atrial fibrillation, chronic anticoagulation with Coumadin, essential hypertension, pulmonary fibrosis, chronic combined systolic and diastolic heart failure with prior cath greater than 5 years ago without obstructive disease, type 2 diabetes mellitus, peripheral vascular disease, and prior history of PVD with partial left lower extremity amputation. Admitted on this occasion with poly-organism bacteremia. Source of bacteremia has not been identified.  Assessment & Plan    1. Acute on chronic combined systolic and diastolic heart failure. Improvement in respiratory status overnight with diuresis. Continue diuresis over we can with goal to allow transesophageal echo early next week. We'll tentatively attempt to reschedule for Monday. Jaquelyn Sakamoto add out by 1.9 L since admission. We'll continue diuresis.   2. Poly-organism bacteremia: Gram-negative ride and Candida. Plan TEE once heart  failure resolved to exclude the possibility of endocarditis. TEE is set for Monday. New orders Jossie Smoot need to be written on Sunday and patient made nothing by mouth if it appears he is stable enough to undergo the procedure.  3. Chronic atrial fibrillation, Rate control improved and Antwon Rochin likely continue to impove as his overall status improves.  4. Severe mitral regurgitation, attempt afterload reduction with hydralazine as tolerated by blood pressure.If he tolerates hydralazine, consider optimizing and eventually adding long-acting nitrates.  Overall very tenuous situation in this 83 year old gentleman with poly-organism bacteremia, left lower extremity partial amputation, and acute CHF related to volume loading and diuretic with hold on admission. Depending upon course, may need to consider further discussions with family concerning  CODE STATUS.  Signed, Ridgely Anastacio Meredith Leeds, MD  09/28/2016, 1:27 PM

## 2016-09-30 NOTE — Progress Notes (Signed)
Pharmacy Consult Note  Assessment: 83 yo male presenting with nausea, recent TMT amputation. ID: [id number] abx for Sepsis due to pseudomonas bacteremia and Candida Albicans now, source unclear at this time, RLE cellulitis.  Afeb, WBC 11.6, SCr 0.76 stable. Repeat blood cultures are negative.  ID wanting TEE if HF stable-- TEE to be done today 3/5  Antimicrobials this admission: Zosyn 2/23>>2/26 Vanc  2/23>>2/26  Cipro 2/26>>2/27 Merrem 2/27>> (treat for 14d, 2/24 day 1)>>FCZ 2/27>>  Dose adjustments this admission: 2/25 VT = 12 - increase to 1250 q12h (may be conservative but 83 yo)  Microbiology results: 2/23 BCx x2: pseudomonas (I to cefepime/fortaz, s to FQ, primaxin, zosyn) 2/23 BC x1: neg 2/24 UCx: insig growth 2/25 BCx2: candida 2/25 BCx1: neg 2/27 BCx2: neg   Plan:  No changes in Meropenem 2 g IV q8h  Fluconazole continues per MD dosing at 400 mg IV q24h Follow culture data See previous note regarding coumadin dosing per pharmacy protocol.  Consider checking digoxin level next week with possible DI with fluconazole  Allergies  Allergen Reactions  . Tape Other (See Comments)    PATIENT'S SKIN IS VERY THIN AND WILL TEAR AND BRUISE VERY EASILY!!    Patient Measurements: Height: 6\' 3"  (190.5 cm) Weight: 172 lb 12.8 oz (78.4 kg) (Pt weak. Unable to stand at this time.) IBW/kg (Calculated) : 84.5   Vital Signs: Temp: 97.9 F (36.6 C) (03/05 0537) Temp Source: Oral (03/05 0537) BP: 122/81 (03/05 1109) Pulse Rate: 85 (03/05 1109)  Labs:  Recent Labs  09/28/16 0502 09/29/16 0358 09/30/16 0458  HGB  --   --  12.6*  HCT  --   --  40.2  PLT  --   --  259  LABPROT 32.2* 30.3* 32.2*  INR 3.05 2.83 3.04  CREATININE 0.81 0.76 0.75    Estimated Creatinine Clearance: 70.8 mL/min (by C-G formula based on SCr of 0.75 mg/dL).  Nicole Cella, RPh Clinical Pharmacist Pager: [phone]-4P [phone]-10P [phone] 09/30/2016 11:25 AM

## 2016-09-30 NOTE — Interval H&P Note (Signed)
History and Physical Interval Note:  09/30/2016 8:10 AM  Richard Bradley  has presented today for surgery, with the diagnosis of bacteremia  The various methods of treatment have been discussed with the patient and family. After consideration of risks, benefits and other options for treatment, the patient has consented to  Procedure(s): TRANSESOPHAGEAL ECHOCARDIOGRAM (TEE) (N/A) as a surgical intervention .  The patient's history has been reviewed, patient examined, no change in status, stable for surgery.  I have reviewed the patient's chart and labs.  Questions were answered to the patient's satisfaction.     Skeet Latch, MD 09/30/2016 8:10 AM

## 2016-09-30 NOTE — Progress Notes (Signed)
Progress Note  Patient Name: Richard Bradley Date of Encounter: 09/30/2016  Primary Cardiologist: Dr Tamala Julian  Patient Profile     83 y.o. male w/ hx hx myasthenia gravis, chronic afib, chronic anticoagulation with Coumadin, HTN, pulmonary fibrosis, S-D-CHF, cath greater than 5 years ago without obstructive disease, DM2, hx PVD with partial left lower extremity amputation. Admitted 02/23 w/ poly-organism bacteremia. Source of bacteremia has not been identified. TEE tentatively 03/05  Subjective   Pt resp more labored today, speech is slurred, he is very weak.   Inpatient Medications    Scheduled Meds: . digoxin  0.0625 mg Oral Daily  . escitalopram  5 mg Oral QHS  . feeding supplement (ENSURE ENLIVE)  237 mL Oral BID BM  . fluconazole (DIFLUCAN) IV  400 mg Intravenous Q24H  . fluticasone  1 spray Each Nare BID  . furosemide  40 mg Intravenous BID  . hydrALAZINE  5 mg Oral Q6H  . mouth rinse  15 mL Mouth Rinse BID  . meropenem (MERREM) IV  2 g Intravenous Q8H  . metoprolol tartrate  25 mg Oral BID  . pantoprazole  40 mg Oral Daily  . polycarbophil  625 mg Oral TID  . polyethylene glycol  17 g Oral QHS  . [START ON 10/01/2016] predniSONE  40 mg Oral Q breakfast  . Warfarin - Pharmacist Dosing Inpatient   Does not apply q1800   Continuous Infusions: . sodium chloride 20 mL/hr (09/29/16 1735)   PRN Meds: acetaminophen, guaiFENesin, HYDROcodone-acetaminophen, ipratropium, ondansetron **OR** ondansetron (ZOFRAN) IV   Vital Signs    Vitals:   09/29/16 2356 09/30/16 0300 09/30/16 0307 09/30/16 0537  BP: 128/83 (!) 140/105 (!) 129/100 124/81  Pulse: (!) 113 95 100 60  Resp:  (!) 22  20  Temp:    97.9 F (36.6 C)  TempSrc:    Oral  SpO2:  98%  95%  Weight:    172 lb 12.8 oz (78.4 kg)  Height:        Intake/Output Summary (Last 24 hours) at 09/30/16 1043 Last data filed at 09/30/16 [id number]  Gross per 24 hour  Intake          1489.33 ml  Output             1775 ml  Net           -285.67 ml   Filed Weights   09/28/16 0528 09/29/16 0527 09/30/16 0537  Weight: 166 lb 9.6 oz (75.6 kg) 165 lb 12.8 oz (75.2 kg) 172 lb 12.8 oz (78.4 kg)    Telemetry    Atrial fib, RVR frequently - Personally Reviewed  ECG    N/a - Personally Reviewed  Physical Exam   General: Well developed, well nourished, male appearing in moderate distress. Head: Normocephalic, atraumatic.  Neck: Supple without bruits, JVD 9 cm. Lungs:  Resp regular and unlabored, bibasilar rales. Heart: Irreg R&R, S1, S2, no S3, S4, soft murmur; no rub. Abdomen: Soft, non-tender, non-distended with normoactive bowel sounds. No hepatomegaly. No rebound/guarding. No obvious abdominal masses. Extremities: No clubbing, cyanosis, no edema. Distal pedal pulses are 2+ bilaterally. Fluid collection R calf w/ IM managing, s/p L partial foot amputation Neuro: moderately Alert and oriented X 3. Moves all extremities spontaneously. Psych: Normal affect.  Labs    Hematology Recent Labs Lab 09/26/16 0357 09/27/16 0019 09/30/16 0458  WBC 10.2 10.3 11.6*  RBC 3.42* 3.34* 3.94*  HGB 11.0* 10.7* 12.6*  HCT 35.0* 34.3* 40.2  MCV 102.3* 102.7* 102.0*  MCH 32.2 32.0 32.0  MCHC 31.4 31.2 31.3  RDW 14.4 14.4 14.4  PLT 217 206 259    Chemistry Recent Labs Lab 09/28/16 0502 09/29/16 0358 09/30/16 0458  NA 141 140 140  K 4.0 4.0 4.0  CL 93* 96* 99*  CO2 37* 38* 36*  GLUCOSE 94 100* 105*  BUN 11 9 9   CREATININE 0.81 0.76 0.75  CALCIUM 8.6* 8.8* 8.7*  GFRNONAA >60 >60 >60  GFRAA >60 >60 >60  ANIONGAP 11 6 5      Cardiac Enzymes Recent Labs Lab 09/26/16 1012 09/26/16 1245 09/26/16 1852 09/27/16 0019  TROPONINI 0.03* 0.03* 0.09* <0.03     BNP Recent Labs Lab 09/26/16 1105  BNP 624.0*     Radiology    Dg Chest 2 View Result Date: 09/29/2016 CLINICAL DATA:  Pleural effusion. EXAM: CHEST  2 VIEW COMPARISON:  Radiograph September 26, 2016. FINDINGS: Stable cardiomegaly. Atherosclerosis thoracic  aorta is noted. No pneumothorax is noted. Continued presence of bibasilar edema or atelectasis with associated pleural effusions. Calcified pleural plaques are again noted bilaterally. Bony thorax is unremarkable. IMPRESSION: Aortic atherosclerosis. Stable bibasilar opacities as described above. Electronically Signed   By: Marijo Conception, M.D.   On: 09/29/2016 08:15   Dg Chest Port 1 View Result Date: 09/26/2016 CLINICAL DATA:  Dyspnea. EXAM: PORTABLE CHEST 1 VIEW COMPARISON:  Radiographs of September 20, 2016. FINDINGS: Stable cardiomegaly. Atherosclerosis of thoracic aorta is noted. No pneumothorax is noted. Moderate bibasilar opacities are noted concerning for edema or atelectasis with associated pleural effusions. 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 Bony thorax is unremarkable. IMPRESSION: Aortic atherosclerosis. Stable cardiomegaly. Moderate bibasilar opacities are noted concerning for edema or atelectasis with associated pleural effusions. Electronically Signed   By: Marijo Conception, M.D.   On: 09/26/2016 11:47     Cardiac Studies   Echocardiogram, 09/24/26: Study Conclusions - Left ventricle: The cavity size was severely dilated. Wall thickness was normal. Systolic function was moderately reduced. The estimated ejection fraction was in the range of 35% to 40%. Diffuse hypokinesis. The study is not technically sufficient to allow evaluation of LV diastolic function. - Aortic valve: Sclerosis without stenosis. There was moderate regurgitation. - Mitral valve: Mildly thickened leaflets . There was severe regurgitation directed posteriorly. - Left atrium: Massively dilated (115 ml/m2). - Right ventricle: The cavity size was mildly dilated. Reduced RV systolic function. Lateral annulus peak S velocity: 7.62 cm/s. - Right atrium: Massively dilated (90 ml/m2). - Tricuspid valve: There was moderate regurgitation. - Pulmonary  arteries: PA peak pressure: 52 mm Hg (S). - Inferior vena cava: The vessel was dilated. The respirophasic diameter changes were blunted (<50%), consistent with elevated central venous pressure. - Pericardium, extracardiac: A trivial pericardial effusion was identified posterior to the heart. There was a left pleuraleffusion. Recommendations: LVEF 35-40%, severely dilated LV with global hypokinesis and normal wall thickness, moderate AI, severe posteriorly directed MR, massive biatrial enlargement, mild RVE with reduced RV systolic function, moderate TR, RVSP 52 mmHg, dilated IVC, trivial pericardial effusion with left pleural effusion. Findings consistent with dilated cardiomyopathy.   CATH: n/a   Patient Profile     83 y.o. male w/ hx myasthenia gravis, chronic afib, chronic anticoagulation with Coumadin, HTN, pulmonary fibrosis, S-D-CHF, cath greater than 5 years ago without obstructive disease, DM2, hx PVD with partial left lower extremity amputation. Admitted 02/23 w/ poly-organism bacteremia. Source of bacteremia has not been identified. TEE planned 03/05  Assessment & Plan    1. Acute on chronic combined systolic and diastolic heart failure. Improvement in respiratory status with diuresis, but needs mor. Lasix 40 mg IV x 3 doses, none on 03/03>03/04, Lasix 20 mg IV x 1 and now on 40 mg IV. TEE Monday. I/O neg 2.7 L since admission. Continue diuresis.   2. Poly-organism bacteremia: Gram-negative ride and Candida. TEE when resp stable to exclude the possibility of endocarditis. Reschedule for 03/06  3. Chronic atrial fibrillation, Rate control improved and will likely continue to impove as his overall status improves.  - on metoprolol 25 mg bid  4. Severe mitral regurgitation,  - on hydralazine 4 mg qid for afterload reduction, will increase to 10 mg - If he tolerates hydralazine at 10 mg qid, consider adding long-acting nitrates.  Overall very tenuous situation  in this 83 year old gentleman with poly-organism bacteremia, left lower extremity partial amputation, and acute CHF related to volume loading and diuretic with hold on admission. Depending upon course, may need to consider further discussions with family concerning CODE STATUS.  Otherwise, per IM Active Problems:   Chronic atrial fibrillation (HCC)   Myasthenia gravis (HCC)   Amputated toe, left (HCC)   Anticoagulated on Coumadin   Sepsis due to Gram negative bacteria (HCC)   Gram-negative bacteremia   Leukocytosis   Hypokalemia   Benign essential HTN   Goals of care, counseling/discussion   Palliative care by specialist   Acute respiratory distress  Augusto Garbe 10:43 AM 09/30/2016 Pager: [phone]  Patient seen, examined. Available data reviewed. Agree with findings, assessment, and plan as outlined by Rosaria Ferries, PA-C. Elderly male in NAD. His caregivers at the bedside. JVP is mildly elevated. Lung fields are clear. Heart is irregularly irregular rate and rhythm with a loud 4/6 holosystolic murmur at the apex. Abdomen is soft and nontender. There is no pretibial edema.  He had a lengthy discussion with the patient and his caregiver. I also discussed his case with Dr. Linus Salmons as well as the patient's daughter who is his power of attorney. Considering his advanced age and multiple comorbid conditions, it seems reasonable to treat him presumptively with a longer course of antibiotics rather than put him through a transesophageal echocardiogram. His respiratory status has been fairly tenuous and his TEE has now been canceled twice during this admission. We will continue to diurese him as tolerated. Will arrange a follow-up echocardiogram in one month. It appears his mitral regurgitation is long-standing based on review of outpatient records.  Sherren Mocha, M.D. 09/30/2016 3:12 PM

## 2016-09-30 NOTE — Progress Notes (Signed)
Nurse notified by telemetry dept that patient had 9 beats of V-Tach.  Patient sitting upright in bed having lunch.  Denies any discomfort.  Patient's daughter at bedside.  No distress noted.  MD notified via pager.  Will continue to monitor patient. Marcille Blanco, RN

## 2016-10-01 LAB — PROTIME-INR
INR: 3.09
Prothrombin Time: 32.5 seconds — ABNORMAL HIGH (ref 11.4–15.2)

## 2016-10-01 MED ORDER — FUROSEMIDE 40 MG PO TABS
40.0000 mg | ORAL_TABLET | Freq: Two times a day (BID) | ORAL | Status: DC
  Administered 2016-10-01 – 2016-10-02 (×2): 40 mg via ORAL
  Filled 2016-10-01 (×2): qty 1

## 2016-10-01 MED ORDER — WARFARIN 0.5 MG HALF TABLET
0.5000 mg | ORAL_TABLET | Freq: Once | ORAL | Status: AC
  Administered 2016-10-01: 0.5 mg via ORAL
  Filled 2016-10-01: qty 1

## 2016-10-01 NOTE — Progress Notes (Signed)
Dressing on left foot changed, patient tolerated well. No complaints last night, but very forgetful.

## 2016-10-01 NOTE — Progress Notes (Addendum)
ANTICOAGULATION CONSULT NOTE - Follow Up Consult  Pharmacy Consult for Warfarin Indication: atrial fibrillation  Assessment: Richard Bradley admitted 2/23 on warfarin at home for Afib (CHADSVASC = 4). Home dose of warfarin is 5 mg MWF, 2.5 mg all other days (weekly dose of 25 mg). INR was therapeutic at 2.28 on admission.   Today's INR is 3.09, therapeutic just slightly above 3.0. We are dosing warfarin cautiously due to drug-drug interaction with fluconazole and poor po intake. Eating better today. CBC on 3/5 was stable with Hgb improved to 12.6 and no overt bleeding noted.  Continues on day #9 of fluconazole 400 mg IV q24h which does appear to be increasing effect of warfarin. We are giving lower doses of warfarin as compared to his PTA dose and warfarin held x 2 days 3/1-3/2.  Yesterday and today INR bumped up slightly above 3 after 1mg  daily x 2 days and dose held yesterday.  May only need every other day dosing of warfarin while on fluconazole.  09/24/16: Bilateral LE dopplers negative for DVT  Goal of Therapy:  INR 2-3 Monitor platelets by anticoagulation protocol: Yes   Plan:  Warfarin 0.5 mg tonight x1 Monitor daily INR, CBC, po intake, s/sx bleed.  Will need INR monitored closely during and after fluconazole treatment (FCZ thru 4/9).  Will likely need warfarin dose increase after fluconazole discontinued.     Allergies  Allergen Reactions  . Tape Other (See Comments)    PATIENT'S SKIN IS VERY THIN AND WILL TEAR AND BRUISE VERY EASILY!!    Patient Measurements: Height: 6\' 3"  (190.5 cm) Weight: 158 lb 1.6 oz (71.7 kg) (Scale B) IBW/kg (Calculated) : 84.5  Vital Signs: Temp: 97.8 F (36.6 C) (03/06 0607) Temp Source: Oral (03/06 0607) BP: 115/67 (03/06 0800) Pulse Rate: 114 (03/06 0800)  Labs:  Recent Labs  09/29/16 0358 09/30/16 0458 10/01/16 0450  HGB  --  12.6*  --   HCT  --  40.2  --   PLT  --  259  --   LABPROT 30.3* 32.2* 32.5*  INR 2.83 3.04 3.09  CREATININE  0.76 0.75  --     Estimated Creatinine Clearance: 64.7 mL/min (by C-G formula based on SCr of 0.75 mg/dL).  Medications:  Scheduled:  . digoxin  0.0625 mg Oral Daily  . escitalopram  5 mg Oral QHS  . feeding supplement (ENSURE ENLIVE)  237 mL Oral BID BM  . fluconazole (DIFLUCAN) IV  400 mg Intravenous Q24H  . fluticasone  1 spray Each Nare BID  . furosemide  40 mg Oral BID  . hydrALAZINE  10 mg Oral Q6H  . mouth rinse  15 mL Mouth Rinse BID  . metoprolol tartrate  25 mg Oral BID  . pantoprazole  40 mg Oral Daily  . polycarbophil  625 mg Oral TID  . polyethylene glycol  17 g Oral QHS  . predniSONE  40 mg Oral Q breakfast  . Warfarin - Pharmacist Dosing Inpatient   Does not apply q1800    Nicole Cella, RPh Clinical Pharmacist Pager: [phone] 8A-4P [phone] 4P-10P [phone] [place] [phone] 10/01/2016 11:03 AM

## 2016-10-01 NOTE — Progress Notes (Signed)
Physical Therapy Treatment Patient Details Name: Richard Bradley MRN: [id number] DOB: 05-26-1926 Today's Date: 10/01/2016    History of Present Illness 83 y.o. male  with past medical history of chronic A. Fib on coumadin, dCHF, Myasthenia Gravis, Prostate Cancer, Basal Cell cancer, recent left TMT amputation, urolithiasis, and Osteoarthritis who presented to the ED with nausea, dry heaves, and chills for one day admitted for sepsis.    PT Comments    Pt able to progress to ambulation during session, going 6 feet with rw. Cues provided to maintain NWB through LLE. Daughter present for session and confirming D/C plan to home with family support. States that they have a w/c to assist with mobility into the home. PT to continue to follow to progress mobility and safety.   Follow Up Recommendations  Home health PT;Supervision for mobility/OOB     Equipment Recommendations  None recommended by PT    Recommendations for Other Services       Precautions / Restrictions Precautions Precautions: Fall Restrictions Weight Bearing Restrictions: Yes LLE Weight Bearing: Non weight bearing    Mobility  Bed Mobility Overal bed mobility: Needs Assistance Bed Mobility: Supine to Sit     Supine to sit: Supervision     General bed mobility comments: HOB elevated approx. 30 degrees, using rail to assist  Transfers Overall transfer level: Needs assistance Equipment used: Rolling walker (2 wheeled) Transfers: Sit to/from Stand Sit to Stand: Min guard         General transfer comment: reminder for NWB through LLE.   Ambulation/Gait Ambulation/Gait assistance: Min guard Ambulation Distance (Feet): 6 Feet Assistive device: Rolling walker (2 wheeled) Gait Pattern/deviations: Step-to pattern Gait velocity: decreased   General Gait Details: cues to maintain/reduce weight through LLE.    Stairs            Wheelchair Mobility    Modified Rankin (Stroke Patients Only)        Balance Overall balance assessment: Needs assistance Sitting-balance support: No upper extremity supported Sitting balance-Leahy Scale: Good     Standing balance support: Bilateral upper extremity supported Standing balance-Leahy Scale: Poor Standing balance comment: using rw for support                    Cognition Arousal/Alertness: Awake/alert Behavior During Therapy: WFL for tasks assessed/performed Overall Cognitive Status: Within Functional Limits for tasks assessed                      Exercises      General Comments General comments (skin integrity, edema, etc.): Pt on 1.5L O2/ [place] - SpO2 91% while sitting.       Pertinent Vitals/Pain Pain Assessment: No/denies pain    Home Living                      Prior Function            PT Goals (current goals can now be found in the care plan section) Acute Rehab PT Goals Patient Stated Goal: go home PT Goal Formulation: With family Time For Goal Achievement: 10/06/16 Potential to Achieve Goals: Good Progress towards PT goals: Progressing toward goals    Frequency    Min 3X/week      PT Plan Current plan remains appropriate    Co-evaluation             End of Session Equipment Utilized During Treatment: Gait belt;Oxygen Activity Tolerance: Patient tolerated treatment well  Patient left: in chair;with call bell/phone within reach;with chair alarm set;with family/visitor present Nurse Communication: Mobility status PT Visit Diagnosis: Muscle weakness (generalized) (M62.81);Difficulty in walking, not elsewhere classified (R26.2)     Time: [id number] PT Time Calculation (min) (ACUTE ONLY): 29 min  Charges:  $Gait Training: 8-22 mins $Therapeutic Activity: 8-22 mins                    G Codes:       Cassell Clement, PT, CSCS Pager [phone] Office [phone]  10/01/2016, 2:17 PM

## 2016-10-01 NOTE — Progress Notes (Signed)
Personal sitter at bedside and helped patient to chair for breakfast.

## 2016-10-01 NOTE — Care Management Important Message (Signed)
Important Message  Patient Details  Name: Richard Bradley MRN: [id number] Date of Birth: 02-Nov-1925   Medicare Important Message Given:  Yes    Missey Hasley 10/01/2016, 11:20 AM

## 2016-10-01 NOTE — Progress Notes (Signed)
Progress Note  Patient Name: Richard Bradley Date of Encounter: 10/01/2016  Primary Cardiologist: Dr Tamala Julian  Patient Profile     83 y.o. male w/ hx hx myasthenia gravis, chronic afib, chronic anticoagulation with Coumadin, HTN, pulmonary fibrosis, S-D-CHF, cath greater than 5 years ago without obstructive disease, DM2, hx PVD with partial left lower extremity amputation. Admitted 02/23 w/ poly-organism bacteremia. Source of bacteremia has not been identified.   Subjective   Pt feels much better today, he is up to the chair and has eaten well. Much more verbal, much more energy - he was able to do 1000 cc on incentive spirometry  Inpatient Medications    Scheduled Meds: . digoxin  0.0625 mg Oral Daily  . escitalopram  5 mg Oral QHS  . feeding supplement (ENSURE ENLIVE)  237 mL Oral BID BM  . fluconazole (DIFLUCAN) IV  400 mg Intravenous Q24H  . fluticasone  1 spray Each Nare BID  . furosemide  40 mg Intravenous BID  . hydrALAZINE  10 mg Oral Q6H  . mouth rinse  15 mL Mouth Rinse BID  . metoprolol tartrate  25 mg Oral BID  . pantoprazole  40 mg Oral Daily  . polycarbophil  625 mg Oral TID  . polyethylene glycol  17 g Oral QHS  . predniSONE  40 mg Oral Q breakfast  . Warfarin - Pharmacist Dosing Inpatient   Does not apply q1800   Continuous Infusions: . sodium chloride 20 mL/hr at 09/30/16 2117   PRN Meds: acetaminophen, guaiFENesin, HYDROcodone-acetaminophen, ipratropium, ondansetron **OR** ondansetron (ZOFRAN) IV   Vital Signs    Vitals:   09/30/16 1947 09/30/16 2344 10/01/16 0607 10/01/16 0800  BP: 100/60 114/74 115/77 115/67  Pulse: 75  62 (!) 114  Resp: 18  18   Temp: 97.9 F (36.6 C)  97.8 F (36.6 C)   TempSrc: Oral  Oral   SpO2: 97%  95% 92%  Weight:   158 lb 1.6 oz (71.7 kg)   Height:        Intake/Output Summary (Last 24 hours) at 10/01/16 0911 Last data filed at 10/01/16 [id number]  Gross per 24 hour  Intake             2336 ml  Output             3050 ml   Net             -714 ml   Filed Weights   09/29/16 0527 09/30/16 0537 10/01/16 0607  Weight: 165 lb 12.8 oz (75.2 kg) 172 lb 12.8 oz (78.4 kg) 158 lb 1.6 oz (71.7 kg)    Telemetry    Atrial fib, RVR frequently - Personally Reviewed  ECG    N/a - Personally Reviewed  Physical Exam   General: Well developed, well nourished, male appearing in moderate distress. Head: Normocephalic, atraumatic.  Neck: Supple without bruits, JVD 8-9 cm. Lungs:  Resp regular and unlabored, bibasilar rales. Heart: Irreg R&R, S1, S2, no S3, S4, soft murmur; no rub. Abdomen: Soft, non-tender, non-distended with normoactive bowel sounds. No hepatomegaly. No rebound/guarding. No obvious abdominal masses. Extremities: No clubbing, cyanosis, no edema. Distal pedal pulses are 2+ bilaterally. Fluid collection R calf w/ IM managing, s/p L partial foot amputation Neuro: Alert and oriented X 3. Moves all extremities spontaneously. Psych: Normal affect.  Labs    Hematology  Recent Labs Lab 09/26/16 0357 09/27/16 0019 09/30/16 0458  WBC 10.2 10.3 11.6*  RBC 3.42* 3.34* 3.94*  HGB 11.0* 10.7* 12.6*  HCT 35.0* 34.3* 40.2  MCV 102.3* 102.7* 102.0*  MCH 32.2 32.0 32.0  MCHC 31.4 31.2 31.3  RDW 14.4 14.4 14.4  PLT 217 206 259    Chemistry  Recent Labs Lab 09/28/16 0502 09/29/16 0358 09/30/16 0458  NA 141 140 140  K 4.0 4.0 4.0  CL 93* 96* 99*  CO2 37* 38* 36*  GLUCOSE 94 100* 105*  BUN 11 9 9   CREATININE 0.81 0.76 0.75  CALCIUM 8.6* 8.8* 8.7*  GFRNONAA >60 >60 >60  GFRAA >60 >60 >60  ANIONGAP 11 6 5      Cardiac Enzymes  Recent Labs Lab 09/26/16 1012 09/26/16 1245 09/26/16 1852 09/27/16 0019  TROPONINI 0.03* 0.03* 0.09* <0.03     BNP  Recent Labs Lab 09/26/16 1105  BNP 624.0*     Radiology    Dg Chest 2 View Result Date: 09/29/2016 CLINICAL DATA:  Pleural effusion. EXAM: CHEST  2 VIEW COMPARISON:  Radiograph September 26, 2016. FINDINGS: Stable cardiomegaly. Atherosclerosis  thoracic aorta is noted. No pneumothorax is noted. Continued presence of bibasilar edema or atelectasis with associated pleural effusions. Calcified pleural plaques are again noted bilaterally. Bony thorax is unremarkable. IMPRESSION: Aortic atherosclerosis. Stable bibasilar opacities as described above. Electronically Signed   By: Marijo Conception, M.D.   On: 09/29/2016 08:15   Dg Chest Port 1 View Result Date: 09/26/2016 CLINICAL DATA:  Dyspnea. EXAM: PORTABLE CHEST 1 VIEW COMPARISON:  Radiographs of September 20, 2016. FINDINGS: Stable cardiomegaly. Atherosclerosis of thoracic aorta is noted. No pneumothorax is noted. Moderate bibasilar opacities are noted concerning for edema or atelectasis with associated pleural effusions. 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 Bony thorax is unremarkable. IMPRESSION: Aortic atherosclerosis. Stable cardiomegaly. Moderate bibasilar opacities are noted concerning for edema or atelectasis with associated pleural effusions. Electronically Signed   By: Marijo Conception, M.D.   On: 09/26/2016 11:47     Cardiac Studies   Echocardiogram, 09/24/26: Study Conclusions - Left ventricle: The cavity size was severely dilated. Wall thickness was normal. Systolic function was moderately reduced. The estimated ejection fraction was in the range of 35% to 40%. Diffuse hypokinesis. The study is not technically sufficient to allow evaluation of LV diastolic function. - Aortic valve: Sclerosis without stenosis. There was moderate regurgitation. - Mitral valve: Mildly thickened leaflets . There was severe regurgitation directed posteriorly. - Left atrium: Massively dilated (115 ml/m2). - Right ventricle: The cavity size was mildly dilated. Reduced RV systolic function. Lateral annulus peak S velocity: 7.62 cm/s. - Right atrium: Massively dilated (90 ml/m2). - Tricuspid valve: There was moderate regurgitation. - Pulmonary  arteries: PA peak pressure: 52 mm Hg (S). - Inferior vena cava: The vessel was dilated. The respirophasic diameter changes were blunted (<50%), consistent with elevated central venous pressure. - Pericardium, extracardiac: A trivial pericardial effusion was identified posterior to the heart. There was a left pleuraleffusion. Recommendations: LVEF 35-40%, severely dilated LV with global hypokinesis and normal wall thickness, moderate AI, severe posteriorly directed MR, massive biatrial enlargement, mild RVE with reduced RV systolic function, moderate TR, RVSP 52 mmHg, dilated IVC, trivial pericardial effusion with left pleural effusion. Findings consistent with dilated cardiomyopathy.   CATH: n/a   Patient Profile     83 y.o. male w/ hx myasthenia gravis, chronic afib, chronic anticoagulation with Coumadin, HTN, pulmonary fibrosis, S-D-CHF, cath greater than 5 years ago without obstructive disease, DM2, hx PVD with partial left lower extremity amputation. Admitted 02/23  w/ poly-organism bacteremia. Source of bacteremia has not been identified.   Assessment & Plan    1. Acute on chronic combined systolic and diastolic heart failure. Improvement in respiratory status with diuresis.  - Neck veins are improved but lungs still w/ rales.  - Was on Lasix 40 mg IV x 3 doses, none on 03/03>03/04, Lasix 20 mg IV x 1 and then 40 mg IV bid.   - I/O neg 3.4 L since admission. Wt down 11 lbs.  BUN/Cr are stable - think ok to change to po Lasix today, continue to follow weight. This is the lowest weight seen going back over a year. - try to wean his O2, he was not on continuous O2 at home, just prn.  2. Poly-organism bacteremia: Gram-negative rods and Candida. TEE canceled due to pt general medical condition, increase the duration of ABX - Repeat echo in 1 month  3. Chronic atrial fibrillation, Rate control improved and will likely continue to impove as his overall status improves.  -  on metoprolol 25 mg bid - HR still goes up with minimal activity, BP not high enough to increase BB, now, could increase if we cut back the hydralazine.  - elevated HR may be partly due to higher dose steroids.  4. Severe mitral regurgitation,  - on hydralazine 5 mg qid for afterload reduction, increased to 10 mg on 03/05, is tolerating - If he tolerates hydralazine at 10 mg qid, consider adding long-acting nitrates.   Otherwise, per IM Active Problems:   Chronic atrial fibrillation (HCC)   Myasthenia gravis (HCC)   Amputated toe, left (HCC)   Anticoagulated on Coumadin   Sepsis due to Gram negative bacteria (HCC)   Gram-negative bacteremia   Leukocytosis   Hypokalemia   Benign essential HTN   Goals of care, counseling/discussion   Palliative care by specialist   Acute respiratory distress  Signed, Lenoard Aden 9:11 AM 10/01/2016 Pager: [phone]   I have personally seen and examined this patient with Rosaria Ferries, PA-C. I agree with the assessment and plan as outlined above. My exam shows an elderly male in NAD. Mild JVD. Lungs:clear bilaterally. [id number] irreg with loud systolic murmur at the apex. Abs: soft, NT, ND. Ext: no edema. He has bacteremia, unclear source. No plans for TEE at this time (see Dr. York Cerise note yesterday) given his advanced age and multiple comorbid conditions. Continue antibiotics and repeat echo in one month. Continue afterload reduction for mitral regurgitation which appears to be chronic. Should be ready for d/c home tomorrow.   Lauree Chandler 10/01/2016 12:54 PM

## 2016-10-01 NOTE — Care Management Note (Signed)
Case Management Note  Patient Details  Name: Richard Bradley MRN: [id number] Date of Birth: 02-Apr-1926  Subjective/Objective:    Admitted with Chronic Atrial Fib               Action/Plan: Patient lives at home with his daughter; will have 24 hr caregivers at his home at discharge; PCP is [name], MD; has private insurance with [facility] with prescription drug coverage; pharmacy of choice is [facility]; daughter reports no problem with medication; DME - home oxygen, wheelchair, 3:1, walker; [place] choice offered, family chose [facility]; Butch Penny with [facility] called for arrangements; Attending MD at discharge please enter the face to face in Epic for [facility] services.  Expected Discharge Date:    possibly 10/02/2016              Expected Discharge Plan:  [place]  Discharge planning Services  CM Consult Choice offered to:  Adult Children, Patient     HH Arranged:  PT [place] Agency:  [place]  Status of Service:   In progress  Sherrilyn Rist [id number] 10/01/2016, 11:52 AM

## 2016-10-01 NOTE — Progress Notes (Signed)
[facility]                                                                              Patient Demographics  Richard Bradley, is a 83 y.o. male, DOB - 1925-09-23, BFX:[id number]  Admit date - 09/20/2016   Admitting Physician [name], MD  Outpatient Primary MD for the patient is [name], MD  Outpatient specialists:   LOS - 11  days    Chief Complaint  Patient presents with  . Nausea  . Fever  . flu like symptoms       Brief summary   83 y.o.malewith medical history significant for chronic atrial fibrillation on AC with coumadin, myasthenia gravis, prostate cancer, basal cell CA, recently underwent a left TMTamputation and is coming to the emergency department with nausea, dry heaves and chills since the morning of the admission.  On admission. BP was 90/48 and has improved with IV fluids to 159/109. HR was 121, RR 18-33, T max was 101.88F, oxygen saturation was 86% on room air but has improved to 95% with [place] oxygen support. Blood work was notable for WBC count 19.5, hemoglobin 12.1, potassium 2.8 which was supplemented. Lactic acid was 1.98. CXR showed small bilateral effusions but no focal consolidation. UA showed rare bacteria, no leukocytes. He was started on empiric broad spectrum abx until blood and urine cx results are back.  I assumed care on 09/26/16  Assessment & Plan    Principal problem Acute hypoxic respiratory failure/pulmonary edema and due to acute on chronic systolic CHF exacerbation - Likely due to volume overload -  2-D echo 2/27 showed EF of 35-40% with diffuse hypokinesis, massive bilateral enlargement, reduced RV systolic function, moderate TR, findings consistent with dilated cardiomyopathy - Chest x-ray 3/4 showed improving bilateral pleural effusions - Transitioned to oral Lasix today. No plans of TEE at this time given advanced age and multiple comorbid problems. - Wean O2 as tolerated  Active Problems:   Sepsis  due to pseudomonas bacteremia and Candida Albicans now, source unclear at this time, RLE cellulitis  - Sepsis criteria met on admission with fever, tachycardia, tachypnea, leukocytosis, lactic acidosis - Blood cx on admission was positive for Pseudomonas.   - Initially started on vancomycin and Zosyn, ID was consulted, recommended meropenem, now completed for Pseudomonas bacteremia. - Repeat blood cultures 2/25 showed Candida albicans. Started on Diflucan IV. - 2-D echo showed EF 35-40%, no vegetations. -  Repeat blood cultures 2/27 has been negative - No plans for TEE at this time due to advanced age and multiple comorbid conditions. ID recommended treat candidemia for 6 weeks with oral fluconazole 400 mg daily through April 9. Patient will need outpatient eye exam to rule out end ophthalmitis next week. Outpatient 2-D echo by cardiology.     RLE cellulitis/Left transmetatarsal amputation on 09/11/16 - current ABX should be adequate in coverage  - LE dopplers negative for DVT - seen by Dr Sharol Given- he can follow up in 2 weeks in his clinic.     Chronic atrial fibrillation (HCC) with RVR - CHADS vasc score 4 - On Anticoagulation with coumadin - Continue rate  control with metoprolol - Cardiology consulted    Myasthenia gravis ([place]) - On chronic steroids, patient is supposed to be on prednisone 20 mg daily however was receiving a lower dose - on prednisone 40 mg for next 3 days, then resumed regular outpatient dose of 20 mg daily     Amputated toe, left (HCC) - Stable     Benign essential HTN - Continue metoprolol    Code Status: Partial code, no intubation DVT Prophylaxis:  Coumadin Family Communication: Discussed in detail with the patient, all imaging results, lab results explained to the patient and daughter at the bedside  Disposition Plan:   Time Spent in minutes  25 minutes  Procedures:  echo  Consultants:   Cardiology  ID   Anti microbials:   Vanco and zosyn  09/20/2016   Now on Meropenem and DiFlucan   Medications  Scheduled Meds: . digoxin  0.0625 mg Oral Daily  . escitalopram  5 mg Oral QHS  . feeding supplement (ENSURE ENLIVE)  237 mL Oral BID BM  . fluconazole (DIFLUCAN) IV  400 mg Intravenous Q24H  . fluticasone  1 spray Each Nare BID  . furosemide  40 mg Oral BID  . hydrALAZINE  10 mg Oral Q6H  . mouth rinse  15 mL Mouth Rinse BID  . metoprolol tartrate  25 mg Oral BID  . pantoprazole  40 mg Oral Daily  . polycarbophil  625 mg Oral TID  . polyethylene glycol  17 g Oral QHS  . predniSONE  40 mg Oral Q breakfast  . warfarin  0.5 mg Oral ONCE-1800  . Warfarin - Pharmacist Dosing Inpatient   Does not apply q1800   Continuous Infusions: . sodium chloride 20 mL/hr at 09/30/16 2117   PRN Meds:.acetaminophen, guaiFENesin, HYDROcodone-acetaminophen, ipratropium, ondansetron **OR** ondansetron (ZOFRAN) IV   Antibiotics   Anti-infectives    Start     Dose/Rate Route Frequency Ordered Stop   09/25/16 0900  fluconazole (DIFLUCAN) IVPB 400 mg     400 mg 100 mL/hr over 120 Minutes Intravenous Every 24 hours 09/24/16 0832     09/24/16 0900  anidulafungin (ERAXIS) 200 mg in sodium chloride 0.9 % 200 mL IVPB  Status:  Discontinued     200 mg over 180 Minutes Intravenous Every 24 hours 09/24/16 0817 09/24/16 0832   09/24/16 0900  meropenem (MERREM) 2 g in sodium chloride 0.9 % 100 mL IVPB  Status:  Discontinued     2 g 200 mL/hr over 30 Minutes Intravenous Every 8 hours 09/24/16 0827 09/30/16 1536   09/24/16 0900  fluconazole (DIFLUCAN) IVPB 800 mg     800 mg 200 mL/hr over 120 Minutes Intravenous  Once 09/24/16 0832 09/24/16 1504   09/23/16 1800  ciprofloxacin (CIPRO) tablet 750 mg  Status:  Discontinued     750 mg Oral 2 times daily 09/23/16 1330 09/24/16 0816   09/22/16 2300  vancomycin (VANCOCIN) 1,250 mg in sodium chloride 0.9 % 250 mL IVPB  Status:  Discontinued     1,250 mg 166.7 mL/hr over 90 Minutes Intravenous Every 12 hours  09/22/16 1227 09/23/16 0851   09/21/16 0800  piperacillin-tazobactam (ZOSYN) IVPB 3.375 g  Status:  Discontinued     3.375 g 12.5 mL/hr over 240 Minutes Intravenous Every 8 hours 09/21/16 0733 09/23/16 1330   09/21/16 0730  piperacillin-tazobactam (ZOSYN) IVPB 3.375 g  Status:  Discontinued     3.375 g 100 mL/hr over 30 Minutes Intravenous Every 8 hours 09/21/16 0724 09/21/16  0730   09/20/16 2300  vancomycin (VANCOCIN) IVPB 1000 mg/200 mL premix  Status:  Discontinued     1,000 mg 200 mL/hr over 60 Minutes Intravenous Every 12 hours 09/20/16 2247 09/22/16 1227   09/20/16 2145  cefTRIAXone (ROCEPHIN) 1 g in dextrose 5 % 50 mL IVPB  Status:  Discontinued     1 g 100 mL/hr over 30 Minutes Intravenous  Once 09/20/16 2130 09/20/16 2132   09/20/16 2145  piperacillin-tazobactam (ZOSYN) IVPB 3.375 g     3.375 g 100 mL/hr over 30 Minutes Intravenous  Once 09/20/16 2132 09/20/16 2318        Subjective:   Keonte Daubenspeck was seen and examined today. Improving, still somewhat short of breath, weak.  Denies any nausea or vomiting, no fevers or chills. Patient denies dizziness, abdominal pain, N/V/D/C, new weakness, numbess, tingling.   Objective:   Vitals:   10/01/16 0607 10/01/16 0800 10/01/16 1239 10/01/16 1241  BP: 115/77 115/67 117/81   Pulse: 62 (!) 114 98   Resp: 18  20   Temp: 97.8 F (36.6 C)     TempSrc: Oral     SpO2: 95% 92%  96%  Weight: 71.7 kg (158 lb 1.6 oz)     Height:        Intake/Output Summary (Last 24 hours) at 10/01/16 1311 Last data filed at 10/01/16 0900  Gross per 24 hour  Intake             2056 ml  Output             2650 ml  Net             -594 ml     Wt Readings from Last 3 Encounters:  10/01/16 71.7 kg (158 lb 1.6 oz)  09/11/16 81.6 kg (180 lb)  08/12/16 81.2 kg (179 lb)     Exam  General: Alert and oriented x 3, NAD  HEENT:    Neck: Supple, + JVD  Cardiovascular: S1 S2 auscultated, Irregularly irregular, tachycardia   Respiratory:  Bibasilar crackles  Gastrointestinal: Soft, nontender, nondistended, + bowel sounds  Ext: no cyanosis clubbing. RLE 1+ edema. TMT amputation left  Neuro: No new deficits  Skin: No rashes  Psych: Normal affect and demeanor, alert and oriented x3    Data Reviewed:  I have personally reviewed following labs and imaging studies  Micro Results Recent Results (from the past 240 hour(s))  Urine culture     Status: Abnormal   Collection Time: 09/21/16  3:08 PM  Result Value Ref Range Status   Specimen Description URINE, RANDOM  Final   Special Requests NONE  Final   Culture <10,000 COLONIES/mL INSIGNIFICANT GROWTH (A)  Final   Report Status 09/22/2016 FINAL  Final  Culture, blood (routine x 2)     Status: None   Collection Time: 09/22/16  4:28 AM  Result Value Ref Range Status   Specimen Description BLOOD RIGHT HAND  Final   Special Requests BOTTLES DRAWN AEROBIC ONLY 5CC  Final   Culture NO GROWTH 5 DAYS  Final   Report Status 09/27/2016 FINAL  Final  Culture, blood (routine x 2)     Status: Abnormal   Collection Time: 09/22/16  4:34 AM  Result Value Ref Range Status   Specimen Description BLOOD LEFT HAND  Final   Special Requests BOTTLES DRAWN AEROBIC ONLY 5CC  Final   Culture  Setup Time   Final    YEAST AEROBIC BOTTLE ONLY CRITICAL  RESULT CALLED TO, READ BACK BY AND VERIFIED WITH: PHARMD Karlene Einstein [phone] MLM    Culture CANDIDA ALBICANS (A)  Final   Report Status 09/25/2016 FINAL  Final  Blood Culture ID Panel (Reflexed)     Status: Abnormal   Collection Time: 09/22/16  4:34 AM  Result Value Ref Range Status   Enterococcus species NOT DETECTED NOT DETECTED Final   Listeria monocytogenes NOT DETECTED NOT DETECTED Final   Staphylococcus species NOT DETECTED NOT DETECTED Final   Staphylococcus aureus NOT DETECTED NOT DETECTED Final   Streptococcus species NOT DETECTED NOT DETECTED Final   Streptococcus agalactiae NOT DETECTED NOT DETECTED Final   Streptococcus  pneumoniae NOT DETECTED NOT DETECTED Final   Streptococcus pyogenes NOT DETECTED NOT DETECTED Final   Acinetobacter baumannii NOT DETECTED NOT DETECTED Final   Enterobacteriaceae species NOT DETECTED NOT DETECTED Final   Enterobacter cloacae complex NOT DETECTED NOT DETECTED Final   Escherichia coli NOT DETECTED NOT DETECTED Final   Klebsiella oxytoca NOT DETECTED NOT DETECTED Final   Klebsiella pneumoniae NOT DETECTED NOT DETECTED Final   Proteus species NOT DETECTED NOT DETECTED Final   Serratia marcescens NOT DETECTED NOT DETECTED Final   Haemophilus influenzae NOT DETECTED NOT DETECTED Final   Neisseria meningitidis NOT DETECTED NOT DETECTED Final   Pseudomonas aeruginosa NOT DETECTED NOT DETECTED Final   Candida albicans DETECTED (A) NOT DETECTED Final    Comment: CRITICAL RESULT CALLED TO, READ BACK BY AND VERIFIED WITH: PHARMD N BATCHELDER [phone]    Candida glabrata NOT DETECTED NOT DETECTED Final   Candida krusei NOT DETECTED NOT DETECTED Final   Candida parapsilosis NOT DETECTED NOT DETECTED Final   Candida tropicalis NOT DETECTED NOT DETECTED Final  Culture, blood (routine x 2)     Status: None   Collection Time: 09/24/16  4:44 PM  Result Value Ref Range Status   Specimen Description BLOOD RIGHT HAND  Final   Special Requests IN PEDIATRIC BOTTLE 1ML  Final   Culture NO GROWTH 5 DAYS  Final   Report Status 09/29/2016 FINAL  Final  Culture, blood (routine x 2)     Status: None   Collection Time: 09/24/16  5:03 PM  Result Value Ref Range Status   Specimen Description BLOOD LEFT HAND  Final   Special Requests IN PEDIATRIC BOTTLE 1CC  Final   Culture NO GROWTH 5 DAYS  Final   Report Status 09/29/2016 FINAL  Final    Radiology Reports Dg Chest 2 View  Result Date: 09/29/2016 CLINICAL DATA:  Pleural effusion. EXAM: CHEST  2 VIEW COMPARISON:  Radiograph September 26, 2016. FINDINGS: Stable cardiomegaly. Atherosclerosis thoracic aorta is noted. No pneumothorax is noted.  Continued presence of bibasilar edema or atelectasis with associated pleural effusions. Calcified pleural plaques are again noted bilaterally. Bony thorax is unremarkable. IMPRESSION: Aortic atherosclerosis. Stable bibasilar opacities as described above. Electronically Signed   By: Marijo Conception, M.D.   On: 09/29/2016 08:15   Dg Chest 2 View  Result Date: 09/20/2016 CLINICAL DATA:  Nausea chills and dry heaves EXAM: CHEST  2 VIEW COMPARISON:  06/14/2016 FINDINGS: Mildly low lung volumes. Extensive calcified pleural plaques. Small bilateral effusions. No focal consolidation. Stable moderate cardiomegaly. No pneumothorax. Atherosclerosis. Previous vertebral augmentation of the thoracolumbar spine. Moderate compression of a mid thoracic vertebra. IMPRESSION: 1. Small bilateral effusions.  No focal consolidation. 2. Stable moderate cardiomegaly without overt edema 3. Extensive calcified pleural plaques Electronically Signed   By: Madie Reno.D.  On: 09/20/2016 21:19   Dg Chest Port 1 View  Result Date: 09/26/2016 CLINICAL DATA:  Dyspnea. EXAM: PORTABLE CHEST 1 VIEW COMPARISON:  Radiographs of September 20, 2016. FINDINGS: Stable cardiomegaly. Atherosclerosis of thoracic aorta is noted. No pneumothorax is noted. Moderate bibasilar opacities are noted concerning for edema or atelectasis with associated pleural effusions. 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 Bony thorax is unremarkable. IMPRESSION: Aortic atherosclerosis. Stable cardiomegaly. Moderate bibasilar opacities are noted concerning for edema or atelectasis with associated pleural effusions. Electronically Signed   By: Marijo Conception, M.D.   On: 09/26/2016 11:47    Lab Data:  CBC:  Recent Labs Lab 09/25/16 0337 09/26/16 0357 09/27/16 0019 09/30/16 0458  WBC 10.3 10.2 10.3 11.6*  HGB 10.6* 11.0* 10.7* 12.6*  HCT 33.7* 35.0* 34.3* 40.2  MCV 101.5* 102.3* 102.7* 102.0*  PLT 207 217 206 259     Basic Metabolic Panel:  Recent Labs Lab 09/25/16 0337 09/26/16 0357 09/27/16 0019 09/28/16 0502 09/29/16 0358 09/30/16 0458  NA 141 140 142 141 140 140  K 3.7 3.3* 4.1 4.0 4.0 4.0  CL 103 101 99* 93* 96* 99*  CO2 32 33* 35* 37* 38* 36*  GLUCOSE 102* 96 140* 94 100* 105*  BUN _0 CREATININE 0.72 0.78 0.83 0.81 0.76 0.75  CALCIUM 8.4* 8.2* 8.4* 8.6* 8.8* 8.7*  MG 2.0 2.0 1.9  --   --   --    GFR: Estimated Creatinine Clearance: 64.7 mL/min (by C-G formula based on SCr of 0.75 mg/dL). Liver Function Tests: No results for input(s): AST, ALT, ALKPHOS, BILITOT, PROT, ALBUMIN in the last 168 hours. No results for input(s): LIPASE, AMYLASE in the last 168 hours. No results for input(s): AMMONIA in the last 168 hours. Coagulation Profile:  Recent Labs Lab 09/27/16 0019 09/28/16 0502 09/29/16 0358 09/30/16 0458 10/01/16 0450  INR 3.60 3.05 2.83 3.04 3.09   Cardiac Enzymes:  Recent Labs Lab 09/26/16 1012 09/26/16 1245 09/26/16 1852 09/27/16 0019  TROPONINI 0.03* 0.03* 0.09* <0.03   BNP (last 3 results) No results for input(s): PROBNP in the last 8760 hours. HbA1C: No results for input(s): HGBA1C in the last 72 hours. CBG:  Recent Labs Lab 09/30/16 0326  GLUCAP 125*   Lipid Profile: No results for input(s): CHOL, HDL, LDLCALC, TRIG, CHOLHDL, LDLDIRECT in the last 72 hours. Thyroid Function Tests: No results for input(s): TSH, T4TOTAL, FREET4, T3FREE, THYROIDAB in the last 72 hours. Anemia Panel: No results for input(s): VITAMINB12, FOLATE, FERRITIN, TIBC, IRON, RETICCTPCT in the last 72 hours. Urine analysis:    Component Value Date/Time   COLORURINE YELLOW 09/20/2016 2024   APPEARANCEUR HAZY (A) 09/20/2016 2024   LABSPEC 1.015 09/20/2016 2024   PHURINE 6.0 09/20/2016 2024   GLUCOSEU NEGATIVE 09/20/2016 2024   HGBUR MODERATE (A) 09/20/2016 2024   BILIRUBINUR NEGATIVE 09/20/2016 [place] NEGATIVE 09/20/2016 2024   PROTEINUR NEGATIVE  09/20/2016 2024   NITRITE NEGATIVE 09/20/2016 2024   LEUKOCYTESUR NEGATIVE 09/20/2016 2024     RAI,RIPUDEEP M.D. [facility] 10/01/2016, 1:11 PM  Pager: [phone] Between 7am to 7pm - call Pager - [phone]  After 7pm go to [email or web address] - password [email or web address]  Call night coverage person covering after 7pm

## 2016-10-02 ENCOUNTER — Encounter (HOSPITAL_COMMUNITY)
Admission: EM | Disposition: A | Discharge status: Home Health | Payer: Self-pay | Source: Home / Self Care | Attending: Internal Medicine

## 2016-10-02 LAB — BASIC METABOLIC PANEL
ANION GAP: 10 (ref 5–15)
BUN: 21 mg/dL — ABNORMAL HIGH (ref 6–20)
CALCIUM: 8.9 mg/dL (ref 8.9–10.3)
CO2: 32 mmol/L (ref 22–32)
Chloride: 96 mmol/L — ABNORMAL LOW (ref 101–111)
Creatinine, Ser: 0.79 mg/dL (ref 0.61–1.24)
GFR calc non Af Amer: >60 mL/min (ref >60)
Glucose, Bld: 105 mg/dL — ABNORMAL HIGH (ref 65–99)
Potassium: 3.4 mmol/L — ABNORMAL LOW (ref 3.5–5.1)
SODIUM: 138 mmol/L (ref 135–145)

## 2016-10-02 LAB — PROTIME-INR
INR: 2.68
PROTHROMBIN TIME: 29 s — AB (ref 11.4–15.2)

## 2016-10-02 SURGERY — CANCELLED PROCEDURE

## 2016-10-02 MED ORDER — PREDNISONE 10 MG PO TABS: 20.0000 mg | ORAL_TABLET | Freq: Every day | ORAL | Status: DC

## 2016-10-02 MED ORDER — HYDRALAZINE HCL 10 MG PO TABS: 10.0000 mg | ORAL_TABLET | Freq: Four times a day (QID) | ORAL | Status: DC

## 2016-10-02 MED ORDER — DIGOXIN 62.5 MCG PO TABS: 0.0625 mg | ORAL_TABLET | Freq: Every day | ORAL | 3 refills | Status: DC

## 2016-10-02 MED ORDER — METOPROLOL TARTRATE 25 MG PO TABS: 25.0000 mg | ORAL_TABLET | Freq: Two times a day (BID) | ORAL | 3 refills | Status: DC

## 2016-10-02 MED ORDER — FLUCONAZOLE 200 MG PO TABS: 400.0000 mg | ORAL_TABLET | Freq: Every day | ORAL | 0 refills | Status: DC

## 2016-10-02 MED ORDER — POLYETHYLENE GLYCOL 3350 17 G PO PACK: 17.0000 g | PACK | Freq: Every day | ORAL | 0 refills | Status: DC | PRN

## 2016-10-02 MED ORDER — HYDRALAZINE HCL 10 MG PO TABS: 10.0000 mg | ORAL_TABLET | Freq: Three times a day (TID) | ORAL | Status: DC

## 2016-10-02 MED ORDER — FLUCONAZOLE 200 MG PO TABS: 400.0000 mg | ORAL_TABLET | Freq: Every day | ORAL | Status: DC

## 2016-10-02 MED ORDER — FUROSEMIDE 40 MG PO TABS: 40.0000 mg | ORAL_TABLET | Freq: Two times a day (BID) | ORAL | 3 refills | Status: DC

## 2016-10-02 MED ORDER — POLYETHYLENE GLYCOL 3350 17 G PO PACK: 17.0000 g | PACK | Freq: Every day | ORAL | 0 refills | Status: AC | PRN

## 2016-10-02 MED ORDER — HYDRALAZINE HCL 10 MG PO TABS: 10.0000 mg | ORAL_TABLET | Freq: Four times a day (QID) | ORAL | 3 refills | Status: DC

## 2016-10-02 MED ORDER — PANTOPRAZOLE SODIUM 40 MG PO TBEC: 40.0000 mg | DELAYED_RELEASE_TABLET | Freq: Every day | ORAL | 3 refills | Status: AC

## 2016-10-02 MED ORDER — HYDRALAZINE HCL 10 MG PO TABS: 10.0000 mg | ORAL_TABLET | Freq: Three times a day (TID) | ORAL | 3 refills | Status: DC

## 2016-10-02 MED ORDER — POTASSIUM CHLORIDE CRYS ER 20 MEQ PO TBCR: 20.0000 meq | EXTENDED_RELEASE_TABLET | Freq: Every day | ORAL | 3 refills | Status: DC

## 2016-10-02 MED ORDER — POTASSIUM CHLORIDE CRYS ER 20 MEQ PO TBCR: 40.0000 meq | EXTENDED_RELEASE_TABLET | Freq: Once | ORAL | Status: DC

## 2016-10-02 NOTE — Progress Notes (Signed)
Patient hit leg against bed rail and tore the skin, cleaned and dressed the skin tear.

## 2016-10-02 NOTE — Discharge Summary (Signed)
Physician Discharge Summary   Patient ID: Richard Bradley MRN: [id number] DOB/AGE: [birth year] 83 y.o.  Admit date: 09/20/2016 Discharge date: 10/02/2016  Primary Care Physician:  [name], MD  Discharge Diagnoses:     Acute hypoxic respiratory failure Acute on chronic systolic CHF exacerbation Sepsis Pseudomonas bacteremia Candidemia Right lower extremity cellulitis . Chronic atrial fibrillation ([place]) . Myasthenia gravis ([place]) . Leukocytosis . Hypokalemia . Benign essential HTN   Consults:  Infectious disease Cardiology  Recommendations for Outpatient Follow-up:  1. Please repeat CBC/BMET at next visit   DIET: Heart healthy diet    Allergies:   Allergies  Allergen Reactions  . Tape Other (See Comments)    PATIENT'S SKIN IS VERY THIN AND WILL TEAR AND BRUISE VERY EASILY!!     DISCHARGE MEDICATIONS: Discharge Medication List as of 10/02/2016 11:54 AM    START taking these medications   Details  digoxin 62.5 MCG TABS Take 0.0625 mg by mouth daily., Starting Wed 10/02/2016, Print    fluconazole (DIFLUCAN) 200 MG tablet Take 2 tablets (400 mg total) by mouth daily. Stop on 11/04/16, Starting Thu 10/03/2016, Until Mon 11/04/2016, Print    pantoprazole (PROTONIX) 40 MG tablet Take 1 tablet (40 mg total) by mouth daily., Starting Wed 10/02/2016, Print      CONTINUE these medications which have CHANGED   Details  furosemide (LASIX) 40 MG tablet Take 1 tablet (40 mg total) by mouth 2 (two) times daily., Starting Wed 10/02/2016, Print    hydrALAZINE (APRESOLINE) 10 MG tablet Take 1 tablet (10 mg total) by mouth 4 (four) times daily., Starting Wed 10/02/2016, Print    metoprolol tartrate (LOPRESSOR) 25 MG tablet Take 1 tablet (25 mg total) by mouth 2 (two) times daily., Starting Wed 10/02/2016, Print    polyethylene glycol (MIRALAX / GLYCOLAX) packet Take 17 g by mouth daily as needed for moderate constipation. For constipation, Starting Wed 10/02/2016, Print    predniSONE  (DELTASONE) 10 MG tablet Take 2 tablets (20 mg total) by mouth daily with breakfast. Take 63m for 1 more day, then continue 221mdaily, Starting Wed 10/02/2016, No Print      CONTINUE these medications which have NOT CHANGED   Details  Cholecalciferol (VITAMIN D-3 PO) Take 2,000 Units by mouth daily. , Historical Med    escitalopram (LEXAPRO) 5 MG tablet Take 5 mg by mouth at bedtime., Starting Sun 09/15/2016, Historical Med    FIBER PO Take 1 tablet by mouth 3 (three) times daily. , Historical Med    fluticasone (FLONASE) 50 MCG/ACT nasal spray Place 1 spray into both nostrils 2 (two) times daily. , Historical Med    guaiFENesin (MUCINEX) 600 MG 12 hr tablet Take 600 mg by mouth 2 (two) times daily as needed for to loosen phlegm., Historical Med    HYDROcodone-acetaminophen (NORCO) 10-325 MG per tablet Take 1 tablet by mouth every 6 (six) hours as needed (for pain). , Historical Med    ipratropium (ATROVENT) 0.03 % nasal spray Place 2 sprays into both nostrils 3 (three) times daily as needed (for congestion)., Historical Med    vitamin B-12 (CYANOCOBALAMIN) 1000 MCG tablet Take 1,000 mcg by mouth daily., Historical Med    warfarin (COUMADIN) 5 MG tablet Take as directed by coumadin clinic, Normal      STOP taking these medications     HYDROcodone-acetaminophen (NO[place]lands Ranch5-325 MG tablet        I sent the prescription of potassium 20 mEq daily PO to patient's [facility]  Brief H and P: For complete details please refer to admission H and P, but in brief 83 y.o.malewith medical history significant for chronic atrial fibrillation on AC with coumadin, myasthenia gravis, prostate cancer, basal cell CA, recently underwent a left TMTamputation and is coming to the emergency department with nausea, dry heaves and chills since the morning of the admission.  On admission. BP was 90/48 and has improved with IV fluids to 159/109. HR was 121, RR 18-33, T max was 101.66F, oxygen  saturation was 86% on room air but has improved to 95% with  oxygen support. Blood work was notable for WBC count 19.5, hemoglobin 12.1, potassium 2.8 which was supplemented. Lactic acid was 1.98. CXR showed small bilateral effusions but no focal consolidation. UA showed rare bacteria, no leukocytes. He was started on empiric broad spectrum abx until blood and urine cx results are back.  I assumed care on 09/26/16  Hospital Course:  Acute hypoxic respiratory failure/pulmonary edema and due to acute on chronic systolic CHF exacerbation - Likely due to volume overload -  2-D echo 2/27 showed EF of 35-40% with diffuse hypokinesis, massive bilateral enlargement, reduced RV systolic function, moderate TR, findings consistent with dilated cardiomyopathy - Chest x-ray 3/4 showed improving bilateral pleural effusions - Transitioned to oral Lasix,. No plans of TEE at this time given advanced age and multiple comorbid problems. Negative balance of 3.9 L. - Wean O2 as tolerated, currently O2 sats 93% on 2 L  Sepsis due to pseudomonas bacteremia and Candida Albicans now, source unclear at this time, RLE cellulitis  - Sepsis criteria met on admission with fever, tachycardia, tachypnea, leukocytosis, lactic acidosis - Blood cx on admission was positive for Pseudomonas.   - Initially started on vancomycin and Zosyn, ID was consulted, recommended meropenem, now completed for Pseudomonas bacteremia. - Repeat blood cultures 2/25 showed Candida albicans. The patient was started on Diflucan IV. Transitioned to oral Diflucan at the time of discharge, till April 9. - 2-D echo showed EF 35-40%, no vegetations. -  Repeat blood cultures 2/27 has been negative - No plans for TEE at this time due to advanced age and multiple comorbid conditions. ID recommended treat candidemia for 6 weeks with oral fluconazole 400 mg daily through April 9. Patient will need outpatient eye exam to rule out end ophthalmitis next week,  appointment scheduled this week per patient's daughter - Outpatient 2-D echo by cardiology.   RLE cellulitis/Left transmetatarsal amputation on 09/11/16 - Resolved - LE dopplers negative for DVT - seen by Dr Sharol Given- he can follow up in 2 weeks in his clinic.   Chronic atrial fibrillation (HCC) with RVR - CHADS vasc score 4 - On Anticoagulation with coumadin - Continue rate control with metoprolol  Severe mitral regurgitation,  - on hydralazine 5 mg qid for afterload reduction, increased to 10 mg on 03/05, is tolerating - If he tolerates hydralazine at 10 mg qid, consider adding long-acting nitrates, can be done outpatient by cardiology  Myasthenia gravis ([place]) - On chronic steroids, patient is supposed to be on prednisone 20 mg daily however was receiving a lower dose in the initial period of hospitalization - He received 1 dose of IV Solu-Medrol 60 mg 1, then placed on prednisone 40 mg for 3 days, then resume regular outpatient dose of 20 mg daily   Amputated toe, left (HCC) - Stable   Benign essential HTN - Continue metoprolol   Day of Discharge BP 117/77   Pulse 80   Temp 98.7  F (37.1 C) (Oral)   Resp 18   Ht 6' 3"  (1.905 m)   Wt 72.1 kg (158 lb 14.4 oz) Comment: scale b  SpO2 93%   BMI 19.86 kg/m   Physical Exam: General: Alert and awake oriented x3 not in any acute distress. HEENT: anicteric sclera, pupils reactive to light and accommodation CVS: S1-S2 clear irregular Chest: Decreased breath sounds at the bases Abdomen: soft nontender, nondistended, normal bowel sounds Extremities: no cyanosis, clubbing, RLE trace edema, TMT amputation left  Neuro: Cranial nerves II-XII intact, no focal neurological deficits   The results of significant diagnostics from this hospitalization (including imaging, microbiology, ancillary and laboratory) are listed below for reference.    LAB RESULTS: Basic Metabolic Panel:  Recent Labs Lab 09/27/16 0019   09/30/16 0458 10/02/16 0628  NA 142  < > 140 138  K 4.1  < > 4.0 3.4*  CL 99*  < > 99* 96*  CO2 35*  < > 36* 32  GLUCOSE 140*  < > 105* 105*  BUN 9  < > 9 21*  CREATININE 0.83  < > 0.75 0.79  CALCIUM 8.4*  < > 8.7* 8.9  MG 1.9  --   --   --   < > = values in this interval not displayed. Liver Function Tests: No results for input(s): AST, ALT, ALKPHOS, BILITOT, PROT, ALBUMIN in the last 168 hours. No results for input(s): LIPASE, AMYLASE in the last 168 hours. No results for input(s): AMMONIA in the last 168 hours. CBC:  Recent Labs Lab 09/27/16 0019 09/30/16 0458  WBC 10.3 11.6*  HGB 10.7* 12.6*  HCT 34.3* 40.2  MCV 102.7* 102.0*  PLT 206 259   Cardiac Enzymes:  Recent Labs Lab 09/26/16 1852 09/27/16 0019  TROPONINI 0.09* <0.03   BNP: Invalid input(s): POCBNP CBG:  Recent Labs Lab 09/30/16 0326  GLUCAP 125*    Significant Diagnostic Studies:  Dg Chest 2 View  Result Date: 09/20/2016 CLINICAL DATA:  Nausea chills and dry heaves EXAM: CHEST  2 VIEW COMPARISON:  06/14/2016 FINDINGS: Mildly low lung volumes. Extensive calcified pleural plaques. Small bilateral effusions. No focal consolidation. Stable moderate cardiomegaly. No pneumothorax. Atherosclerosis. Previous vertebral augmentation of the thoracolumbar spine. Moderate compression of a mid thoracic vertebra. IMPRESSION: 1. Small bilateral effusions.  No focal consolidation. 2. Stable moderate cardiomegaly without overt edema 3. Extensive calcified pleural plaques Electronically Signed   By: Donavan Foil M.D.   On: 09/20/2016 21:19    2D ECHO: Study Conclusions  - Left ventricle: The cavity size was severely dilated. Wall   thickness was normal. Systolic function was moderately reduced.   The estimated ejection fraction was in the range of 35% to 40%.   Diffuse hypokinesis. The study is not technically sufficient to   allow evaluation of LV diastolic function. - Aortic valve: Sclerosis without stenosis.  There was moderate   regurgitation. - Mitral valve: Mildly thickened leaflets . There was severe   regurgitation directed posteriorly. - Left atrium: Massively dilated (115 ml/m2). - Right ventricle: The cavity size was mildly dilated. Reduced RV   systolic function. Lateral annulus peak S velocity: 7.62 cm/s. - Right atrium: Massively dilated (90 ml/m2). - Tricuspid valve: There was moderate regurgitation. - Pulmonary arteries: PA peak pressure: 52 mm Hg (S). - Inferior vena cava: The vessel was dilated. The respirophasic   diameter changes were blunted (< 50%), consistent with elevated   central venous pressure. - Pericardium, extracardiac: A trivial pericardial effusion  was   identified posterior to the heart. There was a left pleural   effusion.  Disposition and Follow-up: Discharge Instructions    (HEART FAILURE PATIENTS) Call MD:  Anytime you have any of the following symptoms: 1) 3 pound weight gain in 24 hours or 5 pounds in 1 week 2) shortness of breath, with or without a dry hacking cough 3) swelling in the hands, feet or stomach 4) if you have to sleep on extra pillows at night in order to breathe.    Complete by:  As directed    Diet - low sodium heart healthy    Complete by:  As directed    Increase activity slowly    Complete by:  As directed        DISPOSITION: [facility] Follow up.   Why:  They will go to your home to do your home health physical therapy Contact information: [place] [address] [phone]        [name], MD. Go on 10/09/2016.   Specialty:  Internal Medicine Why:  @2 :15pm Contact information: [address], [address] [place] [address] Olean III, MD Follow up on 10/16/2016.   Specialty:  Cardiology Why:  @1 :30pm with Nell Range Contact information: [address] [place] [address] [phone]        Teodoro Spray L, OD Follow up on 10/04/2016.   Specialty:  Optometry Why:  please keep your appointment Contact information: [address] [place] [address] [phone]            Time spent on Discharge: 37 mins   Signed:   Adreonna Yontz M.D. [facility] 10/02/2016, 12:35 PM Pager: [phone]

## 2016-10-02 NOTE — Progress Notes (Signed)
Dressing changed on left foot.

## 2016-10-02 NOTE — Progress Notes (Signed)
Patients personal sitter helped patient to bedside commode x2, stated patient "didn't do anything".

## 2016-10-03 ENCOUNTER — Encounter (INDEPENDENT_AMBULATORY_CARE_PROVIDER_SITE_OTHER): Payer: Self-pay | Admitting: Orthopedic Surgery

## 2016-10-03 ENCOUNTER — Ambulatory Visit (INDEPENDENT_AMBULATORY_CARE_PROVIDER_SITE_OTHER): Payer: Medicare Other | Admitting: Orthopedic Surgery

## 2016-10-03 VITALS — Ht 75.0 in | Wt 158.0 lb

## 2016-10-03 DIAGNOSIS — T8131XA Disruption of external operation (surgical) wound, not elsewhere classified, initial encounter: Secondary | ICD-10-CM | POA: Insufficient documentation

## 2016-10-03 DIAGNOSIS — T8131XS Disruption of external operation (surgical) wound, not elsewhere classified, sequela: Secondary | ICD-10-CM

## 2016-10-03 MED ORDER — NITROGLYCERIN 0.2 MG/HR TD PT24: 0.2000 mg | MEDICATED_PATCH | Freq: Every day | TRANSDERMAL | 12 refills | Status: DC

## 2016-10-03 MED ORDER — SILVER SULFADIAZINE 1 % EX CREA: 1.0000 "application " | TOPICAL_CREAM | Freq: Every day | CUTANEOUS | 3 refills | Status: DC

## 2016-10-03 NOTE — Progress Notes (Signed)
Office Visit Note   Patient: Richard Bradley           Date of Birth: 1926/07/13           MRN: [id number] Visit Date: 10/03/2016              Requested by: [name], MD [place], [place], [place] [address] PCP: [name], MD  Chief Complaint  Patient presents with  . Left Foot - Routine Post Op    09/11/16 left transmet amputation    HPI: Pt is s/p a left transmet amputation 09/11/16. He states that he was just released from the hospital yesterday following a 2 week long stay for suspected pneumonia per the care giver. The incision is open and the stitches are present. He is non weight bearing in a post op shoe and wheelchair. There is spot amount of bloody drainage the foot is pink and slightly swollen. Pamella Pert, RMA     Assessment & Plan: Visit Diagnoses:  1. Postoperative wound dehiscence, sequela     Plan: We'll start Silvadene dressing changes and a nitroglycerin patch. Discussed the importance of nutrition patient states he's lost over 20 pounds. Continue nonweightbearing on the left foot. Discussed that this can either heal or require a transtibial amputation.  Follow-Up Instructions: Return in about 2 weeks (around 10/17/2016).   Ortho Exam On examination the wound has dehisced slightly there is good granulation tissue the base after debridement his foot is cold and atrophic. We will harvest the sutures today apply Silvadene dressing. ROS: Review of systems negative for fever or chills. Imaging: No results found.  Labs: Lab Results  Component Value Date   HGBA1C 5.6 09/26/2011   ESRSEDRATE 7 05/24/2016   ESRSEDRATE 29 (H) 10/12/2014   ESRSEDRATE 8 09/26/2011   CRP 6.4 (H) 05/24/2016   CRP 14.0 (H) 10/12/2014   CRP 0.02 09/26/2011   REPTSTATUS 09/29/2016 FINAL 09/24/2016   GRAMSTAIN  10/12/2014    RARE WBC PRESENT,BOTH PMN AND MONONUCLEAR NO ORGANISMS SEEN Performed at [facility]  10/12/2014   RARE WBC PRESENT,BOTH PMN AND MONONUCLEAR NO ORGANISMS SEEN Performed at [facility] NO GROWTH 5 DAYS 09/24/2016   LABORGA PSEUDOMONAS AERUGINOSA 09/20/2016    Orders:  No orders of the defined types were placed in this encounter.  Meds ordered this encounter  Medications  . nitroGLYCERIN (NITRODUR - DOSED IN MG/24 HR) 0.2 mg/hr patch    Sig: Place 1 patch (0.2 mg total) onto the skin daily.    Dispense:  30 patch    Refill:  12  . silver sulfADIAZINE (SILVADENE) 1 % cream    Sig: Apply 1 application topically daily. Apply to affected area daily plus dry dressing    Dispense:  400 g    Refill:  3     Procedures: No procedures performed  Clinical Data: No additional findings.  Subjective: Review of Systems  Objective: Vital Signs: Ht 6\' 3"  (1.905 m)   Wt 158 lb (71.7 kg)   BMI 19.75 kg/m   Specialty Comments:  No specialty comments available.  PMFS History: Patient Active Problem List   Diagnosis Date Noted  . Wound dehiscence, surgical 10/03/2016  . Acute respiratory distress   . Goals of care, counseling/discussion   . Palliative care by specialist   . Sepsis due to Gram negative bacteria ([place]) 09/21/2016  . Gram-negative bacteremia 09/21/2016  . Leukocytosis 09/21/2016  .  Hypokalemia 09/21/2016  . Benign essential HTN 09/21/2016  . Amputated toe, left ([place]) 06/18/2016  . Anticoagulated on Coumadin 12/20/2013  . Myasthenia gravis ([place]) 06/27/2011  . Chronic atrial fibrillation [facility])    Past Medical History:  Diagnosis Date  . Atrial fibrillation ([place])   . Cellulitis    left arm  . CHF (congestive heart failure) ([place])   . GERD (gastroesophageal reflux disease)    ocassional  . History of kidney stones   . Myasthenia gravis   . Osteoarthritis   . Pneumonia    Hstory of  . 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 Prostate cancer ([place]) dx'd 1982   treated with surgery and radiation.  Basal cell cancer  . Sepsis  ([place])   . Urine incontinence   . Varicose veins of left lower extremity     Family History  Problem Relation Age of Onset  . Heart disease Father   . Heart attack Father   . Hypertension Father   . Heart disease Brother   . Heart disease Paternal Grandfather   . Stroke Mother   . Parkinsonism Sister   . Heart disease Sister   . Dementia Sister     Past Surgical History:  Procedure Laterality Date  . AMPUTATION Left 03/20/2016   Procedure: Left Great Toe Amputation at Metatarsophalangeal Joint;  Surgeon: Newt Minion, MD;  Location: [facility];  Service: Orthopedics;  Laterality: Left;  . AMPUTATION Left 06/11/2016   Procedure: Left 2nd Toe Amputation;  Surgeon: Newt Minion, MD;  Location: [facility];  Service: Orthopedics;  Laterality: Left;  . AMPUTATION Left 07/12/2016   Procedure: LEFT 3rd TOE AMPUTATION;  Surgeon: Newt Minion, MD;  Location: [facility];  Service: Orthopedics;  Laterality: Left;  . AMPUTATION Left 09/11/2016   Procedure: Left Transmetatarsal Amputation;  Surgeon: Newt Minion, MD;  Location: [facility];  Service: Orthopedics;  Laterality: Left;  . appendectomy    . APPENDECTOMY    . COLONOSCOPY    . HERNIA REPAIR Bilateral    Inguinal  . I&D EXTREMITY Left 10/12/2014   Procedure: IRRIGATION AND DEBRIDEMENT INDEX FINGER;  Surgeon: Iran Planas, MD;  Location: [facility];  Service: Orthopedics;  Laterality: Left;  . KYPHOPLASTY  2012 ?   T 12- L1  . LAMINECTOMY  2005 ?   lumbar- 4-5  . PROSTATE SURGERY    . ROTATOR CUFF REPAIR Right   . TOTAL KNEE ARTHROPLASTY Right    Social History   Occupational History  . Retired [employer]   Social History Main Topics  . Smoking status: Never Smoker  . Smokeless tobacco: Never Used  . Alcohol use 0.0 oz/week     Comment: occasional beer  . Drug use: No  . Sexual activity: Not on file     Comment: Widowed

## 2016-10-04 ENCOUNTER — Ambulatory Visit (INDEPENDENT_AMBULATORY_CARE_PROVIDER_SITE_OTHER): Payer: Medicare Other | Admitting: *Deleted

## 2016-10-04 ENCOUNTER — Telehealth: Payer: Self-pay | Admitting: Pharmacist

## 2016-10-04 DIAGNOSIS — I482 Chronic atrial fibrillation, unspecified: Secondary | ICD-10-CM

## 2016-10-04 DIAGNOSIS — Z7901 Long term (current) use of anticoagulants: Secondary | ICD-10-CM | POA: Diagnosis not present

## 2016-10-04 DIAGNOSIS — Z5181 Encounter for therapeutic drug level monitoring: Secondary | ICD-10-CM

## 2016-10-04 LAB — POCT INR: INR: 4.9

## 2016-10-04 NOTE — Telephone Encounter (Signed)
-----   Message from Eileen Stanford, PA-C sent at 10/03/2016  3:18 PM EST -----  Frazier Butt, I have not seen this patient yet, but can we get him into coumadin clinic next available. See message below from pharmacist, Uvaldo Bristle, below. Thanks!!  ----- Message ----- From: Ihor Austin, [facility] Sent: 10/03/2016   9:23 AM To: Belva Crome, MD, Eileen Stanford, PA-C  Mr. Beadnell was recently hospitalized and was discharged on 3/7. He is on warfarin and had digoxin and fluconazole added during the admission.  I am concerned due to drug-drug interactions that his INR may be increased. He is scheduled for follow-up with you on 3/21 but I was wondering if he could get in earlier for an INR check. Thank you!

## 2016-10-04 NOTE — Telephone Encounter (Signed)
Pt scheduled for INR check on Monday 3/12.

## 2016-10-04 NOTE — Telephone Encounter (Signed)
LMOM for pt yesterday and today to call to schedule Coumadin appt.

## 2016-10-08 ENCOUNTER — Ambulatory Visit (INDEPENDENT_AMBULATORY_CARE_PROVIDER_SITE_OTHER): Payer: Medicare Other | Admitting: *Deleted

## 2016-10-08 DIAGNOSIS — I482 Chronic atrial fibrillation, unspecified: Secondary | ICD-10-CM

## 2016-10-08 DIAGNOSIS — Z5181 Encounter for therapeutic drug level monitoring: Secondary | ICD-10-CM

## 2016-10-08 DIAGNOSIS — Z7901 Long term (current) use of anticoagulants: Secondary | ICD-10-CM | POA: Diagnosis not present

## 2016-10-08 LAB — POCT INR: INR: 3.7

## 2016-10-09 ENCOUNTER — Ambulatory Visit: Payer: Medicare Other

## 2016-10-09 ENCOUNTER — Encounter: Payer: Self-pay | Admitting: Physician Assistant

## 2016-10-12 ENCOUNTER — Telehealth: Payer: Self-pay | Admitting: Physician Assistant

## 2016-10-12 NOTE — Telephone Encounter (Addendum)
Ms. Anitra Lauth called because her father was feeling badly. She had been checking his blood pressure and it was dropping into the 25Q systolic when he was getting up. It would be in the 982M systolic when he was sitting still. At his recent hospitalization, hydralazine was added and his Lasix dose was increased. He has been more somnolent like he was when his myasthenia gravis was undertreated when he was in the hospital. He is back on his previous home dose of prednisone.  She is concerned that he is dehydrated because his by mouth intake has been down since he is feeling very weak and has not been eating or drinking very much. He has been very nauseated.  He is not having any bleeding issues. He is not having any shortness of breath or chest pain. His weight is stable.  He apparently has orthostatic hypotension. He is on furosemide as well as hydralazine. He is to hold the furosemide today and restart it at 40 mg daily tomorrow (instead of twice a day). He is to hold the hydralazine.   I advised that the nausea could be secondary to multiple things, including the Diflucan. He needs this because he has Candida.  If these medications do not help his blood pressure, she is to call us back. If she feels the Diflucan is causing his nausea, she is to call the prescriber. If she feels his myasthenia gravis is not adequately treated because of the stress of his recent illness, she is to call the prescriber of the prednisone.  Lenoard Aden 10/12/2016 5:47 PM [facility] [phone]

## 2016-10-14 ENCOUNTER — Telehealth: Payer: Self-pay | Admitting: Neurology

## 2016-10-14 NOTE — Telephone Encounter (Signed)
Pt's daughter called said he was in the hospital for 13 days and has been home for about 2 wks. On the 10th day in the hospital she discovered he was on prednisone 10mg  instead of 20mg . She said he was given 60mg  x 1 day and 40mg  x 2 day to try to boost it and currently on 20mg  after that. He is now experiencing droopy eyelids, slurred speech, blurred vision (not double) for the past 5 days. She is wanting to know if the prednisone could be increased until his OV this Friday and if so what would be dose.

## 2016-10-14 NOTE — Telephone Encounter (Signed)
I called the daughter. The patient has had a recent hospitalization, he was not on his adequate dose of prednisone for part of that hospitalization, but they did eventually both of the medication. He is having some problems with slurring his speech, difficulty keeping his eyes open. We will increase the prednisone to 30 mg daily until I see him.

## 2016-10-15 NOTE — Progress Notes (Signed)
Cardiology Office Note    Date:  10/16/2016   ID:  Richard Bradley, DOB 1925/09/30, MRN [id number]  PCP:  [name], MD  Cardiologist:  Dr. Tamala Julian   CC: post hospital follow up.   History of Present Illness:  Richard Bradley is a 83 y.o. male with a history of permanent atrial fibrillation/flutter on coumadin for anticoagulation, chronic combined CHF, myasthenia gravis on prednisone, HTN, DM, PAD s/p left TMTamputation, severe MR, chronic dyspnea and pulmonary fibrosis who presents to clinic for post hospital follow up.   Seen by Dr. Tamala Julian 07/05/16 to establish care for cardiology. He moved from [place] Per review of echocardiograms in [place], EF has been up and down over the years. Last Echo in 05/2014 showed EF 35-40% with severe MR. Cardiac catheterization approximately 5-6 years ago at [facility] showed normal coronaries. He has a history of conjunctival hemorrhage and coumadin was stopped but then opthalmology cleared him to resume. Reviewed his long time previous cardiologist's note, Dr. Antonietta Breach who felt he was not a candidate for invasive work up and stable enough to be seen on a PRN basis. He did ask that he establish care with a cardiologist in [place].   He did move to [place]. He had transmetatarsal amputationleft foot 09/11/16 after failure of conservative wound care.   Recently admitted 3/2-10/02/16 with sepsis 2/2 a poly-organism bacteremia (due to pseudomonas and Candida Albicans), afib with RVR and acute CHF related to volume loading and diuretic withholding on admission. Echo this admission showed: LVEF 35-40%, severely dilated LV with global hypokinesis and normal wall thickness, moderate AI, severe posteriorly directed MR, massive biatrial enlargement, mild RVEwith reduced RV systolic function, moderate TR, RVSP 52 mmHg, dilated IVC, trivial pericardial effusion with left pleural effusion. Considering his advanced age and multiple comorbid conditions, he  was treated presumptively with a longer course of antibiotics rather than putting him through a TEE. His respiratory status was fairly tenuous and not felt stable enough for TEE. Plan was for a folllow-up echocardiogram in one month. It appeared his mitral regurgitation was long-standing based on review of outpatient records. Discharged on digoxin 0.0625 mg daily, diflucan, lasix 40mg  BID, prednisone 20mg  daily,  hydralazine 10mg  QID and coumadin.   Per review of phone notes he was feeling nauseated, dehydrated and having hypotension. PT has been to their home and he was having orthostatic hypotension. He was told to drop lasix to 40mg  daily but he has continued on lasix 40mg  BID. He did hold the  Digoxin and hydralazine. Neurology also increased prednisone to 30mg  daily due to worsening myasthenia gravis symptoms.   Today he presents to clinic for follow up. No CP or SOB. No LE edema, orthopnea or PND. No dizziness or syncope. No blood in stool or urine. No palpitations. Has developed a painful rash on his right leg. Also has significant nausea with dry heaving that has been ongoing since hospital stay. They think its from the diflucan. Daughter a PT who is here with him today.      Past Medical History:  Diagnosis Date  . Atrial fibrillation ([place])   . Cellulitis    left arm  . CHF (congestive heart failure) ([place])   . GERD (gastroesophageal reflux disease)    ocassional  . History of kidney stones   . Myasthenia gravis   . Osteoarthritis   . Pneumonia    Hstory of  . 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 Prostate cancer ([place]) dx'd 1982   treated  with surgery and radiation.  Basal cell cancer  . Sepsis ([place])   . Urine incontinence   . Varicose veins of left lower extremity     Past Surgical History:  Procedure Laterality Date  . AMPUTATION Left 03/20/2016   Procedure: Left Great Toe Amputation at Metatarsophalangeal Joint;  Surgeon: Newt Minion, MD;  Location: [facility];  Service: Orthopedics;  Laterality: Left;  . AMPUTATION Left 06/11/2016   Procedure: Left 2nd Toe Amputation;  Surgeon: Newt Minion, MD;  Location: [facility];  Service: Orthopedics;  Laterality: Left;  . AMPUTATION Left 07/12/2016   Procedure: LEFT 3rd TOE AMPUTATION;  Surgeon: Newt Minion, MD;  Location: [facility];  Service: Orthopedics;  Laterality: Left;  . AMPUTATION Left 09/11/2016   Procedure: Left Transmetatarsal Amputation;  Surgeon: Newt Minion, MD;  Location: [facility];  Service: Orthopedics;  Laterality: Left;  . appendectomy    . APPENDECTOMY    . COLONOSCOPY    . HERNIA REPAIR Bilateral    Inguinal  . I&D EXTREMITY Left 10/12/2014   Procedure: IRRIGATION AND DEBRIDEMENT INDEX FINGER;  Surgeon: Iran Planas, MD;  Location: [facility];  Service: Orthopedics;  Laterality: Left;  . KYPHOPLASTY  2012 ?   T 12- L1  . LAMINECTOMY  2005 ?   lumbar- 4-5  . PROSTATE SURGERY    . ROTATOR CUFF REPAIR Right   . TOTAL KNEE ARTHROPLASTY Right     Current Medications: Outpatient Medications Prior to Visit  Medication Sig Dispense Refill  . Cholecalciferol (VITAMIN D-3 PO) Take 2,000 Units by mouth daily.     Marland Kitchen escitalopram (LEXAPRO) 5 MG tablet Take 5 mg by mouth at bedtime.  3  . FIBER PO Take 1 tablet by mouth 3 (three) times daily.     . fluticasone (FLONASE) 50 MCG/ACT nasal spray Place 1 spray into both nostrils 2 (two) times daily.     . furosemide (LASIX) 40 MG tablet Take 1 tablet (40 mg total) by mouth 2 (two) times daily. 60 tablet 3  . guaiFENesin (MUCINEX) 600 MG 12 hr tablet Take 600 mg by mouth 2 (two) times daily as needed for to loosen phlegm.    Marland Kitchen HYDROcodone-acetaminophen (NORCO) 10-325 MG per tablet Take 1 tablet by mouth every 6 (six) hours as needed (for pain).     Marland Kitchen ipratropium (ATROVENT) 0.03 % nasal spray Place 2 sprays into both nostrils 3 (three) times daily as needed (for congestion).    . metoprolol tartrate (LOPRESSOR) 25 MG tablet Take 1 tablet (25 mg total) by  mouth 2 (two) times daily. 60 tablet 3  . nitroGLYCERIN (NITRODUR - DOSED IN MG/24 HR) 0.2 mg/hr patch Place 1 patch (0.2 mg total) onto the skin daily. 30 patch 12  . pantoprazole (PROTONIX) 40 MG tablet Take 1 tablet (40 mg total) by mouth daily. 30 tablet 3  . polyethylene glycol (MIRALAX / GLYCOLAX) packet Take 17 g by mouth daily as needed for moderate constipation. For constipation 30 each 0  . potassium chloride SA (K-DUR,KLOR-CON) 20 MEQ tablet Take 1 tablet (20 mEq total) by mouth daily. 30 tablet 3  . predniSONE (DELTASONE) 10 MG tablet Take 2 tablets (20 mg total) by mouth daily with breakfast. Take 40mg  for 1 more day, then continue 20mg  daily    . silver sulfADIAZINE (SILVADENE) 1 % cream Apply 1 application topically daily. Apply to affected area daily plus dry dressing 400 g 3  . vitamin B-12 (CYANOCOBALAMIN) 1000 MCG tablet  Take 1,000 mcg by mouth daily.    Marland Kitchen warfarin (COUMADIN) 5 MG tablet Take as directed by coumadin clinic 30 tablet 3  . fluconazole (DIFLUCAN) 200 MG tablet Take 2 tablets (400 mg total) by mouth daily. Stop on 11/04/16 64 tablet 0  . digoxin 62.5 MCG TABS Take 0.0625 mg by mouth daily. (Patient not taking: Reported on 10/16/2016) 30 tablet 3  . hydrALAZINE (APRESOLINE) 10 MG tablet Take 1 tablet (10 mg total) by mouth 4 (four) times daily. (Patient not taking: Reported on 10/16/2016) 120 tablet 3   No facility-administered medications prior to visit.      Allergies:   Tape   Social History   Social History  . Marital status: Widowed    Spouse name: N/A  . Number of children: 2  . Years of education: 12   Occupational History  . Retired [employer]   Social History Main Topics  . Smoking status: Never Smoker  . Smokeless tobacco: Never Used  . Alcohol use 0.0 oz/week     Comment: occasional beer  . Drug use: No  . Sexual activity: Not Asked     Comment: Widowed   Other Topics Concern  . None   Social History Narrative     From [place], moving to [place]   Widowed   Right-handed   Caffeine: 1 cup of coffee per day     Family History:  The patient's family history includes Dementia in his sister; Heart attack in his father; Heart disease in his brother, father, paternal grandfather, and sister; Hypertension in his father; Parkinsonism in his sister; Stroke in his mother.      ROS:   Please see the history of present illness.    ROS All other systems reviewed and are negative.   PHYSICAL EXAM:   VS:  BP (!) 100/50   Pulse 100   Ht 6\' 3"  (1.905 m)    GEN: Well nourished, well developed, in no acute distress  HEENT: normal  Neck: no JVD, carotid bruits, or masses Cardiac: irreg irreg; no murmurs, rubs, or gallops,no edema  Respiratory:  clear to auscultation bilaterally, normal work of breathing GI: soft, nontender, nondistended, + BS MS: no deformity or atrophy  Skin: warm and dry, sore rash on right thigh Neuro:  Alert and Oriented x 3, Strength and sensation are intact Psych: euthymic mood, full affect   Wt Readings from Last 3 Encounters:  10/03/16 158 lb (71.7 kg)  10/02/16 158 lb 14.4 oz (72.1 kg)  09/11/16 180 lb (81.6 kg)      Studies/Labs Reviewed:   EKG:  EKG is ordered today.  The ekg ordered today demonstrates atrial fibrillation  HR 100. PVC  Recent Labs: 09/20/2016: ALT 14 09/26/2016: B Natriuretic Peptide 624.0 09/27/2016: Magnesium 1.9 09/30/2016: Hemoglobin 12.6; Platelets 259 10/02/2016: BUN 21; Creatinine, Ser 0.79; Potassium 3.4; Sodium 138   Lipid Panel No results found for: CHOL, TRIG, HDL, CHOLHDL, VLDL, LDLCALC, LDLDIRECT  Additional studies/ records that were reviewed today include:  Echo: 09/24/16 Study Conclusions - Left ventricle: The cavity size was severely dilated. Wall thickness was normal. Systolic function was moderately reduced. The estimated ejection fraction was in the range of 35% to 40%. Diffuse hypokinesis. The study is not technically  sufficient to allow evaluation of LV diastolic function. - Aortic valve: Sclerosis without stenosis. There was moderate regurgitation. - Mitral valve: Mildly thickened leaflets . There was severe regurgitation directed posteriorly. - Left atrium:  Massively dilated (115 ml/m2). - Right ventricle: The cavity size was mildly dilated. Reduced RV systolic function. Lateral annulus peak S velocity: 7.62 cm/s. - Right atrium: Massively dilated (90 ml/m2). - Tricuspid valve: There was moderate regurgitation. - Pulmonary arteries: PA peak pressure: 52 mm Hg (S). - Inferior vena cava: The vessel was dilated. The respirophasic diameter changes were blunted (<50%), consistent with elevated central venous pressure. - Pericardium, extracardiac: A trivial pericardial effusion was identified posterior to the heart. There was a left pleural effusion. Recommendations: LVEF 35-40%, severely dilated LV with global hypokinesis and normal wall thickness, moderate AI, severe posteriorly directed MR, massive biatrial enlargement, mild RVE with reduced RV systolic function, moderate TR, RVSP 52 mmHg, dilated IVC, trivial pericardial effusion with left pleural effusion. Findings consistent with dilated cardiomyopathy.   ASSESSMENT & PLAN:   Chronic systolic CHF: appears euvolemic on lasix 40mg  BID. Continue Kdur 20 Meq daily. Continue Lopressor 25mg  BID. He has stopped digoxin and hydralazine due to symptomatic orthostatic hypotension. BP still soft today. Will check a BMET today.   Chronic atrial fibrillation: HR with moderate control today, HR 100. Continue Lopressor 25mg  BID. Continue coumadin for CHADSVASC of at least 5 (CHF, HTN, age, vasc dz). Seen in our coumadin clinic today and INR 8. Will hold coumadin and get a stat PT/INR  HTN: BP soft  Severe MR: has been severe since at least 05/2014. This is nothing new.   PAD: s/p L transmetatarsal amputation on 09/11/16  Myasthenia  gravis: continue prednisone 30mg  daily.   Pseudomonas and candida bacteremia: he has been on diflucan for at least 3 weeks and has had ongoing significant nausea with dry heaving and now has a rash. Difficult to say what caused this. I discussed case with Dr. Linus Salmons with infectious disease and he was okay with stopping diflucan and repeating blood cultures in 1 week. The patient is very pleased with this as he cannot tolerate this medication well at all.   Total time spent with patient was over 40 minutes which included evaluating patient, reviewing record and coordinating care. Face to face time >50%. I paged Dr. Linus Salmons with infectious disease to discuss plan of care. Spent a lot of time in with family and reviewing notes in [facility]    Medication Adjustments/Labs and Tests Ordered: Current medicines are reviewed at length with the patient today.  Concerns regarding medicines are outlined above.  Medication changes, Labs and Tests ordered today are listed in the Patient Instructions below. Patient Instructions  Medication Instructions:  1. CONTINUE TO STAY OFF THE DIGOXIN AND THE HYDRALAZINE  2. YOU WILL CONTINUE ALL OTHER MEDICATIONS AS PRESCRIBED  3. AS OF TODAY 10/16/16 PER KATIE Esteen Delpriore, PAC YOU WILL STOP THE FLUCONAZOLE (DIFLUCAN)  Labwork: TODAY BMET AND STAT PT/INR   Testing/Procedures: NONE ORDERED  Follow-Up: KATIE Morrill Bomkamp ON 10/25/16 @ 2 PM  Any Other Special Instructions Will Be Listed Below (If Applicable).     If you need a refill on your cardiac medications before your next appointment, please call your pharmacy.      Signed, Angelena Form, PA-C  10/16/2016 2:39 PM    [facility] [address], [place], [place]  [address] Phone: [phone]; Fax: [phone]

## 2016-10-16 ENCOUNTER — Encounter: Payer: Self-pay | Admitting: Physician Assistant

## 2016-10-16 ENCOUNTER — Ambulatory Visit (INDEPENDENT_AMBULATORY_CARE_PROVIDER_SITE_OTHER): Payer: Medicare Other | Admitting: *Deleted

## 2016-10-16 ENCOUNTER — Ambulatory Visit (INDEPENDENT_AMBULATORY_CARE_PROVIDER_SITE_OTHER): Payer: Medicare Other | Admitting: Physician Assistant

## 2016-10-16 VITALS — BP 100/50 | HR 100 | Ht 75.0 in

## 2016-10-16 DIAGNOSIS — G7000 Myasthenia gravis without (acute) exacerbation: Secondary | ICD-10-CM

## 2016-10-16 DIAGNOSIS — Z5181 Encounter for therapeutic drug level monitoring: Secondary | ICD-10-CM

## 2016-10-16 DIAGNOSIS — R7881 Bacteremia: Secondary | ICD-10-CM

## 2016-10-16 DIAGNOSIS — I5022 Chronic systolic (congestive) heart failure: Secondary | ICD-10-CM | POA: Diagnosis not present

## 2016-10-16 DIAGNOSIS — I340 Nonrheumatic mitral (valve) insufficiency: Secondary | ICD-10-CM

## 2016-10-16 DIAGNOSIS — I739 Peripheral vascular disease, unspecified: Secondary | ICD-10-CM | POA: Diagnosis not present

## 2016-10-16 DIAGNOSIS — I482 Chronic atrial fibrillation, unspecified: Secondary | ICD-10-CM

## 2016-10-16 DIAGNOSIS — Z7901 Long term (current) use of anticoagulants: Secondary | ICD-10-CM

## 2016-10-16 LAB — PROTIME-INR: PROTHROMBIN TIME: 102.5 s — AB (ref 9.1–12.0)

## 2016-10-16 LAB — POCT INR: INR: 8

## 2016-10-16 MED ORDER — PHYTONADIONE 5 MG PO TABS: ORAL_TABLET | ORAL | 0 refills | Status: DC

## 2016-10-16 NOTE — Patient Instructions (Addendum)
Medication Instructions:  1. CONTINUE TO STAY OFF THE DIGOXIN AND THE HYDRALAZINE  2. YOU WILL CONTINUE ALL OTHER MEDICATIONS AS PRESCRIBED  3. AS OF TODAY 10/16/16 PER KATIE THOMPSON, PAC YOU WILL STOP THE FLUCONAZOLE (DIFLUCAN)  Labwork: TODAY BMET AND STAT PT/INR   Testing/Procedures: NONE ORDERED  Follow-Up: KATIE THOMPSON ON 10/25/16 @ 2 PM  Any Other Special Instructions Will Be Listed Below (If Applicable).     If you need a refill on your cardiac medications before your next appointment, please call your pharmacy.

## 2016-10-17 ENCOUNTER — Encounter (INDEPENDENT_AMBULATORY_CARE_PROVIDER_SITE_OTHER): Payer: Self-pay | Admitting: Orthopedic Surgery

## 2016-10-17 ENCOUNTER — Ambulatory Visit (INDEPENDENT_AMBULATORY_CARE_PROVIDER_SITE_OTHER): Payer: Medicare Other | Admitting: Orthopedic Surgery

## 2016-10-17 DIAGNOSIS — T8131XA Disruption of external operation (surgical) wound, not elsewhere classified, initial encounter: Secondary | ICD-10-CM

## 2016-10-17 LAB — BASIC METABOLIC PANEL
BUN/Creatinine Ratio: 36 — ABNORMAL HIGH (ref 10–24)
BUN: 42 mg/dL — ABNORMAL HIGH (ref 8–27)
CO2: 22 mmol/L (ref 18–29)
Calcium: 8.9 mg/dL (ref 8.6–10.2)
Chloride: 94 mmol/L — ABNORMAL LOW (ref 96–106)
Creatinine, Ser: 1.16 mg/dL (ref 0.76–1.27)
GFR calc Af Amer: 65 mL/min/{1.73_m2} (ref >59)
GFR calc non Af Amer: 56 mL/min/{1.73_m2} — ABNORMAL LOW (ref >59)
GLUCOSE: 146 mg/dL — AB (ref 65–99)
POTASSIUM: 4.6 mmol/L (ref 3.5–5.2)
SODIUM: 137 mmol/L (ref 134–144)

## 2016-10-17 MED ORDER — COLLAGENASE 250 UNIT/GM EX OINT: 1.0000 "application " | TOPICAL_OINTMENT | Freq: Every day | CUTANEOUS | 3 refills | Status: DC

## 2016-10-17 NOTE — Progress Notes (Signed)
Office Visit Note   Patient: Richard Bradley           Date of Birth: 1926/05/20           MRN: [id number] Visit Date: 10/17/2016              Requested by: [name], MD [place], [address] [place], [place] [id number] PCP: [name], MD  Chief Complaint  Patient presents with  . Left Foot - Follow-up    Left transmetatarsal amputation 09/11/16    HPI: Patient denies any symptoms has dehiscence which is stable of the transmetatarsal amputation on the left.  Assessment & Plan: Visit Diagnoses:  1. Postoperative wound dehiscence, initial encounter     Plan: We'll harvest the sutures today start Santyl dressing changes discontinue the Silvadene dressing changes continue nonweightbearing continue with the nitroglycerin patch change daily.  Follow-Up Instructions: Return in about 2 weeks (around 10/31/2016).   Ortho Exam  Patient is alert, oriented, no adenopathy, well-dressed, normal affect, normal respiratory effort. Patient ambulance in a wheelchair. Examination the wound dehiscence have some fibrinous exudative tissue there is no ascending cellulitis no exposed bone or tendon.  Imaging: No results found.  Labs: Lab Results  Component Value Date   HGBA1C 5.6 09/26/2011   ESRSEDRATE 7 05/24/2016   ESRSEDRATE 29 (H) 10/12/2014   ESRSEDRATE 8 09/26/2011   CRP 6.4 (H) 05/24/2016   CRP 14.0 (H) 10/12/2014   CRP 0.02 09/26/2011   REPTSTATUS 09/29/2016 FINAL 09/24/2016   GRAMSTAIN  10/12/2014    RARE WBC PRESENT,BOTH PMN AND MONONUCLEAR NO ORGANISMS SEEN Performed at [facility]  10/12/2014    RARE WBC PRESENT,BOTH PMN AND MONONUCLEAR NO ORGANISMS SEEN Performed at [facility] NO GROWTH 5 DAYS 09/24/2016   LABORGA PSEUDOMONAS AERUGINOSA 09/20/2016    Orders:  No orders of the defined types were placed in this encounter.  Meds ordered this encounter  Medications  . collagenase (SANTYL) ointment    Sig:  Apply 1 application topically daily. Apply to the affected area daily plus dry dressing    Dispense:  90 g    Refill:  3     Procedures: No procedures performed  Clinical Data: No additional findings.  ROS: Review of Systems  All other systems reviewed and are negative.   Objective: Vital Signs: There were no vitals taken for this visit.  Specialty Comments:  No specialty comments available.  PMFS History: Patient Active Problem List   Diagnosis Date Noted  . Wound dehiscence, surgical 10/03/2016  . Acute respiratory distress   . Goals of care, counseling/discussion   . Palliative care by specialist   . Sepsis due to Gram negative bacteria ([place]) 09/21/2016  . Gram-negative bacteremia 09/21/2016  . Leukocytosis 09/21/2016  . Hypokalemia 09/21/2016  . Benign essential HTN 09/21/2016  . Amputated toe, left ([place]) 06/18/2016  . Anticoagulated on Coumadin 12/20/2013  . Myasthenia gravis ([place]) 06/27/2011  . Chronic atrial fibrillation [facility])    Past Medical History:  Diagnosis Date  . Atrial fibrillation ([place])   . Cellulitis    left arm  . CHF (congestive heart failure) ([place])   . GERD (gastroesophageal reflux disease)    ocassional  . History of kidney stones   . Myasthenia gravis   . Osteoarthritis   . Pneumonia    Hstory of  . 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 Prostate cancer ([place]) dx'd 1982   treated  with surgery and radiation.  Basal cell cancer  . Sepsis ([place])   . Urine incontinence   . Varicose veins of left lower extremity     Family History  Problem Relation Age of Onset  . Heart disease Father   . Heart attack Father   . Hypertension Father   . Heart disease Brother   . Heart disease Paternal Grandfather   . Stroke Mother   . Parkinsonism Sister   . Heart disease Sister   . Dementia Sister     Past Surgical History:  Procedure Laterality Date  . AMPUTATION Left 03/20/2016   Procedure: Left Great Toe Amputation at  Metatarsophalangeal Joint;  Surgeon: Newt Minion, MD;  Location: [facility];  Service: Orthopedics;  Laterality: Left;  . AMPUTATION Left 06/11/2016   Procedure: Left 2nd Toe Amputation;  Surgeon: Newt Minion, MD;  Location: [facility];  Service: Orthopedics;  Laterality: Left;  . AMPUTATION Left 07/12/2016   Procedure: LEFT 3rd TOE AMPUTATION;  Surgeon: Newt Minion, MD;  Location: [facility];  Service: Orthopedics;  Laterality: Left;  . AMPUTATION Left 09/11/2016   Procedure: Left Transmetatarsal Amputation;  Surgeon: Newt Minion, MD;  Location: [facility];  Service: Orthopedics;  Laterality: Left;  . appendectomy    . APPENDECTOMY    . COLONOSCOPY    . HERNIA REPAIR Bilateral    Inguinal  . I&D EXTREMITY Left 10/12/2014   Procedure: IRRIGATION AND DEBRIDEMENT INDEX FINGER;  Surgeon: Iran Planas, MD;  Location: [facility];  Service: Orthopedics;  Laterality: Left;  . KYPHOPLASTY  2012 ?   T 12- L1  . LAMINECTOMY  2005 ?   lumbar- 4-5  . PROSTATE SURGERY    . ROTATOR CUFF REPAIR Right   . TOTAL KNEE ARTHROPLASTY Right    Social History   Occupational History  . Retired [employer]   Social History Main Topics  . Smoking status: Never Smoker  . Smokeless tobacco: Never Used  . Alcohol use 0.0 oz/week     Comment: occasional beer  . Drug use: No  . Sexual activity: Not on file     Comment: Widowed

## 2016-10-18 ENCOUNTER — Telehealth: Payer: Self-pay | Admitting: Neurology

## 2016-10-18 ENCOUNTER — Ambulatory Visit (INDEPENDENT_AMBULATORY_CARE_PROVIDER_SITE_OTHER): Payer: Medicare Other | Admitting: Pharmacist

## 2016-10-18 ENCOUNTER — Encounter: Payer: Self-pay | Admitting: Neurology

## 2016-10-18 ENCOUNTER — Ambulatory Visit (INDEPENDENT_AMBULATORY_CARE_PROVIDER_SITE_OTHER): Payer: Medicare Other | Admitting: Neurology

## 2016-10-18 VITALS — BP 95/78 | HR 43 | Ht 75.0 in

## 2016-10-18 DIAGNOSIS — G7000 Myasthenia gravis without (acute) exacerbation: Secondary | ICD-10-CM | POA: Diagnosis not present

## 2016-10-18 DIAGNOSIS — I482 Chronic atrial fibrillation, unspecified: Secondary | ICD-10-CM

## 2016-10-18 DIAGNOSIS — Z7901 Long term (current) use of anticoagulants: Secondary | ICD-10-CM | POA: Diagnosis not present

## 2016-10-18 DIAGNOSIS — Z5181 Encounter for therapeutic drug level monitoring: Secondary | ICD-10-CM | POA: Diagnosis not present

## 2016-10-18 LAB — POCT INR: INR: 3.9

## 2016-10-18 MED ORDER — PREDNISONE 5 MG PO TABS: 5.0000 mg | ORAL_TABLET | Freq: Every day | ORAL | 3 refills | Status: DC

## 2016-10-18 NOTE — Progress Notes (Signed)
Reason for visit: Myasthenia gravis  Richard Bradley is an 83 y.o. male  History of present illness:  Richard Bradley is an 83 year old right-handed white male with a history of myasthenia gravis. The patient has recently been in the hospital on the 23rd of February 2018 with a right leg cellulitis, he had Pseudomonas sepsis and congestive heart failure associated with some shortness of breath requiring oxygen therapy. His ejection fraction on the 2D echocardiogram was 35-40%. The patient is doing better with his breathing now, he has noted some persistent blurring of vision since being out of the hospital, he has been increased on his prednisone from 20-30 mg daily. He is able to chew and swallow well without choking. He is breathing well. He denies any weakness of extremities. He returns for follow-up today. If he covers one eye or the other, that blurring of vision goes away.   Past Medical History:  Diagnosis Date  . Atrial fibrillation ([place])   . Cellulitis    left arm  . CHF (congestive heart failure) ([place])   . GERD (gastroesophageal reflux disease)    ocassional  . History of kidney stones   . Myasthenia gravis   . Osteoarthritis   . Pneumonia    Hstory of  . 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 Prostate cancer ([place]) dx'd 1982   treated with surgery and radiation.  Basal cell cancer  . Sepsis ([place])   . Urine incontinence   . Varicose veins of left lower extremity     Past Surgical History:  Procedure Laterality Date  . AMPUTATION Left 03/20/2016   Procedure: Left Great Toe Amputation at Metatarsophalangeal Joint;  Surgeon: Newt Minion, MD;  Location: [facility];  Service: Orthopedics;  Laterality: Left;  . AMPUTATION Left 06/11/2016   Procedure: Left 2nd Toe Amputation;  Surgeon: Newt Minion, MD;  Location: [facility];  Service: Orthopedics;  Laterality: Left;  . AMPUTATION Left 07/12/2016   Procedure: LEFT 3rd TOE AMPUTATION;  Surgeon: Newt Minion, MD;   Location: [facility];  Service: Orthopedics;  Laterality: Left;  . AMPUTATION Left 09/11/2016   Procedure: Left Transmetatarsal Amputation;  Surgeon: Newt Minion, MD;  Location: [facility];  Service: Orthopedics;  Laterality: Left;  . appendectomy    . APPENDECTOMY    . COLONOSCOPY    . HERNIA REPAIR Bilateral    Inguinal  . I&D EXTREMITY Left 10/12/2014   Procedure: IRRIGATION AND DEBRIDEMENT INDEX FINGER;  Surgeon: Iran Planas, MD;  Location: [facility];  Service: Orthopedics;  Laterality: Left;  . KYPHOPLASTY  2012 ?   T 12- L1  . LAMINECTOMY  2005 ?   lumbar- 4-5  . PROSTATE SURGERY    . ROTATOR CUFF REPAIR Right   . TOTAL KNEE ARTHROPLASTY Right     Family History  Problem Relation Age of Onset  . Heart disease Father   . Heart attack Father   . Hypertension Father   . Heart disease Brother   . Heart disease Paternal Grandfather   . Stroke Mother   . Parkinsonism Sister   . Heart disease Sister   . Dementia Sister     Social history:  reports that he has never smoked. He has never used smokeless tobacco. He reports that he drinks alcohol. He reports that he does not use drugs.    Allergies  Allergen Reactions  . Tape Other (See Comments)    PATIENT'S SKIN IS VERY THIN AND WILL TEAR AND BRUISE  VERY EASILY!!    Medications:  Prior to Admission medications   Medication Sig Start Date End Date Taking? Authorizing Provider  Cholecalciferol (VITAMIN D-3 PO) Take 2,000 Units by mouth daily.    Yes Historical Provider, MD  collagenase (SANTYL) ointment Apply 1 application topically daily. Apply to the affected area daily plus dry dressing 10/17/16  Yes Newt Minion, MD  escitalopram (LEXAPRO) 5 MG tablet Take 5 mg by mouth at bedtime. 09/15/16  Yes Historical Provider, MD  FIBER PO Take 1 tablet by mouth 3 (three) times daily.    Yes Historical Provider, MD  fluticasone (FLONASE) 50 MCG/ACT nasal spray Place 1 spray into both nostrils 2 (two) times daily.    Yes Historical Provider, MD    furosemide (LASIX) 40 MG tablet Take 1 tablet (40 mg total) by mouth 2 (two) times daily. 10/02/16  Yes Ripudeep Krystal Eaton, MD  guaiFENesin (MUCINEX) 600 MG 12 hr tablet Take 600 mg by mouth 2 (two) times daily as needed for to loosen phlegm.   Yes Historical Provider, MD  HYDROcodone-acetaminophen (NORCO) 10-325 MG per tablet Take 1 tablet by mouth every 6 (six) hours as needed (for pain).    Yes Historical Provider, MD  ipratropium (ATROVENT) 0.03 % nasal spray Place 2 sprays into both nostrils 3 (three) times daily as needed (for congestion).   Yes Historical Provider, MD  metoprolol tartrate (LOPRESSOR) 25 MG tablet Take 1 tablet (25 mg total) by mouth 2 (two) times daily. 10/02/16  Yes Ripudeep Krystal Eaton, MD  nitroGLYCERIN (NITRODUR - DOSED IN MG/24 HR) 0.2 mg/hr patch Place 1 patch (0.2 mg total) onto the skin daily. 10/03/16  Yes Newt Minion, MD  pantoprazole (PROTONIX) 40 MG tablet Take 1 tablet (40 mg total) by mouth daily. 10/02/16  Yes Ripudeep Krystal Eaton, MD  phytonadione (VITAMIN K) 5 MG tablet Take 1/2 tablet (2.5mg ) NOW. 10/16/16  Yes Belva Crome, MD  polyethylene glycol [facility]) packet Take 17 g by mouth daily as needed for moderate constipation. For constipation 10/02/16  Yes Ripudeep Krystal Eaton, MD  potassium chloride SA (K-DUR,KLOR-CON) 20 MEQ tablet Take 1 tablet (20 mEq total) by mouth daily. 10/02/16  Yes Ripudeep Krystal Eaton, MD  predniSONE (DELTASONE) 10 MG tablet Take 2 tablets (20 mg total) by mouth daily with breakfast. Take 40mg  for 1 more day, then continue 20mg  daily 10/02/16  Yes Ripudeep K Rai, MD  silver sulfADIAZINE (SILVADENE) 1 % cream Apply 1 application topically daily. Apply to affected area daily plus dry dressing 10/03/16  Yes Newt Minion, MD  vitamin B-12 (CYANOCOBALAMIN) 1000 MCG tablet Take 1,000 mcg by mouth daily.   Yes Historical Provider, MD  warfarin (COUMADIN) 5 MG tablet Take as directed by coumadin clinic 07/11/16  Yes Belva Crome, MD  predniSONE (DELTASONE) 5 MG tablet  Take 1 tablet (5 mg total) by mouth daily with breakfast. 10/18/16   Kathrynn Ducking, MD    ROS:  Out of a complete 14 system review of symptoms, the patient complains only of the following symptoms, and all other reviewed systems are negative.  Decreased activity, appetite change Blurred vision Shortness of breath Leg swelling Back pain Skin wounds Bruising easily  Blood pressure 95/78, pulse (!) 43, height 6\' 3"  (1.905 m), SpO2 95 %.  Physical Exam  General: The patient is alert and cooperative at the time of the examination.  Skin: No significant peripheral edema is noted, the left foot is in a  healing shoe, amputation of the toes noted.   Neurologic Exam  Mental status: The patient is alert and oriented x 3 at the time of the examination. The patient has apparent normal recent and remote memory, with an apparently normal attention span and concentration ability.   Cranial nerves: Facial symmetry is present. Speech is normal, no aphasia or dysarthria is noted. Extraocular movements are full. Visual fields are full. With superior gaze for 1 minute, no ptosis or divergence of gaze was noted. The patient subjectively did not perceive overt double vision  Motor: The patient has good strength in all 4 extremities, with exception of 4/5 strength with hip flexion on the right. With the arms outstretched 1 minute, no fatigable weakness of the deltoid muscles was noted.  Sensory examination: Soft touch sensation is symmetric on the face, arms, and legs.  Coordination: The patient has good finger-nose-finger and heel-to-shin bilaterally.  Gait and station: The patient could not be ambulated, he is not weightbearing following surgery on the left foot.  Reflexes: Deep tendon reflexes are symmetric, but are depressed.   Assessment/Plan:  1. Myasthenia gravis  The patient will be reduced on the prednisone taking 25 mg daily for 3 weeks, then drop back to his usual dose of 20 mg  daily. He will follow-up in 4 months. He will contact our office if any new symptoms are noted.  Jill Alexanders MD 10/18/2016 8:32 AM  [facility] [address] [place], [place] [address]  Phone [phone] Fax [phone]

## 2016-10-18 NOTE — Telephone Encounter (Signed)
Patient's daughter calling. He was seen today by Dr. Jannifer Franklin and a Rx for predniSONE (DELTASONE) 5 MG tablet [facility]. It needs to be sent to [facility] on [address] @ [place] and cancel the one sent to [facility]. Thank you.

## 2016-10-18 NOTE — Patient Instructions (Signed)
   We will drop to 25 mg daily of the prednisone for 3 weeks, then go to 20 mg daily. Call if there are any concerns.

## 2016-10-18 NOTE — Addendum Note (Signed)
Addended by: Rossie Muskrat L on: 10/18/2016 10:01 AM   Modules accepted: Orders

## 2016-10-18 NOTE — Telephone Encounter (Signed)
Called and spoke to [facility] Cx rx sent there.  escribed rx to [facility] on [address] as requested.

## 2016-10-23 ENCOUNTER — Ambulatory Visit (INDEPENDENT_AMBULATORY_CARE_PROVIDER_SITE_OTHER): Payer: Medicare Other | Admitting: *Deleted

## 2016-10-23 DIAGNOSIS — I482 Chronic atrial fibrillation, unspecified: Secondary | ICD-10-CM

## 2016-10-23 DIAGNOSIS — Z5181 Encounter for therapeutic drug level monitoring: Secondary | ICD-10-CM

## 2016-10-23 DIAGNOSIS — Z7901 Long term (current) use of anticoagulants: Secondary | ICD-10-CM

## 2016-10-23 LAB — POCT INR: INR: 6

## 2016-10-24 NOTE — Progress Notes (Signed)
Cardiology Office Note    Date:  10/26/2016   ID:  Richard Bradley, DOB March 26, 1926, MRN [id number]  PCP:  Richard Contes, MD  Cardiologist:  Dr. Tamala Bradley   CC: post hospital follow up.   History of Present Illness:  Richard Bradley is a 83 y.o. male with a history of permanent atrial fibrillation/flutter on coumadin for anticoagulation, chronic combined CHF, myasthenia gravis on prednisone, HTN, DM, PAD s/p left TMTamputation, severe MR, chronic dyspnea and pulmonary fibrosis who presents to clinic for follow up.   Seen by Dr. Tamala Bradley 07/05/16 to establish care for cardiology. He moved from [place] Per review of echocardiograms in [place], EF has been up and down over the years. Last Echo in 05/2014 showed EF 35-40% with severe MR. Cardiac catheterization approximately 5-6 years ago at [facility] showed normal coronaries. He has a history of conjunctival hemorrhage and coumadin was stopped but then opthalmology cleared him to resume. Reviewed his long time previous cardiologist's note, Dr. Antonietta Bradley who felt he was not a candidate for invasive work up and stable enough to be seen on a PRN basis. He did ask that he establish care with a cardiologist in [place].   He did move to [place]. He had transmetatarsal amputationleft foot 09/11/16 after failure of conservative wound care.   Recently admitted 3/2-10/02/16 with sepsis 2/2 a poly-organism bacteremia (due to pseudomonas and Candida Albicans), afib with RVR and acute CHF related to volume loading and diuretic withholding on admission. Echo this admission showed: LVEF 35-40%, severely dilated LV with global hypokinesis and normal wall thickness, moderate AI, severe posteriorly directed MR, massive biatrial enlargement, mild RVEwith reduced RV systolic function, moderate TR, RVSP 52 mmHg, dilated IVC, trivial pericardial effusion with left pleural effusion. Considering his advanced age and multiple comorbid conditions, he was treated  presumptively with a longer course of antibiotics rather than putting him through a TEE. His respiratory status was fairly tenuous and not felt stable enough for TEE. Plan was for a folllow-up echocardiogram in one month. It appeared his mitral regurgitation was long-standing based on review of outpatient records. Discharged on digoxin 0.0625 mg daily, diflucan, lasix 40mg  BID, prednisone 20mg  daily,  hydralazine 10mg  QID and coumadin.   Per review of phone notes he was feeling nauseated, dehydrated and having hypotension. He was told to drop lasix to 40mg  daily but he has continued on lasix 40mg  BID. He did hold the Digoxin and hydralazine. Neurology also increased prednisone to 30mg  daily due to worsening myasthenia gravis symptoms.   I saw him in clinic for follow up on 10/16/16. He was doing well but having intractable nausea and developed a painful rash. He and family felt that it was related to the diflucan. I contacted Dr. Linus Bradley with infectious disease who gave me the permission to stop diflucan with plans for blood cultures the follow week. Fortunately he appeared euvolemic and his HR was ~100. His INR was >10 and his coumadin was held.   Today he presents to clinic for follow up. No CP or SOB. No LE edema, orthopnea or PND. No dizziness or syncope. No blood in stool or urine. No palpitations. He has been feeling really good and is very pleased with this. Had a great day yesterday. Today his eyes have felt a little tired. Here today with daughter who is a Richard Bradley.   Past Medical History:  Diagnosis Date  . Atrial fibrillation ([place])   . Cellulitis    left arm  . CHF (congestive  heart failure) ([place])   . GERD (gastroesophageal reflux disease)    ocassional  . History of kidney stones   . Myasthenia gravis   . Osteoarthritis   . Pneumonia    Hstory of  . 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 Prostate cancer ([place]) dx'd 1982   treated with surgery and radiation.  Basal cell  cancer  . Sepsis ([place])   . Urine incontinence   . Varicose veins of left lower extremity     Past Surgical History:  Procedure Laterality Date  . AMPUTATION Left 03/20/2016   Procedure: Left Great Toe Amputation at Metatarsophalangeal Joint;  Surgeon: Newt Minion, MD;  Location: [facility];  Service: Orthopedics;  Laterality: Left;  . AMPUTATION Left 06/11/2016   Procedure: Left 2nd Toe Amputation;  Surgeon: Newt Minion, MD;  Location: [facility];  Service: Orthopedics;  Laterality: Left;  . AMPUTATION Left 07/12/2016   Procedure: LEFT 3rd TOE AMPUTATION;  Surgeon: Newt Minion, MD;  Location: [facility];  Service: Orthopedics;  Laterality: Left;  . AMPUTATION Left 09/11/2016   Procedure: Left Transmetatarsal Amputation;  Surgeon: Newt Minion, MD;  Location: [facility];  Service: Orthopedics;  Laterality: Left;  . appendectomy    . APPENDECTOMY    . COLONOSCOPY    . HERNIA REPAIR Bilateral    Inguinal  . I&D EXTREMITY Left 10/12/2014   Procedure: IRRIGATION AND DEBRIDEMENT INDEX FINGER;  Surgeon: Iran Planas, MD;  Location: [facility];  Service: Orthopedics;  Laterality: Left;  . KYPHOPLASTY  2012 ?   T 12- L1  . LAMINECTOMY  2005 ?   lumbar- 4-5  . PROSTATE SURGERY    . ROTATOR CUFF REPAIR Right   . TOTAL KNEE ARTHROPLASTY Right     Current Medications: Outpatient Medications Prior to Visit  Medication Sig Dispense Refill  . Cholecalciferol (VITAMIN D-3 PO) Take 2,000 Units by mouth daily.     . collagenase (SANTYL) ointment Apply 1 application topically daily. Apply to the affected area daily plus dry dressing 90 g 3  . escitalopram (LEXAPRO) 5 MG tablet Take 5 mg by mouth at bedtime.  3  . FIBER PO Take 1 tablet by mouth 3 (three) times daily.     . fluticasone (FLONASE) 50 MCG/ACT nasal spray Place 1 spray into both nostrils 2 (two) times daily.     . furosemide (LASIX) 40 MG tablet Take 1 tablet (40 mg total) by mouth 2 (two) times daily. 60 tablet 3  . guaiFENesin (MUCINEX) 600 MG 12 hr  tablet Take 600 mg by mouth 2 (two) times daily as needed for to loosen phlegm.    Marland Kitchen HYDROcodone-acetaminophen (NORCO) 10-325 MG per tablet Take 1 tablet by mouth every 6 (six) hours as needed (for pain).     Marland Kitchen ipratropium (ATROVENT) 0.03 % nasal spray Place 2 sprays into both nostrils 3 (three) times daily as needed (for congestion).    . metoprolol tartrate (LOPRESSOR) 25 MG tablet Take 1 tablet (25 mg total) by mouth 2 (two) times daily. 60 tablet 3  . nitroGLYCERIN (NITRODUR - DOSED IN MG/24 HR) 0.2 mg/hr patch Place 1 patch (0.2 mg total) onto the skin daily. 30 patch 12  . pantoprazole (PROTONIX) 40 MG tablet Take 1 tablet (40 mg total) by mouth daily. 30 tablet 3  . phytonadione (VITAMIN K) 5 MG tablet Take 1/2 tablet (2.5mg ) NOW. 1 tablet 0  . polyethylene glycol (MIRALAX / GLYCOLAX) packet Take 17 g by mouth daily as  needed for moderate constipation. For constipation 30 each 0  . potassium chloride SA (K-DUR,KLOR-CON) 20 MEQ tablet Take 1 tablet (20 mEq total) by mouth daily. 30 tablet 3  . predniSONE (DELTASONE) 10 MG tablet Take 2 tablets (20 mg total) by mouth daily with breakfast. Take 40mg  for 1 more day, then continue 20mg  daily    . predniSONE (DELTASONE) 5 MG tablet Take 1 tablet (5 mg total) by mouth daily with breakfast. 30 tablet 3  . silver sulfADIAZINE (SILVADENE) 1 % cream Apply 1 application topically daily. Apply to affected area daily plus dry dressing 400 g 3  . vitamin B-12 (CYANOCOBALAMIN) 1000 MCG tablet Take 1,000 mcg by mouth daily.    Marland Kitchen warfarin (COUMADIN) 5 MG tablet Take as directed by coumadin clinic 30 tablet 3   No facility-administered medications prior to visit.      Allergies:   Tape   Social History   Social History  . Marital status: Widowed    Spouse name: N/A  . Number of children: 2  . Years of education: 12   Occupational History  . Retired [employer]   Social History Main Topics  . Smoking status: Never Smoker   . Smokeless tobacco: Never Used  . Alcohol use 0.0 oz/week     Comment: occasional beer  . Drug use: No  . Sexual activity: Not Asked     Comment: Widowed   Other Topics Concern  . None   Social History Narrative   From [place], moving to [place]   Widowed   Right-handed   Caffeine: 1 cup of coffee per day     Family History:  The patient's family history includes Dementia in his sister; Heart attack in his father; Heart disease in his brother, father, paternal grandfather, and sister; Hypertension in his father; Parkinsonism in his sister; Stroke in his mother.      ROS:   Please see the history of present illness.    ROS All other systems reviewed and are negative.   PHYSICAL EXAM:   VS:  BP 130/78   Pulse 65   Ht 6\' 3"  (1.905 m)   Wt 160 lb (72.6 kg)   SpO2 91%   BMI 20.00 kg/m    GEN: Well nourished, well developed, in no acute distress , chroincally ill appearing and frail, in wheelchair HEENT: normal  Neck: no JVD, carotid bruits, or masses Cardiac: irreg irreg; murmur at apex , rubs, or gallops,no edema  Respiratory:  clear to auscultation bilaterally, normal work of breathing GI: soft, nontender, nondistended, + BS MS: no deformity or atrophy s/p L toe amputation  Skin: warm and dr Neuro:  Alert and Oriented x 3, Strength and sensation are intact Psych: euthymic mood, full affect   Wt Readings from Last 3 Encounters:  10/25/16 160 lb (72.6 kg)  10/03/16 158 lb (71.7 kg)  10/02/16 158 lb 14.4 oz (72.1 kg)      Studies/Labs Reviewed:   EKG:  EKG is NOT  ordered today.     Recent Labs: 09/20/2016: ALT 14 09/26/2016: B Natriuretic Peptide 624.0 09/27/2016: Magnesium 1.9 09/30/2016: Hemoglobin 12.6; Platelets 259 10/25/2016: BUN 27; Creatinine, Ser 0.98; Potassium 4.6; Sodium 137   Lipid Panel No results found for: CHOL, TRIG, HDL, CHOLHDL, VLDL, LDLCALC, LDLDIRECT  Additional studies/ records that were reviewed today include:  Echo:  09/24/16 Study Conclusions - Left ventricle: The cavity size was severely dilated. Wall thickness was normal. Systolic  function was moderately reduced. The estimated ejection fraction was in the range of 35% to 40%. Diffuse hypokinesis. The study is not technically sufficient to allow evaluation of LV diastolic function. - Aortic valve: Sclerosis without stenosis. There was moderate regurgitation. - Mitral valve: Mildly thickened leaflets . There was severe regurgitation directed posteriorly. - Left atrium: Massively dilated (115 ml/m2). - Right ventricle: The cavity size was mildly dilated. Reduced RV systolic function. Lateral annulus peak S velocity: 7.62 cm/s. - Right atrium: Massively dilated (90 ml/m2). - Tricuspid valve: There was moderate regurgitation. - Pulmonary arteries: PA peak pressure: 52 mm Hg (S). - Inferior vena cava: The vessel was dilated. The respirophasic diameter changes were blunted (<50%), consistent with elevated central venous pressure. - Pericardium, extracardiac: A trivial pericardial effusion was identified posterior to the heart. There was a left pleural effusion. Recommendations: LVEF 35-40%, severely dilated LV with global hypokinesis and normal wall thickness, moderate AI, severe posteriorly directed MR, massive biatrial enlargement, mild RVE with reduced RV systolic function, moderate TR, RVSP 52 mmHg, dilated IVC, trivial pericardial effusion with left pleural effusion. Findings consistent with dilated cardiomyopathy.   ASSESSMENT & PLAN:   Chronic systolic CHF: appears euvolemic on lasix 40mg  BID. Continue Kdur 31mEq daily. Continue Lopressor 25mg  BID. Digoxin, hydralazine discontinued 2/2 orthostatic hypotension.  BP much improved today. Will check BMET to follow renal function and electrolytes.   Chronic atrial fibrillation: HR much better control today ~65. Continue Lopresor 25mg  BID. Continue coumadin for CHADSVASC of  at least 5 (CHF, HTN, age, vasc dz). INR has been supratheraputic and our coumadin clinic is managing his coumadin   HTN: BP well controlled today  Severe MR: has been severe since at least 05/2014. This is nothing new and has remained stable over time. Conservative management  PAD: s/p L transmetarsal amputation 09/11/16  Myasthenia gravis: continue prednisone.   Pseudomonas and candida bacteremia: Flagly stopped last week given intractable nausea. Will get blood cultures today.    Medication Adjustments/Labs and Tests Ordered: Current medicines are reviewed at length with the patient today.  Concerns regarding medicines are outlined above.  Medication changes, Labs and Tests ordered today are listed in the Patient Instructions below. Patient Instructions  Medication Instructions:  Your physician has recommended you make the following change in your medication:    Labwork: TODAY:  BLOOD CULTURES X'S 2 & BMET  Testing/Procedures: None ordered  Follow-Up: Your physician recommends that you schedule a follow-up appointment in: [place]    Any Other Special Instructions Will Be Listed Below (If Applicable).   If you need a refill on your cardiac medications before your next appointment, please call your pharmacy.      Mable Fill, PA-C  10/26/2016 11:34 AM    [facility] [place], [place], [place]  [address] Phone: [phone]; Fax: [phone]

## 2016-10-25 ENCOUNTER — Encounter: Payer: Self-pay | Admitting: Physician Assistant

## 2016-10-25 ENCOUNTER — Ambulatory Visit (INDEPENDENT_AMBULATORY_CARE_PROVIDER_SITE_OTHER): Payer: Medicare Other | Admitting: Physician Assistant

## 2016-10-25 VITALS — BP 130/78 | HR 65 | Ht 75.0 in | Wt 160.0 lb

## 2016-10-25 DIAGNOSIS — Z5181 Encounter for therapeutic drug level monitoring: Secondary | ICD-10-CM | POA: Diagnosis not present

## 2016-10-25 DIAGNOSIS — I739 Peripheral vascular disease, unspecified: Secondary | ICD-10-CM

## 2016-10-25 DIAGNOSIS — R7881 Bacteremia: Secondary | ICD-10-CM

## 2016-10-25 DIAGNOSIS — I5022 Chronic systolic (congestive) heart failure: Secondary | ICD-10-CM

## 2016-10-25 DIAGNOSIS — I340 Nonrheumatic mitral (valve) insufficiency: Secondary | ICD-10-CM

## 2016-10-25 DIAGNOSIS — G7000 Myasthenia gravis without (acute) exacerbation: Secondary | ICD-10-CM | POA: Diagnosis not present

## 2016-10-25 DIAGNOSIS — I482 Chronic atrial fibrillation, unspecified: Secondary | ICD-10-CM

## 2016-10-25 LAB — BASIC METABOLIC PANEL
BUN / CREAT RATIO: 28 — AB (ref 10–24)
BUN: 27 mg/dL (ref 8–27)
CHLORIDE: 101 mmol/L (ref 96–106)
CO2: 29 mmol/L (ref 18–29)
Calcium: 8.4 mg/dL — ABNORMAL LOW (ref 8.6–10.2)
Creatinine, Ser: 0.98 mg/dL (ref 0.76–1.27)
GFR calc Af Amer: 79 mL/min/{1.73_m2} (ref >59)
GFR calc non Af Amer: 69 mL/min/{1.73_m2} (ref >59)
GLUCOSE: 157 mg/dL — AB (ref 65–99)
Potassium: 4.6 mmol/L (ref 3.5–5.2)
Sodium: 137 mmol/L (ref 134–144)

## 2016-10-25 NOTE — Patient Instructions (Addendum)
Medication Instructions:  Your physician has recommended you make the following change in your medication:    Labwork: TODAY:  BLOOD CULTURES X'S 2 & BMET  Testing/Procedures: None ordered  Follow-Up: Your physician recommends that you schedule a follow-up appointment in: [place]    Any Other Special Instructions Will Be Listed Below (If Applicable).   If you need a refill on your cardiac medications before your next appointment, please call your pharmacy.

## 2016-10-28 ENCOUNTER — Ambulatory Visit (INDEPENDENT_AMBULATORY_CARE_PROVIDER_SITE_OTHER): Payer: Medicare Other | Admitting: Pharmacist

## 2016-10-28 DIAGNOSIS — Z5181 Encounter for therapeutic drug level monitoring: Secondary | ICD-10-CM

## 2016-10-28 DIAGNOSIS — I482 Chronic atrial fibrillation, unspecified: Secondary | ICD-10-CM

## 2016-10-28 DIAGNOSIS — Z7901 Long term (current) use of anticoagulants: Secondary | ICD-10-CM | POA: Diagnosis not present

## 2016-10-28 LAB — POCT INR: INR: 2.2

## 2016-10-31 ENCOUNTER — Encounter (INDEPENDENT_AMBULATORY_CARE_PROVIDER_SITE_OTHER): Payer: Self-pay | Admitting: Orthopedic Surgery

## 2016-10-31 ENCOUNTER — Ambulatory Visit (INDEPENDENT_AMBULATORY_CARE_PROVIDER_SITE_OTHER): Payer: Medicare Other | Admitting: Family

## 2016-10-31 ENCOUNTER — Telehealth: Payer: Self-pay | Admitting: Physician Assistant

## 2016-10-31 DIAGNOSIS — Z89439 Acquired absence of unspecified foot: Secondary | ICD-10-CM

## 2016-10-31 DIAGNOSIS — T8131XS Disruption of external operation (surgical) wound, not elsewhere classified, sequela: Secondary | ICD-10-CM

## 2016-10-31 LAB — CULTURE, BLOOD (SINGLE)

## 2016-10-31 NOTE — Telephone Encounter (Signed)
New Message    Have you got the culture results back yet from [facility]

## 2016-10-31 NOTE — Progress Notes (Signed)
Post-Op Visit Note   Patient: Richard Bradley           Date of Birth: [birth year]           MRN: [id number] Visit Date: 10/31/2016 PCP: Henrine Screws, MD  Chief Complaint:  Chief Complaint  Patient presents with  . Left Foot - Follow-up    Left transmet 09/11/16    HPI:  The patient is an 83 year old gentleman who presents today for evaluation of left transmetatarsal amputation. This has dehisced. He has been doing Santyl dressing changes with a home health aide assisting.   Denies fever or chills.  Ortho Exam The left transmetatarsal amputation has dehisced laterally. This has nonviable tissue in the little ischemic changes on the plantar aspect. There is no tract. There is no drainage. There is no erythema no cellulitis. Palpable bone.  Visit Diagnoses:  1. Postoperative wound dehiscence, sequela   2. Status post transmetatarsal amputation of foot, unspecified laterality ([place])     Plan: Continue with daily wound care. Nonviable tissue. Pack the wound open. We will follow this closely. The patient may be touchdown weightbearing through the heel to get from his bedroom to the day. Return precautions discussed.  Follow-Up Instructions: Return in about 2 weeks (around 11/14/2016).   Imaging: No results found.  Orders:  No orders of the defined types were placed in this encounter.  No orders of the defined types were placed in this encounter.    PMFS History: Patient Active Problem List   Diagnosis Date Noted  . Wound dehiscence, surgical 10/03/2016  . Acute respiratory distress   . Goals of care, counseling/discussion   . Palliative care by specialist   . Sepsis due to Gram negative bacteria ([place]) 09/21/2016  . Gram-negative bacteremia 09/21/2016  . Leukocytosis 09/21/2016  . Hypokalemia 09/21/2016  . Benign essential HTN 09/21/2016  . Amputated toe, left ([place]) 06/18/2016  . Anticoagulated on Coumadin 12/20/2013  . Myasthenia gravis ([place]) 06/27/2011  .  Chronic atrial fibrillation [facility])    Past Medical History:  Diagnosis Date  . Atrial fibrillation ([place])   . Cellulitis    left arm  . CHF (congestive heart failure) ([place])   . GERD (gastroesophageal reflux disease)    ocassional  . History of kidney stones   . Myasthenia gravis   . Osteoarthritis   . Pneumonia    Hstory of  . 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 Prostate cancer ([place]) dx'd 1982   treated with surgery and radiation.  Basal cell cancer  . Sepsis ([place])   . Urine incontinence   . Varicose veins of left lower extremity     Family History  Problem Relation Age of Onset  . Heart disease Father   . Heart attack Father   . Hypertension Father   . Heart disease Brother   . Heart disease Paternal Grandfather   . Stroke Mother   . Parkinsonism Sister   . Heart disease Sister   . Dementia Sister     Past Surgical History:  Procedure Laterality Date  . AMPUTATION Left 03/20/2016   Procedure: Left Great Toe Amputation at Metatarsophalangeal Joint;  Surgeon: Newt Minion, MD;  Location: [place];  Service: Orthopedics;  Laterality: Left;  . AMPUTATION Left 06/11/2016   Procedure: Left 2nd Toe Amputation;  Surgeon: Newt Minion, MD;  Location: [place];  Service: Orthopedics;  Laterality: Left;  . AMPUTATION Left 07/12/2016   Procedure: LEFT 3rd TOE AMPUTATION;  Surgeon: Illene Regulus  Sharol Given, MD;  Location: [place];  Service: Orthopedics;  Laterality: Left;  . AMPUTATION Left 09/11/2016   Procedure: Left Transmetatarsal Amputation;  Surgeon: Newt Minion, MD;  Location: [place];  Service: Orthopedics;  Laterality: Left;  . appendectomy    . APPENDECTOMY    . COLONOSCOPY    . HERNIA REPAIR Bilateral    Inguinal  . I&D EXTREMITY Left 10/12/2014   Procedure: IRRIGATION AND DEBRIDEMENT INDEX FINGER;  Surgeon: Iran Planas, MD;  Location: [place];  Service: Orthopedics;  Laterality: Left;  . KYPHOPLASTY  2012 ?   T 12- L1  . LAMINECTOMY  2005 ?   lumbar- 4-5  . PROSTATE  SURGERY    . ROTATOR CUFF REPAIR Right   . TOTAL KNEE ARTHROPLASTY Right    Social History   Occupational History  . Retired [employer]   Social History Main Topics  . Smoking status: Never Smoker  . Smokeless tobacco: Never Used  . Alcohol use 0.0 oz/week     Comment: occasional beer  . Drug use: No  . Sexual activity: Not on file     Comment: Widowed

## 2016-11-01 NOTE — Telephone Encounter (Signed)
Returned pts daughter, [name], [place] on file, call and let her know that pts  lab results were good.  She verbalized understanding.

## 2016-11-04 ENCOUNTER — Ambulatory Visit (INDEPENDENT_AMBULATORY_CARE_PROVIDER_SITE_OTHER): Payer: Medicare Other | Admitting: *Deleted

## 2016-11-04 DIAGNOSIS — Z5181 Encounter for therapeutic drug level monitoring: Secondary | ICD-10-CM | POA: Diagnosis not present

## 2016-11-04 DIAGNOSIS — I482 Chronic atrial fibrillation, unspecified: Secondary | ICD-10-CM

## 2016-11-04 DIAGNOSIS — Z7901 Long term (current) use of anticoagulants: Secondary | ICD-10-CM | POA: Diagnosis not present

## 2016-11-04 LAB — POCT INR: INR: 1.5

## 2016-11-14 ENCOUNTER — Ambulatory Visit (INDEPENDENT_AMBULATORY_CARE_PROVIDER_SITE_OTHER): Payer: Medicare Other | Admitting: *Deleted

## 2016-11-14 ENCOUNTER — Ambulatory Visit (INDEPENDENT_AMBULATORY_CARE_PROVIDER_SITE_OTHER): Payer: Medicare Other | Admitting: Orthopedic Surgery

## 2016-11-14 DIAGNOSIS — Z7901 Long term (current) use of anticoagulants: Secondary | ICD-10-CM | POA: Diagnosis not present

## 2016-11-14 DIAGNOSIS — T8131XS Disruption of external operation (surgical) wound, not elsewhere classified, sequela: Secondary | ICD-10-CM

## 2016-11-14 DIAGNOSIS — Z5181 Encounter for therapeutic drug level monitoring: Secondary | ICD-10-CM

## 2016-11-14 DIAGNOSIS — I482 Chronic atrial fibrillation, unspecified: Secondary | ICD-10-CM

## 2016-11-14 LAB — POCT INR: INR: 1.3

## 2016-11-14 NOTE — Progress Notes (Signed)
Office Visit Note   Patient: Richard Bradley           Date of Birth: 1926-02-14           MRN: [id number] Visit Date: 11/14/2016              Requested by: Josetta Huddle, MD [address] [place], [place] [address] PCP: Henrine Screws, MD  No chief complaint on file.     HPI: The patient is an 83 year old gentleman who presents today in follow-up for dehiscence of his left transmetatarsal amputation. is having daily dressing changes with a home nurse. They are using Santyl and Silvadene. Does have him proved granulation today. Is touch down weight bearing through heel with a walker around home. Family feel the Santyl is no longer helping, note increased cost.   Assessment & Plan: Visit Diagnoses:  1. Postoperative wound dehiscence, sequela     Plan: follow up in 4 weeks. Continue with wound care. May transition to silvadene dressings when santyl runs out. Continue packing wound open. May WTB through heel around home. Elevation as able.   Follow-Up Instructions: Return in about 4 weeks (around 12/12/2016).   Ortho Exam  Patient is alert, oriented, no adenopathy, well-dressed, normal affect, normal respiratory effort. Dehiscence of left transmetatarsal amputation. This shows improved granulation tissue. Continued 50% fibrinous exudative tissue in wound bed. There is exposed bone laterally. No odor. No drainage today. No ascending cellulitis.   Imaging: No results found.  Labs: Lab Results  Component Value Date   HGBA1C 5.6 09/26/2011   ESRSEDRATE 7 05/24/2016   ESRSEDRATE 29 (H) 10/12/2014   ESRSEDRATE 8 09/26/2011   CRP 6.4 (H) 05/24/2016   CRP 14.0 (H) 10/12/2014   CRP 0.02 09/26/2011   REPTSTATUS 09/29/2016 FINAL 09/24/2016   GRAMSTAIN  10/12/2014    RARE WBC PRESENT,BOTH PMN AND MONONUCLEAR NO ORGANISMS SEEN Performed at [place]  10/12/2014    RARE WBC PRESENT,BOTH PMN AND MONONUCLEAR NO ORGANISMS SEEN Performed at  [place] NO GROWTH 5 DAYS 09/24/2016   LABORGA PSEUDOMONAS AERUGINOSA 09/20/2016    Orders:  No orders of the defined types were placed in this encounter.  No orders of the defined types were placed in this encounter.    Procedures: No procedures performed  Clinical Data: No additional findings.  ROS:  All other systems negative, except as noted in the HPI. Review of Systems  Constitutional: Negative for chills and fever.  Skin: Positive for wound.    Objective: Vital Signs: There were no vitals taken for this visit.  Specialty Comments:  No specialty comments available.  PMFS History: Patient Active Problem List   Diagnosis Date Noted  . Wound dehiscence, surgical 10/03/2016  . Acute respiratory distress   . Goals of care, counseling/discussion   . Palliative care by specialist   . Sepsis due to Gram negative bacteria ([place]) 09/21/2016  . Gram-negative bacteremia 09/21/2016  . Leukocytosis 09/21/2016  . Hypokalemia 09/21/2016  . Benign essential HTN 09/21/2016  . Amputated toe, left ([place]) 06/18/2016  . Anticoagulated on Coumadin 12/20/2013  . Myasthenia gravis ([place]) 06/27/2011  . Chronic atrial fibrillation [facility])    Past Medical History:  Diagnosis Date  . Atrial fibrillation ([place])   . Cellulitis    left arm  . CHF (congestive heart failure) ([place])   . GERD (gastroesophageal reflux disease)    ocassional  . History of kidney stones   .  Myasthenia gravis   . Osteoarthritis   . Pneumonia    Hstory of  . 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 Prostate cancer ([place]) dx'd 1982   treated with surgery and radiation.  Basal cell cancer  . Sepsis ([place])   . Urine incontinence   . Varicose veins of left lower extremity     Family History  Problem Relation Age of Onset  . Heart disease Father   . Heart attack Father   . Hypertension Father   . Heart disease Brother   . Heart disease Paternal Grandfather   . Stroke  Mother   . Parkinsonism Sister   . Heart disease Sister   . Dementia Sister     Past Surgical History:  Procedure Laterality Date  . AMPUTATION Left 03/20/2016   Procedure: Left Great Toe Amputation at Metatarsophalangeal Joint;  Surgeon: Newt Minion, MD;  Location: [place];  Service: Orthopedics;  Laterality: Left;  . AMPUTATION Left 06/11/2016   Procedure: Left 2nd Toe Amputation;  Surgeon: Newt Minion, MD;  Location: [place];  Service: Orthopedics;  Laterality: Left;  . AMPUTATION Left 07/12/2016   Procedure: LEFT 3rd TOE AMPUTATION;  Surgeon: Newt Minion, MD;  Location: [place];  Service: Orthopedics;  Laterality: Left;  . AMPUTATION Left 09/11/2016   Procedure: Left Transmetatarsal Amputation;  Surgeon: Newt Minion, MD;  Location: [place];  Service: Orthopedics;  Laterality: Left;  . appendectomy    . APPENDECTOMY    . COLONOSCOPY    . HERNIA REPAIR Bilateral    Inguinal  . I&D EXTREMITY Left 10/12/2014   Procedure: IRRIGATION AND DEBRIDEMENT INDEX FINGER;  Surgeon: Iran Planas, MD;  Location: [place];  Service: Orthopedics;  Laterality: Left;  . KYPHOPLASTY  2012 ?   T 12- L1  . LAMINECTOMY  2005 ?   lumbar- 4-5  . PROSTATE SURGERY    . ROTATOR CUFF REPAIR Right   . TOTAL KNEE ARTHROPLASTY Right    Social History   Occupational History  . Retired [employer]   Social History Main Topics  . Smoking status: Never Smoker  . Smokeless tobacco: Never Used  . Alcohol use 0.0 oz/week     Comment: occasional beer  . Drug use: No  . Sexual activity: Not on file     Comment: Widowed

## 2016-11-21 ENCOUNTER — Ambulatory Visit (INDEPENDENT_AMBULATORY_CARE_PROVIDER_SITE_OTHER): Payer: Medicare Other | Admitting: Pharmacist

## 2016-11-21 DIAGNOSIS — I482 Chronic atrial fibrillation, unspecified: Secondary | ICD-10-CM

## 2016-11-21 DIAGNOSIS — Z5181 Encounter for therapeutic drug level monitoring: Secondary | ICD-10-CM | POA: Diagnosis not present

## 2016-11-21 DIAGNOSIS — Z7901 Long term (current) use of anticoagulants: Secondary | ICD-10-CM | POA: Diagnosis not present

## 2016-11-21 LAB — POCT INR: INR: 2.1

## 2016-11-27 ENCOUNTER — Other Ambulatory Visit: Payer: Self-pay | Admitting: Family Medicine

## 2016-11-27 DIAGNOSIS — J309 Allergic rhinitis, unspecified: Secondary | ICD-10-CM

## 2016-11-28 NOTE — Telephone Encounter (Signed)
06/2016 last ov

## 2016-12-09 ENCOUNTER — Other Ambulatory Visit (INDEPENDENT_AMBULATORY_CARE_PROVIDER_SITE_OTHER): Payer: Self-pay | Admitting: Family

## 2016-12-09 ENCOUNTER — Encounter (INDEPENDENT_AMBULATORY_CARE_PROVIDER_SITE_OTHER): Payer: Self-pay | Admitting: Orthopedic Surgery

## 2016-12-09 ENCOUNTER — Ambulatory Visit (INDEPENDENT_AMBULATORY_CARE_PROVIDER_SITE_OTHER): Payer: Medicare Other | Admitting: Orthopedic Surgery

## 2016-12-09 DIAGNOSIS — Z89422 Acquired absence of other left toe(s): Secondary | ICD-10-CM

## 2016-12-09 DIAGNOSIS — S98132A Complete traumatic amputation of one left lesser toe, initial encounter: Secondary | ICD-10-CM

## 2016-12-09 DIAGNOSIS — T8781 Dehiscence of amputation stump: Secondary | ICD-10-CM

## 2016-12-09 NOTE — Progress Notes (Signed)
Office Visit Note   Patient: Richard Bradley           Date of Birth: [birth year]           MRN: [id number] Visit Date: 12/09/2016              Requested by: Josetta Huddle, MD [address] [place], [place] [address] PCP: Josetta Huddle, MD  Chief Complaint  Patient presents with  . Left Foot - Follow-up      HPI: Patient status post revision transmetatarsal amputation on the left he is using nitroglycerin patch and dressing changes daily.  Assessment & Plan: Visit Diagnoses:  1. Amputated toe, left ([place])   2. Dehiscence of amputation stump ([place])     Plan: Patient has had three quarters of the wound dehisced. There were 3 metatarsals which are exposed dry and desiccated. Discussed recommendation to proceed with a transtibial amputation. Patient states that he will not consider a transtibial amputation and we will plan for revision of the transmetatarsal amputation. Discussed increased risk of the wound not healing. Discussed risks need for another surgery.  Follow-Up Instructions: Return in about 2 weeks (around 12/23/2016).   Ortho Exam  Patient is alert, oriented, no adenopathy, well-dressed, normal affect, normal respiratory effort. Examination patient is ambulating in a wheelchair. Examination is a palpable dorsalis pedis pulse he has approximately 50% granulation tissue in the wound bed the wound is 6 cm x 1 cm x 1 cm deep. There is exposed fifth fourth and third metatarsals with desiccated dry bone there is no ascending cellulitis no odor no drainage.  Imaging: No results found.  Labs: Lab Results  Component Value Date   HGBA1C 5.6 09/26/2011   ESRSEDRATE 7 05/24/2016   ESRSEDRATE 29 (H) 10/12/2014   ESRSEDRATE 8 09/26/2011   CRP 6.4 (H) 05/24/2016   CRP 14.0 (H) 10/12/2014   CRP 0.02 09/26/2011   REPTSTATUS 09/29/2016 FINAL 09/24/2016   GRAMSTAIN  10/12/2014    RARE WBC PRESENT,BOTH PMN AND MONONUCLEAR NO ORGANISMS SEEN Performed at [place]  10/12/2014    RARE WBC PRESENT,BOTH PMN AND MONONUCLEAR NO ORGANISMS SEEN Performed at [place] NO GROWTH 5 DAYS 09/24/2016   LABORGA PSEUDOMONAS AERUGINOSA 09/20/2016    Orders:  No orders of the defined types were placed in this encounter.  No orders of the defined types were placed in this encounter.    Procedures: No procedures performed  Clinical Data: No additional findings.  ROS:  All other systems negative, except as noted in the HPI. Review of Systems  Objective: Vital Signs: There were no vitals taken for this visit.  Specialty Comments:  No specialty comments available.  PMFS History: Patient Active Problem List   Diagnosis Date Noted  . Wound dehiscence, surgical 10/03/2016  . Acute respiratory distress   . Goals of care, counseling/discussion   . Palliative care by specialist   . Sepsis due to Gram negative bacteria ([place]) 09/21/2016  . Gram-negative bacteremia 09/21/2016  . Leukocytosis 09/21/2016  . Hypokalemia 09/21/2016  . Benign essential HTN 09/21/2016  . Amputated toe, left ([place]) 06/18/2016  . Anticoagulated on Coumadin 12/20/2013  . Myasthenia gravis ([place]) 06/27/2011  . Chronic atrial fibrillation [facility])    Past Medical History:  Diagnosis Date  . Atrial fibrillation ([place])   . Cellulitis    left arm  . CHF (congestive heart failure) ([place])   . GERD (gastroesophageal reflux  disease)    ocassional  . History of kidney stones   . Myasthenia gravis   . Osteoarthritis   . Pneumonia    Hstory of  . 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 Prostate cancer ([place]) dx'd 1982   treated with surgery and radiation.  Basal cell cancer  . Sepsis ([place])   . Urine incontinence   . Varicose veins of left lower extremity     Family History  Problem Relation Age of Onset  . Heart disease Father   . Heart attack Father   . Hypertension Father   . Heart disease Brother   . Heart disease  Paternal Grandfather   . Stroke Mother   . Parkinsonism Sister   . Heart disease Sister   . Dementia Sister     Past Surgical History:  Procedure Laterality Date  . AMPUTATION Left 03/20/2016   Procedure: Left Great Toe Amputation at Metatarsophalangeal Joint;  Surgeon: Newt Minion, MD;  Location: [place];  Service: Orthopedics;  Laterality: Left;  . AMPUTATION Left 06/11/2016   Procedure: Left 2nd Toe Amputation;  Surgeon: Newt Minion, MD;  Location: [place];  Service: Orthopedics;  Laterality: Left;  . AMPUTATION Left 07/12/2016   Procedure: LEFT 3rd TOE AMPUTATION;  Surgeon: Newt Minion, MD;  Location: [place];  Service: Orthopedics;  Laterality: Left;  . AMPUTATION Left 09/11/2016   Procedure: Left Transmetatarsal Amputation;  Surgeon: Newt Minion, MD;  Location: [place];  Service: Orthopedics;  Laterality: Left;  . appendectomy    . APPENDECTOMY    . COLONOSCOPY    . HERNIA REPAIR Bilateral    Inguinal  . I&D EXTREMITY Left 10/12/2014   Procedure: IRRIGATION AND DEBRIDEMENT INDEX FINGER;  Surgeon: Iran Planas, MD;  Location: [place];  Service: Orthopedics;  Laterality: Left;  . KYPHOPLASTY  2012 ?   T 12- L1  . LAMINECTOMY  2005 ?   lumbar- 4-5  . PROSTATE SURGERY    . ROTATOR CUFF REPAIR Right   . TOTAL KNEE ARTHROPLASTY Right    Social History   Occupational History  . Retired [employer]   Social History Main Topics  . Smoking status: Never Smoker  . Smokeless tobacco: Never Used  . Alcohol use 0.0 oz/week     Comment: occasional beer  . Drug use: No  . Sexual activity: Not on file     Comment: Widowed

## 2016-12-10 ENCOUNTER — Encounter (HOSPITAL_COMMUNITY): Payer: Self-pay | Admitting: *Deleted

## 2016-12-10 ENCOUNTER — Ambulatory Visit (INDEPENDENT_AMBULATORY_CARE_PROVIDER_SITE_OTHER): Payer: Medicare Other | Admitting: *Deleted

## 2016-12-10 ENCOUNTER — Telehealth (INDEPENDENT_AMBULATORY_CARE_PROVIDER_SITE_OTHER): Payer: Self-pay

## 2016-12-10 DIAGNOSIS — I482 Chronic atrial fibrillation, unspecified: Secondary | ICD-10-CM

## 2016-12-10 DIAGNOSIS — Z7901 Long term (current) use of anticoagulants: Secondary | ICD-10-CM

## 2016-12-10 DIAGNOSIS — Z5181 Encounter for therapeutic drug level monitoring: Secondary | ICD-10-CM

## 2016-12-10 LAB — POCT INR: INR: 2.2

## 2016-12-10 NOTE — Telephone Encounter (Signed)
Nurse Ivin Booty called stating that patient had his INR checked this morning and it was 2.2 She states he is STILL taking his coumadin because noone told him any different Patient is scheduled for revision surgery tomorrow. She would like a call back  Is patient still ok to have surgery?

## 2016-12-10 NOTE — Telephone Encounter (Signed)
Called coumadin clinic to advise. To call with questions.

## 2016-12-10 NOTE — Progress Notes (Addendum)
I spoke to patients daughter York Pellant.  [name] states that patient does not have chest pain or HTN, he does have shortness of  Breath at times and uses oxygen prn. Dr Tamala Julian is his cardiologist. [name] reported that patient has taken Prednisone 20 mg on an empty stomach without any issues.  Patient will take Prednisone the morning of surgery. Patient is on Coumadin - Dr Sharol Given does not want patient to stop, INR - 2.2 today.

## 2016-12-10 NOTE — Telephone Encounter (Signed)
Okay to continue Coumadin.

## 2016-12-10 NOTE — Anesthesia Preprocedure Evaluation (Addendum)
Anesthesia Evaluation  Patient identified by MRN, date of birth, ID band Patient awake    Reviewed: Allergy & Precautions, NPO status , Patient's Chart, lab work & pertinent test results, reviewed documented beta blocker date and time   History of Anesthesia Complications Negative for: history of anesthetic complications  Airway Mallampati: III  TM Distance: >3 FB   Mouth opening: Limited Mouth Opening Comment: Ankylosing spondylosis Dental  (+) Dental Advisory Given, Missing   Pulmonary neg pulmonary ROS, shortness of breath, pneumonia, resolved,    breath sounds clear to auscultation       Cardiovascular hypertension, Pt. on home beta blockers and Pt. on medications (-) angina+ Peripheral Vascular Disease and +CHF  + dysrhythmias Atrial Fibrillation  Rhythm:Irregular Rate:Normal  '13 ECHO: EF 50%, mild AI, mod MR   Neuro/Psych myasthenia gravis-controlled with prednisone    GI/Hepatic Neg liver ROS, GERD  Medicated and Controlled,  Endo/Other  negative endocrine ROS  Renal/GU negative Renal ROS     Musculoskeletal  (+) Arthritis , Osteoarthritis and steroids,  Gangrene left foot   Abdominal   Peds  Hematology Coumadin   Anesthesia Other Findings   Reproductive/Obstetrics                             Anesthesia Physical  Anesthesia Plan  ASA: III  Anesthesia Plan: MAC   Post-op Pain Management:  Regional for Post-op pain   Induction: Intravenous  Airway Management Planned: Natural Airway and Nasal Cannula  Additional Equipment:   Intra-op Plan:   Post-operative Plan:   Informed Consent: I have reviewed the patients History and Physical, chart, labs and discussed the procedure including the risks, benefits and alternatives for the proposed anesthesia with the patient or authorized representative who has indicated his/her understanding and acceptance.   Dental advisory  given  Plan Discussed with: CRNA, Surgeon and Anesthesiologist  Anesthesia Plan Comments: (Plan routine monitors, ankle block)      Anesthesia Quick Evaluation

## 2016-12-10 NOTE — Telephone Encounter (Signed)
Nurse Ivin Booty called again wanting to know if patient is okay for surgery on tomorrow.  CB# is [phone].

## 2016-12-10 NOTE — Progress Notes (Signed)
Anesthesia Chart Review:  Pt is a same day work up  Pt is an 83 year old male scheduled for revision of left metatarsal amputation, apply wound VAC on 12/11/2016 with Meridee Score, M.D.  - PCP is Josetta Huddle, MD - Cardiologist is Daneen Schick, MD, last office visit 10/25/16 with Angelena Form, [place] - Neurologist is Margette Fast, MD  PMH includes:  Atrial fibrillation, CHF, myasthenia gravis, PAD, prostate cancer (1982), GERD. Never smoker. S/p multiple L toe/foot amputations 03/20/16-09/11/16.   - Hospitalized 3/2-10/02/16 for sepsis, complicated by acute CHF  Medications include: Lasix, Atrovent, metoprolol, Protonix, potassium, prednisone, Coumadin. Patient to remain on Coumadin perioperatively.  Labs will be obtained DOS.   CXR 09/29/16: Aortic atherosclerosis. Stable bibasilar opacities as described above.  EKG 10/16/16: Atrial fibrillation with premature aberrantly conducted complexes. LAD. Incomplete RBBB. Nonspecific ST and T-wave abnormality  Echo 09/24/16:  - Left ventricle: The cavity size was severely dilated. Wall thickness was normal. Systolic function was moderately reduced. The estimated ejection fraction was in the range of 35% to 40%. Diffuse hypokinesis. The study is not technically sufficient to allow evaluation of LV diastolic function. - Aortic valve: Sclerosis without stenosis. There was moderate regurgitation. - Mitral valve: Mildly thickened leaflets . There was severe regurgitation directed posteriorly. - Left atrium: Massively dilated (115 ml/m2). - Right ventricle: The cavity size was mildly dilated. Reduced RV systolic function. Lateral annulus peak S velocity: 7.62 cm/s. - Right atrium: Massively dilated (90 ml/m2). - Tricuspid valve: There was moderate regurgitation. - Pulmonary arteries: PA peak pressure: 52 mm Hg (S). - Inferior vena cava: The vessel was dilated. The respirophasic diameter changes were blunted (< 50%), consistent with elevated central venous  pressure. - Pericardium, extracardiac: A trivial pericardial effusion was identified posterior to the heart. There was a left pleural effusion.  By notes, pt had cardiac cath ~6 years ago in [place], [place] that showed normal coronaries. Cardiology notes also indicate pt is not a candidate for intervention on mitral valve and that MR is stable since 2015.   If labs acceptable DOS, I anticipate pt can proceed as scheduled.   Willeen Cass, FNP-BC [facility]/Anesthesiology Phone: [phone] 12/10/2016 1:23 PM'

## 2016-12-11 ENCOUNTER — Ambulatory Visit (HOSPITAL_COMMUNITY): Payer: Medicare Other | Admitting: Emergency Medicine

## 2016-12-11 ENCOUNTER — Encounter (HOSPITAL_COMMUNITY)
Admission: RE | Disposition: A | Discharge status: Home / Self Care | Payer: Self-pay | Source: Ambulatory Visit | Attending: Orthopedic Surgery

## 2016-12-11 ENCOUNTER — Ambulatory Visit (HOSPITAL_BASED_OUTPATIENT_CLINIC_OR_DEPARTMENT_OTHER)
Admission: RE | Admit: 2016-12-11 | Discharge: 2016-12-11 | Disposition: A | Discharge status: Home / Self Care | Payer: Medicare Other | Source: Ambulatory Visit | Attending: Orthopedic Surgery | Admitting: Orthopedic Surgery

## 2016-12-11 DIAGNOSIS — I4510 Unspecified right bundle-branch block: Secondary | ICD-10-CM

## 2016-12-11 DIAGNOSIS — Y835 Amputation of limb(s) as the cause of abnormal reaction of the patient, or of later complication, without mention of misadventure at the time of the procedure: Secondary | ICD-10-CM

## 2016-12-11 DIAGNOSIS — G7000 Myasthenia gravis without (acute) exacerbation: Secondary | ICD-10-CM | POA: Insufficient documentation

## 2016-12-11 DIAGNOSIS — I739 Peripheral vascular disease, unspecified: Secondary | ICD-10-CM | POA: Insufficient documentation

## 2016-12-11 DIAGNOSIS — I4891 Unspecified atrial fibrillation: Secondary | ICD-10-CM

## 2016-12-11 DIAGNOSIS — Z7951 Long term (current) use of inhaled steroids: Secondary | ICD-10-CM | POA: Insufficient documentation

## 2016-12-11 DIAGNOSIS — Z79899 Other long term (current) drug therapy: Secondary | ICD-10-CM

## 2016-12-11 DIAGNOSIS — Z7952 Long term (current) use of systemic steroids: Secondary | ICD-10-CM | POA: Insufficient documentation

## 2016-12-11 DIAGNOSIS — I110 Hypertensive heart disease with heart failure: Secondary | ICD-10-CM

## 2016-12-11 DIAGNOSIS — T8781 Dehiscence of amputation stump: Secondary | ICD-10-CM

## 2016-12-11 DIAGNOSIS — K219 Gastro-esophageal reflux disease without esophagitis: Secondary | ICD-10-CM | POA: Insufficient documentation

## 2016-12-11 DIAGNOSIS — Z96651 Presence of right artificial knee joint: Secondary | ICD-10-CM | POA: Insufficient documentation

## 2016-12-11 DIAGNOSIS — I083 Combined rheumatic disorders of mitral, aortic and tricuspid valves: Secondary | ICD-10-CM | POA: Insufficient documentation

## 2016-12-11 DIAGNOSIS — I509 Heart failure, unspecified: Secondary | ICD-10-CM

## 2016-12-11 DIAGNOSIS — I700 Atherosclerosis of aorta: Secondary | ICD-10-CM

## 2016-12-11 DIAGNOSIS — T8131XS Disruption of external operation (surgical) wound, not elsewhere classified, sequela: Secondary | ICD-10-CM

## 2016-12-11 DIAGNOSIS — Z7901 Long term (current) use of anticoagulants: Secondary | ICD-10-CM | POA: Insufficient documentation

## 2016-12-11 DIAGNOSIS — Z89432 Acquired absence of left foot: Secondary | ICD-10-CM | POA: Insufficient documentation

## 2016-12-11 HISTORY — DX: Dyspnea, unspecified: R06.00

## 2016-12-11 HISTORY — PX: STUMP REVISION: SHX6102

## 2016-12-11 LAB — CBC
HCT: 37.8 % — ABNORMAL LOW (ref 39.0–52.0)
Hemoglobin: 11.9 g/dL — ABNORMAL LOW (ref 13.0–17.0)
MCH: 31 pg (ref 26.0–34.0)
MCHC: 31.5 g/dL (ref 30.0–36.0)
MCV: 98.4 fL (ref 78.0–100.0)
PLATELETS: 198 10*3/uL (ref 150–400)
RBC: 3.84 MIL/uL — AB (ref 4.22–5.81)
RDW: 15.2 % (ref 11.5–15.5)
WBC: 10.5 10*3/uL (ref 4.0–10.5)

## 2016-12-11 LAB — BASIC METABOLIC PANEL
ANION GAP: 9 (ref 5–15)
BUN: 23 mg/dL — ABNORMAL HIGH (ref 6–20)
CHLORIDE: 105 mmol/L (ref 101–111)
CO2: 26 mmol/L (ref 22–32)
Calcium: 8.6 mg/dL — ABNORMAL LOW (ref 8.9–10.3)
Creatinine, Ser: 1.02 mg/dL (ref 0.61–1.24)
GFR calc non Af Amer: >60 mL/min (ref >60)
Glucose, Bld: 103 mg/dL — ABNORMAL HIGH (ref 65–99)
Potassium: 3.3 mmol/L — ABNORMAL LOW (ref 3.5–5.1)
SODIUM: 140 mmol/L (ref 135–145)

## 2016-12-11 LAB — PROTIME-INR
INR: 2.41
Prothrombin Time: 26.7 seconds — ABNORMAL HIGH (ref 11.4–15.2)

## 2016-12-11 SURGERY — REVISION, AMPUTATION SITE
Anesthesia: Regional | Laterality: Left

## 2016-12-11 MED ORDER — MEPERIDINE HCL 25 MG/ML IJ SOLN: 6.2500 mg | INTRAMUSCULAR | Status: DC | PRN

## 2016-12-11 MED ORDER — PROPOFOL 500 MG/50ML IV EMUL
INTRAVENOUS | Status: DC | PRN
  Administered 2016-12-11: 25 ug/kg/min via INTRAVENOUS

## 2016-12-11 MED ORDER — CEFAZOLIN SODIUM-DEXTROSE 2-4 GM/100ML-% IV SOLN
2.0000 g | INTRAVENOUS | Status: AC
  Administered 2016-12-11: 2 g via INTRAVENOUS
  Filled 2016-12-11: qty 100

## 2016-12-11 MED ORDER — MIDAZOLAM HCL 2 MG/2ML IJ SOLN
INTRAMUSCULAR | Status: AC
  Administered 2016-12-11: 1 mg
  Filled 2016-12-11: qty 2

## 2016-12-11 MED ORDER — PROPOFOL 1000 MG/100ML IV EMUL
INTRAVENOUS | Status: AC
  Filled 2016-12-11: qty 100

## 2016-12-11 MED ORDER — ONDANSETRON HCL 4 MG/2ML IJ SOLN: 4.0000 mg | Freq: Once | INTRAMUSCULAR | Status: DC | PRN

## 2016-12-11 MED ORDER — FENTANYL CITRATE (PF) 250 MCG/5ML IJ SOLN
INTRAMUSCULAR | Status: AC
  Filled 2016-12-11: qty 5

## 2016-12-11 MED ORDER — PROPOFOL 500 MG/50ML IV EMUL: INTRAVENOUS | Status: DC | PRN

## 2016-12-11 MED ORDER — BUPIVACAINE HCL (PF) 0.5 % IJ SOLN
INTRAMUSCULAR | Status: DC | PRN
  Administered 2016-12-11: 30 mL via PERINEURAL

## 2016-12-11 MED ORDER — FENTANYL CITRATE (PF) 250 MCG/5ML IJ SOLN
INTRAMUSCULAR | Status: DC | PRN
  Administered 2016-12-11: 50 ug via INTRAVENOUS

## 2016-12-11 MED ORDER — FENTANYL CITRATE (PF) 100 MCG/2ML IJ SOLN
INTRAMUSCULAR | Status: AC
Start: 2016-12-11 — End: 2016-12-11
  Administered 2016-12-11: 50 ug
  Filled 2016-12-11: qty 2

## 2016-12-11 MED ORDER — FENTANYL CITRATE (PF) 100 MCG/2ML IJ SOLN: 25.0000 ug | INTRAMUSCULAR | Status: DC | PRN

## 2016-12-11 MED ORDER — LIDOCAINE HCL (CARDIAC) 20 MG/ML IV SOLN
INTRAVENOUS | Status: DC | PRN
  Administered 2016-12-11: 40 mg via INTRAVENOUS

## 2016-12-11 MED ORDER — LACTATED RINGERS IV SOLN
INTRAVENOUS | Status: DC
Start: 2016-12-11 — End: 2016-12-11
  Administered 2016-12-11 (×2): via INTRAVENOUS

## 2016-12-11 MED ORDER — CHLORHEXIDINE GLUCONATE 4 % EX LIQD: 60.0000 mL | Freq: Once | CUTANEOUS | Status: DC

## 2016-12-11 MED ORDER — PROPOFOL 10 MG/ML IV BOLUS
INTRAVENOUS | Status: AC
  Filled 2016-12-11: qty 20

## 2016-12-11 MED ORDER — 0.9 % SODIUM CHLORIDE (POUR BTL) OPTIME
TOPICAL | Status: DC | PRN
  Administered 2016-12-11: 1000 mL

## 2016-12-11 SURGICAL SUPPLY — 30 items
BLADE SAW RECIP 87.9 MT (BLADE) ×3 IMPLANT
BLADE SURG 21 STRL SS (BLADE) ×3 IMPLANT
COVER SURGICAL LIGHT HANDLE (MISCELLANEOUS) ×3 IMPLANT
DRAPE EXTREMITY T 121X128X90 (DRAPE) ×3 IMPLANT
DRAPE HALF SHEET 40X57 (DRAPES) ×3 IMPLANT
DRAPE INCISE IOBAN 66X45 STRL (DRAPES) ×3 IMPLANT
DRAPE U-SHAPE 47X51 STRL (DRAPES) ×6 IMPLANT
DRSG VAC ATS MED SENSATRAC (GAUZE/BANDAGES/DRESSINGS) ×3 IMPLANT
DRSG VAC ATS SM SENSATRAC (GAUZE/BANDAGES/DRESSINGS) ×3 IMPLANT
DURAPREP 26ML APPLICATOR (WOUND CARE) ×3 IMPLANT
ELECT REM PT RETURN 9FT ADLT (ELECTROSURGICAL) ×3
ELECTRODE REM PT RTRN 9FT ADLT (ELECTROSURGICAL) ×1 IMPLANT
GLOVE BIOGEL PI IND STRL 9 (GLOVE) ×1 IMPLANT
GLOVE BIOGEL PI INDICATOR 9 (GLOVE) ×2
GLOVE SURG ORTHO 9.0 STRL STRW (GLOVE) ×3 IMPLANT
GOWN STRL REUS W/ TWL XL LVL3 (GOWN DISPOSABLE) ×2 IMPLANT
GOWN STRL REUS W/TWL XL LVL3 (GOWN DISPOSABLE) ×4
KIT BASIN OR (CUSTOM PROCEDURE TRAY) ×3 IMPLANT
KIT PREVENA INCISION MGT 13 (CANNISTER) ×3 IMPLANT
KIT ROOM TURNOVER OR (KITS) ×3 IMPLANT
MANIFOLD NEPTUNE II (INSTRUMENTS) ×3 IMPLANT
NS IRRIG 1000ML POUR BTL (IV SOLUTION) ×3 IMPLANT
PACK GENERAL/GYN (CUSTOM PROCEDURE TRAY) ×3 IMPLANT
PAD ARMBOARD 7.5X6 YLW CONV (MISCELLANEOUS) ×3 IMPLANT
PAD NEG PRESSURE SENSATRAC (MISCELLANEOUS) IMPLANT
STAPLER VISISTAT 35W (STAPLE) IMPLANT
SUT ETHILON 2 0 PSLX (SUTURE) ×3 IMPLANT
SUT SILK 2 0 (SUTURE)
SUT SILK 2-0 18XBRD TIE 12 (SUTURE) IMPLANT
TOWEL OR 17X26 10 PK STRL BLUE (TOWEL DISPOSABLE) ×3 IMPLANT

## 2016-12-11 NOTE — Progress Notes (Signed)
Wound vac with one beep, Dr Sharol Given at bedside to reinforce drsg, states its ok to send pt home with the one beep,

## 2016-12-11 NOTE — Transfer of Care (Signed)
Immediate Anesthesia Transfer of Care Note  Patient: Richard Bradley  Procedure(s) Performed: Procedure(s): Revision Left Transmetatarsal Amputation, Apply Wound VAC (Left)  Patient Location: PACU  Anesthesia Type:Regional  Level of Consciousness: awake, alert  and oriented  Airway & Oxygen Therapy: Patient Spontanous Breathing and Patient connected to nasal cannula oxygen  Post-op Assessment: Report given to RN and Post -op Vital signs reviewed and stable  Post vital signs: Reviewed and stable  Last Vitals:  Vitals:   12/11/16 0733  BP: 125/87  Pulse: 95  Resp: 16  Temp: 36.4 C    Last Pain:  Vitals:   12/11/16 0733  TempSrc: Oral      Patients Stated Pain Goal: 3 (81/01/75 1025)  Complications: No apparent anesthesia complications

## 2016-12-11 NOTE — Progress Notes (Signed)
Orthopedic Tech Progress Note Patient Details:  Richard Bradley 1926/07/20 [id number]  Ortho Devices Type of Ortho Device: Postop shoe/boot Ortho Device/Splint Location: lle Ortho Device/Splint Interventions: Application   Hildred Priest 12/11/2016, 2:08 PM

## 2016-12-11 NOTE — Anesthesia Procedure Notes (Signed)
Procedure Name: MAC Performed by: Teressa Lower Pre-anesthesia Checklist: Patient identified, Emergency Drugs available, Suction available and Patient being monitored Patient Re-evaluated:Patient Re-evaluated prior to inductionOxygen Delivery Method: Simple face mask

## 2016-12-11 NOTE — Op Note (Signed)
     Date of Surgery: 12/11/2016  INDICATIONS: Mr. Dovidio is a 83 y.o.-year-old male who has severe peripheral vascular disease. He is status post dehiscence of a transmetatarsal amputation. Patient will not consider a more proximal amputation and presents at this time for revision of the transmetatarsal amputation.Marland Kitchen  PREOPERATIVE DIAGNOSIS: Dehiscence left transmetatarsal amputation  POSTOPERATIVE DIAGNOSIS: Same.  PROCEDURE: Transmetatarsal amputation Application of Prevena wound VAC  SURGEON: Sharol Given, M.D.  ANESTHESIA:  general  IV FLUIDS AND URINE: See anesthesia.  ESTIMATED BLOOD LOSS: Minimal mL.  COMPLICATIONS: None.  DESCRIPTION OF PROCEDURE: The patient was brought to the operating room and underwent a general anesthetic. After adequate levels of anesthesia were obtained patient's lower extremity was prepped using DuraPrep draped into a sterile field. A timeout was called.  A fishmouth incision was made just proximal to the ulcerative nonviable tissue. This was carried sharply down to bone. A oscillating saw was used to perform a transmetatarsal amputation with a gentle cascade of the metatarsals and beveled plantarly. Electrocautery was used for hemostasis. The wound was irrigated with normal saline. The incision was closed using 2-0 nylon. A Prevena wound VAC was applied. This had a good suction fit. Patient was taken to the PACU in stable condition.  Meridee Score, MD [place] 10:08 AM

## 2016-12-11 NOTE — Anesthesia Procedure Notes (Signed)
Anesthesia Regional Block: Popliteal block   Pre-Anesthetic Checklist: ,, timeout performed, Correct Patient, Correct Site, Correct Laterality, Correct Procedure, Correct Position, site marked, Risks and benefits discussed, pre-op evaluation,  At surgeon's request and post-op pain management  Laterality: Left  Prep: chloraprep       Needles:   Needle Type: Echogenic Needle     Needle Length: 9cm  Needle Gauge: 21     Additional Needles:   Procedures: ultrasound guided,,,,,,,,  Narrative:  Start time: 12/11/2016 9:04 AM End time: 12/11/2016 9:10 AM Injection made incrementally with aspirations every 5 mL. Anesthesiologist: Lyndle Herrlich

## 2016-12-11 NOTE — H&P (Signed)
Richard Bradley is an 83 y.o. male.   Chief Complaint: Dehiscence left transmetatarsal amputation with exposed bone. HPI: Patient is an 83 year old gentleman with congestive heart failure peripheral vascular disease status post foot salvage intervention for the left foot who presents with acute dehiscence of the left transmetatarsal amputation with exposure of metatarsals 34 and 5 with complete wound dehiscence.  Past Medical History:  Diagnosis Date  . Atrial fibrillation ([place])   . Cellulitis    left arm  . CHF (congestive heart failure) ([place])   . Dyspnea    oxygen prn  . Dysrhythmia    afib  . GERD (gastroesophageal reflux disease)    ocassional  . History of kidney stones    passed  . Myasthenia gravis   . Osteoarthritis   . Pneumonia    Hstory of  . 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 Prostate cancer ([place]) dx'd 1982   treated with surgery and radiation.  Basal cell cancer- many  . Sepsis ([place])   . Urine incontinence   . Varicose veins of left lower extremity     Past Surgical History:  Procedure Laterality Date  . AMPUTATION Left 03/20/2016   Procedure: Left Great Toe Amputation at Metatarsophalangeal Joint;  Surgeon: Newt Minion, MD;  Location: [facility];  Service: Orthopedics;  Laterality: Left;  . AMPUTATION Left 06/11/2016   Procedure: Left 2nd Toe Amputation;  Surgeon: Newt Minion, MD;  Location: [facility];  Service: Orthopedics;  Laterality: Left;  . AMPUTATION Left 07/12/2016   Procedure: LEFT 3rd TOE AMPUTATION;  Surgeon: Newt Minion, MD;  Location: [facility];  Service: Orthopedics;  Laterality: Left;  . AMPUTATION Left 09/11/2016   Procedure: Left Transmetatarsal Amputation;  Surgeon: Newt Minion, MD;  Location: [facility];  Service: Orthopedics;  Laterality: Left;  . appendectomy    . APPENDECTOMY    . COLONOSCOPY    . HERNIA REPAIR Bilateral    Inguinal  . I&D EXTREMITY Left 10/12/2014   Procedure: IRRIGATION AND DEBRIDEMENT INDEX FINGER;  Surgeon:  Iran Planas, MD;  Location: [facility];  Service: Orthopedics;  Laterality: Left;  . KYPHOPLASTY  2012 ?   T 12- L1  . LAMINECTOMY  2005 ?   lumbar- 4-5  . PROSTATE SURGERY    . ROTATOR CUFF REPAIR Right   . TOTAL KNEE ARTHROPLASTY Right     Family History  Problem Relation Age of Onset  . Heart disease Father   . Heart attack Father   . Hypertension Father   . Heart disease Brother   . Heart disease Paternal Grandfather   . Stroke Mother   . Parkinsonism Sister   . Heart disease Sister   . Dementia Sister    Social History:  reports that he has never smoked. He has never used smokeless tobacco. He reports that he drinks about 1.2 oz of alcohol per week . He reports that he does not use drugs.  Allergies:  Allergies  Allergen Reactions  . Tape Other (See Comments)    PATIENT'S SKIN IS VERY THIN AND WILL TEAR AND BRUISE VERY EASILY!! Paper take is ok    No prescriptions prior to admission.    Results for orders placed or performed in visit on 12/10/16 (from the past 48 hour(s))  POCT INR     Status: None   Collection Time: 12/10/16 11:34 AM  Result Value Ref Range   INR 2.2    No results found.  Review of Systems  All other systems reviewed and are negative.   There were no vitals taken for this visit. Physical Exam  On examination patient has a palpable dorsalis pedis pulse. The wound has dehisced there is a good granulation tissue base of the wound but there is exposed ischemic metatarsals 34 and 5. Assessment/Plan Assessment: Peripheral vascular disease with dehiscence left transmetatarsal amputation.  Plan recommend proceeding with a transtibial amputation. Patient and his caregiver do not feel the patient would have any function with a transtibial amputation he wants to continue with foot salvage interventions. We'll plan for revision of the transmetatarsal amputation. Discussed that patient is at increased risk of the wound not healing need for additional surgery.  Patient states he understands wishes to proceed at this time.   Newt Minion, MD 12/11/2016, 6:41 AM

## 2016-12-12 ENCOUNTER — Telehealth (INDEPENDENT_AMBULATORY_CARE_PROVIDER_SITE_OTHER): Payer: Self-pay | Admitting: Radiology

## 2016-12-12 ENCOUNTER — Emergency Department (HOSPITAL_COMMUNITY): Payer: Medicare Other

## 2016-12-12 ENCOUNTER — Inpatient Hospital Stay (HOSPITAL_COMMUNITY)
Admission: EM | Admit: 2016-12-12 | Discharge: 2016-12-16 | DRG: 474 | Disposition: A | Discharge status: Home / Self Care | Payer: Medicare Other | Attending: Internal Medicine | Admitting: Internal Medicine

## 2016-12-12 ENCOUNTER — Encounter (HOSPITAL_COMMUNITY): Payer: Self-pay | Admitting: Orthopedic Surgery

## 2016-12-12 DIAGNOSIS — Z8546 Personal history of malignant neoplasm of prostate: Secondary | ICD-10-CM

## 2016-12-12 DIAGNOSIS — L03115 Cellulitis of right lower limb: Secondary | ICD-10-CM | POA: Diagnosis not present

## 2016-12-12 DIAGNOSIS — Z820 Family history of epilepsy and other diseases of the nervous system: Secondary | ICD-10-CM

## 2016-12-12 DIAGNOSIS — I739 Peripheral vascular disease, unspecified: Secondary | ICD-10-CM | POA: Diagnosis present

## 2016-12-12 DIAGNOSIS — K219 Gastro-esophageal reflux disease without esophagitis: Secondary | ICD-10-CM | POA: Diagnosis present

## 2016-12-12 DIAGNOSIS — A419 Sepsis, unspecified organism: Secondary | ICD-10-CM | POA: Diagnosis present

## 2016-12-12 DIAGNOSIS — R6520 Severe sepsis without septic shock: Secondary | ICD-10-CM | POA: Diagnosis not present

## 2016-12-12 DIAGNOSIS — T8781 Dehiscence of amputation stump: Secondary | ICD-10-CM | POA: Diagnosis present

## 2016-12-12 DIAGNOSIS — Z96651 Presence of right artificial knee joint: Secondary | ICD-10-CM | POA: Diagnosis present

## 2016-12-12 DIAGNOSIS — Z7952 Long term (current) use of systemic steroids: Secondary | ICD-10-CM

## 2016-12-12 DIAGNOSIS — Z7951 Long term (current) use of inhaled steroids: Secondary | ICD-10-CM

## 2016-12-12 DIAGNOSIS — E876 Hypokalemia: Secondary | ICD-10-CM | POA: Diagnosis present

## 2016-12-12 DIAGNOSIS — Z85828 Personal history of other malignant neoplasm of skin: Secondary | ICD-10-CM | POA: Diagnosis not present

## 2016-12-12 DIAGNOSIS — I5022 Chronic systolic (congestive) heart failure: Secondary | ICD-10-CM | POA: Diagnosis present

## 2016-12-12 DIAGNOSIS — A4152 Sepsis due to Pseudomonas: Secondary | ICD-10-CM | POA: Diagnosis not present

## 2016-12-12 DIAGNOSIS — Z9981 Dependence on supplemental oxygen: Secondary | ICD-10-CM

## 2016-12-12 DIAGNOSIS — E861 Hypovolemia: Secondary | ICD-10-CM | POA: Diagnosis present

## 2016-12-12 DIAGNOSIS — M1990 Unspecified osteoarthritis, unspecified site: Secondary | ICD-10-CM | POA: Diagnosis present

## 2016-12-12 DIAGNOSIS — G9341 Metabolic encephalopathy: Secondary | ICD-10-CM | POA: Diagnosis present

## 2016-12-12 DIAGNOSIS — B965 Pseudomonas (aeruginosa) (mallei) (pseudomallei) as the cause of diseases classified elsewhere: Secondary | ICD-10-CM | POA: Diagnosis present

## 2016-12-12 DIAGNOSIS — I4891 Unspecified atrial fibrillation: Secondary | ICD-10-CM | POA: Diagnosis not present

## 2016-12-12 DIAGNOSIS — Z823 Family history of stroke: Secondary | ICD-10-CM

## 2016-12-12 DIAGNOSIS — I482 Chronic atrial fibrillation, unspecified: Secondary | ICD-10-CM | POA: Diagnosis present

## 2016-12-12 DIAGNOSIS — N390 Urinary tract infection, site not specified: Secondary | ICD-10-CM | POA: Diagnosis present

## 2016-12-12 DIAGNOSIS — R4182 Altered mental status, unspecified: Secondary | ICD-10-CM | POA: Diagnosis not present

## 2016-12-12 DIAGNOSIS — G7000 Myasthenia gravis without (acute) exacerbation: Secondary | ICD-10-CM | POA: Diagnosis present

## 2016-12-12 DIAGNOSIS — Z66 Do not resuscitate: Secondary | ICD-10-CM | POA: Diagnosis present

## 2016-12-12 DIAGNOSIS — Z91048 Other nonmedicinal substance allergy status: Secondary | ICD-10-CM

## 2016-12-12 DIAGNOSIS — Z7901 Long term (current) use of anticoagulants: Secondary | ICD-10-CM | POA: Diagnosis not present

## 2016-12-12 DIAGNOSIS — T8131XA Disruption of external operation (surgical) wound, not elsewhere classified, initial encounter: Secondary | ICD-10-CM | POA: Diagnosis present

## 2016-12-12 DIAGNOSIS — Z8249 Family history of ischemic heart disease and other diseases of the circulatory system: Secondary | ICD-10-CM

## 2016-12-12 DIAGNOSIS — Z89439 Acquired absence of unspecified foot: Secondary | ICD-10-CM

## 2016-12-12 DIAGNOSIS — I110 Hypertensive heart disease with heart failure: Secondary | ICD-10-CM | POA: Diagnosis present

## 2016-12-12 DIAGNOSIS — I8392 Asymptomatic varicose veins of left lower extremity: Secondary | ICD-10-CM | POA: Diagnosis present

## 2016-12-12 DIAGNOSIS — I10 Essential (primary) hypertension: Secondary | ICD-10-CM | POA: Diagnosis not present

## 2016-12-12 DIAGNOSIS — T8131XD Disruption of external operation (surgical) wound, not elsewhere classified, subsequent encounter: Secondary | ICD-10-CM | POA: Diagnosis not present

## 2016-12-12 LAB — URINALYSIS, ROUTINE W REFLEX MICROSCOPIC
BILIRUBIN URINE: NEGATIVE
Bacteria, UA: NONE SEEN
GLUCOSE, UA: NEGATIVE mg/dL
Ketones, ur: NEGATIVE mg/dL
Leukocytes, UA: NEGATIVE
NITRITE: NEGATIVE
PH: 6 (ref 5.0–8.0)
Protein, ur: 30 mg/dL — AB
SPECIFIC GRAVITY, URINE: 1.015 (ref 1.005–1.030)

## 2016-12-12 LAB — CBC WITH DIFFERENTIAL/PLATELET
BASOS ABS: 0 10*3/uL (ref 0.0–0.1)
BASOS PCT: 0 %
EOS PCT: 0 %
Eosinophils Absolute: 0 10*3/uL (ref 0.0–0.7)
HCT: 38.5 % — ABNORMAL LOW (ref 39.0–52.0)
Hemoglobin: 12.2 g/dL — ABNORMAL LOW (ref 13.0–17.0)
LYMPHS PCT: 2 %
Lymphs Abs: 0.3 10*3/uL — ABNORMAL LOW (ref 0.7–4.0)
MCH: 31 pg (ref 26.0–34.0)
MCHC: 31.7 g/dL (ref 30.0–36.0)
MCV: 98 fL (ref 78.0–100.0)
MONO ABS: 0.7 10*3/uL (ref 0.1–1.0)
Monocytes Relative: 5 %
Neutro Abs: 13.9 10*3/uL — ABNORMAL HIGH (ref 1.7–7.7)
Neutrophils Relative %: 93 %
PLATELETS: 162 10*3/uL (ref 150–400)
RBC: 3.93 MIL/uL — AB (ref 4.22–5.81)
RDW: 15.3 % (ref 11.5–15.5)
WBC: 14.9 10*3/uL — AB (ref 4.0–10.5)

## 2016-12-12 LAB — COMPREHENSIVE METABOLIC PANEL
ALBUMIN: 3.3 g/dL — AB (ref 3.5–5.0)
ALK PHOS: 57 U/L (ref 38–126)
ALT: 14 U/L — ABNORMAL LOW (ref 17–63)
ANION GAP: 8 (ref 5–15)
AST: 36 U/L (ref 15–41)
BILIRUBIN TOTAL: 1.7 mg/dL — AB (ref 0.3–1.2)
BUN: 16 mg/dL (ref 6–20)
CALCIUM: 8.3 mg/dL — AB (ref 8.9–10.3)
CO2: 26 mmol/L (ref 22–32)
Chloride: 103 mmol/L (ref 101–111)
Creatinine, Ser: 1 mg/dL (ref 0.61–1.24)
GFR calc Af Amer: >60 mL/min (ref >60)
GLUCOSE: 131 mg/dL — AB (ref 65–99)
Potassium: 3.5 mmol/L (ref 3.5–5.1)
Sodium: 137 mmol/L (ref 135–145)
TOTAL PROTEIN: 5.5 g/dL — AB (ref 6.5–8.1)

## 2016-12-12 LAB — PROTIME-INR
INR: 2.73
Prothrombin Time: 29.5 seconds — ABNORMAL HIGH (ref 11.4–15.2)

## 2016-12-12 LAB — I-STAT CG4 LACTIC ACID, ED
Lactic Acid, Venous: 1.45 mmol/L (ref 0.5–1.9)
Lactic Acid, Venous: 1.3 mmol/L (ref 0.5–1.9)

## 2016-12-12 LAB — MRSA PCR SCREENING: MRSA by PCR: NEGATIVE

## 2016-12-12 MED ORDER — DEXTROSE 5 % IV SOLN
1.0000 g | Freq: Once | INTRAVENOUS | Status: AC
  Administered 2016-12-12: 1 g via INTRAVENOUS
  Filled 2016-12-12: qty 10

## 2016-12-12 MED ORDER — ESCITALOPRAM OXALATE 10 MG PO TABS
5.0000 mg | ORAL_TABLET | Freq: Every day | ORAL | Status: DC
  Administered 2016-12-12 – 2016-12-15 (×4): 5 mg via ORAL
  Filled 2016-12-12 (×4): qty 1

## 2016-12-12 MED ORDER — ORAL CARE MOUTH RINSE
15.0000 mL | Freq: Two times a day (BID) | OROMUCOSAL | Status: DC
  Administered 2016-12-13 – 2016-12-15 (×4): 15 mL via OROMUCOSAL

## 2016-12-12 MED ORDER — ONDANSETRON HCL 4 MG PO TABS: 4.0000 mg | ORAL_TABLET | Freq: Three times a day (TID) | ORAL | 0 refills | Status: DC | PRN

## 2016-12-12 MED ORDER — PANTOPRAZOLE SODIUM 40 MG PO TBEC
40.0000 mg | DELAYED_RELEASE_TABLET | Freq: Every day | ORAL | Status: DC
  Administered 2016-12-12 – 2016-12-16 (×5): 40 mg via ORAL
  Filled 2016-12-12 (×5): qty 1

## 2016-12-12 MED ORDER — SODIUM CHLORIDE 0.9 % IV BOLUS (SEPSIS)
1000.0000 mL | Freq: Once | INTRAVENOUS | Status: AC
  Administered 2016-12-12: 1000 mL via INTRAVENOUS

## 2016-12-12 MED ORDER — PREDNISONE 20 MG PO TABS
30.0000 mg | ORAL_TABLET | Freq: Every day | ORAL | Status: DC
  Administered 2016-12-13 – 2016-12-16 (×4): 30 mg via ORAL
  Filled 2016-12-12 (×4): qty 1

## 2016-12-12 MED ORDER — ONDANSETRON HCL 4 MG/2ML IJ SOLN: 4.0000 mg | Freq: Four times a day (QID) | INTRAMUSCULAR | Status: DC | PRN

## 2016-12-12 MED ORDER — VANCOMYCIN HCL IN DEXTROSE 750-5 MG/150ML-% IV SOLN
750.0000 mg | Freq: Two times a day (BID) | INTRAVENOUS | Status: DC
  Administered 2016-12-13 – 2016-12-15 (×5): 750 mg via INTRAVENOUS
  Filled 2016-12-12 (×6): qty 150

## 2016-12-12 MED ORDER — PIPERACILLIN-TAZOBACTAM 3.375 G IVPB
3.3750 g | Freq: Three times a day (TID) | INTRAVENOUS | Status: DC
  Administered 2016-12-13 – 2016-12-16 (×10): 3.375 g via INTRAVENOUS
  Filled 2016-12-12 (×12): qty 50

## 2016-12-12 MED ORDER — SODIUM CHLORIDE 0.9 % IV SOLN
1000.0000 mL | INTRAVENOUS | Status: DC
  Administered 2016-12-12: 1000 mL via INTRAVENOUS

## 2016-12-12 MED ORDER — ONDANSETRON HCL 4 MG PO TABS: 4.0000 mg | ORAL_TABLET | Freq: Four times a day (QID) | ORAL | Status: DC | PRN

## 2016-12-12 MED ORDER — HYDROCODONE-ACETAMINOPHEN 10-325 MG PO TABS
1.0000 | ORAL_TABLET | Freq: Four times a day (QID) | ORAL | Status: DC | PRN
  Administered 2016-12-15 (×2): 1 via ORAL
  Filled 2016-12-12 (×2): qty 1

## 2016-12-12 MED ORDER — METOPROLOL TARTRATE 12.5 MG HALF TABLET
12.5000 mg | ORAL_TABLET | Freq: Two times a day (BID) | ORAL | Status: DC
  Administered 2016-12-12 – 2016-12-13 (×3): 12.5 mg via ORAL
  Filled 2016-12-12 (×3): qty 1

## 2016-12-12 MED ORDER — PIPERACILLIN-TAZOBACTAM 3.375 G IVPB 30 MIN
3.3750 g | Freq: Once | INTRAVENOUS | Status: AC
  Administered 2016-12-12: 3.375 g via INTRAVENOUS
  Filled 2016-12-12: qty 50

## 2016-12-12 MED ORDER — SODIUM CHLORIDE 0.9 % IV SOLN
INTRAVENOUS | Status: AC
  Administered 2016-12-12: 1000 mL via INTRAVENOUS

## 2016-12-12 MED ORDER — VANCOMYCIN HCL 10 G IV SOLR
1500.0000 mg | Freq: Once | INTRAVENOUS | Status: AC
  Administered 2016-12-12: 1500 mg via INTRAVENOUS
  Filled 2016-12-12: qty 1500

## 2016-12-12 NOTE — ED Triage Notes (Signed)
Per EMS pt was at home and family called bc pt was more lethargic today. He had a left foot amputation on 12/11/16. Per family pt was confused with nausea and vomiting. Per family this started today at 1pm.

## 2016-12-12 NOTE — Progress Notes (Signed)
Pharmacy Antibiotic Note  Richard Bradley is a 83 y.o. male with recent L-TMA revision on 5/16 who re-presented to the [facility] on 12/12/2016 with AMS concerning for sepsis.   Pharmacy has been consulted for Vancomycin + Zosyn dosing. SCr 1, CrCl~50-60 ml/min.   Plan: 1. Vancomycin 1500 mg IV x 1 (as already ordered by the EDP) followed by 750 mg IV every 12 hours 2. Zosyn 3.375g IV every 8 hours (infused over 4 hours) 3. Will continue to follow renal function, culture results, LOT, and antibiotic de-escalation plans      Temp (24hrs), Avg:99.8 F (37.7 C), Min:98.6 F (37 C), Max:101 F (38.3 C)   Recent Labs Lab 12/11/16 0746 12/12/16 1556 12/12/16 1702 12/12/16 1954  WBC 10.5 14.9*  --   --   CREATININE 1.02 1.00  --   --   LATICACIDVEN  --   --  1.45 1.30    Estimated Creatinine Clearance: 55 mL/min (by C-G formula based on SCr of 1 mg/dL).    Allergies  Allergen Reactions  . Tape Other (See Comments)    PATIENT'S SKIN IS VERY THIN AND WILL TEAR AND BRUISE VERY EASILY!! Paper take is ok    Antimicrobials this admission: Vanc 5/17 >> Zosyn 5/17 >>  Dose adjustments this admission: n/a  Microbiology results: 5/17 BCx >>  Thank you for allowing pharmacy to be a part of this patient's care.  Alycia Rossetti, PharmD, BCPS Clinical Pharmacist Pager: [phone] 12/12/2016 8:15 PM

## 2016-12-12 NOTE — Telephone Encounter (Signed)
Richard Bradley patients calling this morning. Patient was doing fine until 330am. He has had nausea, dry heaving. She has checked his temperature 4xs. He has no fever, no shortness of breath, no new medications. He is unable to eat or drink. We will send in zofran. If he has no improvement she will take him to the hospital. Patient has history of sepsis with dry heaving. If no improvement with zofran, she is agreeable to take him to the hospital.

## 2016-12-12 NOTE — ED Provider Notes (Signed)
[name] [facility] DEPT Provider Note   CSN: [id number] Arrival date & time: 12/12/16  1602     History   Chief Complaint Chief Complaint  Patient presents with  . Altered Mental Status    HPI Richard Bradley is a 83 y.o. male.  HPI  83 year old male, history significant for atrial fibrillation on Coumadin, congestive heart failure with a poor ejection fraction, history of transmetatarsal amputation of the left foot which has recently dehisced and for which she underwent a revision surgery yesterday. The patient has known peripheral vascular disease which led up to that problem. The patient has been altered and febrile per family members although we do not have any objective findings or any family members to discuss this care with. A level V caveat applies secondary to the patient's inability to communicate. The patient is mumbling but does not make out any discernible words.  Past Medical History:  Diagnosis Date  . Atrial fibrillation ([place])   . Cellulitis    left arm  . CHF (congestive heart failure) ([place])   . Dyspnea    oxygen prn  . Dysrhythmia    afib  . GERD (gastroesophageal reflux disease)    ocassional  . History of kidney stones    passed  . Myasthenia gravis   . Osteoarthritis   . Pneumonia    Hstory of  . 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 Prostate cancer ([place]) dx'd 1982   treated with surgery and radiation.  Basal cell cancer- many  . Sepsis ([place])   . Urine incontinence   . Varicose veins of left lower extremity     Patient Active Problem List   Diagnosis Date Noted  . Sepsis ([place]) 12/12/2016  . Wound dehiscence, surgical 10/03/2016  . Acute respiratory distress   . Goals of care, counseling/discussion   . Palliative care by specialist   . Sepsis due to Gram negative bacteria ([place]) 09/21/2016  . Gram-negative bacteremia 09/21/2016  . Leukocytosis 09/21/2016  . Hypokalemia 09/21/2016  . Benign essential HTN 09/21/2016  . Amputated  toe, left ([place]) 06/18/2016  . Anticoagulated on Coumadin 12/20/2013  . Myasthenia gravis ([place]) 06/27/2011  . Chronic atrial fibrillation [facility])     Past Surgical History:  Procedure Laterality Date  . AMPUTATION Left 03/20/2016   Procedure: Left Great Toe Amputation at Metatarsophalangeal Joint;  Surgeon: Newt Minion, MD;  Location: [place];  Service: Orthopedics;  Laterality: Left;  . AMPUTATION Left 06/11/2016   Procedure: Left 2nd Toe Amputation;  Surgeon: Newt Minion, MD;  Location: [place];  Service: Orthopedics;  Laterality: Left;  . AMPUTATION Left 07/12/2016   Procedure: LEFT 3rd TOE AMPUTATION;  Surgeon: Newt Minion, MD;  Location: [place];  Service: Orthopedics;  Laterality: Left;  . AMPUTATION Left 09/11/2016   Procedure: Left Transmetatarsal Amputation;  Surgeon: Newt Minion, MD;  Location: [place];  Service: Orthopedics;  Laterality: Left;  . appendectomy    . APPENDECTOMY    . COLONOSCOPY    . HERNIA REPAIR Bilateral    Inguinal  . I&D EXTREMITY Left 10/12/2014   Procedure: IRRIGATION AND DEBRIDEMENT INDEX FINGER;  Surgeon: Iran Planas, MD;  Location: [place];  Service: Orthopedics;  Laterality: Left;  . KYPHOPLASTY  2012 ?   T 12- L1  . LAMINECTOMY  2005 ?   lumbar- 4-5  . PROSTATE SURGERY    . ROTATOR CUFF REPAIR Right   . STUMP REVISION Left 12/11/2016   Procedure: Revision Left Transmetatarsal Amputation,  Apply Wound VAC;  Surgeon: Newt Minion, MD;  Location: [place];  Service: Orthopedics;  Laterality: Left;  . TOTAL KNEE ARTHROPLASTY Right        Home Medications    Prior to Admission medications   Medication Sig Start Date End Date Taking? Authorizing Provider  Cholecalciferol (VITAMIN D) 2000 units CAPS Take 2,000 Units by mouth daily.    [provider]  escitalopram (LEXAPRO) 5 MG tablet Take 5 mg by mouth at bedtime. 09/15/16   [provider]  FIBER PO Take 1 tablet by mouth 3 (three) times daily.     [provider]    fluticasone (FLONASE) 50 MCG/ACT nasal spray Place 1 spray into both nostrils 2 (two) times daily.     [provider]  furosemide (LASIX) 40 MG tablet Take 1 tablet (40 mg total) by mouth 2 (two) times daily. 10/02/16   Rai, Vernelle Emerald, MD  guaiFENesin (MUCINEX) 600 MG 12 hr tablet Take 600 mg by mouth 2 (two) times daily as needed for to loosen phlegm.    [provider]  HYDROcodone-acetaminophen (NORCO) 10-325 MG per tablet Take 1 tablet by mouth every 6 (six) hours as needed (for pain).     [provider]  ipratropium (ATROVENT) 0.03 % nasal spray Place 2 sprays into both nostrils 2 (two) times daily as needed (for congestion).     [provider]  metoprolol tartrate (LOPRESSOR) 25 MG tablet Take 1 tablet (25 mg total) by mouth 2 (two) times daily. 10/02/16   Rai, Vernelle Emerald, MD  ondansetron (ZOFRAN) 4 MG tablet Take 1 tablet (4 mg total) by mouth every 8 (eight) hours as needed for nausea or vomiting. 12/12/16   Suzan Slick, NP  pantoprazole (PROTONIX) 40 MG tablet Take 1 tablet (40 mg total) by mouth daily. 10/02/16   Rai, Vernelle Emerald, MD  polyethylene glycol (MIRALAX / GLYCOLAX) packet Take 17 g by mouth daily as needed for moderate constipation. For constipation 10/02/16   Rai, Ripudeep K, MD  potassium chloride SA (K-DUR,KLOR-CON) 20 MEQ tablet Take 1 tablet (20 mEq total) by mouth daily. Patient taking differently: Take 20 mEq by mouth daily with lunch.  10/02/16   Rai, Vernelle Emerald, MD  predniSONE (DELTASONE) 10 MG tablet Take 2 tablets (20 mg total) by mouth daily with breakfast. Take 40mg  for 1 more day, then continue 20mg  daily Patient taking differently: Take 20 mg by mouth daily with breakfast.  10/02/16   Rai, Ripudeep K, MD  vitamin B-12 (CYANOCOBALAMIN) 1000 MCG tablet Take 1,000 mcg by mouth daily with lunch.     [provider]  warfarin (COUMADIN) 5 MG tablet Take as directed by coumadin clinic Patient taking differently: Take 2.5mg s daily  except take 5mg s daily Wednesday and Sunday 07/11/16   Belva Crome, MD    Family History Family History  Problem Relation Age of Onset  . Heart disease Father   . Heart attack Father   . Hypertension Father   . Heart disease Brother   . Heart disease Paternal Grandfather   . Stroke Mother   . Parkinsonism Sister   . Heart disease Sister   . Dementia Sister     Social History Social History  Substance Use Topics  . Smoking status: Never Smoker  . Smokeless tobacco: Never Used  . Alcohol use 1.2 oz/week    2 Cans of beer per week     Comment: occasional beer  Allergies   Tape   Review of Systems Review of Systems  Unable to perform ROS: Mental status change     Physical Exam Updated Vital Signs BP 98/78   Pulse (!) 132   Temp (!) 101 F (38.3 C) (Rectal)   Resp 16   SpO2 94%   Physical Exam  Constitutional: He appears well-developed and well-nourished. No distress.  HENT:  Head: Normocephalic and atraumatic.  Mouth/Throat: Oropharynx is clear and moist. No oropharyngeal exudate.  Eyes: Conjunctivae and EOM are normal. Pupils are equal, round, and reactive to light. Right eye exhibits no discharge. Left eye exhibits no discharge. No scleral icterus.  Neck: Normal range of motion. Neck supple. No JVD present. No thyromegaly present.  Cardiovascular: Normal heart sounds and intact distal pulses.  Exam reveals no gallop and no friction rub.   No murmur heard. Atrial fibrillation, normal rate, irregular rate, normal pulses at the radial arteries, normal capillary refill of the bilateral hands and the right foot. No JVD  Pulmonary/Chest: Effort normal and breath sounds normal. No respiratory distress. He has no wheezes. He has no rales.  Abdominal: Soft. Bowel sounds are normal. He exhibits no distension and no mass. There is no tenderness.  Musculoskeletal: Normal range of motion. He exhibits no edema or tenderness.  Amputation performed on the left foot,  with vacuum / bag.   Lymphadenopathy:    He has no cervical adenopathy.  Neurological: He is alert. Coordination normal.  The patient is able to lift up both of his hands to command, he is able to lift up both of his legs to command, when he speaks it is mostly mumbling with the occasional recognizable words.  Skin: Skin is warm and dry. Rash noted. There is erythema.  RLE with redness and warmth from the foot to the knee  Psychiatric: He has a normal mood and affect. His behavior is normal.  Nursing note and vitals reviewed.    ED Treatments / Results  Labs (all labs ordered are listed, but only abnormal results are displayed) Labs Reviewed  COMPREHENSIVE METABOLIC PANEL - Abnormal; Notable for the following:       Result Value   Glucose, Bld 131 (*)    Calcium 8.3 (*)    Total Protein 5.5 (*)    Albumin 3.3 (*)    ALT 14 (*)    Total Bilirubin 1.7 (*)    All other components within normal limits  CBC WITH DIFFERENTIAL/PLATELET - Abnormal; Notable for the following:    WBC 14.9 (*)    RBC 3.93 (*)    Hemoglobin 12.2 (*)    HCT 38.5 (*)    Neutro Abs 13.9 (*)    Lymphs Abs 0.3 (*)    All other components within normal limits  URINALYSIS, ROUTINE W REFLEX MICROSCOPIC - Abnormal; Notable for the following:    APPearance HAZY (*)    Hgb urine dipstick LARGE (*)    Protein, ur 30 (*)    Squamous Epithelial / LPF 0-5 (*)    All other components within normal limits  CULTURE, BLOOD (ROUTINE X 2)  CULTURE, BLOOD (ROUTINE X 2)  I-STAT CG4 LACTIC ACID, ED  I-STAT CG4 LACTIC ACID, ED    EKG  EKG Interpretation  Date/Time:  Thursday Dec 12 2016 16:56:46 EDT Ventricular Rate:  94 PR Interval:    QRS Duration: 116 QT Interval:  379 QTC Calculation: 474 R Axis:   -45 Text Interpretation:  Atrial fibrillation Incomplete RBBB and  LAFB Nonspecific T abnormalities, lateral leads Since last tracing rate slower Confirmed by Noemi Chapel [phone]) on 12/12/2016 6:48:30 PM       Radiology Dg Chest Port 1 View  Result Date: 12/12/2016 CLINICAL DATA:  Lethargy and confusion EXAM: PORTABLE CHEST 1 VIEW COMPARISON:  September 29, 2016 FINDINGS: There is no edema or consolidation. There is extensive calcification in the hemithoraces consistent with calcified pleural plaques. This appearance is stable. There is stable cardiomegaly with pulmonary vascular within normal limits. There is aortic atherosclerosis. No evident adenopathy. Bones are osteoporotic. IMPRESSION: Stable cardiomegaly.  There is aortic atherosclerosis. Calcified pleural plaques in the upper hemithoraces remain stable. There is no edema or consolidation. Electronically Signed   By: Lowella Grip III M.D.   On: 12/12/2016 16:52    Procedures Procedures (including critical care time)  Medications Ordered in ED Medications  0.9 %  sodium chloride infusion (0 mLs Intravenous Stopped 12/12/16 1750)  cefTRIAXone (ROCEPHIN) 1 g in dextrose 5 % 50 mL IVPB (1 g Intravenous New Bag/Given 12/12/16 1902)  sodium chloride 0.9 % bolus 1,000 mL (not administered)    Initial Impression / Assessment and Plan / ED Course  I have reviewed the triage vital signs and the nursing notes.  Pertinent labs & imaging results that were available during my care of the patient were reviewed by me and considered in my medical decision making (see chart for details).     The patient could have a fever though he does not have one by oral route, we'll check rectal temperature, initiated a sepsis protocol though the patient does not appear to be unstable he definitely has some difficulty with speech. It is unclear what his baseline is in regards to this. Family is in route.   Family reports that the patient has had a rapid decline in his mental status, fever to 102 earlier today, increased redness over his right lower extremity which they think may be related to a skin infection. They gave him 25 mg of Phenergan by rectal route because of  nausea and vomiting that started at 3:00 AM. They report that his normal mental status is to be sitting up talking and carrying on a full conversation. He only recently retired last year  D/w Dr. Loleta Books at 7:05 - will admit Rocephin, fluids  Final Clinical Impressions(s) / ED Diagnoses   Final diagnoses:  Cellulitis of right lower extremity  Altered mental status, unspecified altered mental status type   New Prescriptions New Prescriptions   No medications on file     Noemi Chapel, MD 12/12/16 1906

## 2016-12-12 NOTE — Progress Notes (Signed)
[facility] for warfarin Indication: atrial fibrillation   Assessment: 77 yom with hx of afib on warfarin admitted with AMS. Pharmacy consulted to does warfarin while inpatient. INR yesterday was therapeutic at 2.41 - INR for today pending. CBC stable. No bleed documented. Noted patient started on antibiotics in the ED for sepsis.  PTA warfarin dose: 5mg  on Mon/Fri, 2.5mg  on AOD (last dose 5/16 PTA)  Goal of Therapy:  INR 2-3 Monitor platelets by anticoagulation protocol: Yes   Plan:  F/u tonight's INR prior to entering warfarin dose Daily INR Monitor CBC, s/sx bleeding  Elicia Lamp, PharmD, BCPS Clinical Pharmacist 12/12/2016 9:05 PM

## 2016-12-12 NOTE — Anesthesia Postprocedure Evaluation (Signed)
Anesthesia Post Note  Patient: Richard Bradley  Procedure(s) Performed: Procedure(s) (LRB): Revision Left Transmetatarsal Amputation, Apply Wound VAC (Left)  Patient location during evaluation: PACU Anesthesia Type: MAC Level of consciousness: awake and alert Pain management: pain level controlled Vital Signs Assessment: post-procedure vital signs reviewed and stable Respiratory status: spontaneous breathing, nonlabored ventilation, respiratory function stable and patient connected to nasal cannula oxygen Cardiovascular status: stable and blood pressure returned to baseline Anesthetic complications: no       Last Vitals:  Vitals:   12/11/16 1102 12/11/16 1103  BP:  103/71  Pulse:    Resp: (!) 28 (!) 21  Temp:      Last Pain:  Vitals:   12/11/16 0733  TempSrc: Oral                 [name] EDWARD

## 2016-12-12 NOTE — Progress Notes (Signed)
Patient arrived to unit 4 N bed 03 from emergency department. Assisted patient to bed by nursing staff. Home wound vac in place to left foot. Patient has orders for wound nurse to assess in morning.Patient denies pain or shortness of breath at present time.No acute distress noted.Oriented patient and daughter Richard Bradley to  Nursing unit and call bell.Patient fall risk score 14 .Yellow socks and bracelet applied bed alarm set. Educated on fall prevention safety plan with patient and family.Daughter Richard Bradley signed form and form left with family.Will continue to monitor patient.

## 2016-12-12 NOTE — H&P (Signed)
History and Physical  Patient Name: Richard Bradley     XTG:[id number]    DOB: 1926/01/11    DOA: 12/12/2016 PCP: Josetta Huddle, MD   Patient coming from: Home  Chief Complaint: Fever, confusion  HPI: Richard Bradley is a 83 y.o. male with a past medical history significant for myasthenia gravis on prednisone 20 daily, Afib on warfarin, CHF EF 35% on home O2, and severe PAD requiring transmet amputation who presents with fever, lethargy.  All history collected from chart or patient's daughter at bedside, as patient is poor historian.    The patient first had left great toe amputation last fall in August.  This evidently progressed to chronic ulcer with osteo and by December he required amputation of the second toe.  This also healed poorly, and in Feb he was admitted for transmetatarsal amputation. Shortly after that hospitalization he was admitted with dry heaves, confusion, fever but no clear source on admission.  Blood cultures grew Pseudomonas and he was treated with Zosyn to Meropenem for 2 weeks under guidance of ID.  Repeat cultures also grew Candida albicans and he was treated with IV fluconazole to oral fluconazole for a total of 6 weeks.  Since then, he has been recuperating, had finally gotten back on his feet, was ambulating around the house again, doing work and things (he still has some business affairs back in [place], [place] where he is from that he is extricating himself from).  Recently, his foot wound had again dehisced and so he underwent an outpatient revision of his amputation by Dr. Sharol Given yesterday.  After the procedure, he was home, took a nap, but was up after his nap and had dinner with his daughter, then went to bed.  Then around 3AM she heard him in the bathroom dry heaving, so she went to see what was wrong and he was wretching for hours.  In the morning, she called his PCP's office who prescribed phenergan PR which he got.  However, shortly after that, his daughter noticed  he had a fever to 102F, and was starting to get lethargic, so she brought him to the ER.  ED course: -Temp 101F, heart rate 118, respirations 26, blood pressure 91/72, pulse oximetry 94% on home O2 -Baseline BP is in the range 270-350 systolic -Na 093, K 3.5, Cr 1.0 (baseline 1.0), WBC 14.9K, Hgb 12.2 -Lactic acid 1.4 -TBili 1.7 -Urinalysis showed pyuria and hematuria -Chest x-ray showed no focal opacity -He was given gentle IV fluids and ceftriaxone and TRH were asked to evaluate for sepsis, possible cellulitis RLE     ROS: Review of Systems  Constitutional: Positive for chills, fever and malaise/fatigue.  Respiratory: Negative for cough, sputum production and shortness of breath.   Gastrointestinal: Positive for nausea and vomiting.  Genitourinary: Negative for dysuria, frequency and urgency.  Skin: Positive for rash (redness right leg).  Neurological: Positive for weakness.  All other systems reviewed and are negative.         Past Medical History:  Diagnosis Date  . Atrial fibrillation ([facility])   . Cellulitis    left arm  . CHF (congestive heart failure) ([facility])   . Dyspnea    oxygen prn  . Dysrhythmia    afib  . GERD (gastroesophageal reflux disease)    ocassional  . History of kidney stones    passed  . Myasthenia gravis   . Osteoarthritis   . Pneumonia    Hstory of  . 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 Prostate cancer ([facility]) dx'd 1982   treated with surgery and radiation.  Basal cell cancer- many  . Sepsis ([facility])   . Urine incontinence   . Varicose veins of left lower extremity     Past Surgical History:  Procedure Laterality Date  . AMPUTATION Left 03/20/2016   Procedure: Left Great Toe Amputation at Metatarsophalangeal Joint;  Surgeon: Newt Minion, MD;  Location: [facility];  Service: Orthopedics;  Laterality: Left;  . AMPUTATION Left 06/11/2016   Procedure: Left 2nd Toe Amputation;  Surgeon: Newt Minion, MD;  Location: [facility];  Service:  Orthopedics;  Laterality: Left;  . AMPUTATION Left 07/12/2016   Procedure: LEFT 3rd TOE AMPUTATION;  Surgeon: Newt Minion, MD;  Location: [facility];  Service: Orthopedics;  Laterality: Left;  . AMPUTATION Left 09/11/2016   Procedure: Left Transmetatarsal Amputation;  Surgeon: Newt Minion, MD;  Location: [facility];  Service: Orthopedics;  Laterality: Left;  . appendectomy    . APPENDECTOMY    . COLONOSCOPY    . HERNIA REPAIR Bilateral    Inguinal  . I&D EXTREMITY Left 10/12/2014   Procedure: IRRIGATION AND DEBRIDEMENT INDEX FINGER;  Surgeon: Iran Planas, MD;  Location: [facility];  Service: Orthopedics;  Laterality: Left;  . KYPHOPLASTY  2012 ?   T 12- L1  . LAMINECTOMY  2005 ?   lumbar- 4-5  . PROSTATE SURGERY    . ROTATOR CUFF REPAIR Right   . STUMP REVISION Left 12/11/2016   Procedure: Revision Left Transmetatarsal Amputation, Apply Wound VAC;  Surgeon: Newt Minion, MD;  Location: [facility];  Service: Orthopedics;  Laterality: Left;  . TOTAL KNEE ARTHROPLASTY Right     Social History: Patient lives with his daughter here in [place] now, having moved up from [place].  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  The patient walks with a walker since his amputation, although has been NWB most of that time.  Never smoker.    Allergies  Allergen Reactions  . Tape Other (See Comments)    PATIENT'S SKIN IS VERY THIN AND WILL TEAR AND BRUISE VERY EASILY!! Paper take is ok    Family history: family history includes Dementia in his sister; Heart attack in his father; Heart disease in his brother, father, paternal grandfather, and sister; Hypertension in his father; Parkinsonism in his sister; Stroke in his mother.  Prior to Admission medications   Medication Sig Start Date End Date Taking? Authorizing Provider  Cholecalciferol (VITAMIN D) 2000 units CAPS Take 2,000 Units by mouth daily.   Yes [provider]  escitalopram (LEXAPRO) 5 MG tablet Take 5 mg by mouth at bedtime. 09/15/16  Yes [provider]  FIBER PO Take 1 tablet by mouth 3 (three) times daily.    Yes [provider]  fluticasone (FLONASE) 50 MCG/ACT nasal spray Place 2 sprays into both nostrils 2 (two) times daily.    Yes [provider]  furosemide (LASIX) 40 MG tablet Take 1 tablet (40 mg total) by mouth 2 (two) times daily. 10/02/16  Yes Rai, Ripudeep K, MD  guaiFENesin (MUCINEX) 600 MG 12 hr tablet Take 600 mg by mouth 2 (two) times daily as needed for to loosen phlegm.   Yes [provider]  HYDROcodone-acetaminophen (NORCO) 10-325 MG per tablet Take 1 tablet by mouth every 6 (six) hours as needed (for pain).    Yes  [provider]  metoprolol tartrate (LOPRESSOR) 25 MG tablet Take 1 tablet (25 mg total) by mouth 2 (two) times daily. 10/02/16  Yes Rai, Ripudeep K, MD  pantoprazole (PROTONIX) 40 MG tablet Take 1 tablet (40 mg total) by mouth daily. 10/02/16  Yes Rai, Ripudeep K, MD  polyethylene glycol (MIRALAX / GLYCOLAX) packet Take 17 g by mouth daily as needed for moderate constipation. For constipation Patient taking differently: Take 17 g by mouth daily as needed for mild constipation or moderate constipation.  10/02/16  Yes Rai, Ripudeep K, MD  potassium chloride SA (K-DUR,KLOR-CON) 20 MEQ tablet Take 1 tablet (20 mEq total) by mouth daily. Patient taking differently: Take 20 mEq by mouth daily with lunch.  10/02/16  Yes Rai, Ripudeep K, MD  predniSONE (DELTASONE) 10 MG tablet Take 2 tablets (20 mg total) by mouth daily with breakfast. Take 40mg  for 1 more day, then continue 20mg  daily Patient taking differently: Take 20 mg by mouth daily with breakfast.  10/02/16  Yes Rai, Ripudeep K, MD  vitamin B-12 (CYANOCOBALAMIN) 1000 MCG tablet Take 1,000 mcg by mouth daily with lunch.    Yes [provider]  warfarin (COUMADIN) 5 MG tablet Take as directed by coumadin  clinic Patient taking differently: Take 2.5-5 mg by mouth See admin instructions. 2.5 mg in the evening on Sun/Tues/Wed/Thurs/Sat and 5 mg on Mon/Fri 07/11/16  Yes Belva Crome, MD  ondansetron (ZOFRAN) 4 MG tablet Take 1 tablet (4 mg total) by mouth every 8 (eight) hours as needed for nausea or vomiting. 12/12/16   Suzan Slick, NP       Physical Exam: BP 107/88 (BP Location: Right Arm)   Pulse (!) 104   Temp 98.1 F (36.7 C) (Oral)   Resp (!) 22   Ht 6\' 1"  (1.854 m)   Wt 77 kg (169 lb 11.2 oz)   SpO2 92%   BMI 22.39 kg/m  General appearance: Frail elderly adult male, somnolent, sluggish, does not rouse to noxious stimuli.  Eyes: Anicteric, conjunctiva pink, lids and lashes normal. PERRL.    ENT: No nasal deformity, discharge, epistaxis.  Hearing unable to assess. OP moist without lesions.   Neck: No neck masses.  Trachea midline.  No thyromegaly/tenderness.  Neck supple. Lymph: No cervical or supraclavicular lymphadenopathy. Skin: Warm and flushed.  No jaundice.  Mild redness on right leg.  Cardiac: RRR, nl S1-S2, no murmurs appreciated.  Capillary refill is brisk.  JVP not visible.  No LE edema.  Radial pulses 2+ and symmetric. Respiratory: Normal respiratory rate and rhythm.  CTAB without rales or wheezes. Abdomen: Abdomen soft.  No TTP. No ascites, distension, hepatosplenomegaly.   MSK: No deformities or effusions.  No cyanosis or clubbing.  Left fot with wound vac in place. Neuro: Cranial nerves unable to assess, does not follow commands.  PERRL.  Groans to daughter's voice.   Psych: Unable to assess     Labs on Admission:  I have personally reviewed following labs and imaging studies: CBC:  Recent Labs Lab 12/11/16 0746 12/12/16 1556  WBC 10.5 14.9*  NEUTROABS  --  13.9*  HGB 11.9* 12.2*  HCT 37.8* 38.5*  MCV 98.4 98.0  PLT 198 300   Basic Metabolic Panel:  Recent Labs Lab 12/11/16 0746 12/12/16 1556  NA 140 137  K 3.3* 3.5  CL 105 103  CO2 26 26   GLUCOSE 103* 131*  BUN 23* 16  CREATININE 1.02 1.00  CALCIUM 8.6* 8.3*  GFR: Estimated Creatinine Clearance: 55.6 mL/min (by C-G formula based on SCr of 1 mg/dL).  Liver Function Tests:  Recent Labs Lab 12/12/16 1556  AST 36  ALT 14*  ALKPHOS 57  BILITOT 1.7*  PROT 5.5*  ALBUMIN 3.3*   No results for input(s): LIPASE, AMYLASE in the last 168 hours. No results for input(s): AMMONIA in the last 168 hours. Coagulation Profile:  Recent Labs Lab 12/10/16 1134 12/11/16 0746  INR 2.2 2.41   Cardiac Enzymes: No results for input(s): CKTOTAL, CKMB, CKMBINDEX, TROPONINI in the last 168 hours. BNP (last 3 results) No results for input(s): PROBNP in the last 8760 hours. HbA1C: No results for input(s): HGBA1C in the last 72 hours. CBG: No results for input(s): GLUCAP in the last 168 hours. Lipid Profile: No results for input(s): CHOL, HDL, LDLCALC, TRIG, CHOLHDL, LDLDIRECT in the last 72 hours. Thyroid Function Tests: No results for input(s): TSH, T4TOTAL, FREET4, T3FREE, THYROIDAB in the last 72 hours. Anemia Panel: No results for input(s): VITAMINB12, FOLATE, FERRITIN, TIBC, IRON, RETICCTPCT in the last 72 hours. Sepsis Labs: Lactic acid 1.4, 1.3 on repeat Invalid input(s): PROCALCITONIN, LACTICIDVEN No results found for this or any previous visit (from the past 240 hour(s)).       Radiological Exams on Admission: Personally reviewed CXR shows cardiomegaly, rotation; no focal opacity or pneumonia: Dg Chest Port 1 View  Result Date: 12/12/2016 CLINICAL DATA:  Lethargy and confusion EXAM: PORTABLE CHEST 1 VIEW COMPARISON:  September 29, 2016 FINDINGS: There is no edema or consolidation. There is extensive calcification in the hemithoraces consistent with calcified pleural plaques. This appearance is stable. There is stable cardiomegaly with pulmonary vascular within normal limits. There is aortic atherosclerosis. No evident adenopathy. Bones are osteoporotic. IMPRESSION: Stable  cardiomegaly.  There is aortic atherosclerosis. Calcified pleural plaques in the upper hemithoraces remain stable. There is no edema or consolidation. Electronically Signed   By: Lowella Grip III M.D.   On: 12/12/2016 16:52    EKG: Independently reviewed. Rate 94, atrial fibrillation, chronic.  Echocardiogram Feb 2018: EF 35-40% Severe MR Moderate TR PAP 52          Assessment/Plan Principal Problem:   Sepsis ([facility]) Active Problems:   Chronic atrial fibrillation ([facility])   Myasthenia gravis ([facility])   Benign essential HTN   Chronic systolic CHF (congestive heart failure) ([facility])  1. Sepsis:  Suspected source may be our LE cellulitis, although last hospitalization had pseudomonal bacteremia and candidemia with similar presentation.  Patient meets criteria given tachycardia, tachypnea, fever, leukocytosis, and evidence of organ dysfunction (hypotension, altered mental status).  Lactate normal and repeat ordered within 6 hours.  This patient is at high risk of poor outcomes with a qSOFA score of 3 (at least 2 of the following clinical criteria: respiratory rate of 22/min or greater, altered mentation, or systolic blood pressure of 100 mm Hg or less).  Antibiotics delivered in the ED.    -Sepsis bundle utilized:  -Blood and urine cultures drawn  -30 ml/kg bolus given in ED, will repeat lactic acid  -Antibiotics: vancomycin and Zosyn  -Repeat renal function and complete blood count in AM  -Code SEPSIS called to E-link  -Stress dose steroids   2. Elevated total bilirubin:  No focal signs on abdominal exam -Fractionate and trend bilirubin  3. Transmetatarsal foot amputation:  -Consult WOC -Norco for pain  4. Atrial fibrillation:  CHADS2Vasc 4.  On warfarin and metoprolol. -Continue warfarin -Reduce metoprolol dose until hemodynamics are clearer  5.  Chronic systolic CHF:  Appears euvolemic to hypovolemic, but BNP elevated.  EF 35% -Hold furosemide/K until fluid  resuscitation complete -Continue low dose metoprolol  6. Myasthenia gravis:  Patient's daughter reports his symptoms flare at doses <20 mg/day -Continue prednisone, stress-dose  7. Other medications:  -Continue Lexapro -Continue PPI     DVT prophylaxis: N/A  Code Status: Partial, call rapid response, do not CODE, DNI, no CPR/shocks  Family Communication: Daughter at [place]  Disposition Plan: Anticipate IV fluids gently overnight, IV antibiotics and follow cultures in this chronically immunesuppressed individual with fever, leukocytosis, confusion Consults called: None Admission status: INPATIENT         Medical decision making: Patient seen at 20:15 PM on 12/12/2016.  The patient was discussed with Dr. Sabra Heck.  What exists of the patient's chart was reviewed in depth and summarized above.  Clinical condition: improving hemodynamically in the ER, mentation poor, will require STepdown level care.        Edwin Dada [facility] Pager [phone]      At the time of admission, it appears that the appropriate admission status for this patient is INPATIENT. This is judged to be reasonable and necessary in order to provide the required intensity of service to ensure the patient's safety given the presenting symptoms, physical exam findings, and initial radiographic and laboratory data in the context of their chronic comorbidities.  Together, these circumstances are felt to place him at high risk for further clinical deterioration threatening life, limb, or organ.   Patient requires inpatient status due to high intensity of service, high risk for further deterioration and high frequency of surveillance required because of this acute illness that poses a threat to life, limb or bodily function.  I certify that at the point of admission it is my clinical judgment that the patient will require inpatient hospital care spanning beyond 2 midnights from the point of  admission and that early discharge would result in unnecessary risk of decompensation and readmission or threat to life, limb or bodily function.

## 2016-12-13 LAB — COMPREHENSIVE METABOLIC PANEL
ALBUMIN: 2.7 g/dL — AB (ref 3.5–5.0)
ALK PHOS: 49 U/L (ref 38–126)
ALT: 12 U/L — AB (ref 17–63)
AST: 24 U/L (ref 15–41)
Anion gap: 9 (ref 5–15)
BILIRUBIN TOTAL: 1.7 mg/dL — AB (ref 0.3–1.2)
BUN: 13 mg/dL (ref 6–20)
CO2: 22 mmol/L (ref 22–32)
Calcium: 7.8 mg/dL — ABNORMAL LOW (ref 8.9–10.3)
Chloride: 108 mmol/L (ref 101–111)
Creatinine, Ser: 0.79 mg/dL (ref 0.61–1.24)
GFR calc Af Amer: >60 mL/min (ref >60)
GFR calc non Af Amer: >60 mL/min (ref >60)
GLUCOSE: 86 mg/dL (ref 65–99)
Potassium: 3.1 mmol/L — ABNORMAL LOW (ref 3.5–5.1)
Sodium: 139 mmol/L (ref 135–145)
TOTAL PROTEIN: 4.8 g/dL — AB (ref 6.5–8.1)

## 2016-12-13 LAB — CBC
HCT: 34.3 % — ABNORMAL LOW (ref 39.0–52.0)
Hemoglobin: 10.8 g/dL — ABNORMAL LOW (ref 13.0–17.0)
MCH: 31 pg (ref 26.0–34.0)
MCHC: 31.5 g/dL (ref 30.0–36.0)
MCV: 98.6 fL (ref 78.0–100.0)
Platelets: 140 10*3/uL — ABNORMAL LOW (ref 150–400)
RBC: 3.48 MIL/uL — ABNORMAL LOW (ref 4.22–5.81)
RDW: 15.5 % (ref 11.5–15.5)
WBC: 10.8 10*3/uL — ABNORMAL HIGH (ref 4.0–10.5)

## 2016-12-13 LAB — PROTIME-INR
INR: 2.67
Prothrombin Time: 28.9 seconds — ABNORMAL HIGH (ref 11.4–15.2)

## 2016-12-13 LAB — PROCALCITONIN: Procalcitonin: 0.21 ng/mL

## 2016-12-13 LAB — BILIRUBIN, FRACTIONATED(TOT/DIR/INDIR)
BILIRUBIN DIRECT: 0.3 mg/dL (ref 0.1–0.5)
Indirect Bilirubin: 1.3 mg/dL — ABNORMAL HIGH (ref 0.3–0.9)
Total Bilirubin: 1.6 mg/dL — ABNORMAL HIGH (ref 0.3–1.2)

## 2016-12-13 MED ORDER — WARFARIN - PHARMACIST DOSING INPATIENT
Freq: Every day | Status: DC
  Administered 2016-12-13 – 2016-12-15 (×3)

## 2016-12-13 MED ORDER — POTASSIUM CHLORIDE CRYS ER 20 MEQ PO TBCR
40.0000 meq | EXTENDED_RELEASE_TABLET | Freq: Two times a day (BID) | ORAL | Status: DC
  Administered 2016-12-13 (×2): 40 meq via ORAL
  Filled 2016-12-13 (×2): qty 2

## 2016-12-13 MED ORDER — WARFARIN SODIUM 2.5 MG PO TABS
2.5000 mg | ORAL_TABLET | ORAL | Status: DC
  Administered 2016-12-13 – 2016-12-15 (×3): 2.5 mg via ORAL
  Filled 2016-12-13 (×3): qty 1

## 2016-12-13 MED ORDER — WARFARIN SODIUM 5 MG PO TABS
5.0000 mg | ORAL_TABLET | ORAL | Status: AC
  Administered 2016-12-13: 5 mg via ORAL
  Filled 2016-12-13: qty 1

## 2016-12-13 MED ORDER — FUROSEMIDE 40 MG PO TABS
40.0000 mg | ORAL_TABLET | Freq: Every day | ORAL | Status: DC
  Administered 2016-12-13 – 2016-12-14 (×2): 40 mg via ORAL
  Filled 2016-12-13 (×2): qty 1

## 2016-12-13 NOTE — Progress Notes (Addendum)
[facility] PROGRESS NOTE  Treyson Axel [place] JEH:[id number] DOB: 1926/05/31 DOA: 12/12/2016   PCP: Josetta Huddle, MD     Assessment/Plan: Principal Problem:   Sepsis [facility]) Active Problems:   Chronic atrial fibrillation (HCC)   Myasthenia gravis ([place])   Benign essential HTN   Chronic systolic CHF (congestive heart failure) (HCC)   Altered mental status   83 y.o. male with a past medical history significant for myasthenia gravis on prednisone 20 daily, Afib on warfarin, CHF EF 35% on home O2, and severe PAD requiring transmet amputation who presents with fever, lethargy. Patient recently admitted for sepsis with Pseudomonas, treated with Zosyn and meropenem for 2 weeks under IV guidance. Subsequently also had Candida albicans and he was treated with IV fluconazole to oral fluconazole for a total of 6 weeks. Recently, his foot wound had again dehisced and so he underwent an outpatient revision of his amputation by Dr. Sharol Given  5/16 .shortly after that, his daughter noticed he had a fever to 102F, and was starting to get lethargic, so she brought him to the ER.   assessment and plan 1. Sepsis:  Suspected source may be our LE cellulitis, although last hospitalization had pseudomonal bacteremia and candidemia with similar presentation. Patient meets criteria given tachycardia, tachypnea, fever, leukocytosis, and evidence of organ dysfunction (hypotension, altered mental status).  Lactate normal    This patient is at high risk of poor outcomes with a qSOFA score of 3 (at least 2 of the following clinical criteria: respiratory rate of 22/min or greater, altered mentation, or systolic blood pressure of 100 mm Hg or less).  Antibiotics delivered in the ED.   -Sepsis bundle utilized: Chest x-ray, UA negative             -Blood and urine cultures to be followed closely, may need infectious disease consultation if continues to have fevers with no focal source of infection,deescalate abx based on  cx data             -30 ml/kg bolus given in ED, will repeat lactic acid             -Antibiotics: vancomycin and Zosyn, day #2             -Code SEPSIS on admission             -check pro-calcitonin-most likely source appears to be left foot due to recent surgery   2. Elevated total bilirubin:  No focal signs on abdominal exam Fractionate and trend bilirubin  3. Transmetatarsal foot amputation:  -Consult WOC -Norco for pain  4. Atrial fibrillation:  rate controlled CHADS2Vasc 4.  On warfarin with therapeutic INR and metoprolol. -Continue warfarin -Reduce metoprolol dose until hemodynamics are clearer  5. Chronic systolic CHF:  Appears euvolemic to hypovolemic, but BNP elevated.  EF 35% Resume furosemide  -Continue low dose metoprolol  6. Myasthenia gravis:  Patient's daughter reports his symptoms flare at doses <20 mg/day -Continue prednisone 30 mg daily,   7. Other medications:  -Continue Lexapro -Continue PPI  8 hypokalemia .Replete    DVT prophylaxsis Coumadin  CODE STATUS-partial code    Family Communication: Discussed in detail with the patient, all imaging results, lab results explained to the patient   Disposition Plan: Not ready for discharge       Consultants:None  Procedures-none     Antibiotics: Anti-infectives    Start     Dose/Rate Route Frequency Ordered Stop   12/13/16 1000  vancomycin (VANCOCIN) IVPB 750 mg/150 ml premix     750 mg 150 mL/hr over 60 Minutes Intravenous Every 12 hours 12/12/16 2017     12/13/16 0600  piperacillin-tazobactam (ZOSYN) IVPB 3.375 g     3.375 g 12.5 mL/hr over 240 Minutes Intravenous Every 8 hours 12/12/16 2017     12/12/16 2015  piperacillin-tazobactam (ZOSYN) IVPB 3.375 g     3.375 g 100 mL/hr over 30 Minutes Intravenous  Once 12/12/16 2009 12/12/16 2049   12/12/16 2015  vancomycin (VANCOCIN) 1,500 mg in sodium chloride 0.9 % 500 mL IVPB     1,500 mg 250 mL/hr over 120 Minutes Intravenous   Once 12/12/16 2009 12/12/16 2249   12/12/16 1900  cefTRIAXone (ROCEPHIN) 1 g in dextrose 5 % 50 mL IVPB     1 g 100 mL/hr over 30 Minutes Intravenous  Once 12/12/16 1847 12/12/16 1950         HPI/Subjective: Right leg continues to be warm but nontender, daughter is by the bedside and states that the patient is improving and requesting home dose of Lasix to be restarted for his congestive heart failure  Objective: Vitals:   12/12/16 2100 12/12/16 2345 12/13/16 0435 12/13/16 0729  BP: 107/72 107/88 119/87 109/71  Pulse: (!) 55 (!) 104 (!) 106 97  Resp: (!) 39 (!) 22 20 (!) 26  Temp: 97.8 F (36.6 C) 98.1 F (36.7 C) 98.4 F (36.9 C) 98 F (36.7 C)  TempSrc: Oral Oral Oral Oral  SpO2: 92% 92% 92% 90%  Weight: 77 kg (169 lb 11.2 oz)     Height: 6\' 1"  (1.854 m)       Intake/Output Summary (Last 24 hours) at 12/13/16 0947 Last data filed at 12/13/16 0900  Gross per 24 hour  Intake             2150 ml  Output                0 ml  Net             2150 ml    Exam:  Examination:  General exam: Appears calm and comfortable  Respiratory system: Clear to auscultation. Respiratory effort normal. Cardiovascular system: S1 & S2 heard, RRR. No JVD, murmurs, rubs, gallops or clicks. No pedal edema. Gastrointestinal system: Abdomen is nondistended, soft and nontender. No organomegaly or masses felt. Normal bowel sounds heard. Central nervous system: Alert and oriented. No focal neurological deficits. Extremities: Symmetric 5 x 5 power. Skin: No rashes, lesions or ulcers Psychiatry: Judgement and insight appear normal. Mood & affect appropriate.     Data Reviewed: I have personally reviewed following labs and imaging studies  Micro Results Recent Results (from the past 240 hour(s))  MRSA PCR Screening     Status: None   Collection Time: 12/12/16  9:39 PM  Result Value Ref Range Status   MRSA by PCR NEGATIVE NEGATIVE Final    Comment:        The GeneXpert MRSA Assay  (FDA approved for NASAL specimens only), is one component of a comprehensive MRSA colonization surveillance program. It is not intended to diagnose MRSA infection nor to guide or monitor treatment for MRSA infections.     Radiology Reports Dg Chest Port 1 View  Result Date: 12/12/2016 CLINICAL DATA:  Lethargy and confusion EXAM: PORTABLE CHEST 1 VIEW COMPARISON:  September 29, 2016 FINDINGS: There is no edema or consolidation. There is extensive calcification in the hemithoraces consistent with calcified pleural plaques.  This appearance is stable. There is stable cardiomegaly with pulmonary vascular within normal limits. There is aortic atherosclerosis. No evident adenopathy. Bones are osteoporotic. IMPRESSION: Stable cardiomegaly.  There is aortic atherosclerosis. Calcified pleural plaques in the upper hemithoraces remain stable. There is no edema or consolidation. Electronically Signed   By: Lowella Grip III M.D.   On: 12/12/2016 16:52     CBC  Recent Labs Lab 12/11/16 0746 12/12/16 1556 12/13/16 0249  WBC 10.5 14.9* 10.8*  HGB 11.9* 12.2* 10.8*  HCT 37.8* 38.5* 34.3*  PLT 198 162 140*  MCV 98.4 98.0 98.6  MCH 31.0 31.0 31.0  MCHC 31.5 31.7 31.5  RDW 15.2 15.3 15.5  LYMPHSABS  --  0.3*  --   MONOABS  --  0.7  --   EOSABS  --  0.0  --   BASOSABS  --  0.0  --     Chemistries   Recent Labs Lab 12/11/16 0746 12/12/16 1556 12/12/16 2233 12/13/16 0249  NA 140 137  --  139  K 3.3* 3.5  --  3.1*  CL 105 103  --  108  CO2 26 26  --  22  GLUCOSE 103* 131*  --  86  BUN 23* 16  --  13  CREATININE 1.02 1.00  --  0.79  CALCIUM 8.6* 8.3*  --  7.8*  AST  --  36  --  24  ALT  --  14*  --  12*  ALKPHOS  --  57  --  49  BILITOT  --  1.7* 1.6* 1.7*   ------------------------------------------------------------------------------------------------------------------ estimated creatinine clearance is 69.5 mL/min (by C-G formula based on SCr of 0.79  mg/dL). ------------------------------------------------------------------------------------------------------------------ No results for input(s): HGBA1C in the last 72 hours. ------------------------------------------------------------------------------------------------------------------ No results for input(s): CHOL, HDL, LDLCALC, TRIG, CHOLHDL, LDLDIRECT in the last 72 hours. ------------------------------------------------------------------------------------------------------------------ No results for input(s): TSH, T4TOTAL, T3FREE, THYROIDAB in the last 72 hours.  Invalid input(s): FREET3 ------------------------------------------------------------------------------------------------------------------ No results for input(s): VITAMINB12, FOLATE, FERRITIN, TIBC, IRON, RETICCTPCT in the last 72 hours.  Coagulation profile  Recent Labs Lab 12/10/16 1134 12/11/16 0746 12/12/16 2233 12/13/16 0249  INR 2.2 2.41 2.73 2.67    No results for input(s): DDIMER in the last 72 hours.  Cardiac Enzymes No results for input(s): CKMB, TROPONINI, MYOGLOBIN in the last 168 hours.  Invalid input(s): CK ------------------------------------------------------------------------------------------------------------------ Invalid input(s): POCBNP   CBG: No results for input(s): GLUCAP in the last 168 hours.     Studies: Dg Chest Port 1 View  Result Date: 12/12/2016 CLINICAL DATA:  Lethargy and confusion EXAM: PORTABLE CHEST 1 VIEW COMPARISON:  September 29, 2016 FINDINGS: There is no edema or consolidation. There is extensive calcification in the hemithoraces consistent with calcified pleural plaques. This appearance is stable. There is stable cardiomegaly with pulmonary vascular within normal limits. There is aortic atherosclerosis. No evident adenopathy. Bones are osteoporotic. IMPRESSION: Stable cardiomegaly.  There is aortic atherosclerosis. Calcified pleural plaques in the upper hemithoraces  remain stable. There is no edema or consolidation. Electronically Signed   By: Lowella Grip III M.D.   On: 12/12/2016 16:52      Lab Results  Component Value Date   HGBA1C 5.6 09/26/2011   Lab Results  Component Value Date   CREATININE 0.79 12/13/2016       Scheduled Meds: . escitalopram  5 mg Oral QHS  . mouth rinse  15 mL Mouth Rinse BID  . metoprolol tartrate  12.5 mg Oral BID  . pantoprazole  40 mg Oral Daily  . potassium chloride  40 mEq Oral BID  . predniSONE  30 mg Oral Q breakfast  . warfarin  2.5 mg Oral Once per day on Sun Tue Wed Thu Sat  . warfarin  5 mg Oral Once per day on Mon Fri  . Warfarin - Pharmacist Dosing Inpatient   Does not apply q1800   Continuous Infusions: . piperacillin-tazobactam (ZOSYN)  IV 3.375 g (12/13/16 0522)  . vancomycin       LOS: 1 day    Time spent: >30 MINS    Reyne Dumas  [facility] Pager [phone]. If 7PM-7AM, please contact night-coverage at [email or web address], password [email or web address] 12/13/2016, 9:47 AM  LOS: 1 day

## 2016-12-13 NOTE — Consult Note (Signed)
[place] Nurse wound consult note Reason for Consult: left foot wound vac malfunction Wound type: full thickness surgical Pressure Injury POA: na Measurement: 11cm x 0.5cm Wound IRC:VELFY red granulating tissue Drainage (amount, consistency, odor) moderate bloody  Periwound: intact Dressing procedure/placement/frequency: Pt had outpt amputation revision in Dr. Jess Barters office two days ago.  Wound vac was placed to remain for 7 days and according to daughter, [name], with instructions if vac quit working or came off to discontinue and cover wound until appt next week.  Prevena vac drainage had a large clot occluding drainage.  Vac removed and dressing placed, orders written. Recommend you consult Dr. Sharol Given to inform pt has been admitted and vac removed. We will not follow, but will remain available to this patient, to nursing, and the medical and/or surgical teams.  Please re-consult if we need to assist further.    Fara Olden, RN-C, WTA-C Wound Treatment Associate

## 2016-12-13 NOTE — Progress Notes (Signed)
Bleeding noted on left foot stump in minimal amount, redressed and wrapped with kerlix. Continue to monitor.

## 2016-12-13 NOTE — Care Management Note (Signed)
Case Management Note  Patient Details  Name: Richard Bradley MRN: [id number] Date of Birth: 08/17/1925  Subjective/Objective:    PT admitted with AMS                Action/Plan:   PTA from home.  Per bedside nurse - pt is extremely deconditioned and needs assistance in the room.  CM requested PT eval via physician sticky notes   Expected Discharge Date:                  Expected Discharge Plan:     In-House Referral:     Discharge planning Services  CM Consult  Post Acute Care Choice:    Choice offered to:     DME Arranged:    DME Agency:     HH Arranged:    HH Agency:     Status of Service:     If discussed at [employer], dates discussed:    Additional Comments:  Maryclare Labrador, RN 12/13/2016, 9:54 AM

## 2016-12-13 NOTE — Progress Notes (Signed)
ANTICOAGULATION CONSULT NOTE - Follow Up Consult  Pharmacy Consult for Coumadin Indication: atrial fibrillation  Allergies  Allergen Reactions  . Tape Other (See Comments)    PATIENT'S SKIN IS VERY THIN AND WILL TEAR AND BRUISE VERY EASILY!! Paper take is ok    Patient Measurements: Height: 6\' 1"  (185.4 cm) Weight: 169 lb 11.2 oz (77 kg) IBW/kg (Calculated) : 79.9  Vital Signs: Temp: 98.1 F (36.7 C) (05/17 2345) Temp Source: Oral (05/17 2345) BP: 107/88 (05/17 2345) Pulse Rate: 104 (05/17 2345)  Labs:  Recent Labs  12/10/16 1134 12/11/16 0746 12/12/16 1556 12/12/16 2233  HGB  --  11.9* 12.2*  --   HCT  --  37.8* 38.5*  --   PLT  --  198 162  --   LABPROT  --  26.7*  --  29.5*  INR 2.2 2.41  --  2.73  CREATININE  --  1.02 1.00  --    Assessment: 19 yom with hx of afib on warfarin admitted with AMS. Pharmacy consulted to does warfarin while inpatient. INR 2.73 (therapeutic) on admission.  PTA warfarin dose: 5mg  on Mon/Fri, 2.5mg  on AOD (last dose 5/16 PTA)  Goal of Therapy:  INR 2-3 Monitor platelets by anticoagulation protocol: Yes   Plan:  Continue home dose of Warfarin 2.3mg  daily except for 5mg  on Mon and Fri Daily INR  Sherlon Handing, PharmD, BCPS Clinical pharmacist, pager [phone] 12/13/2016,12:03 AM

## 2016-12-13 NOTE — Progress Notes (Signed)
ANTICOAGULATION CONSULT NOTE - Follow Up Consult  Pharmacy Consult for Coumadin Indication: atrial fibrillation  Allergies  Allergen Reactions  . Tape Other (See Comments)    PATIENT'S SKIN IS VERY THIN AND WILL TEAR AND BRUISE VERY EASILY!! Paper take is ok    Patient Measurements: Height: 6\' 1"  (185.4 cm) Weight: 169 lb 11.2 oz (77 kg) IBW/kg (Calculated) : 79.9  Vital Signs: Temp: 98 F (36.7 C) (05/18 0729) Temp Source: Oral (05/18 0729) BP: 109/71 (05/18 0729) Pulse Rate: 97 (05/18 0729)  Labs:  Recent Labs  12/11/16 0746 12/12/16 1556 12/12/16 2233 12/13/16 0249  HGB 11.9* 12.2*  --  10.8*  HCT 37.8* 38.5*  --  34.3*  PLT 198 162  --  140*  LABPROT 26.7*  --  29.5* 28.9*  INR 2.41  --  2.73 2.67  CREATININE 1.02 1.00  --  0.79    Estimated Creatinine Clearance: 69.5 mL/min (by C-G formula based on SCr of 0.79 mg/dL).  Assessment: 89yom continues on coumadin for afib. INR therapeutic at 2.67. Hgb 12.2>10.8, plts ok. No bleeding.  PTA dose: 2.5mg  daily except 5mg  on Mon/Fri  Goal of Therapy:  INR 2-3 Monitor platelets by anticoagulation protocol: Yes   Plan:  1) Continue home regimen - will get 5mg  tonight 2) Daily INR  Deboraha Sprang 12/13/2016,8:21 AM

## 2016-12-13 NOTE — Progress Notes (Signed)
Pt potasium level is 3.1 this morning. Provider was notified.  Ferdinand Lango, RN

## 2016-12-14 DIAGNOSIS — S91302A Unspecified open wound, left foot, initial encounter: Secondary | ICD-10-CM

## 2016-12-14 DIAGNOSIS — I4891 Unspecified atrial fibrillation: Secondary | ICD-10-CM | POA: Diagnosis present

## 2016-12-14 LAB — CBC
HCT: 33.8 % — ABNORMAL LOW (ref 39.0–52.0)
HEMOGLOBIN: 10.5 g/dL — AB (ref 13.0–17.0)
MCH: 30.5 pg (ref 26.0–34.0)
MCHC: 31.1 g/dL (ref 30.0–36.0)
MCV: 98.3 fL (ref 78.0–100.0)
Platelets: 153 10*3/uL (ref 150–400)
RBC: 3.44 MIL/uL — ABNORMAL LOW (ref 4.22–5.81)
RDW: 15.3 % (ref 11.5–15.5)
WBC: 10.3 10*3/uL (ref 4.0–10.5)

## 2016-12-14 LAB — COMPREHENSIVE METABOLIC PANEL
ALBUMIN: 2.7 g/dL — AB (ref 3.5–5.0)
ALK PHOS: 46 U/L (ref 38–126)
ALT: 13 U/L — AB (ref 17–63)
ANION GAP: 8 (ref 5–15)
AST: 26 U/L (ref 15–41)
BUN: 11 mg/dL (ref 6–20)
CALCIUM: 8.2 mg/dL — AB (ref 8.9–10.3)
CO2: 24 mmol/L (ref 22–32)
Chloride: 109 mmol/L (ref 101–111)
Creatinine, Ser: 0.86 mg/dL (ref 0.61–1.24)
GFR calc Af Amer: >60 mL/min (ref >60)
GFR calc non Af Amer: >60 mL/min (ref >60)
GLUCOSE: 109 mg/dL — AB (ref 65–99)
Potassium: 3.3 mmol/L — ABNORMAL LOW (ref 3.5–5.1)
SODIUM: 141 mmol/L (ref 135–145)
Total Bilirubin: 1.2 mg/dL (ref 0.3–1.2)
Total Protein: 5.3 g/dL — ABNORMAL LOW (ref 6.5–8.1)

## 2016-12-14 LAB — PROTIME-INR
INR: 2.67
Prothrombin Time: 28.9 seconds — ABNORMAL HIGH (ref 11.4–15.2)

## 2016-12-14 MED ORDER — POTASSIUM CHLORIDE CRYS ER 20 MEQ PO TBCR
40.0000 meq | EXTENDED_RELEASE_TABLET | Freq: Two times a day (BID) | ORAL | Status: AC
  Administered 2016-12-14 – 2016-12-15 (×4): 40 meq via ORAL
  Filled 2016-12-14 (×4): qty 2

## 2016-12-14 MED ORDER — WARFARIN SODIUM 5 MG PO TABS: 5.0000 mg | ORAL_TABLET | ORAL | Status: DC

## 2016-12-14 MED ORDER — DILTIAZEM HCL 25 MG/5ML IV SOLN
5.0000 mg | Freq: Once | INTRAVENOUS | Status: AC
  Administered 2016-12-14: 5 mg via INTRAVENOUS
  Filled 2016-12-14: qty 5

## 2016-12-14 MED ORDER — METOPROLOL TARTRATE 25 MG PO TABS
25.0000 mg | ORAL_TABLET | Freq: Two times a day (BID) | ORAL | Status: DC
  Administered 2016-12-14 – 2016-12-15 (×3): 25 mg via ORAL
  Filled 2016-12-14 (×3): qty 1

## 2016-12-14 NOTE — Progress Notes (Signed)
PROGRESS NOTE    Richard Bradley Richard Bradley  VHQ:[id number] DOB: 27-Apr-1926 DOA: 12/12/2016 PCP: Josetta Huddle, MD   Brief Narrative: 83 y.o.malewith a past medical history significant for myasthenia gravis on prednisone 20 daily, Afib on warfarin, CHF EF 35% on home O2, and severe PAD requiring transmet amputationwho presents with fever, lethargy. Patient recently admitted for sepsis with Pseudomonas, treated with Zosyn and meropenem for 2 weeks under IV guidance. Subsequently also had Candida albicans and he was treated with IV fluconazole to oral fluconazole for a total of 6 weeks. Recently, his foot wound had again dehisced and so he underwent an outpatient revision of his amputation by Dr. Sharol Given  5/16 . Shortly after that, his daughter noticed he had a fever to 102F, and was starting to get lethargic, so she brought him to the ER.  Assessment & Plan:   # Probable sepsis due to LE cellulitis on admission:  -Patient is afebrile. Cultures has been negative. Continue vancomycin and Zosyn. Follow up culture results. -CXR unremarkable  # Recent transmetatarsal foot amputation by Dr. Sharol Given: Evaluated by wound care, and reported moderate bloody discharge. Patient is currently on broad-spectrum antibiotics for possible cellulitis. The wound vac was removed. I consulted orthopedics and discussed with Dr. Durward Fortes.  #Atrial fibrillation with rapid ventricular response: I increased the dose of metoprolol to 25 twice a day, home dose. INR is therapeutic. Continue Coumadin. Continue to monitor heart rate. If heart rate is better controlled patient may be transferred to telemetry floor. Need to continue to evaluate if patient needs Cardizem drip to control heart rate. Discussed with the patient's nurse. -Monitor heart rate.  #Chronic systolic CHF: Clinically stable. Continue metoprolol and Lasix.  #History of myasthenia gravis: Continue chronic prednisone. Clinically stable.  #Hypokalemia: Repleted potassium  chloride. Monitor labs.  Principal Problem:   Sepsis ([place]) Active Problems:   Chronic atrial fibrillation (HCC)   Myasthenia gravis (HCC)   Benign essential HTN   Chronic systolic CHF (congestive heart failure) (HCC)   Altered mental status  DVT prophylaxis: Coumadin Code Status: Partial DO NOT RESUSCITATE Family Communication: Discussed with the patient's 2 daughters at bedside Disposition Plan: Likely discharge home in 1-2 days    Consultants:   Orthopedics  Procedures: None Antimicrobials: Vancomycin and Zosyn since 5/17  Subjective: Denied pain. No nausea vomiting chest pain or shortness of breath.  Objective: Vitals:   12/14/16 0700 12/14/16 0738 12/14/16 0933 12/14/16 1045  BP: 122/87 122/87    Pulse:  (!) 120 (!) 134 (!) 141  Resp: (!) 32 (!) 22    Temp:  99.2 F (37.3 C)    TempSrc:  Oral    SpO2:  98%  98%  Weight:      Height:        Intake/Output Summary (Last 24 hours) at 12/14/16 1122 Last data filed at 12/14/16 0640  Gross per 24 hour  Intake              250 ml  Output              100 ml  Net              150 ml   Filed Weights   12/12/16 2100  Weight: 77 kg (169 lb 11.2 oz)    Examination:  General exam: Appears calm and comfortable  Respiratory system: Clear to auscultation. Respiratory effort normal. No wheezing or crackle Cardiovascular system: Irregular heart rate, tachycardic, S1 and S2 normal.  No pedal edema.  Gastrointestinal system: Abdomen is nondistended, soft and nontender. Normal bowel sounds heard. Central nervous system: Alert and oriented. No focal neurological deficits. Extremities: Symmetric 5 x 5 power. Left foot with dressing on Skin: No rashes, lesions or ulcers      Data Reviewed: I have personally reviewed following labs and imaging studies  CBC:  Recent Labs Lab 12/11/16 0746 12/12/16 1556 12/13/16 0249 12/14/16 0359  WBC 10.5 14.9* 10.8* 10.3  NEUTROABS  --  13.9*  --   --   HGB 11.9* 12.2* 10.8*  10.5*  HCT 37.8* 38.5* 34.3* 33.8*  MCV 98.4 98.0 98.6 98.3  PLT 198 162 140* 720   Basic Metabolic Panel:  Recent Labs Lab 12/11/16 0746 12/12/16 1556 12/13/16 0249 12/14/16 0359  NA 140 137 139 141  K 3.3* 3.5 3.1* 3.3*  CL 105 103 108 109  CO2 26 26 22 24   GLUCOSE 103* 131* 86 109*  BUN 23* 16 13 11   CREATININE 1.02 1.00 0.79 0.86  CALCIUM 8.6* 8.3* 7.8* 8.2*   GFR: Estimated Creatinine Clearance: 64.7 mL/min (by C-G formula based on SCr of 0.86 mg/dL). Liver Function Tests:  Recent Labs Lab 12/12/16 1556 12/12/16 2233 12/13/16 0249 12/14/16 0359  AST 36  --  24 26  ALT 14*  --  12* 13*  ALKPHOS 57  --  49 46  BILITOT 1.7* 1.6* 1.7* 1.2  PROT 5.5*  --  4.8* 5.3*  ALBUMIN 3.3*  --  2.7* 2.7*   No results for input(s): LIPASE, AMYLASE in the last 168 hours. No results for input(s): AMMONIA in the last 168 hours. Coagulation Profile:  Recent Labs Lab 12/10/16 1134 12/11/16 0746 12/12/16 2233 12/13/16 0249 12/14/16 0359  INR 2.2 2.41 2.73 2.67 2.67   Cardiac Enzymes: No results for input(s): CKTOTAL, CKMB, CKMBINDEX, TROPONINI in the last 168 hours. BNP (last 3 results) No results for input(s): PROBNP in the last 8760 hours. HbA1C: No results for input(s): HGBA1C in the last 72 hours. CBG: No results for input(s): GLUCAP in the last 168 hours. Lipid Profile: No results for input(s): CHOL, HDL, LDLCALC, TRIG, CHOLHDL, LDLDIRECT in the last 72 hours. Thyroid Function Tests: No results for input(s): TSH, T4TOTAL, FREET4, T3FREE, THYROIDAB in the last 72 hours. Anemia Panel: No results for input(s): VITAMINB12, FOLATE, FERRITIN, TIBC, IRON, RETICCTPCT in the last 72 hours. Sepsis Labs:  Recent Labs Lab 12/12/16 1702 12/12/16 1954 12/13/16 1121  PROCALCITON  --   --  0.21  LATICACIDVEN 1.45 1.30  --     Recent Results (from the past 240 hour(s))  Blood Culture (routine x 2)     Status: None (Preliminary result)   Collection Time: 12/12/16  4:45  PM  Result Value Ref Range Status   Specimen Description BLOOD RIGHT ANTECUBITAL  Final   Special Requests   Final    BOTTLES DRAWN AEROBIC AND ANAEROBIC Blood Culture adequate volume   Culture NO GROWTH < 24 HOURS  Final   Report Status PENDING  Incomplete  Blood Culture (routine x 2)     Status: None (Preliminary result)   Collection Time: 12/12/16  4:56 PM  Result Value Ref Range Status   Specimen Description BLOOD RIGHT ANTECUBITAL  Final   Special Requests   Final    BOTTLES DRAWN AEROBIC AND ANAEROBIC Blood Culture adequate volume   Culture NO GROWTH < 24 HOURS  Final   Report Status PENDING  Incomplete  MRSA PCR Screening     Status: None  Collection Time: 12/12/16  9:39 PM  Result Value Ref Range Status   MRSA by PCR NEGATIVE NEGATIVE Final    Comment:        The GeneXpert MRSA Assay (FDA approved for NASAL specimens only), is one component of a comprehensive MRSA colonization surveillance program. It is not intended to diagnose MRSA infection nor to guide or monitor treatment for MRSA infections.          Radiology Studies: Dg Chest Port 1 View  Result Date: 12/12/2016 CLINICAL DATA:  Lethargy and confusion EXAM: PORTABLE CHEST 1 VIEW COMPARISON:  September 29, 2016 FINDINGS: There is no edema or consolidation. There is extensive calcification in the hemithoraces consistent with calcified pleural plaques. This appearance is stable. There is stable cardiomegaly with pulmonary vascular within normal limits. There is aortic atherosclerosis. No evident adenopathy. Bones are osteoporotic. IMPRESSION: Stable cardiomegaly.  There is aortic atherosclerosis. Calcified pleural plaques in the upper hemithoraces remain stable. There is no edema or consolidation. Electronically Signed   By: Lowella Grip III M.D.   On: 12/12/2016 16:52        Scheduled Meds: . escitalopram  5 mg Oral QHS  . furosemide  40 mg Oral Daily  . mouth rinse  15 mL Mouth Rinse BID  . metoprolol  tartrate  25 mg Oral BID  . pantoprazole  40 mg Oral Daily  . potassium chloride  40 mEq Oral BID  . predniSONE  30 mg Oral Q breakfast  . warfarin  2.5 mg Oral Once per day on Sun Tue Wed Thu Sat  . [START ON 12/16/2016] warfarin  5 mg Oral Once per day on Mon Fri  . Warfarin - Pharmacist Dosing Inpatient   Does not apply q1800   Continuous Infusions: . piperacillin-tazobactam (ZOSYN)  IV 3.375 g (12/14/16 0640)  . vancomycin 750 mg (12/14/16 0935)     LOS: 2 days    Justis Dupas Tanna Furry, MD [facility] Pager [phone]  If 7PM-7AM, please contact night-coverage [email or web address] Password [email or web address] 12/14/2016, 11:22 AM

## 2016-12-14 NOTE — Consult Note (Signed)
Joni Fears, MD   Biagio Borg, PA-C [address] [place], [place]  [address]                             [phone]   ORTHOPAEDIC CONSULTATION  Richard Bradley            MRN:  [id number] DOB/SEX:  1926/05/08/male    REQUESTING PHYSICIAN:  Dr. Carolin Sicks  CHIEF COMPLAINT:Fever, confusion. S/P transmet amputation    HISTORY: Richard Bradley a 83 y.o. male with Fever, confusion.  12/11/2016 S/P transmet amputation. Asked to evaluate foot wound as possible source of fever.   PAST MEDICAL HISTORY: Patient Active Problem List   Diagnosis Date Noted  . Atrial fibrillation with RVR ([place])   . Altered mental status   . Sepsis ([place]) 12/12/2016  . Chronic systolic CHF (congestive heart failure) ([place]) 12/12/2016  . Wound dehiscence, surgical 10/03/2016  . Acute respiratory distress   . Goals of care, counseling/discussion   . Palliative care by specialist   . Hypokalemia 09/21/2016  . Benign essential HTN 09/21/2016  . Amputated toe, left ([place]) 06/18/2016  . Anticoagulated on Coumadin 12/20/2013  . Myasthenia gravis ([place]) 06/27/2011  . Chronic atrial fibrillation [facility])    Past Medical History:  Diagnosis Date  . Atrial fibrillation ([place])   . Cellulitis    left arm  . CHF (congestive heart failure) ([place])   . Dyspnea    oxygen prn  . Dysrhythmia    afib  . GERD (gastroesophageal reflux disease)    ocassional  . History of kidney stones    passed  . Myasthenia gravis   . Osteoarthritis   . Pneumonia    Hstory of  . 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 Prostate cancer ([place]) dx'd 1982   treated with surgery and radiation.  Basal cell cancer- many  . Sepsis ([place])   . Urine incontinence   . Varicose veins of left lower extremity    Past Surgical History:  Procedure Laterality Date  . AMPUTATION Left 03/20/2016   Procedure: Left Great Toe Amputation at Metatarsophalangeal Joint;  Surgeon: Newt Minion, MD;  Location: [place];  Service:  Orthopedics;  Laterality: Left;  . AMPUTATION Left 06/11/2016   Procedure: Left 2nd Toe Amputation;  Surgeon: Newt Minion, MD;  Location: [place];  Service: Orthopedics;  Laterality: Left;  . AMPUTATION Left 07/12/2016   Procedure: LEFT 3rd TOE AMPUTATION;  Surgeon: Newt Minion, MD;  Location: [place];  Service: Orthopedics;  Laterality: Left;  . AMPUTATION Left 09/11/2016   Procedure: Left Transmetatarsal Amputation;  Surgeon: Newt Minion, MD;  Location: [place];  Service: Orthopedics;  Laterality: Left;  . appendectomy    . APPENDECTOMY    . COLONOSCOPY    . HERNIA REPAIR Bilateral    Inguinal  . I&D EXTREMITY Left 10/12/2014   Procedure: IRRIGATION AND DEBRIDEMENT INDEX FINGER;  Surgeon: Iran Planas, MD;  Location: [place];  Service: Orthopedics;  Laterality: Left;  . KYPHOPLASTY  2012 ?   T 12- L1  . LAMINECTOMY  2005 ?   lumbar- 4-5  . PROSTATE SURGERY    . ROTATOR CUFF REPAIR Right   . STUMP REVISION Left 12/11/2016   Procedure: Revision Left Transmetatarsal Amputation, Apply Wound VAC;  Surgeon: Newt Minion, MD;  Location: [place];  Service: Orthopedics;  Laterality: Left;  . TOTAL KNEE ARTHROPLASTY Right     MEDICATIONS:  Current Facility-Administered Medications:  .  escitalopram (LEXAPRO) tablet 5 mg, 5 mg, Oral, QHS, Danford, Suann Larry, MD, 5 mg at 12/13/16 2208 .  furosemide (LASIX) tablet 40 mg, 40 mg, Oral, Daily, Abrol, Nayana, MD, 40 mg at 12/14/16 0934 .  HYDROcodone-acetaminophen (NORCO) 10-325 MG per tablet 1 tablet, 1 tablet, Oral, Q6H PRN, Danford, Suann Larry, MD .  MEDLINE mouth rinse, 15 mL, Mouth Rinse, BID, Danford, Suann Larry, MD, 15 mL at 12/14/16 0938 .  metoprolol tartrate (LOPRESSOR) tablet 25 mg, 25 mg, Oral, BID, Rosita Fire, MD, 25 mg at 12/14/16 0934 .  ondansetron (ZOFRAN) tablet 4 mg, 4 mg, Oral, Q6H PRN **OR** ondansetron (ZOFRAN) injection 4 mg, 4 mg, Intravenous, Q6H PRN, Danford, Christopher P, MD .  pantoprazole (PROTONIX) EC  tablet 40 mg, 40 mg, Oral, Daily, Danford, Suann Larry, MD, 40 mg at 12/14/16 0932 .  piperacillin-tazobactam (ZOSYN) IVPB 3.375 g, 3.375 g, Intravenous, Q8H, Rolla Flatten, [facility], Last Rate: 12.5 mL/hr at 12/14/16 0640, 3.375 g at 12/14/16 0640 .  potassium chloride SA (K-DUR,KLOR-CON) CR tablet 40 mEq, 40 mEq, Oral, BID, Rosita Fire, MD, 40 mEq at 12/14/16 0932 .  predniSONE (DELTASONE) tablet 30 mg, 30 mg, Oral, Q breakfast, Danford, Suann Larry, MD, 30 mg at 12/14/16 0933 .  vancomycin (VANCOCIN) IVPB 750 mg/150 ml premix, 750 mg, Intravenous, Q12H, Rolla Flatten, [facility], Last Rate: 150 mL/hr at 12/14/16 0935, 750 mg at 12/14/16 0935 .  warfarin (COUMADIN) tablet 2.5 mg, 2.5 mg, Oral, Once per day on Sun Tue Wed Thu Sat, Amend, Caron G, RPH, 2.5 mg at 12/13/16 0105 .  [START ON 12/16/2016] warfarin (COUMADIN) tablet 5 mg, 5 mg, Oral, Once per day on Mon Fri, Meyer, Andrew D, [facility] .  Warfarin - Pharmacist Dosing Inpatient, , Does not apply, q1800, Franky Macho, RPH  ALLERGIES:   Allergies  Allergen Reactions  . Tape Other (See Comments)    PATIENT'S SKIN IS VERY THIN AND WILL TEAR AND BRUISE VERY EASILY!! Paper take is ok    REVIEW OF SYSTEMS: REVIEWED IN DETAIL IN CHART  FAMILY HISTORY:   Family History  Problem Relation Age of Onset  . Heart disease Father   . Heart attack Father   . Hypertension Father   . Heart disease Brother   . Heart disease Paternal Grandfather   . Stroke Mother   . Parkinsonism Sister   . Heart disease Sister   . Dementia Sister     SOCIAL HISTORY:   reports that he has never smoked. He has never used smokeless tobacco. He reports that he drinks about 1.2 oz of alcohol per week . He reports that he does not use drugs.   EXAMINATION: Vital signs in last 24 hours: Temp:  [97.7 F (36.5 C)-99.2 F (37.3 C)] 99.2 F (37.3 C) (05/19 0738) Pulse Rate:  [89-141] 141 (05/19 1045) Resp:  [22-48] 22 (05/19 0738) BP: (88-122)/(62-95)  122/87 (05/19 0738) SpO2:  [86 %-100 %] 98 % (05/19 1045)   Musculoskeletal Exam  :Left foot wound appears to be clean without drainage.  Some separation of very lateral incision. Appears superficial. No significant erythema. No swelling. No ascending lymphangitis.   DIAGNOSTIC STUDIES: Recent laboratory studies:  Recent Labs  12/11/16 0746 12/12/16 1556 12/13/16 0249 12/14/16 0359  WBC 10.5 14.9* 10.8* 10.3  HGB 11.9* 12.2* 10.8* 10.5*  HCT 37.8* 38.5* 34.3* 33.8*  PLT 198 162 140* 153    Recent Labs  12/11/16 0746  12/12/16 1556 12/13/16 0249 12/14/16 0359  NA 140 137 139 141  K 3.3* 3.5 3.1* 3.3*  CL 105 103 108 109  CO2 26 26 22 24   BUN 23* 16 13 11   CREATININE 1.02 1.00 0.79 0.86  GLUCOSE 103* 131* 86 109*  CALCIUM 8.6* 8.3* 7.8* 8.2*   Lab Results  Component Value Date   INR 2.67 12/14/2016   INR 2.67 12/13/2016   INR 2.73 12/12/2016     Recent Radiographic Studies :  Dg Chest Port 1 View  Result Date: 12/12/2016 CLINICAL DATA:  Lethargy and confusion EXAM: PORTABLE CHEST 1 VIEW COMPARISON:  September 29, 2016 FINDINGS: There is no edema or consolidation. There is extensive calcification in the hemithoraces consistent with calcified pleural plaques. This appearance is stable. There is stable cardiomegaly with pulmonary vascular within normal limits. There is aortic atherosclerosis. No evident adenopathy. Bones are osteoporotic. IMPRESSION: Stable cardiomegaly.  There is aortic atherosclerosis. Calcified pleural plaques in the upper hemithoraces remain stable. There is no edema or consolidation. Electronically Signed   By: Lowella Grip III M.D.   On: 12/12/2016 16:52    ASSESSMENT: s/p left transmet amputation without obvious infection   PLAN: Dressing changed  Follow up with Dr Sharol Given as scheduled   Biagio Borg 12/14/2016, 11:59 AM  P

## 2016-12-14 NOTE — Progress Notes (Signed)
ANTICOAGULATION CONSULT NOTE - Follow Up Consult  Pharmacy Consult for Coumadin Indication: atrial fibrillation  Allergies  Allergen Reactions  . Tape Other (See Comments)    PATIENT'S SKIN IS VERY THIN AND WILL TEAR AND BRUISE VERY EASILY!! Paper take is ok    Patient Measurements: Height: 6\' 1"  (185.4 cm) Weight: 169 lb 11.2 oz (77 kg) IBW/kg (Calculated) : 79.9  Vital Signs: Temp: 99.2 F (37.3 C) (05/19 0738) Temp Source: Oral (05/19 0738) BP: 122/87 (05/19 0738) Pulse Rate: 141 (05/19 1045)  Labs:  Recent Labs  12/12/16 1556 12/12/16 2233 12/13/16 0249 12/14/16 0359  HGB 12.2*  --  10.8* 10.5*  HCT 38.5*  --  34.3* 33.8*  PLT 162  --  140* 153  LABPROT  --  29.5* 28.9* 28.9*  INR  --  2.73 2.67 2.67  CREATININE 1.00  --  0.79 0.86    Estimated Creatinine Clearance: 64.7 mL/min (by C-G formula based on SCr of 0.86 mg/dL).  Assessment: 89yom continues on coumadin for afib. INR therapeutic at 2.67, will need to watch with antibiotics.  PTA dose: 2.5mg  daily except 5mg  on Mon/Fri  Goal of Therapy:  INR 2-3 Monitor platelets by anticoagulation protocol: Yes   Plan:  1) Continue home regimen 2) Daily INR  Hildred Laser, Pharm D 12/14/2016 11:19 AM

## 2016-12-14 NOTE — Evaluation (Signed)
Physical Therapy Evaluation Patient Details Name: Richard Bradley MRN: [id number] DOB: 08/22/1925 Today's Date: 12/14/2016   History of Present Illness  83 y.o. male  with past medical history of chronic A. Fib on coumadin, dCHF, Myasthenia Gravis, Prostate Cancer, Basal Cell cancer, left transmet amputation, urolithiasis, and Osteoarthritis who was admitted with sepsis from LEft transmet wound, revision on 5/16  Clinical Impression  Pt very pleasant and eager to get OOB. Pt has been living with daughter [name] since his transmet amputation and has transitioned from NWB with WC to WBAT and walking with RW to now back to NWB and WC due to wound dehiscence and trying to avoid further amputation. Pt with assist of family and aid at home and would benefit from education for sliding board transfers to ease transitions and maintain weight off LLE. Pt with decreased strength, transfers, balance and mobility who will benefit from acute therapy for transfer training to improve adherence to precautions.   HR 138-141 with activity with return to 115 at rest, sats 98% on 3L     Follow Up Recommendations No PT follow up (per daughter they are familar with transfers and HEP and do not need HHPT)    Equipment Recommendations  Other (comment) (sliding board, drop arm 3in1)    Recommendations for Other Services       Precautions / Restrictions Precautions Precautions: Fall Restrictions LLE Weight Bearing: Non weight bearing Other Position/Activity Restrictions: per RN from [place], however family report pt has to put weight on left heel to transfer and MD aware      Mobility  Bed Mobility Overal bed mobility: Modified Independent             General bed mobility comments: with rail and increased time  Transfers Overall transfer level: Needs assistance   Transfers: Squat Pivot Transfers     Squat pivot transfers: Min guard     General transfer comment: cues for sequence and blocking  left foot. Pt weight bearing on heel on left foot as he is unable to clear sacrum or transfer without weight on LLE. Pt with cues for setup similar to home. Pt would benefit from trial of sliding board to decrease weight on LLE  Ambulation/Gait             General Gait Details: unable  Stairs            Wheelchair Mobility    Modified Rankin (Stroke Patients Only)       Balance Overall balance assessment: Needs assistance   Sitting balance-Leahy Scale: Good                                       Pertinent Vitals/Pain Pain Assessment: No/denies pain    Home Living Family/patient expects to be discharged to:: Private residence Living Arrangements: Children Available Help at Discharge: Family;Available PRN/intermittently;Personal care attendant Type of Home: House Home Access: Ramped entrance     Home Layout: Multi-level;Able to live on main level with bedroom/bathroom Home Equipment: Gilford Rile - 2 wheels;Cane - single point;Bedside commode;Shower seat;Tub bench;Grab bars - tub/shower;Wheelchair - manual      Prior Function Level of Independence: Needs assistance   Gait / Transfers Assistance Needed: pt had been NWB using a WC, then transitioned to walking with RW prior to latest revision and now back to NWB for WC transfers with supervision for safety  ADL's / [place]  Needed: daughter does the homemaking and meal prep, aides assist with bathing, dressing and transfers  Comments: Prior to transmet pt was working and caring for himself. He now lives with daughter in split level with aides.      Hand Dominance        Extremity/Trunk Assessment   Upper Extremity Assessment Upper Extremity Assessment: Generalized weakness    Lower Extremity Assessment Lower Extremity Assessment: Generalized weakness    Cervical / Trunk Assessment Cervical / Trunk Assessment: Kyphotic  Communication   Communication: No difficulties  Cognition  Arousal/Alertness: Awake/alert Behavior During Therapy: WFL for tasks assessed/performed Overall Cognitive Status: Impaired/Different from baseline Area of Impairment: Safety/judgement                         Safety/Judgement: Decreased awareness of safety            General Comments      Exercises     Assessment/Plan    PT Assessment Patient needs continued PT services  PT Problem List Decreased activity tolerance;Decreased balance;Decreased knowledge of use of DME;Decreased skin integrity;Decreased mobility;Decreased safety awareness;Decreased strength       PT Treatment Interventions DME instruction;Therapeutic activities;Therapeutic exercise;Balance training;Functional mobility training;Patient/family education    PT Goals (Current goals can be found in the Care Plan section)  Acute Rehab PT Goals Patient Stated Goal: return home PT Goal Formulation: With family Time For Goal Achievement: 12/21/16 Potential to Achieve Goals: Good    Frequency Min 3X/week   Barriers to discharge        Co-evaluation               AM-PAC PT "6 Clicks" Daily Activity  Outcome Measure Difficulty turning over in bed (including adjusting bedclothes, sheets and blankets)?: A Little Difficulty moving from lying on back to sitting on the side of the bed? : A Lot Difficulty sitting down on and standing up from a chair with arms (e.g., wheelchair, bedside commode, etc,.)?: A Lot Help needed moving to and from a bed to chair (including a wheelchair)?: A Little Help needed walking in hospital room?: Total Help needed climbing 3-5 steps with a railing? : Total 6 Click Score: 12    End of Session Equipment Utilized During Treatment: Gait belt;Oxygen Activity Tolerance: Patient tolerated treatment well Patient left: in chair;with call bell/phone within reach;with family/visitor present;with chair alarm set;with nursing/sitter in room Nurse Communication: Mobility  status;Precautions;Weight bearing status PT Visit Diagnosis: Other abnormalities of gait and mobility (R26.89);Muscle weakness (generalized) (M62.81)    Time: 1324-4010 PT Time Calculation (min) (ACUTE ONLY): 18 min   Charges:   PT Evaluation $PT Eval Moderate Complexity: 1 Procedure     PT G Codes:        Elwyn Reach, PT [phone]   [place] 12/14/2016, 10:52 AM

## 2016-12-15 LAB — BASIC METABOLIC PANEL
ANION GAP: 9 (ref 5–15)
BUN: 10 mg/dL (ref 6–20)
CHLORIDE: 105 mmol/L (ref 101–111)
CO2: 26 mmol/L (ref 22–32)
Calcium: 8.1 mg/dL — ABNORMAL LOW (ref 8.9–10.3)
Creatinine, Ser: 0.83 mg/dL (ref 0.61–1.24)
GFR calc non Af Amer: >60 mL/min (ref >60)
GLUCOSE: 102 mg/dL — AB (ref 65–99)
Potassium: 3.4 mmol/L — ABNORMAL LOW (ref 3.5–5.1)
Sodium: 140 mmol/L (ref 135–145)

## 2016-12-15 LAB — URINE CULTURE: Culture: 10000 — AB

## 2016-12-15 LAB — PROTIME-INR
INR: 2.73
Prothrombin Time: 29.5 seconds — ABNORMAL HIGH (ref 11.4–15.2)

## 2016-12-15 LAB — MAGNESIUM: Magnesium: 1.9 mg/dL (ref 1.7–2.4)

## 2016-12-15 LAB — PROCALCITONIN: Procalcitonin: <0.1 ng/mL

## 2016-12-15 MED ORDER — POTASSIUM CHLORIDE 10 MEQ/100ML IV SOLN
10.0000 meq | INTRAVENOUS | Status: AC
  Administered 2016-12-15: 10 meq via INTRAVENOUS
  Filled 2016-12-15: qty 100

## 2016-12-15 MED ORDER — FLUTICASONE PROPIONATE 50 MCG/ACT NA SUSP
2.0000 | Freq: Two times a day (BID) | NASAL | Status: DC
  Administered 2016-12-15 – 2016-12-16 (×2): 2 via NASAL
  Filled 2016-12-15: qty 16

## 2016-12-15 MED ORDER — SACCHAROMYCES BOULARDII 250 MG PO CAPS
250.0000 mg | ORAL_CAPSULE | Freq: Two times a day (BID) | ORAL | Status: DC
  Administered 2016-12-15 – 2016-12-16 (×4): 250 mg via ORAL
  Filled 2016-12-15 (×4): qty 1

## 2016-12-15 MED ORDER — METOPROLOL TARTRATE 25 MG PO TABS
37.5000 mg | ORAL_TABLET | Freq: Two times a day (BID) | ORAL | Status: DC
  Administered 2016-12-15 – 2016-12-16 (×2): 37.5 mg via ORAL
  Filled 2016-12-15 (×2): qty 1

## 2016-12-15 MED ORDER — LOPERAMIDE HCL 2 MG PO CAPS
2.0000 mg | ORAL_CAPSULE | Freq: Once | ORAL | Status: AC
Start: 2016-12-15 — End: 2016-12-15
  Administered 2016-12-15: 2 mg via ORAL
  Filled 2016-12-15: qty 1

## 2016-12-15 MED ORDER — METOPROLOL TARTRATE 12.5 MG HALF TABLET
12.5000 mg | ORAL_TABLET | ORAL | Status: AC
  Administered 2016-12-15: 12.5 mg via ORAL
  Filled 2016-12-15: qty 1

## 2016-12-15 NOTE — Progress Notes (Signed)
Walked to the bathroom by PT, bleeding noted tofoot ,left dressing reinforced, elevated with pillow. Cont. To monitor.

## 2016-12-15 NOTE — Progress Notes (Signed)
Physical Therapy Treatment Patient Details Name: Richard Bradley MRN: [id number] DOB: 09-04-1925 Today's Date: 12/15/2016    History of Present Illness 83 y.o. male  with past medical history of chronic A. Fib on coumadin, dCHF, Myasthenia Gravis, Prostate Cancer, Basal Cell cancer, left transmet amputation, urolithiasis, and Osteoarthritis who was admitted with sepsis from LEft transmet wound, revision on 5/16    PT Comments    Pt is on [facility] when PT arrives as he has had multiple incidences of loose bowels this AM. Pt's daughter is present throughout session with and reports that pt has been functioning at this level prior to admission. Pt and daughter do not want to attempt sliding board transfer as they feel they are comfortable with squat pivot transfer. Pt will have 24hr care when he returns home and his daughter is a physical therapist. Pt is anticipating DC home tomorrow. Pt will benefit from continued work on transfers to ensure NWB on LLE.    Follow Up Recommendations  No PT follow up     Equipment Recommendations  Other (comment) (drop arm 3N1 commode)    Recommendations for Other Services       Precautions / Restrictions Precautions Precautions: Fall Restrictions Weight Bearing Restrictions: Yes LLE Weight Bearing: Non weight bearing    Mobility  Bed Mobility Overal bed mobility: Modified Independent             General bed mobility comments: with rail and increased time  Transfers Overall transfer level: Needs assistance Equipment used: None Transfers: Squat Pivot Transfers     Squat pivot transfers: Min guard     General transfer comment: Cues for sequencing. Pt is able to squat pivot from [facility] to bed with MIn guard and able to stand with majority of weight on RLE for NT to perform hyginene.   Ambulation/Gait                 Stairs            Wheelchair Mobility    Modified Rankin (Stroke Patients Only)       Balance Overall  balance assessment: Needs assistance Sitting-balance support: No upper extremity supported;Feet supported Sitting balance-Leahy Scale: Good     Standing balance support: Single extremity supported Standing balance-Leahy Scale: Fair                              Cognition Arousal/Alertness: Awake/alert Behavior During Therapy: WFL for tasks assessed/performed Overall Cognitive Status: Within Functional Limits for tasks assessed                                        Exercises      General Comments        Pertinent Vitals/Pain Pain Assessment: Faces Faces Pain Scale: Hurts a little bit Pain Location: LLE with mobility Pain Descriptors / Indicators: Grimacing Pain Intervention(s): Monitored during session    Home Living                      Prior Function            PT Goals (current goals can now be found in the care plan section) Acute Rehab PT Goals Patient Stated Goal: return home Progress towards PT goals: Progressing toward goals    Frequency    Min 3X/week  PT Plan Current plan remains appropriate    Co-evaluation              AM-PAC PT "6 Clicks" Daily Activity  Outcome Measure  Difficulty turning over in bed (including adjusting bedclothes, sheets and blankets)?: A Little Difficulty moving from lying on back to sitting on the side of the bed? : A Little Difficulty sitting down on and standing up from a chair with arms (e.g., wheelchair, bedside commode, etc,.)?: A Little Help needed moving to and from a bed to chair (including a wheelchair)?: A Little Help needed walking in hospital room?: Total Help needed climbing 3-5 steps with a railing? : Total 6 Click Score: 14    End of Session Equipment Utilized During Treatment: Gait belt;Oxygen Activity Tolerance: Patient tolerated treatment well Patient left: in bed;with call bell/phone within reach;with family/visitor present Nurse Communication:  Mobility status PT Visit Diagnosis: Other abnormalities of gait and mobility (R26.89);Muscle weakness (generalized) (M62.81)     Time: 8413-2440 PT Time Calculation (min) (ACUTE ONLY): 15 min  Charges:  $Therapeutic Activity: 8-22 mins                    G Codes:       Scheryl Marten PT, DPT  [phone]    Jacqulyn Liner Sloan Leiter 12/15/2016, 12:57 PM

## 2016-12-15 NOTE — Progress Notes (Addendum)
PROGRESS NOTE    Richard Bradley  GQQ:[id number] DOB: Jul 03, 1926 DOA: 12/12/2016 PCP: Josetta Huddle, MD   Brief Narrative: 83 y.o.malewith a past medical history significant for myasthenia gravis on prednisone 20 daily, Afib on warfarin, CHF EF 35% on home O2, and severe PAD requiring transmet amputationwho presents with fever, lethargy. Patient recently admitted for sepsis with Pseudomonas, treated with Zosyn and meropenem for 2 weeks under IV guidance. Subsequently also had Candida albicans and he was treated with IV fluconazole to oral fluconazole for a total of 6 weeks. Recently, his foot wound had again dehisced and so he underwent an outpatient revision of his amputation by Dr. Sharol Given  5/16 . Shortly after that, his daughter noticed he had a fever to 102F, and was starting to get lethargic, so she brought him to the ER.  Assessment & Plan:   # Probable sepsis due to LE cellulitis on admission vs UTI:  -Patient is afebrile. Cultures has been negative. MRSA PCR negative, discontinue vancomycin. Urine culture growing Pseudomonas sensitive to Zosyn. Continue for now. -CXR unremarkable. Reported that pt uses oxygen at home as needed.   # Pseudomonas urinary tract infection, site unspecified: Continue IV Zosyn for now.  # Recent transmetatarsal foot amputation by Dr. Sharol Given: Evaluated by wound care, and reported moderate bloody discharge. The wound vac was removed. Orthopedic consult appreciated. Recommended to follow up with Dr. Sharol Given outpatient.  #Atrial fibrillation with rapid ventricular response: Heart rate continues to increase. I increased the dose of metoprolol to 37.5 two times a day. Continue to monitor heart rate and blood pressure closely. I discussed this with the patient and his daughters at bedside.  INR is therapeutic. Continue Coumadin. Transfer to telemetry floor today. If patient's heart rate and blood pressure remains stable, he may be able to go home tomorrow.  #Chronic  systolic CHF: Clinically stable. Continue metoprolol and Lasix.  #History of myasthenia gravis: Continue chronic prednisone. Clinically stable.  #Hypokalemia: Repleted potassium chloride. Monitor labs. Magnesium level acceptable. Repeat lab in the morning.  Principal Problem:   Sepsis ([place]) Active Problems:   Chronic atrial fibrillation ([facility])   Myasthenia gravis (H[facility])   Benign essential HTN   Chronic systolic CHF (congestive heart failure) (HCC)   Altered mental status   Atrial fibrillation with RVR ([facility])  DVT prophylaxis: Coumadin Code Status: Partial DO NOT RESUSCITATE Family Communication: Discussed with the patient's 2 daughters at bedside Disposition Plan: Likely discharge home in 1-2 days    Consultants:   Orthopedics  Procedures: None Antimicrobials: Vancomycin  5/17-5/20 Zosyn 5/17-  Subjective: Denies headache, dizziness, nausea, vomiting, chest pain, shortness of breath. Daughter is at bedside.  Objective: Vitals:   12/15/16 0000 12/15/16 0336 12/15/16 0800 12/15/16 1223  BP: 107/86 119/82  95/79  Pulse: 70 (!) 118  (!) 102  Resp: (!) 32 (!) 30  (!) 22  Temp:  98.2 F (36.8 C) 98.6 F (37 C) 98.1 F (36.7 C)  TempSrc:  Oral Oral Oral  SpO2: (!) 86% 95%  99%  Weight:      Height:        Intake/Output Summary (Last 24 hours) at 12/15/16 1429 Last data filed at 12/15/16 1158  Gross per 24 hour  Intake              980 ml  Output             2577 ml  Net            -1597  ml   Filed Weights   12/12/16 2100  Weight: 77 kg (169 lb 11.2 oz)    Examination:  General exam: Not in distress  Respiratory system: Clear bilateral. Respiratory effort normal. No wheezing or crackle Cardiovascular system: Irregular heart rate tachycardic, S1-S2 normal.  No pedal edema. Gastrointestinal system: Abdomen soft, nontender, nondistended. Bowel sound positive Central nervous system: Alert and oriented. No focal neurological deficits. Extremities: Symmetric 5 x 5  power. Left foot with dressing on, unchanged and no sign of discharge. Skin: No rashes, lesions or ulcers      Data Reviewed: I have personally reviewed following labs and imaging studies  CBC:  Recent Labs Lab 12/11/16 0746 12/12/16 1556 12/13/16 0249 12/14/16 0359  WBC 10.5 14.9* 10.8* 10.3  NEUTROABS  --  13.9*  --   --   HGB 11.9* 12.2* 10.8* 10.5*  HCT 37.8* 38.5* 34.3* 33.8*  MCV 98.4 98.0 98.6 98.3  PLT 198 162 140* 462   Basic Metabolic Panel:  Recent Labs Lab 12/11/16 0746 12/12/16 1556 12/13/16 0249 12/14/16 0359 12/15/16 0536 12/15/16 0801  NA 140 137 139 141 140  --   K 3.3* 3.5 3.1* 3.3* 3.4*  --   CL 105 103 108 109 105  --   CO2 26 26 22 24 26   --   GLUCOSE 103* 131* 86 109* 102*  --   BUN 23* 16 13 11 10   --   CREATININE 1.02 1.00 0.79 0.86 0.83  --   CALCIUM 8.6* 8.3* 7.8* 8.2* 8.1*  --   MG  --   --   --   --   --  1.9   GFR: Estimated Creatinine Clearance: 67 mL/min (by C-G formula based on SCr of 0.83 mg/dL). Liver Function Tests:  Recent Labs Lab 12/12/16 1556 12/12/16 2233 12/13/16 0249 12/14/16 0359  AST 36  --  24 26  ALT 14*  --  12* 13*  ALKPHOS 57  --  49 46  BILITOT 1.7* 1.6* 1.7* 1.2  PROT 5.5*  --  4.8* 5.3*  ALBUMIN 3.3*  --  2.7* 2.7*   No results for input(s): LIPASE, AMYLASE in the last 168 hours. No results for input(s): AMMONIA in the last 168 hours. Coagulation Profile:  Recent Labs Lab 12/11/16 0746 12/12/16 2233 12/13/16 0249 12/14/16 0359 12/15/16 0536  INR 2.41 2.73 2.67 2.67 2.73   Cardiac Enzymes: No results for input(s): CKTOTAL, CKMB, CKMBINDEX, TROPONINI in the last 168 hours. BNP (last 3 results) No results for input(s): PROBNP in the last 8760 hours. HbA1C: No results for input(s): HGBA1C in the last 72 hours. CBG: No results for input(s): GLUCAP in the last 168 hours. Lipid Profile: No results for input(s): CHOL, HDL, LDLCALC, TRIG, CHOLHDL, LDLDIRECT in the last 72 hours. Thyroid  Function Tests: No results for input(s): TSH, T4TOTAL, FREET4, T3FREE, THYROIDAB in the last 72 hours. Anemia Panel: No results for input(s): VITAMINB12, FOLATE, FERRITIN, TIBC, IRON, RETICCTPCT in the last 72 hours. Sepsis Labs:  Recent Labs Lab 12/12/16 1702 12/12/16 1954 12/13/16 1121 12/15/16 0536  PROCALCITON  --   --  0.21 <0.10  LATICACIDVEN 1.45 1.30  --   --     Recent Results (from the past 240 hour(s))  Blood Culture (routine x 2)     Status: None (Preliminary result)   Collection Time: 12/12/16  4:45 PM  Result Value Ref Range Status   Specimen Description BLOOD RIGHT ANTECUBITAL  Final   Special Requests  Final    BOTTLES DRAWN AEROBIC AND ANAEROBIC Blood Culture adequate volume   Culture NO GROWTH 3 DAYS  Final   Report Status PENDING  Incomplete  Blood Culture (routine x 2)     Status: None (Preliminary result)   Collection Time: 12/12/16  4:56 PM  Result Value Ref Range Status   Specimen Description BLOOD RIGHT ANTECUBITAL  Final   Special Requests   Final    BOTTLES DRAWN AEROBIC AND ANAEROBIC Blood Culture adequate volume   Culture NO GROWTH 3 DAYS  Final   Report Status PENDING  Incomplete  Culture, Urine     Status: Abnormal   Collection Time: 12/12/16  6:18 PM  Result Value Ref Range Status   Specimen Description URINE, RANDOM  Final   Special Requests NONE  Final   Culture 10,000 COLONIES/mL PSEUDOMONAS AERUGINOSA (A)  Final   Report Status 12/15/2016 FINAL  Final   Organism ID, Bacteria PSEUDOMONAS AERUGINOSA (A)  Final      Susceptibility   Pseudomonas aeruginosa - MIC*    CEFTAZIDIME 4 SENSITIVE Sensitive     CIPROFLOXACIN <=0.25 SENSITIVE Sensitive     GENTAMICIN <=1 SENSITIVE Sensitive     IMIPENEM <=0.25 SENSITIVE Sensitive     PIP/TAZO 8 SENSITIVE Sensitive     CEFEPIME 2 SENSITIVE Sensitive     * 10,000 COLONIES/mL PSEUDOMONAS AERUGINOSA  MRSA PCR Screening     Status: None   Collection Time: 12/12/16  9:39 PM  Result Value Ref Range  Status   MRSA by PCR NEGATIVE NEGATIVE Final    Comment:        The GeneXpert MRSA Assay (FDA approved for NASAL specimens only), is one component of a comprehensive MRSA colonization surveillance program. It is not intended to diagnose MRSA infection nor to guide or monitor treatment for MRSA infections.          Radiology Studies: No results found.      Scheduled Meds: . escitalopram  5 mg Oral QHS  . mouth rinse  15 mL Mouth Rinse BID  . metoprolol tartrate  37.5 mg Oral BID  . pantoprazole  40 mg Oral Daily  . potassium chloride  40 mEq Oral BID  . predniSONE  30 mg Oral Q breakfast  . saccharomyces boulardii  250 mg Oral BID  . warfarin  2.5 mg Oral Once per day on Sun Tue Wed Thu Sat  . [START ON 12/16/2016] warfarin  5 mg Oral Once per day on Mon Fri  . Warfarin - Pharmacist Dosing Inpatient   Does not apply q1800   Continuous Infusions: . piperacillin-tazobactam (ZOSYN)  IV 3.375 g (12/15/16 1413)     LOS: 3 days    Red[place] Cloe Tanna Furry, MD [facility] Pager [phone]  If 7PM-7AM, please contact night-coverage [email or web address] Password [email or web address] 12/15/2016, 2:29 PM

## 2016-12-15 NOTE — Progress Notes (Signed)
Transferred to [place] [facility] by bed, stable, report given to RN, belongings with daughter.

## 2016-12-15 NOTE — Progress Notes (Signed)
Pt. Requesting immodium for loose stool. Denies cramping or abdominal pain. On call for IMTS paged to make aware. RN awaiting orders. Zakaree Mcclenahan, Katherine Roan

## 2016-12-15 NOTE — Progress Notes (Signed)
Md notified per pt's request for medication for loose stools.   Had 2 tonight.  New orders received.  Will continue to monitor Richard Bradley T

## 2016-12-15 NOTE — Progress Notes (Signed)
ANTICOAGULATION CONSULT NOTE - Follow Up Consult  Pharmacy Consult for Coumadin Indication: atrial fibrillation  Allergies  Allergen Reactions  . Tape Other (See Comments)    PATIENT'S SKIN IS VERY THIN AND WILL TEAR AND BRUISE VERY EASILY!! Paper take is ok    Patient Measurements: Height: 6\' 1"  (185.4 cm) Weight: 169 lb 11.2 oz (77 kg) IBW/kg (Calculated) : 79.9  Vital Signs: Temp: 98.1 F (36.7 C) (05/20 1223) Temp Source: Oral (05/20 1223) BP: 95/79 (05/20 1223) Pulse Rate: 102 (05/20 1223)  Labs:  Recent Labs  12/12/16 1556  12/13/16 0249 12/14/16 0359 12/15/16 0536  HGB 12.2*  --  10.8* 10.5*  --   HCT 38.5*  --  34.3* 33.8*  --   PLT 162  --  140* 153  --   LABPROT  --   < > 28.9* 28.9* 29.5*  INR  --   < > 2.67 2.67 2.73  CREATININE 1.00  --  0.79 0.86 0.83  < > = values in this interval not displayed.  Estimated Creatinine Clearance: 67 mL/min (by C-G formula based on SCr of 0.83 mg/dL).  Assessment: 89yom continues on coumadin for afib. INR therapeutic at 2.73, will need to watch with antibiotics.  PTA dose: 2.5mg  daily except 5mg  on Mon/Fri  Goal of Therapy:  INR 2-3 Monitor platelets by anticoagulation protocol: Yes   Plan:  1) Continue home regimen 2) Daily INR  Hildred Laser, Pharm D 12/15/2016 12:38 PM

## 2016-12-15 NOTE — Progress Notes (Signed)
OT Cancellation Note  Patient Details Name: Richard Bradley MRN: [id number] DOB: August 14, 1925   Cancelled Treatment:    Reason Eval/Treat Not Completed: OT screened, no needs identified, will sign off. Spoke with pt and daughter regarding OT needs; pt has an aide that assists with ADL 6 days/week, pt working on transfers with PT currently. Pt mainly only transfers to [facility] and w/c at home and plans to continue this until he is able to weight bear on LLE. No equipment or acute OT needs identified; will screen and sign off at this time. Please re-consult if needs change. Thank you for this referral.  Binnie Kand M.S., OTR/L Pager: [phone]  12/15/2016, 2:53 PM

## 2016-12-16 ENCOUNTER — Telehealth (INDEPENDENT_AMBULATORY_CARE_PROVIDER_SITE_OTHER): Payer: Self-pay | Admitting: Orthopedic Surgery

## 2016-12-16 DIAGNOSIS — I4891 Unspecified atrial fibrillation: Secondary | ICD-10-CM

## 2016-12-16 DIAGNOSIS — R4182 Altered mental status, unspecified: Secondary | ICD-10-CM

## 2016-12-16 DIAGNOSIS — T8131XD Disruption of external operation (surgical) wound, not elsewhere classified, subsequent encounter: Secondary | ICD-10-CM

## 2016-12-16 DIAGNOSIS — A419 Sepsis, unspecified organism: Secondary | ICD-10-CM

## 2016-12-16 DIAGNOSIS — B965 Pseudomonas (aeruginosa) (mallei) (pseudomallei) as the cause of diseases classified elsewhere: Secondary | ICD-10-CM | POA: Diagnosis present

## 2016-12-16 DIAGNOSIS — N390 Urinary tract infection, site not specified: Secondary | ICD-10-CM

## 2016-12-16 DIAGNOSIS — I10 Essential (primary) hypertension: Secondary | ICD-10-CM

## 2016-12-16 DIAGNOSIS — E876 Hypokalemia: Secondary | ICD-10-CM

## 2016-12-16 LAB — BASIC METABOLIC PANEL
ANION GAP: 10 (ref 5–15)
BUN: 14 mg/dL (ref 6–20)
CO2: 21 mmol/L — ABNORMAL LOW (ref 22–32)
Calcium: 8.4 mg/dL — ABNORMAL LOW (ref 8.9–10.3)
Chloride: 107 mmol/L (ref 101–111)
Creatinine, Ser: 0.89 mg/dL (ref 0.61–1.24)
Glucose, Bld: 128 mg/dL — ABNORMAL HIGH (ref 65–99)
POTASSIUM: 3.7 mmol/L (ref 3.5–5.1)
SODIUM: 138 mmol/L (ref 135–145)

## 2016-12-16 LAB — C DIFFICILE QUICK SCREEN W PCR REFLEX
C Diff antigen: POSITIVE — AB
C Diff toxin: NEGATIVE

## 2016-12-16 LAB — PROTIME-INR
INR: 2.96
Prothrombin Time: 31.5 seconds — ABNORMAL HIGH (ref 11.4–15.2)

## 2016-12-16 LAB — CLOSTRIDIUM DIFFICILE BY PCR: Toxigenic C. Difficile by PCR: POSITIVE — AB

## 2016-12-16 MED ORDER — CIPROFLOXACIN HCL 500 MG PO TABS: 500.0000 mg | ORAL_TABLET | Freq: Two times a day (BID) | ORAL | 0 refills | Status: AC

## 2016-12-16 MED ORDER — SACCHAROMYCES BOULARDII 250 MG PO CAPS
250.0000 mg | ORAL_CAPSULE | Freq: Two times a day (BID) | ORAL | 0 refills | Status: DC
Start: 2016-12-16 — End: 2017-02-04

## 2016-12-16 MED ORDER — METOPROLOL TARTRATE 25 MG PO TABS
37.5000 mg | ORAL_TABLET | Freq: Two times a day (BID) | ORAL | 3 refills | Status: DC
Start: 2016-12-16 — End: 2017-02-04

## 2016-12-16 MED ORDER — WARFARIN SODIUM 5 MG PO TABS: 5.0000 mg | ORAL_TABLET | ORAL | Status: DC

## 2016-12-16 MED ORDER — WARFARIN SODIUM 2.5 MG PO TABS: 2.5000 mg | ORAL_TABLET | Freq: Once | ORAL | Status: DC

## 2016-12-16 MED ORDER — HYDROCODONE-ACETAMINOPHEN 10-325 MG PO TABS: 1.0000 | ORAL_TABLET | Freq: Four times a day (QID) | ORAL | 0 refills | Status: AC | PRN

## 2016-12-16 NOTE — Progress Notes (Signed)
ANTICOAGULATION CONSULT NOTE - Follow Up Consult  Pharmacy Consult for Coumadin Indication: atrial fibrillation  Allergies  Allergen Reactions  . Tape Other (See Comments)    PATIENT'S SKIN IS VERY THIN AND WILL TEAR AND BRUISE VERY EASILY!! Paper take is ok    Patient Measurements: Height: 6\' 1"  (185.4 cm) Weight: 166 lb 8 oz (75.5 kg) IBW/kg (Calculated) : 79.9  Vital Signs: Temp: 97.9 F (36.6 C) (05/21 0712) Temp Source: Oral (05/21 0712) BP: 118/62 (05/21 0712) Pulse Rate: 99 (05/21 0712)  Labs:  Recent Labs  12/14/16 0359 12/15/16 0536 12/16/16 0451  HGB 10.5*  --   --   HCT 33.8*  --   --   PLT 153  --   --   LABPROT 28.9* 29.5* 31.5*  INR 2.67 2.73 2.96  CREATININE 0.86 0.83 0.89    Estimated Creatinine Clearance: 61.3 mL/min (by C-G formula based on SCr of 0.89 mg/dL).  Assessment: 89yom continues on coumadin for afib. INR therapeutic at 2.96, but trended up from yesterday. Will need to watch with antibiotics. Will reduce scheduled dose today given rise in INR and near upper end of therapeutic goal.   PTA dose: 2.5mg  daily except 5mg  on Mon/Fri  Goal of Therapy:  INR 2-3 Monitor platelets by anticoagulation protocol: Yes   Plan:  1) Warfarin 2.5mg  x1 2) Daily INR 3) Monitor for s/sx of bleeding  Georga Bora, PharmD Clinical Pharmacist 12/16/2016 8:16 AM

## 2016-12-16 NOTE — Discharge Summary (Signed)
Physician Discharge Summary  Richard Bradley ZOX:[id number] DOB: Jul 17, 1926 DOA: 12/12/2016  PCP: Josetta Huddle, MD  Admit date: 12/12/2016 Discharge date: 12/16/2016  Admitted From: Home Disposition:  Home  Recommendations for Outpatient Follow-up:  1. Patient has appointment with Dr. Sharol Given tomorrow ( 12/17/2016). Dry daily dressing over the left foot. 2. Patient will complete a 10 day course of antibiotics for Pseudomonas UTI (discharged on oral ciprofloxacin). Stop date of antibiotic is 12/23/2016. 3. Follow-up with PCP in 1 week.  Home Health: Has home health RN. Lives with her daughter who is a physical therapist. Equipment/Devices: None  Discharge Condition: Fair CODE STATUS: Partial code (no intubation, no CPR or defibrillator use; only ACLS medication if needed) Diet recommendation: Heart Healthy    Discharge Diagnoses:  Principal Problem:   Sepsis ([place])   Active Problems:   Atrial fibrillation with RVR ([place])   Pseudomonas urinary tract infection   Chronic atrial fibrillation (HCC)   Myasthenia gravis (HCC)   Hypokalemia   Benign essential HTN   Wound dehiscence, surgical   Chronic systolic CHF (congestive heart failure) ([place])  Brief narrative/history of present illness Please refer to admission H&P for details, in brief, 83 year old male with history of myasthenia gravis on chronic prednisone, A. fib on Coumadin, chronic systolic CHF with EF of 81%, on chronic home O2 as needed and severe peripheral artery disease requiring transmetatarsal amputation in February this year followed by sepsis and Pseudomonas bacteremia. He was treated with IV antibiotics for 2 weeks. Repeat cultures grew Candida albicans and was treated with fluconazole for 6 weeks. Patient brought to the ED by his daughter for fever and lethargy. Recently he had dehisence  of his foot wound and underwent outpatient revision of his amputation on the day prior to admission. On the morning of admission his  daughter found him having dry heaving. This was followed by fever of 102F and lethargic. Next line in the ED patient was septic with fever of 101F, tachycardic at 118, tachypneic and low blood pressure of 91/72 mmHg. He had WBC of 14.9 K with positive UA. Patient given gentle IV hydration and admitted to stepdown unit for sepsis and possible right lower extremity cellulitis.  Hospital course Sepsis secondary to Pseudomonas UTI and possible right lower extremity cellulitis. Pansensitive Pseudomonas on urine culture. Patient receiving empiric Zosyn, narrowed to oral ciprofloxacin to complete a total 10 day course of antibiotic. Blood cultures have been negative. I do not see any cellulitis of the right lower leg on my exam today. Ciprofloxacin should cover for any possible cellulitis.  Recent transmetatarsal amputation of the foot. Wound VAC has been removed. Orthopedic and wound care consult appreciated. Does not appear infected. Continue dry dressing at home. Has appointment to see Dr. Sharol Given tomorrow. Pain control with when necessary Vicodin.   Atrial fibrillation with RVR Possibly due to sepsis. Metoprolol dose increased to 37.5 mg twice a day. Given his soft blood pressure would hold off on going up on the dose further. Continue Coumadin. (INR is therapeutic). Heart rate improved on the monitor.  Acute metabolic encephalopathy Secondary to sepsis. Now resolved.   Chronic systolic CHF. Euvolemic. Lasix was held yesterday for soft blood pressure. Will resume upon discharge. Follow-up with Dr. Tamala Julian as outpatient.  History of myasthenia gravis   stable. Continue prednisone. On chronic PPI.  hypokalemia Replenished.  Diarrhea with C. difficile antigen positive Had few episodes of diarrhea yesterday. C. difficile toxin negative. No further episode. I have prescribed florastor while he is  on antibiotic at home.   Patient has a nurse at home who comes in daily and can provide dressing  and help with medications. Patient lives with her daughter who is a physical therapist and does not need home health PT.  Procedure: None  Consults: Orthopedic  Family medication: Daughter at bedside  Disposition: Home   Discharge Instructions   Allergies as of 12/16/2016      Reactions   Tape Other (See Comments)   PATIENT'S SKIN IS VERY THIN AND WILL TEAR AND BRUISE VERY EASILY!! Paper take is ok      Medication List    TAKE these medications   ciprofloxacin 500 MG tablet Commonly known as:  CIPRO Take 1 tablet (500 mg total) by mouth 2 (two) times daily.   escitalopram 5 MG tablet Commonly known as:  LEXAPRO Take 5 mg by mouth at bedtime.   FIBER PO Take 1 tablet by mouth 3 (three) times daily.   fluticasone 50 MCG/ACT nasal spray Commonly known as:  FLONASE Place 2 sprays into both nostrils 2 (two) times daily.   furosemide 40 MG tablet Commonly known as:  LASIX Take 1 tablet (40 mg total) by mouth 2 (two) times daily.   guaiFENesin 600 MG 12 hr tablet Commonly known as:  MUCINEX Take 600 mg by mouth 2 (two) times daily as needed for to loosen phlegm.   HYDROcodone-acetaminophen 10-325 MG tablet Commonly known as:  NORCO Take 1 tablet by mouth every 6 (six) hours as needed (for pain).   metoprolol tartrate 25 MG tablet Commonly known as:  LOPRESSOR Take 1.5 tablets (37.5 mg total) by mouth 2 (two) times daily. What changed:  how much to take   ondansetron 4 MG tablet Commonly known as:  ZOFRAN Take 1 tablet (4 mg total) by mouth every 8 (eight) hours as needed for nausea or vomiting.   pantoprazole 40 MG tablet Commonly known as:  PROTONIX Take 1 tablet (40 mg total) by mouth daily.   polyethylene glycol packet Commonly known as:  MIRALAX / GLYCOLAX Take 17 g by mouth daily as needed for moderate constipation. For constipation What changed:  reasons to take this  additional instructions   potassium chloride SA 20 MEQ tablet Commonly known  as:  K-DUR,KLOR-CON Take 1 tablet (20 mEq total) by mouth daily. What changed:  when to take this   predniSONE 10 MG tablet Commonly known as:  DELTASONE Take 2 tablets (20 mg total) by mouth daily with breakfast. Take 40mg  for 1 more day, then continue 20mg  daily What changed:  additional instructions   saccharomyces boulardii 250 MG capsule Commonly known as:  FLORASTOR Take 1 capsule (250 mg total) by mouth 2 (two) times daily.   vitamin B-12 1000 MCG tablet Commonly known as:  CYANOCOBALAMIN Take 1,000 mcg by mouth daily with lunch.   Vitamin D 2000 units Caps Take 2,000 Units by mouth daily.   warfarin 5 MG tablet Commonly known as:  COUMADIN Take as directed by coumadin clinic What changed:  how much to take  how to take this  when to take this  additional instructions      Follow-up Information    Josetta Huddle, MD. Schedule an appointment as soon as possible for a visit in 1 week(s).   Specialty:  Internal Medicine Contact information: [address] [place] [address] [phone]        Newt Minion, MD Follow up on 12/17/2016.   Specialty:  Orthopedic Surgery Contact information: [address] [place] [address] [phone]          Allergies  Allergen Reactions  . Tape Other (See Comments)    PATIENT'S SKIN IS VERY THIN AND WILL TEAR AND BRUISE VERY EASILY!! Paper take is ok      Procedures/Studies: Dg Chest Port 1 View  Result Date: 12/12/2016 CLINICAL DATA:  Lethargy and confusion EXAM: PORTABLE CHEST 1 VIEW COMPARISON:  September 29, 2016 FINDINGS: There is no edema or consolidation. There is extensive calcification in the hemithoraces consistent with calcified pleural plaques. This appearance is stable. There is stable cardiomegaly with pulmonary vascular within normal limits. There is aortic atherosclerosis. No evident adenopathy. Bones are osteoporotic. IMPRESSION: Stable cardiomegaly.  There is aortic  atherosclerosis. Calcified pleural plaques in the upper hemithoraces remain stable. There is no edema or consolidation. Electronically Signed   By: Lowella Grip III M.D.   On: 12/12/2016 16:52      Subjective:  reports some cough. No further diarrhea.  Discharge Exam: Vitals:   12/15/16 2123 12/16/16 0712  BP: 121/72 118/62  Pulse: (!) 107 99  Resp:  20  Temp: 98.6 F (37 C) 97.9 F (36.6 C)   Vitals:   12/15/16 1558 12/15/16 1606 12/15/16 2123 12/16/16 0712  BP:  123/80 121/72 118/62  Pulse:  (!) 119 (!) 107 99  Resp:  (!) 22  20  Temp:  98.4 F (36.9 C) 98.6 F (37 C) 97.9 F (36.6 C)  TempSrc:  Oral Oral Oral  SpO2:  90% 93% 94%  Weight: 75.5 kg (166 lb 8 oz)     Height: 6\' 1"  (1.854 m)       General:Elderly male not in distress HEENT: Moist mucosa, supple neck Chest: Clear to auscultation bilaterally, no added sounds CVS: S1 and S2 irregular, no murmurs rub or gallop GI: Soft, nondistended, nontender, bowel sounds present Musculoskeletal: Clean dressing over amputation wound site. Wound appears clean and uninfected.  CNS: Alert and oriented.    The results of significant diagnostics from this hospitalization (including imaging, microbiology, ancillary and laboratory) are listed below for reference.     Microbiology: Recent Results (from the past 240 hour(s))  Blood Culture (routine x 2)     Status: None (Preliminary result)   Collection Time: 12/12/16  4:45 PM  Result Value Ref Range Status   Specimen Description BLOOD RIGHT ANTECUBITAL  Final   Special Requests   Final    BOTTLES DRAWN AEROBIC AND ANAEROBIC Blood Culture adequate volume   Culture NO GROWTH 4 DAYS  Final   Report Status PENDING  Incomplete  Blood Culture (routine x 2)     Status: None (Preliminary result)   Collection Time: 12/12/16  4:56 PM  Result Value Ref Range Status   Specimen Description BLOOD RIGHT ANTECUBITAL  Final   Special Requests   Final    BOTTLES DRAWN AEROBIC AND  ANAEROBIC Blood Culture adequate volume   Culture NO GROWTH 4 DAYS  Final   Report Status PENDING  Incomplete  Culture, Urine     Status: Abnormal   Collection Time: 12/12/16  6:18 PM  Result Value Ref Range Status   Specimen Description URINE, RANDOM  Final   Special Requests NONE  Final   Culture 10,000 COLONIES/mL PSEUDOMONAS AERUGINOSA (A)  Final   Report Status 12/15/2016 FINAL  Final   Organism ID, Bacteria PSEUDOMONAS AERUGINOSA (A)  Final      Susceptibility   Pseudomonas aeruginosa -  MIC*    CEFTAZIDIME 4 SENSITIVE Sensitive     CIPROFLOXACIN <=0.25 SENSITIVE Sensitive     GENTAMICIN <=1 SENSITIVE Sensitive     IMIPENEM <=0.25 SENSITIVE Sensitive     PIP/TAZO 8 SENSITIVE Sensitive     CEFEPIME 2 SENSITIVE Sensitive     * 10,000 COLONIES/mL PSEUDOMONAS AERUGINOSA  MRSA PCR Screening     Status: None   Collection Time: 12/12/16  9:39 PM  Result Value Ref Range Status   MRSA by PCR NEGATIVE NEGATIVE Final    Comment:        The GeneXpert MRSA Assay (FDA approved for NASAL specimens only), is one component of a comprehensive MRSA colonization surveillance program. It is not intended to diagnose MRSA infection nor to guide or monitor treatment for MRSA infections.   C difficile quick scan w PCR reflex     Status: Abnormal   Collection Time: 12/15/16  9:52 PM  Result Value Ref Range Status   C Diff antigen POSITIVE (A) NEGATIVE Final   C Diff toxin NEGATIVE NEGATIVE Final   C Diff interpretation Results are indeterminate. See PCR results.  Final  Clostridium Difficile by PCR     Status: Abnormal   Collection Time: 12/15/16  9:52 PM  Result Value Ref Range Status   Toxigenic C Difficile by pcr POSITIVE (A) NEGATIVE Final    Comment: Positive for toxigenic C. difficile with little to no toxin production. Only treat if clinical presentation suggests symptomatic illness.     Labs: BNP (last 3 results)  Recent Labs  09/26/16 1105  BNP 960.4*   Basic Metabolic  Panel:  Recent Labs Lab 12/12/16 1556 12/13/16 0249 12/14/16 0359 12/15/16 0536 12/15/16 0801 12/16/16 0451  NA 137 139 141 140  --  138  K 3.5 3.1* 3.3* 3.4*  --  3.7  CL 103 108 109 105  --  107  CO2 26 22 24 26   --  21*  GLUCOSE 131* 86 109* 102*  --  128*  BUN 16 13 11 10   --  14  CREATININE 1.00 0.79 0.86 0.83  --  0.89  CALCIUM 8.3* 7.8* 8.2* 8.1*  --  8.4*  MG  --   --   --   --  1.9  --    Liver Function Tests:  Recent Labs Lab 12/12/16 1556 12/12/16 2233 12/13/16 0249 12/14/16 0359  AST 36  --  24 26  ALT 14*  --  12* 13*  ALKPHOS 57  --  49 46  BILITOT 1.7* 1.6* 1.7* 1.2  PROT 5.5*  --  4.8* 5.3*  ALBUMIN 3.3*  --  2.7* 2.7*   No results for input(s): LIPASE, AMYLASE in the last 168 hours. No results for input(s): AMMONIA in the last 168 hours. CBC:  Recent Labs Lab 12/11/16 0746 12/12/16 1556 12/13/16 0249 12/14/16 0359  WBC 10.5 14.9* 10.8* 10.3  NEUTROABS  --  13.9*  --   --   HGB 11.9* 12.2* 10.8* 10.5*  HCT 37.8* 38.5* 34.3* 33.8*  MCV 98.4 98.0 98.6 98.3  PLT 198 162 140* 153   Cardiac Enzymes: No results for input(s): CKTOTAL, CKMB, CKMBINDEX, TROPONINI in the last 168 hours. BNP: Invalid input(s): POCBNP CBG: No results for input(s): GLUCAP in the last 168 hours. D-Dimer No results for input(s): DDIMER in the last 72 hours. Hgb A1c No results for input(s): HGBA1C in the last 72 hours. Lipid Profile No results for input(s): CHOL, HDL, LDLCALC, TRIG, CHOLHDL,  LDLDIRECT in the last 72 hours. Thyroid function studies No results for input(s): TSH, T4TOTAL, T3FREE, THYROIDAB in the last 72 hours.  Invalid input(s): FREET3 Anemia work up No results for input(s): VITAMINB12, FOLATE, FERRITIN, TIBC, IRON, RETICCTPCT in the last 72 hours. Urinalysis    Component Value Date/Time   COLORURINE YELLOW 12/12/2016 1818   APPEARANCEUR HAZY (A) 12/12/2016 1818   LABSPEC 1.015 12/12/2016 1818   PHURINE 6.0 12/12/2016 1818   GLUCOSEU NEGATIVE  12/12/2016 1818   HGBUR LARGE (A) 12/12/2016 1818   [place] 12/12/2016 [place] 12/12/2016 1818   PROTEINUR 30 (A) 12/12/2016 1818   NITRITE NEGATIVE 12/12/2016 1818   LEUKOCYTESUR NEGATIVE 12/12/2016 1818   Sepsis Labs Invalid input(s): PROCALCITONIN,  WBC,  LACTICIDVEN Microbiology Recent Results (from the past 240 hour(s))  Blood Culture (routine x 2)     Status: None (Preliminary result)   Collection Time: 12/12/16  4:45 PM  Result Value Ref Range Status   Specimen Description BLOOD RIGHT ANTECUBITAL  Final   Special Requests   Final    BOTTLES DRAWN AEROBIC AND ANAEROBIC Blood Culture adequate volume   Culture NO GROWTH 4 DAYS  Final   Report Status PENDING  Incomplete  Blood Culture (routine x 2)     Status: None (Preliminary result)   Collection Time: 12/12/16  4:56 PM  Result Value Ref Range Status   Specimen Description BLOOD RIGHT ANTECUBITAL  Final   Special Requests   Final    BOTTLES DRAWN AEROBIC AND ANAEROBIC Blood Culture adequate volume   Culture NO GROWTH 4 DAYS  Final   Report Status PENDING  Incomplete  Culture, Urine     Status: Abnormal   Collection Time: 12/12/16  6:18 PM  Result Value Ref Range Status   Specimen Description URINE, RANDOM  Final   Special Requests NONE  Final   Culture 10,000 COLONIES/mL PSEUDOMONAS AERUGINOSA (A)  Final   Report Status 12/15/2016 FINAL  Final   Organism ID, Bacteria PSEUDOMONAS AERUGINOSA (A)  Final      Susceptibility   Pseudomonas aeruginosa - MIC*    CEFTAZIDIME 4 SENSITIVE Sensitive     CIPROFLOXACIN <=0.25 SENSITIVE Sensitive     GENTAMICIN <=1 SENSITIVE Sensitive     IMIPENEM <=0.25 SENSITIVE Sensitive     PIP/TAZO 8 SENSITIVE Sensitive     CEFEPIME 2 SENSITIVE Sensitive     * 10,000 COLONIES/mL PSEUDOMONAS AERUGINOSA  MRSA PCR Screening     Status: None   Collection Time: 12/12/16  9:39 PM  Result Value Ref Range Status   MRSA by PCR NEGATIVE NEGATIVE Final    Comment:         The GeneXpert MRSA Assay (FDA approved for NASAL specimens only), is one component of a comprehensive MRSA colonization surveillance program. It is not intended to diagnose MRSA infection nor to guide or monitor treatment for MRSA infections.   C difficile quick scan w PCR reflex     Status: Abnormal   Collection Time: 12/15/16  9:52 PM  Result Value Ref Range Status   C Diff antigen POSITIVE (A) NEGATIVE Final   C Diff toxin NEGATIVE NEGATIVE Final   C Diff interpretation Results are indeterminate. See PCR results.  Final  Clostridium Difficile by PCR     Status: Abnormal   Collection Time: 12/15/16  9:52 PM  Result Value Ref Range Status   Toxigenic C Difficile by pcr POSITIVE (A) NEGATIVE Final    Comment: Positive for  toxigenic C. difficile with little to no toxin production. Only treat if clinical presentation suggests symptomatic illness.     Time coordinating discharge: Over 30 minutes  SIGNED:   Louellen Molder, MD  [facility] 12/16/2016, 10:29 AM Pager   If 7PM-7AM, please contact night-coverage [email or web address] Password [email or web address]

## 2016-12-16 NOTE — Care Management Note (Signed)
Case Management Note  Patient Details  Name: Richard Bradley MRN: [id number] Date of Birth: 1926-04-10  Subjective/Objective:     CM following for progression and d/c needs.                Action/Plan: 12/16/2016 Met briefly with pt and daughter, per pt and daughter they has no HH or DME needs. HHRN for dressing changes offered and 3:1 with drop arm. Daughter who is a physical therapist states that she will be able to provide these services for her father. No other needs identified.  Expected Discharge Date:  12/16/16               Expected Discharge Plan:  Home/Self Care  In-House Referral:     Discharge planning Services  CM Consult  Post Acute Care Choice:  Durable Medical Equipment, Home Health Choice offered to:  Patient, Adult Children  DME Arranged:  Patient refused services (Per daughter who is a physical therapist, no DME needs .) DME Agency:  NA  HH Arranged:  Patient Refused HH (Per pt and daughter no HH needs. She can provide PT and dressing changes.) HH Agency:  NA  Status of Service:  Completed, signed off  If discussed at [place] of Stay Meetings, dates discussed:    Additional Comments:  Adron Bene, RN 12/16/2016, 10:38 AM

## 2016-12-16 NOTE — Progress Notes (Signed)
Patient's daughter given discharge instructions per patient request.  All questions answered.  Patient discharged via wheelchair with all belongings.

## 2016-12-16 NOTE — Telephone Encounter (Signed)
Patient's daughter Maudie Mercury ) called advised patient just got out of the hospital today and want to know if the appointment for 12/17/16 can be canceled because patient was send by Dr.Whitfield on Saturday and by the hospitalist today before he was released.  Patient has an appointment on 12/26/16. The number to contact Maudie Mercury is [phone]

## 2016-12-16 NOTE — Telephone Encounter (Signed)
I called and spoke with care giver daughter had gone for the evening. Advised we can cx appt. He will come into the office on 12/26/16

## 2016-12-16 NOTE — Care Management Important Message (Signed)
Important Message  Patient Details  Name: Richard Bradley MRN: [id number] Date of Birth: 08-20-1925   Medicare Important Message Given:  Yes    [name], [name], RN 12/16/2016, 11:44 AM

## 2016-12-17 ENCOUNTER — Inpatient Hospital Stay (INDEPENDENT_AMBULATORY_CARE_PROVIDER_SITE_OTHER): Payer: Medicare Other | Admitting: Orthopedic Surgery

## 2016-12-17 LAB — CULTURE, BLOOD (ROUTINE X 2)
Culture: NO GROWTH
Culture: NO GROWTH
SPECIAL REQUESTS: ADEQUATE
Special Requests: ADEQUATE

## 2016-12-18 ENCOUNTER — Inpatient Hospital Stay (INDEPENDENT_AMBULATORY_CARE_PROVIDER_SITE_OTHER): Payer: Medicare Other | Admitting: Family

## 2016-12-26 ENCOUNTER — Encounter (INDEPENDENT_AMBULATORY_CARE_PROVIDER_SITE_OTHER): Payer: Self-pay | Admitting: Orthopedic Surgery

## 2016-12-26 ENCOUNTER — Ambulatory Visit (INDEPENDENT_AMBULATORY_CARE_PROVIDER_SITE_OTHER): Payer: Medicare Other | Admitting: Orthopedic Surgery

## 2016-12-26 VITALS — Ht 73.0 in | Wt 166.0 lb

## 2016-12-26 DIAGNOSIS — Z89432 Acquired absence of left foot: Secondary | ICD-10-CM

## 2016-12-26 DIAGNOSIS — T8781 Dehiscence of amputation stump: Secondary | ICD-10-CM

## 2016-12-26 NOTE — Progress Notes (Signed)
Post-Op Visit Note   Patient: Richard Bradley           Date of Birth: 08-10-1925           MRN: [id number] Visit Date: 12/26/2016 PCP: Josetta Huddle, MD  Chief Complaint:  Chief Complaint  Patient presents with  . Left Foot - Routine Post Op    12/11/16 left transmet revision with vac Small amount of bloody drainage patient states he is well     HPI:  The patient is an 83 year old gentleman who presents status post revision of his left transmetatarsal amputation on May 16. The wound VAC was removed at home. He has been doing every other day dry dressing changes with home health aid. They state he has had some bloody drainage through the dressings. Does touch down weight-bear for transfers and to get onto his porch.    Ortho Exam Him the medial aspect of knee amputation is well approximated well-healed. Laterally there is some dehiscence. This is open about 4 mm there is A 2 mm there is beefy tissue in the wound bed. No surrounding erythema no odor no cellulitis. Bloody drainage today.  Visit Diagnoses:  1. S/P transmetatarsal amputation of foot, left ([place])   2. Dehiscence of amputation stump (HCC)     Plan: Resume nitroglycerin patches to the dorsum of the foot. Cleanse the foot daily. Recommended against soaking the foot. Apply dry dressing. Did minimize weightbearing and elevate the foot.  Follow-Up Instructions: Return in about 2 weeks (around 01/09/2017).   Imaging: No results found.  Orders:  No orders of the defined types were placed in this encounter.  No orders of the defined types were placed in this encounter.    PMFS History: Patient Active Problem List   Diagnosis Date Noted  . Pseudomonas urinary tract infection 12/16/2016  . Altered mental status   . Atrial fibrillation with RVR ([place])   . Sepsis ([place]) 12/12/2016  . Chronic systolic CHF (congestive heart failure) ([place]) 12/12/2016  . Wound dehiscence, surgical 10/03/2016  . Acute respiratory distress     . Goals of care, counseling/discussion   . Palliative care by specialist   . Hypokalemia 09/21/2016  . Benign essential HTN 09/21/2016  . Anticoagulated on Coumadin 12/20/2013  . Myasthenia gravis ([place]) 06/27/2011  . Chronic atrial fibrillation [facility])    Past Medical History:  Diagnosis Date  . Atrial fibrillation ([place])   . Cellulitis    left arm  . CHF (congestive heart failure) ([place])   . Dyspnea    oxygen prn  . Dysrhythmia    afib  . GERD (gastroesophageal reflux disease)    ocassional  . History of kidney stones    passed  . Myasthenia gravis   . Osteoarthritis   . Pneumonia    Hstory of  . 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 Prostate cancer ([place]) dx'd 1982   treated with surgery and radiation.  Basal cell cancer- many  . Sepsis ([place])   . Urine incontinence   . Varicose veins of left lower extremity     Family History  Problem Relation Age of Onset  . Heart disease Father   . Heart attack Father   . Hypertension Father   . Heart disease Brother   . Heart disease Paternal Grandfather   . Stroke Mother   . Parkinsonism Sister   . Heart disease Sister   . Dementia Sister     Past Surgical History:  Procedure Laterality Date  .  AMPUTATION Left 03/20/2016   Procedure: Left Great Toe Amputation at Metatarsophalangeal Joint;  Surgeon: Newt Minion, MD;  Location: [place];  Service: Orthopedics;  Laterality: Left;  . AMPUTATION Left 06/11/2016   Procedure: Left 2nd Toe Amputation;  Surgeon: Newt Minion, MD;  Location: [place];  Service: Orthopedics;  Laterality: Left;  . AMPUTATION Left 07/12/2016   Procedure: LEFT 3rd TOE AMPUTATION;  Surgeon: Newt Minion, MD;  Location: [place];  Service: Orthopedics;  Laterality: Left;  . AMPUTATION Left 09/11/2016   Procedure: Left Transmetatarsal Amputation;  Surgeon: Newt Minion, MD;  Location: [place];  Service: Orthopedics;  Laterality: Left;  . appendectomy    . APPENDECTOMY    . COLONOSCOPY    . HERNIA  REPAIR Bilateral    Inguinal  . I&D EXTREMITY Left 10/12/2014   Procedure: IRRIGATION AND DEBRIDEMENT INDEX FINGER;  Surgeon: Iran Planas, MD;  Location: [place];  Service: Orthopedics;  Laterality: Left;  . KYPHOPLASTY  2012 ?   T 12- L1  . LAMINECTOMY  2005 ?   lumbar- 4-5  . PROSTATE SURGERY    . ROTATOR CUFF REPAIR Right   . STUMP REVISION Left 12/11/2016   Procedure: Revision Left Transmetatarsal Amputation, Apply Wound VAC;  Surgeon: Newt Minion, MD;  Location: [place];  Service: Orthopedics;  Laterality: Left;  . TOTAL KNEE ARTHROPLASTY Right    Social History   Occupational History  . Retired [employer]   Social History Main Topics  . Smoking status: Never Smoker  . Smokeless tobacco: Never Used  . Alcohol use 1.2 oz/week    2 Cans of beer per week     Comment: occasional beer  . Drug use: No  . Sexual activity: Not on file     Comment: Widowed

## 2017-01-01 ENCOUNTER — Ambulatory Visit: Payer: Medicare Other | Admitting: Physician Assistant

## 2017-01-07 ENCOUNTER — Ambulatory Visit (INDEPENDENT_AMBULATORY_CARE_PROVIDER_SITE_OTHER): Payer: Medicare Other | Admitting: Physician Assistant

## 2017-01-07 ENCOUNTER — Encounter: Payer: Self-pay | Admitting: Physician Assistant

## 2017-01-07 ENCOUNTER — Ambulatory Visit (INDEPENDENT_AMBULATORY_CARE_PROVIDER_SITE_OTHER): Payer: Medicare Other | Admitting: *Deleted

## 2017-01-07 VITALS — BP 118/80 | HR 73 | Temp 97.5°F | Resp 18 | Ht 73.0 in | Wt 170.0 lb

## 2017-01-07 DIAGNOSIS — M79604 Pain in right leg: Secondary | ICD-10-CM | POA: Diagnosis not present

## 2017-01-07 DIAGNOSIS — I482 Chronic atrial fibrillation, unspecified: Secondary | ICD-10-CM

## 2017-01-07 DIAGNOSIS — Z5181 Encounter for therapeutic drug level monitoring: Secondary | ICD-10-CM

## 2017-01-07 DIAGNOSIS — Z7901 Long term (current) use of anticoagulants: Secondary | ICD-10-CM | POA: Diagnosis not present

## 2017-01-07 LAB — POCT INR: INR: 4

## 2017-01-07 MED ORDER — CIPROFLOXACIN HCL 500 MG PO TABS: 500.0000 mg | ORAL_TABLET | Freq: Two times a day (BID) | ORAL | 0 refills | Status: DC

## 2017-01-07 NOTE — Patient Instructions (Addendum)
Be sure to tell the coumadin clinic that you were recently on the azithromycin, and are starting back on ciprofloxacin.  Be sure to see Dr. Sharol Given as planned on Thursday.    IF you received an x-ray today, you will receive an invoice from [facility]. Please contact [facility] at [phone] with questions or concerns regarding your invoice.   IF you received labwork today, you will receive an invoice from [facility]. Please contact [facility] at [phone] with questions or concerns regarding your invoice.   Our billing staff will not be able to assist you with questions regarding bills from these companies.  You will be contacted with the lab results as soon as they are available. The fastest way to get your results is to activate your My Chart account. Instructions are located on the last page of this paperwork. If you have not heard from [place] regarding the results in 2 weeks, please contact this office.

## 2017-01-07 NOTE — Progress Notes (Signed)
Patient ID: Richard Bradley, male     DOB: [birth year], 83 y.o.    MRN: [id number]  PCP: Josetta Huddle, MD  Chief Complaint  Patient presents with  . Leg Pain    RIGHT leg possible cellulitis.    Subjective:   This patient is new to me and presents for hospital follow-up. He is accompanied by his nurse, Katharine Look. He has been seen in this office previously, only for acute illness.  This delightful 83 year old gentleman has numerous chronic medical problems, including chronic Afib (on Coumadin), Myasthenia Gravis, chronic CHF. He has a cardiology visit and Coumadin Clinic appointment later today.  On 12/11/2016 he underwent a Revision LEFT transmetatarsal amputation with Dr. Sharol Given. The following day, he became febrile and had dry heaves, and presented to the ED. He was admitted with sepsis due to Pseudomonas UTI and possible RIGHT LE cellulitis. He was discharged on ciprofloxacin, to complete a 10 day course. He followed up with Dr. Sharol Given on 12/26/16. Due to wound dehiscence, he is using NTG patches on the dorsum of the foot, Silvadene dressings, and elevation.  Just completed a course of azithromycin for respiratory infection.  Yesterday when getting undressed for his bath, he related to his nurse that the RIGHT ankle was painful. Upon removing the compression stocking, they noted some redness and swelling of the ankle. It was a little warm to the touch. He denies fever, chills, SOB, nausea/vomiting.   He had previous redness of the upper RIGHT leg, for which he was treated with antibiotics. No previous symptoms in this location.  Review of Systems As above.   Prior to Admission medications   Medication Sig Start Date End Date Taking? Authorizing Provider  Cholecalciferol (VITAMIN D) 2000 units CAPS Take 2,000 Units by mouth daily.    [provider]  escitalopram (LEXAPRO) 5 MG tablet Take 5 mg by mouth at bedtime. 09/15/16   [provider]  FIBER PO Take 1 tablet  by mouth 3 (three) times daily.     [provider]  fluticasone (FLONASE) 50 MCG/ACT nasal spray Place 2 sprays into both nostrils 2 (two) times daily.     [provider]  furosemide (LASIX) 40 MG tablet Take 1 tablet (40 mg total) by mouth 2 (two) times daily. 10/02/16   Rai, Vernelle Emerald, MD  guaiFENesin (MUCINEX) 600 MG 12 hr tablet Take 600 mg by mouth 2 (two) times daily as needed for to loosen phlegm.    [provider]  HYDROcodone-acetaminophen (NORCO) 10-325 MG tablet Take 1 tablet by mouth every 6 (six) hours as needed (for pain). 12/16/16   Dhungel, Flonnie Overman, MD  metoprolol tartrate (LOPRESSOR) 25 MG tablet Take 1.5 tablets (37.5 mg total) by mouth 2 (two) times daily. 12/16/16   Dhungel, Flonnie Overman, MD  ondansetron (ZOFRAN) 4 MG tablet Take 1 tablet (4 mg total) by mouth every 8 (eight) hours as needed for nausea or vomiting. 12/12/16   Suzan Slick, NP  pantoprazole (PROTONIX) 40 MG tablet Take 1 tablet (40 mg total) by mouth daily. 10/02/16   Rai, Vernelle Emerald, MD  polyethylene glycol (MIRALAX / GLYCOLAX) packet Take 17 g by mouth daily as needed for moderate constipation. For constipation Patient taking differently: Take 17 g by mouth daily as needed for mild constipation or moderate constipation.  10/02/16   Rai, Vernelle Emerald, MD  potassium chloride SA (K-DUR,KLOR-CON) 20 MEQ tablet Take 1 tablet (20 mEq total) by mouth daily. Patient taking  differently: Take 20 mEq by mouth daily with lunch.  10/02/16   Rai, Vernelle Emerald, MD  predniSONE (DELTASONE) 10 MG tablet Take 2 tablets (20 mg total) by mouth daily with breakfast. Take 40mg  for 1 more day, then continue 20mg  daily Patient taking differently: Take 20 mg by mouth daily with breakfast.  10/02/16   Rai, Vernelle Emerald, MD  saccharomyces boulardii (FLORASTOR) 250 MG capsule Take 1 capsule (250 mg total) by mouth 2 (two) times daily. 12/16/16   Dhungel, Flonnie Overman, MD  vitamin B-12 (CYANOCOBALAMIN) 1000 MCG tablet Take 1,000 mcg by  mouth daily with lunch.     [provider]  warfarin (COUMADIN) 5 MG tablet Take as directed by coumadin clinic Patient taking differently: Take 2.5-5 mg by mouth See admin instructions. 2.5 mg in the evening on Sun/Tues/Wed/Thurs/Sat and 5 mg on Mon/Fri 07/11/16   Belva Crome, MD     Allergies  Allergen Reactions  . Tape Other (See Comments)    PATIENT'S SKIN IS VERY THIN AND WILL TEAR AND BRUISE VERY EASILY!! Paper take is ok     Patient Active Problem List   Diagnosis Date Noted  . Pseudomonas urinary tract infection 12/16/2016  . Altered mental status   . Atrial fibrillation with RVR ([place])   . Sepsis ([place]) 12/12/2016  . Chronic systolic CHF (congestive heart failure) ([place]) 12/12/2016  . Wound dehiscence, surgical 10/03/2016  . Acute respiratory distress   . Goals of care, counseling/discussion   . Palliative care by specialist   . Hypokalemia 09/21/2016  . Benign essential HTN 09/21/2016  . Anticoagulated on Coumadin 12/20/2013  . Myasthenia gravis ([place]) 06/27/2011  . Chronic atrial fibrillation (HCC)      Family History  Problem Relation Age of Onset  . Heart disease Father   . Heart attack Father   . Hypertension Father   . Heart disease Brother   . Heart disease Paternal Grandfather   . Stroke Mother   . Parkinsonism Sister   . Heart disease Sister   . Dementia Sister      Social History   Social History  . Marital status: Widowed    Spouse name: N/A  . Number of children: 2  . Years of education: 12   Occupational History  . Retired [employer]   Social History Main Topics  . Smoking status: Never Smoker  . Smokeless tobacco: Never Used  . Alcohol use 1.2 oz/week    2 Cans of beer per week     Comment: occasional beer  . Drug use: No  . Sexual activity: Not on file     Comment: Widowed   Other Topics Concern  . Not on file   Social History Narrative   From [place], moving to [place]   Widowed    Right-handed   Caffeine: 1 cup of coffee per day         Objective:  Physical Exam  Constitutional: He is oriented to person, place, and time. He appears well-developed and well-nourished. He is active and cooperative. No distress.  BP 118/80   Pulse 73   Temp 97.5 F (36.4 C) (Oral)   Resp 18   Ht 6\' 1"  (1.854 m)   Wt 170 lb (77.1 kg)   BMI 22.43 kg/m  He is obviously quite tall, sitting in a wheelchair.   HENT:  Head: Normocephalic and atraumatic.  Eyes: Conjunctivae are normal. Right eye exhibits no discharge. Left eye exhibits  no discharge. No scleral icterus.  Neck: Normal range of motion. Neck supple. No thyromegaly present.  Cardiovascular: Normal rate.  An irregularly irregular rhythm present.  Pulses:      Radial pulses are 2+ on the left side.       Dorsalis pedis pulses are 1+ on the right side.       Posterior tibial pulses are 1+ on the right side.  Pulmonary/Chest: Effort normal.  Musculoskeletal:       Right ankle: He exhibits swelling (of the ankle, 1+ pitting posterior to medial malleolus.) and abnormal pulse (difficult to palpate, but per nurse, not new for this patient). Tenderness. No lateral malleolus and no medial malleolus tenderness found. Achilles tendon exhibits no pain.       Right lower leg: Normal.       Left lower leg: Normal.  Dressing and post-op shoe on the LEFT foot not removed. Soft tissues of the anterior RIGHT ankle are tender on palpation. There is erythema anteriorly, without lesions or induration. Very minimal increase in warmth of the erythematous area relative to the surrounding skin.  Lymphadenopathy:    He has no cervical adenopathy.  Neurological: He is alert and oriented to person, place, and time.  Psychiatric: He has a normal mood and affect. His speech is normal and behavior is normal.        Assessment & Plan:  1. Right leg pain Concern for early cellulitis. Discussed risk of C diff colitis with antibiotics,  especially given recent antibiotics. Restart ciprofloxacin. Elevate leg. Follow-up with Dr. Sharol Given in 2 days, as planned. - ciprofloxacin (CIPRO) 500 MG tablet; Take 1 tablet (500 mg total) by mouth 2 (two) times daily.  Dispense: 10 tablet; Refill: 0    Return if symptoms worsen or fail to improve.   Fara Chute, PA-C [facility] [place]

## 2017-01-09 ENCOUNTER — Encounter (INDEPENDENT_AMBULATORY_CARE_PROVIDER_SITE_OTHER): Payer: Self-pay | Admitting: Orthopedic Surgery

## 2017-01-09 ENCOUNTER — Ambulatory Visit (INDEPENDENT_AMBULATORY_CARE_PROVIDER_SITE_OTHER): Payer: Medicare Other | Admitting: Orthopedic Surgery

## 2017-01-09 VITALS — Ht 73.0 in | Wt 170.0 lb

## 2017-01-09 DIAGNOSIS — Z89432 Acquired absence of left foot: Secondary | ICD-10-CM

## 2017-01-09 NOTE — Progress Notes (Signed)
Office Visit Note   Patient: Richard Bradley           Date of Birth: 1926-01-13           MRN: [id number] Visit Date: 01/09/2017              Requested by: Josetta Huddle, MD [address] [place], [place] [address] PCP: Josetta Huddle, MD  Chief Complaint  Patient presents with  . Left Foot - Follow-up      HPI: Patient is status post revision transmetatarsal amputation left foot.  Assessment & Plan: Visit Diagnoses:  1. S/P transmetatarsal amputation of foot, left (HCC)     Plan: Start Dial soap cleansing sutures removed today Silvadene and 4 x 4 plus Ace wrap. Continue to use the nitroglycerin patch change daily  Follow-Up Instructions: Return in about 2 weeks (around 01/23/2017).   Ortho Exam  Patient is alert, oriented, no adenopathy, well-dressed, normal affect, normal respiratory effort. On examination the wound has dehisced laterally. There is good granulation tissue there is no exposed bone or tendon there is no drainage no cellulitis. The medial aspect the wound has healed well.  Imaging: No results found.  Labs: Lab Results  Component Value Date   HGBA1C 5.6 09/26/2011   ESRSEDRATE 7 05/24/2016   ESRSEDRATE 29 (H) 10/12/2014   ESRSEDRATE 8 09/26/2011   CRP 6.4 (H) 05/24/2016   CRP 14.0 (H) 10/12/2014   CRP 0.02 09/26/2011   REPTSTATUS 12/15/2016 FINAL 12/12/2016   GRAMSTAIN  10/12/2014    RARE WBC PRESENT,BOTH PMN AND MONONUCLEAR NO ORGANISMS SEEN Performed at [facility]    GRAMSTAIN  10/12/2014    RARE WBC PRESENT,BOTH PMN AND MONONUCLEAR NO ORGANISMS SEEN Performed at [facility]    CULT 10,000 COLONIES/mL PSEUDOMONAS AERUGINOSA (A) 12/12/2016   LABORGA PSEUDOMONAS AERUGINOSA (A) 12/12/2016    Orders:  No orders of the defined types were placed in this encounter.  No orders of the defined types were placed in this encounter.    Procedures: No procedures performed  Clinical Data: No additional  findings.  ROS:  All other systems negative, except as noted in the HPI. Review of Systems  Objective: Vital Signs: Ht 6\' 1"  (1.854 m)   Wt 170 lb (77.1 kg)   BMI 22.43 kg/m   Specialty Comments:  No specialty comments available.  PMFS History: Patient Active Problem List   Diagnosis Date Noted  . Pseudomonas urinary tract infection 12/16/2016  . Altered mental status   . Atrial fibrillation with RVR ([place])   . Sepsis ([place]) 12/12/2016  . Chronic systolic CHF (congestive heart failure) ([place]) 12/12/2016  . Wound dehiscence, surgical 10/03/2016  . Acute respiratory distress   . Goals of care, counseling/discussion   . Palliative care by specialist   . Hypokalemia 09/21/2016  . Benign essential HTN 09/21/2016  . Anticoagulated on Coumadin 12/20/2013  . Myasthenia gravis ([place]) 06/27/2011  . Chronic atrial fibrillation [facility])    Past Medical History:  Diagnosis Date  . Atrial fibrillation ([place])   . Cellulitis    left arm  . CHF (congestive heart failure) ([place])   . Dyspnea    oxygen prn  . Dysrhythmia    afib  . GERD (gastroesophageal reflux disease)    ocassional  . History of kidney stones    passed  . Myasthenia gravis   . Osteoarthritis   . Pneumonia    Hstory of  . 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  Prostate cancer ([place]) dx'd 1982   treated with surgery and radiation.  Basal cell cancer- many  . Sepsis ([place])   . Urine incontinence   . Varicose veins of left lower extremity     Family History  Problem Relation Age of Onset  . Heart disease Father   . Heart attack Father   . Hypertension Father   . Heart disease Brother   . Heart disease Paternal Grandfather   . Stroke Mother   . Parkinsonism Sister   . Heart disease Sister   . Dementia Sister     Past Surgical History:  Procedure Laterality Date  . AMPUTATION Left 03/20/2016   Procedure: Left Great Toe Amputation at Metatarsophalangeal Joint;  Surgeon: Newt Minion, MD;   Location: [place];  Service: Orthopedics;  Laterality: Left;  . AMPUTATION Left 06/11/2016   Procedure: Left 2nd Toe Amputation;  Surgeon: Newt Minion, MD;  Location: [place];  Service: Orthopedics;  Laterality: Left;  . AMPUTATION Left 07/12/2016   Procedure: LEFT 3rd TOE AMPUTATION;  Surgeon: Newt Minion, MD;  Location: [place];  Service: Orthopedics;  Laterality: Left;  . AMPUTATION Left 09/11/2016   Procedure: Left Transmetatarsal Amputation;  Surgeon: Newt Minion, MD;  Location: [place];  Service: Orthopedics;  Laterality: Left;  . appendectomy    . APPENDECTOMY    . COLONOSCOPY    . HERNIA REPAIR Bilateral    Inguinal  . I&D EXTREMITY Left 10/12/2014   Procedure: IRRIGATION AND DEBRIDEMENT INDEX FINGER;  Surgeon: Iran Planas, MD;  Location: [place];  Service: Orthopedics;  Laterality: Left;  . KYPHOPLASTY  2012 ?   T 12- L1  . LAMINECTOMY  2005 ?   lumbar- 4-5  . PROSTATE SURGERY    . ROTATOR CUFF REPAIR Right   . STUMP REVISION Left 12/11/2016   Procedure: Revision Left Transmetatarsal Amputation, Apply Wound VAC;  Surgeon: Newt Minion, MD;  Location: [place];  Service: Orthopedics;  Laterality: Left;  . TOTAL KNEE ARTHROPLASTY Right    Social History   Occupational History  . Retired [employer]   Social History Main Topics  . Smoking status: Never Smoker  . Smokeless tobacco: Never Used  . Alcohol use 1.2 oz/week    2 Cans of beer per week     Comment: occasional beer  . Drug use: No  . Sexual activity: Not on file     Comment: Widowed

## 2017-01-14 ENCOUNTER — Ambulatory Visit (INDEPENDENT_AMBULATORY_CARE_PROVIDER_SITE_OTHER): Payer: Medicare Other | Admitting: *Deleted

## 2017-01-14 DIAGNOSIS — Z7901 Long term (current) use of anticoagulants: Secondary | ICD-10-CM

## 2017-01-14 DIAGNOSIS — Z5181 Encounter for therapeutic drug level monitoring: Secondary | ICD-10-CM

## 2017-01-14 DIAGNOSIS — I482 Chronic atrial fibrillation, unspecified: Secondary | ICD-10-CM

## 2017-01-14 LAB — POCT INR: INR: 1.5

## 2017-01-21 ENCOUNTER — Encounter (INDEPENDENT_AMBULATORY_CARE_PROVIDER_SITE_OTHER): Payer: Self-pay | Admitting: Orthopedic Surgery

## 2017-01-21 ENCOUNTER — Ambulatory Visit (INDEPENDENT_AMBULATORY_CARE_PROVIDER_SITE_OTHER): Payer: Medicare Other | Admitting: Orthopedic Surgery

## 2017-01-21 VITALS — Ht 73.0 in | Wt 170.0 lb

## 2017-01-21 DIAGNOSIS — T8781 Dehiscence of amputation stump: Secondary | ICD-10-CM

## 2017-01-21 NOTE — Progress Notes (Signed)
Office Visit Note   Patient: Richard Bradley           Date of Birth: [birth year]           MRN: [id number] Visit Date: 01/21/2017              Requested by: Josetta Huddle, MD [address] [place],  [address] PCP: Josetta Huddle, MD  Chief Complaint  Patient presents with  . Left Foot - Routine Post Op    12/11/16 left transmet revision with vac      HPI: Patient about 6 weeks status post revision left transmetatarsal amp rotation with application of wound VAC. Patient is using Silvadene dressing changes Dial soap cleansing and nitroglycerin patch to improve microcirculation.  Assessment & Plan: Visit Diagnoses:  1. Dehiscence of amputation stump (HCC)     Plan: Continue with current wound care follow-up in 2 weeks.  Follow-Up Instructions: Return in about 2 weeks (around 02/04/2017).   Ortho Exam  Patient is alert, oriented, no adenopathy, well-dressed, normal affect, normal respiratory effort. Examination patient has approximately 75% granulation tissue within the wound. There is a slight depth to the lateral wound which is approximately 2 cm x 1 cm and 5 mm deep. This does not probe to bone or tendon there is no tunneling there is no odor no cellulitis no drainage.  Imaging: No results found.  Labs: Lab Results  Component Value Date   HGBA1C 5.6 09/26/2011   ESRSEDRATE 7 05/24/2016   ESRSEDRATE 29 (H) 10/12/2014   ESRSEDRATE 8 09/26/2011   CRP 6.4 (H) 05/24/2016   CRP 14.0 (H) 10/12/2014   CRP 0.02 09/26/2011   REPTSTATUS 12/15/2016 FINAL 12/12/2016   GRAMSTAIN  10/12/2014    RARE WBC PRESENT,BOTH PMN AND MONONUCLEAR NO ORGANISMS SEEN Performed at [facility]    GRAMSTAIN  10/12/2014    RARE WBC PRESENT,BOTH PMN AND MONONUCLEAR NO ORGANISMS SEEN Performed at [facility]    CULT 10,000 COLONIES/mL PSEUDOMONAS AERUGINOSA (A) 12/12/2016   LABORGA PSEUDOMONAS AERUGINOSA (A) 12/12/2016    Orders:  No orders of the  defined types were placed in this encounter.  No orders of the defined types were placed in this encounter.    Procedures: No procedures performed  Clinical Data: No additional findings.  ROS:  All other systems negative, except as noted in the HPI. Review of Systems  Objective: Vital Signs: Ht 6\' 1"  (1.854 m)   Wt 170 lb (77.1 kg)   BMI 22.43 kg/m   Specialty Comments:  No specialty comments available.  PMFS History: Patient Active Problem List   Diagnosis Date Noted  . Pseudomonas urinary tract infection 12/16/2016  . Altered mental status   . Atrial fibrillation with RVR ([place])   . Sepsis ([place]) 12/12/2016  . Chronic systolic CHF (congestive heart failure) ([place]) 12/12/2016  . Wound dehiscence, surgical 10/03/2016  . Acute respiratory distress   . Goals of care, counseling/discussion   . Palliative care by specialist   . Hypokalemia 09/21/2016  . Benign essential HTN 09/21/2016  . Anticoagulated on Coumadin 12/20/2013  . Myasthenia gravis ([place]) 06/27/2011  . Chronic atrial fibrillation [facility])    Past Medical History:  Diagnosis Date  . Atrial fibrillation ([place])   . Cellulitis    left arm  . CHF (congestive heart failure) ([place])   . Dyspnea    oxygen prn  . Dysrhythmia    afib  . GERD (gastroesophageal reflux disease)  ocassional  . History of kidney stones    passed  . Myasthenia gravis   . Osteoarthritis   . Pneumonia    Hstory of  . 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 Prostate cancer ([place]) dx'd 1982   treated with surgery and radiation.  Basal cell cancer- many  . Sepsis ([place])   . Urine incontinence   . Varicose veins of left lower extremity     Family History  Problem Relation Age of Onset  . Heart disease Father   . Heart attack Father   . Hypertension Father   . Heart disease Brother   . Heart disease Paternal Grandfather   . Stroke Mother   . Parkinsonism Sister   . Heart disease Sister   . Dementia Sister       Past Surgical History:  Procedure Laterality Date  . AMPUTATION Left 03/20/2016   Procedure: Left Great Toe Amputation at Metatarsophalangeal Joint;  Surgeon: Newt Minion, MD;  Location: [place];  Service: Orthopedics;  Laterality: Left;  . AMPUTATION Left 06/11/2016   Procedure: Left 2nd Toe Amputation;  Surgeon: Newt Minion, MD;  Location: [place];  Service: Orthopedics;  Laterality: Left;  . AMPUTATION Left 07/12/2016   Procedure: LEFT 3rd TOE AMPUTATION;  Surgeon: Newt Minion, MD;  Location: [place];  Service: Orthopedics;  Laterality: Left;  . AMPUTATION Left 09/11/2016   Procedure: Left Transmetatarsal Amputation;  Surgeon: Newt Minion, MD;  Location: [place];  Service: Orthopedics;  Laterality: Left;  . appendectomy    . APPENDECTOMY    . COLONOSCOPY    . HERNIA REPAIR Bilateral    Inguinal  . I&D EXTREMITY Left 10/12/2014   Procedure: IRRIGATION AND DEBRIDEMENT INDEX FINGER;  Surgeon: Iran Planas, MD;  Location: [place];  Service: Orthopedics;  Laterality: Left;  . KYPHOPLASTY  2012 ?   T 12- L1  . LAMINECTOMY  2005 ?   lumbar- 4-5  . PROSTATE SURGERY    . ROTATOR CUFF REPAIR Right   . STUMP REVISION Left 12/11/2016   Procedure: Revision Left Transmetatarsal Amputation, Apply Wound VAC;  Surgeon: Newt Minion, MD;  Location: [place];  Service: Orthopedics;  Laterality: Left;  . TOTAL KNEE ARTHROPLASTY Right    Social History   Occupational History  . Retired [employer]   Social History Main Topics  . Smoking status: Never Smoker  . Smokeless tobacco: Never Used  . Alcohol use 1.2 oz/week    2 Cans of beer per week     Comment: occasional beer  . Drug use: No  . Sexual activity: Not on file     Comment: Widowed

## 2017-01-23 ENCOUNTER — Ambulatory Visit (INDEPENDENT_AMBULATORY_CARE_PROVIDER_SITE_OTHER): Payer: Medicare Other | Admitting: Orthopedic Surgery

## 2017-01-28 ENCOUNTER — Ambulatory Visit (INDEPENDENT_AMBULATORY_CARE_PROVIDER_SITE_OTHER): Payer: Medicare Other | Admitting: *Deleted

## 2017-01-28 DIAGNOSIS — Z7901 Long term (current) use of anticoagulants: Secondary | ICD-10-CM | POA: Diagnosis not present

## 2017-01-28 DIAGNOSIS — I482 Chronic atrial fibrillation, unspecified: Secondary | ICD-10-CM

## 2017-01-28 DIAGNOSIS — Z5181 Encounter for therapeutic drug level monitoring: Secondary | ICD-10-CM

## 2017-01-28 LAB — POCT INR: INR: 3.1

## 2017-02-02 NOTE — Progress Notes (Signed)
Cardiology Office Note    Date:  02/04/2017   ID:  SHELIA MAGALLON, DOB January 03, 1926, MRN [id number]  PCP:  Josetta Huddle, MD  Cardiologist: Sinclair Grooms, MD   Chief Complaint  Patient presents with  . Congestive Heart Failure  . Atrial Fibrillation    History of Present Illness:  Richard Bradley is a 83 y.o. male with a history of permanent atrial fibrillation/flutter on coumadin for anticoagulation, chronic combined CHF, myasthenia gravis on prednisone, HTN, DM, PAD s/p left TMTamputation, severe MR, chronic dyspnea and pulmonary fibrosis  Back today for clinical follow-up. Since January he has had a tough set of clinical circumstances. He required an amputation of one of his toes on the right foot. He subsequently developed polymicrobial sepsis and had prolonged therapy with Diflucan. On therapy he developed chronic nausea, weight loss, and anorexia. Eventually, Diflucan was discontinued and he began improving. During the hospital stay for sepsis he became volume overloaded. He was discharged from the hospital on twice a day furosemide 40 mg. Today he states there is some weakness and general lassitude. He frequently feels lightheaded and weak. Appetite is improved according to his sitter. There is no lower extremity swelling. He denies orthopnea. He has not had syncope and chest pain.  Past Medical History:  Diagnosis Date  . Atrial fibrillation ([place])   . Cellulitis    left arm  . CHF (congestive heart failure) ([place])   . Dyspnea    oxygen prn  . Dysrhythmia    afib  . GERD (gastroesophageal reflux disease)    ocassional  . History of kidney stones    passed  . Myasthenia gravis   . Osteoarthritis   . Pneumonia    Hstory of  . 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 Prostate cancer ([place]) dx'd 1982   treated with surgery and radiation.  Basal cell cancer- many  . Sepsis ([place])   . Urine incontinence   . Varicose veins of left lower extremity     Past  Surgical History:  Procedure Laterality Date  . AMPUTATION Left 03/20/2016   Procedure: Left Great Toe Amputation at Metatarsophalangeal Joint;  Surgeon: Newt Minion, MD;  Location: [facility];  Service: Orthopedics;  Laterality: Left;  . AMPUTATION Left 06/11/2016   Procedure: Left 2nd Toe Amputation;  Surgeon: Newt Minion, MD;  Location: [facility];  Service: Orthopedics;  Laterality: Left;  . AMPUTATION Left 07/12/2016   Procedure: LEFT 3rd TOE AMPUTATION;  Surgeon: Newt Minion, MD;  Location: [facility];  Service: Orthopedics;  Laterality: Left;  . AMPUTATION Left 09/11/2016   Procedure: Left Transmetatarsal Amputation;  Surgeon: Newt Minion, MD;  Location: [facility];  Service: Orthopedics;  Laterality: Left;  . appendectomy    . APPENDECTOMY    . COLONOSCOPY    . HERNIA REPAIR Bilateral    Inguinal  . I&D EXTREMITY Left 10/12/2014   Procedure: IRRIGATION AND DEBRIDEMENT INDEX FINGER;  Surgeon: Iran Planas, MD;  Location: [facility];  Service: Orthopedics;  Laterality: Left;  . KYPHOPLASTY  2012 ?   T 12- L1  . LAMINECTOMY  2005 ?   lumbar- 4-5  . PROSTATE SURGERY    . ROTATOR CUFF REPAIR Right   . STUMP REVISION Left 12/11/2016   Procedure: Revision Left Transmetatarsal Amputation, Apply Wound VAC;  Surgeon: Newt Minion, MD;  Location: [facility];  Service: Orthopedics;  Laterality: Left;  . TOTAL KNEE ARTHROPLASTY Right     Current  Medications: Outpatient Medications Prior to Visit  Medication Sig Dispense Refill  . Cholecalciferol (VITAMIN D) 2000 units CAPS Take 2,000 Units by mouth daily.    Marland Kitchen escitalopram (LEXAPRO) 5 MG tablet Take 5 mg by mouth at bedtime.  3  . FIBER PO Take 1 tablet by mouth 3 (three) times daily.     . fluticasone (FLONASE) 50 MCG/ACT nasal spray Place 2 sprays into both nostrils 2 (two) times daily.     . furosemide (LASIX) 40 MG tablet Take 1 tablet (40 mg total) by mouth 2 (two) times daily. 60 tablet 3  . guaiFENesin (MUCINEX) 600 MG 12 hr tablet Take 600 mg by mouth  2 (two) times daily as needed for to loosen phlegm.    Marland Kitchen HYDROcodone-acetaminophen (NORCO) 10-325 MG tablet Take 1 tablet by mouth every 6 (six) hours as needed (for pain). 20 tablet 0  . pantoprazole (PROTONIX) 40 MG tablet Take 1 tablet (40 mg total) by mouth daily. 30 tablet 3  . polyethylene glycol (MIRALAX / GLYCOLAX) packet Take 17 g by mouth daily as needed for moderate constipation. For constipation (Patient taking differently: Take 17 g by mouth daily as needed for mild constipation or moderate constipation. ) 30 each 0  . potassium chloride SA (K-DUR,KLOR-CON) 20 MEQ tablet Take 1 tablet (20 mEq total) by mouth daily. (Patient taking differently: Take 20 mEq by mouth daily with lunch. ) 30 tablet 3  . vitamin B-12 (CYANOCOBALAMIN) 1000 MCG tablet Take 1,000 mcg by mouth daily with lunch.     . warfarin (COUMADIN) 5 MG tablet Take as directed by coumadin clinic (Patient taking differently: Take 2.5-5 mg by mouth See admin instructions. 2.5 mg in the evening on Sun/Tues/Wed/Thurs/Sat and 5 mg on Mon/Fri) 30 tablet 3  . ciprofloxacin (CIPRO) 500 MG tablet Take 1 tablet (500 mg total) by mouth 2 (two) times daily. (Patient not taking: Reported on 02/04/2017) 10 tablet 0  . metoprolol tartrate (LOPRESSOR) 25 MG tablet Take 1.5 tablets (37.5 mg total) by mouth 2 (two) times daily. (Patient not taking: Reported on 02/04/2017) 60 tablet 3  . ondansetron (ZOFRAN) 4 MG tablet Take 1 tablet (4 mg total) by mouth every 8 (eight) hours as needed for nausea or vomiting. (Patient not taking: Reported on 02/04/2017) 30 tablet 0  . predniSONE (DELTASONE) 10 MG tablet Take 2 tablets (20 mg total) by mouth daily with breakfast. Take 40mg  for 1 more day, then continue 20mg  daily (Patient not taking: Reported on 02/04/2017)    . saccharomyces boulardii (FLORASTOR) 250 MG capsule Take 1 capsule (250 mg total) by mouth 2 (two) times daily. (Patient not taking: Reported on 02/04/2017) 30 capsule 0   No  facility-administered medications prior to visit.      Allergies:   Tape   Social History   Social History  . Marital status: Widowed    Spouse name: N/A  . Number of children: 2  . Years of education: 12   Occupational History  . Retired [employer]   Social History Main Topics  . Smoking status: Never Smoker  . Smokeless tobacco: Never Used  . Alcohol use 1.2 oz/week    2 Cans of beer per week     Comment: occasional beer  . Drug use: No  . Sexual activity: Not Asked     Comment: Widowed   Other Topics Concern  . None   Social History Narrative   From [place], moving to  [place]   Widowed   Right-handed   Caffeine: 1 cup of coffee per day     Family History:  The patient's family history includes Dementia in his sister; Heart attack in his father; Heart disease in his brother, father, paternal grandfather, and sister; Hypertension in his father; Parkinsonism in his sister; Stroke in his mother.   ROS:   Please see the history of present illness.    Minimal lower extremity swelling and tenderness to stare. Has been less of a problem since discharge from the hospital. Vision disturbance, depression, back pain, easy bruising, balance difficulty, anxiety, constipation, leg pain, and fatigue  All other systems reviewed and are negative.   PHYSICAL EXAM:   VS:  BP 92/70 (BP Location: Right Arm)   Pulse (!) 41   Ht 6\' 3"  (1.905 m)   Wt 171 lb (77.6 kg)   BMI 21.37 kg/m    GEN: Well nourished, well developed, in no acute distress  HEENT: normal  Neck: no JVD, carotid bruits, or masses Cardiac: IIRR; no murmurs, rubs, or gallops,no edema  Respiratory:  clear to auscultation bilaterally, normal work of breathing GI: soft, nontender, nondistended, + BS MS: no deformity or atrophy  Skin: warm and dry, no rash Neuro:  Alert and Oriented x 3, Strength and sensation are intact Psych: euthymic mood, full affect  Wt Readings from Last 3  Encounters:  02/04/17 171 lb (77.6 kg)  01/21/17 170 lb (77.1 kg)  01/09/17 170 lb (77.1 kg)      Studies/Labs Reviewed:   EKG:  EKG  Not performed.  Recent Labs: 09/26/2016: B Natriuretic Peptide 624.0 12/14/2016: ALT 13; Hemoglobin 10.5; Platelets 153 12/15/2016: Magnesium 1.9 12/16/2016: BUN 14; Creatinine, Ser 0.89; Potassium 3.7; Sodium 138   Lipid Panel No results found for: CHOL, TRIG, HDL, CHOLHDL, VLDL, LDLCALC, LDLDIRECT  Additional studies/ records that were reviewed today include:  Echo: 09/24/16 Study Conclusions - Left ventricle: The cavity size was severely dilated. Wall thickness was normal. Systolic function was moderately reduced. The estimated ejection fraction was in the range of 35% to 40%. Diffuse hypokinesis. The study is not technically sufficient to allow evaluation of LV diastolic function. - Aortic valve: Sclerosis without stenosis. There was moderate regurgitation. - Mitral valve: Mildly thickened leaflets . There was severe regurgitation directed posteriorly. - Left atrium: Massively dilated (115 ml/m2). - Right ventricle: The cavity size was mildly dilated. Reduced RV systolic function. Lateral annulus peak S velocity: 7.62 cm/s. - Right atrium: Massively dilated (90 ml/m2). - Tricuspid valve: There was moderate regurgitation. - Pulmonary arteries: PA peak pressure: 52 mm Hg (S). - Inferior vena cava: The vessel was dilated. The respirophasic diameter changes were blunted (<50%), consistent with elevated central venous pressure. - Pericardium, extracardiac: A trivial pericardial effusion was identified posterior to the heart. There was a left pleural effusion. Recommendations: LVEF 35-40%, severely dilated LV with global hypokinesis and normal wall thickness, moderate AI, severe posteriorly directed MR, massive biatrial enlargement, mild RVE with reduced RV systolic function, moderate TR, RVSP 52 mmHg, dilated IVC,  trivial pericardial effusion with left pleural effusion. Findings consistent with dilated cardiomyopathy.     ASSESSMENT:    1. Chronic atrial fibrillation ([place])   2. Benign essential HTN   3. Chronic systolic CHF (congestive heart failure) ([place])   4. Anticoagulated on Coumadin      PLAN:  In order of problems listed above:  1. Controlled rate. No change in management strategy at this time. 2. Relatively low  blood pressure. I'm concerned about volume contraction. We'll check a basic metabolic panel and if there is prerenal azotemia, we will consider decreasing furosemide to 40 mg daily. 3. No clinical evidence of volume overload. Neck veins are flat with the patient sitting at 90. Lungs are clear there is no peripheral edema. Findings are compatible with potential volume contraction. Basic metabolic panel will be obtained. Lasix may be cut back to 40 mg daily depending upon results of blood work. 4. Continue to following Coumadin clinic.   Basic metabolic panel is obtained today to determine if we should make an adjustment in Lasix dose. He is also on Lopressor 25 mg 3 times a day which we should continue.    Medication Adjustments/Labs and Tests Ordered: Current medicines are reviewed at length with the patient today.  Concerns regarding medicines are outlined above.  Medication changes, Labs and Tests ordered today are listed in the Patient Instructions below. There are no Patient Instructions on file for this visit.   Signed, Sinclair Grooms, MD  02/04/2017 12:12 PM    [address], [place], [place]  [address] Phone: [phone]; Fax: [phone]

## 2017-02-04 ENCOUNTER — Encounter: Payer: Self-pay | Admitting: Interventional Cardiology

## 2017-02-04 ENCOUNTER — Ambulatory Visit (INDEPENDENT_AMBULATORY_CARE_PROVIDER_SITE_OTHER): Payer: Medicare Other | Admitting: Interventional Cardiology

## 2017-02-04 ENCOUNTER — Ambulatory Visit (INDEPENDENT_AMBULATORY_CARE_PROVIDER_SITE_OTHER): Payer: Medicare Other | Admitting: Orthopedic Surgery

## 2017-02-04 VITALS — BP 92/70 | HR 41 | Ht 75.0 in | Wt 171.0 lb

## 2017-02-04 DIAGNOSIS — I10 Essential (primary) hypertension: Secondary | ICD-10-CM

## 2017-02-04 DIAGNOSIS — I5022 Chronic systolic (congestive) heart failure: Secondary | ICD-10-CM | POA: Diagnosis not present

## 2017-02-04 DIAGNOSIS — Z5181 Encounter for therapeutic drug level monitoring: Secondary | ICD-10-CM | POA: Diagnosis not present

## 2017-02-04 DIAGNOSIS — Z7901 Long term (current) use of anticoagulants: Secondary | ICD-10-CM

## 2017-02-04 DIAGNOSIS — I482 Chronic atrial fibrillation, unspecified: Secondary | ICD-10-CM

## 2017-02-04 LAB — BASIC METABOLIC PANEL
BUN / CREAT RATIO: 22 (ref 10–24)
BUN: 22 mg/dL (ref 8–27)
CHLORIDE: 98 mmol/L (ref 96–106)
CO2: 25 mmol/L (ref 20–29)
Calcium: 8.9 mg/dL (ref 8.6–10.2)
Creatinine, Ser: 1 mg/dL (ref 0.76–1.27)
GFR calc Af Amer: 77 mL/min/{1.73_m2} (ref >59)
GFR calc non Af Amer: 66 mL/min/{1.73_m2} (ref >59)
GLUCOSE: 138 mg/dL — AB (ref 65–99)
POTASSIUM: 4.5 mmol/L (ref 3.5–5.2)
Sodium: 140 mmol/L (ref 134–144)

## 2017-02-04 NOTE — Patient Instructions (Addendum)
Medication Instructions:  None  Labwork: BMET today  Testing/Procedures: None  Follow-Up: Your physician wants you to follow-up in: 6-9  months with Dr. Tamala Julian.  You will receive a reminder letter in the mail two months in advance. If you don't receive a letter, please call our office to schedule the follow-up appointment.   Any Other Special Instructions Will Be Listed Below (If Applicable).     If you need a refill on your cardiac medications before your next appointment, please call your pharmacy.

## 2017-02-06 ENCOUNTER — Encounter (INDEPENDENT_AMBULATORY_CARE_PROVIDER_SITE_OTHER): Payer: Self-pay | Admitting: Orthopedic Surgery

## 2017-02-06 ENCOUNTER — Ambulatory Visit (INDEPENDENT_AMBULATORY_CARE_PROVIDER_SITE_OTHER): Payer: Medicare Other | Admitting: Family

## 2017-02-06 VITALS — Ht 75.0 in | Wt 171.0 lb

## 2017-02-06 DIAGNOSIS — Z89432 Acquired absence of left foot: Secondary | ICD-10-CM | POA: Insufficient documentation

## 2017-02-06 DIAGNOSIS — T8131XD Disruption of external operation (surgical) wound, not elsewhere classified, subsequent encounter: Secondary | ICD-10-CM

## 2017-02-06 NOTE — Progress Notes (Signed)
Office Visit Note   Patient: Richard Bradley           Date of Birth: 1925/08/20           MRN: [id number] Visit Date: 02/06/2017              Requested by: Josetta Huddle, MD [address] [place], [place] [address] PCP: Josetta Huddle, MD  Chief Complaint  Patient presents with  . Left Foot - Routine Post Op    12/11/16 left transmet amputation       HPI: Patient status post revision left transmetatarsal amputation with application of wound VAC. Patient is using Silvadene dressing changes Dial soap cleansing and nitroglycerin patch to improve microcirculation.  Assessment & Plan: Visit Diagnoses:  1. Postoperative wound dehiscence, subsequent encounter   2. S/P transmetatarsal amputation of foot, left ([place])     Plan: Caregiver requests to push out follow up a little farther. Will call with any concerns. Continue with current wound care follow-up in 4 weeks.  Follow-Up Instructions: Return in about 4 weeks (around 03/06/2017).   Ortho Exam  Patient is alert, oriented, no adenopathy, well-dressed, normal affect, normal respiratory effort. Examination patient has approximately 50% granulation tissue within the wound. There is a slight depth to the lateral wound which is approximately 45 mm x 1 cm and 8 mm deep. This does not probe to bone or tendon there is no tunneling there is no odor no cellulitis no drainage.  Imaging: No results found.  Labs: Lab Results  Component Value Date   HGBA1C 5.6 09/26/2011   ESRSEDRATE 7 05/24/2016   ESRSEDRATE 29 (H) 10/12/2014   ESRSEDRATE 8 09/26/2011   CRP 6.4 (H) 05/24/2016   CRP 14.0 (H) 10/12/2014   CRP 0.02 09/26/2011   REPTSTATUS 12/15/2016 FINAL 12/12/2016   GRAMSTAIN  10/12/2014    RARE WBC PRESENT,BOTH PMN AND MONONUCLEAR NO ORGANISMS SEEN Performed at [facility]    GRAMSTAIN  10/12/2014    RARE WBC PRESENT,BOTH PMN AND MONONUCLEAR NO ORGANISMS SEEN Performed at [facility]    CULT  10,000 COLONIES/mL PSEUDOMONAS AERUGINOSA (A) 12/12/2016   LABORGA PSEUDOMONAS AERUGINOSA (A) 12/12/2016    Orders:  No orders of the defined types were placed in this encounter.  No orders of the defined types were placed in this encounter.    Procedures: No procedures performed  Clinical Data: No additional findings.  ROS:  All other systems negative, except as noted in the HPI. Review of Systems  Constitutional: Negative for chills and fever.  Cardiovascular: Negative for leg swelling.  Skin: Positive for wound.    Objective: Vital Signs: Ht 6\' 3"  (1.905 m)   Wt 171 lb (77.6 kg)   BMI 21.37 kg/m   Specialty Comments:  No specialty comments available.  PMFS History: Patient Active Problem List   Diagnosis Date Noted  . S/P transmetatarsal amputation of foot, left ([place]) 02/06/2017  . Pseudomonas urinary tract infection 12/16/2016  . Altered mental status   . Sepsis ([place]) 12/12/2016  . Chronic systolic CHF (congestive heart failure) ([place]) 12/12/2016  . Wound dehiscence, surgical 10/03/2016  . Acute respiratory distress   . Goals of care, counseling/discussion   . Palliative care by specialist   . Hypokalemia 09/21/2016  . Benign essential HTN 09/21/2016  . Anticoagulated on Coumadin 12/20/2013  . Myasthenia gravis ([place]) 06/27/2011  . Chronic atrial fibrillation [facility])    Past Medical History:  Diagnosis Date  . Atrial fibrillation (  [facility])   . Cellulitis    left arm  . CHF (congestive heart failure) ([place])   . Dyspnea    oxygen prn  . Dysrhythmia    afib  . GERD (gastroesophageal reflux disease)    ocassional  . History of kidney stones    passed  . Myasthenia gravis   . Osteoarthritis   . Pneumonia    Hstory of  . 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 Prostate cancer ([place]) dx'd 1982   treated with surgery and radiation.  Basal cell cancer- many  . Sepsis ([place])   . Urine incontinence   . Varicose veins of left lower extremity       Family History  Problem Relation Age of Onset  . Heart disease Father   . Heart attack Father   . Hypertension Father   . Heart disease Brother   . Heart disease Paternal Grandfather   . Stroke Mother   . Parkinsonism Sister   . Heart disease Sister   . Dementia Sister     Past Surgical History:  Procedure Laterality Date  . AMPUTATION Left 03/20/2016   Procedure: Left Great Toe Amputation at Metatarsophalangeal Joint;  Surgeon: Newt Minion, MD;  Location: [facility];  Service: Orthopedics;  Laterality: Left;  . AMPUTATION Left 06/11/2016   Procedure: Left 2nd Toe Amputation;  Surgeon: Newt Minion, MD;  Location: [facility];  Service: Orthopedics;  Laterality: Left;  . AMPUTATION Left 07/12/2016   Procedure: LEFT 3rd TOE AMPUTATION;  Surgeon: Newt Minion, MD;  Location: [facility];  Service: Orthopedics;  Laterality: Left;  . AMPUTATION Left 09/11/2016   Procedure: Left Transmetatarsal Amputation;  Surgeon: Newt Minion, MD;  Location: [facility];  Service: Orthopedics;  Laterality: Left;  . appendectomy    . APPENDECTOMY    . COLONOSCOPY    . HERNIA REPAIR Bilateral    Inguinal  . I&D EXTREMITY Left 10/12/2014   Procedure: IRRIGATION AND DEBRIDEMENT INDEX FINGER;  Surgeon: Iran Planas, MD;  Location: [facility];  Service: Orthopedics;  Laterality: Left;  . KYPHOPLASTY  2012 ?   T 12- L1  . LAMINECTOMY  2005 ?   lumbar- 4-5  . PROSTATE SURGERY    . ROTATOR CUFF REPAIR Right   . STUMP REVISION Left 12/11/2016   Procedure: Revision Left Transmetatarsal Amputation, Apply Wound VAC;  Surgeon: Newt Minion, MD;  Location: [facility];  Service: Orthopedics;  Laterality: Left;  . TOTAL KNEE ARTHROPLASTY Right    Social History   Occupational History  . Retired [employer]   Social History Main Topics  . Smoking status: Never Smoker  . Smokeless tobacco: Never Used  . Alcohol use 1.2 oz/week    2 Cans of beer per week     Comment: occasional beer  . Drug use: No  .  Sexual activity: Not on file     Comment: Widowed

## 2017-02-07 ENCOUNTER — Telehealth: Payer: Self-pay | Admitting: *Deleted

## 2017-02-07 ENCOUNTER — Telehealth: Payer: Self-pay | Admitting: Interventional Cardiology

## 2017-02-07 MED ORDER — FUROSEMIDE 40 MG PO TABS: 60.0000 mg | ORAL_TABLET | Freq: Every day | ORAL | 3 refills | Status: DC

## 2017-02-07 NOTE — Addendum Note (Signed)
Addendum  created 02/07/17 1254 by Lathaniel Legate, MD   Sign clinical note    

## 2017-02-07 NOTE — Telephone Encounter (Signed)
-----   Message from Belva Crome, MD sent at 02/05/2017  8:09 PM EDT ----- Let the patient know the blood tests are stable. Recommend decreasing furosemide to 60 mg daily as a single daily dose. Notify us if increasing shortness of breath. A copy will be sent to Josetta Huddle, MD

## 2017-02-07 NOTE — Telephone Encounter (Signed)
Spoke with pt and he gave phone to daughter for me to review labs with her. Daughter verbalized understanding and was in agreement with this plan.

## 2017-02-07 NOTE — Anesthesia Postprocedure Evaluation (Signed)
Anesthesia Post Note  Patient: Richard Bradley  Procedure(s) Performed: Procedure(s) (LRB): Revision Left Transmetatarsal Amputation, Apply Wound VAC (Left)     Anesthesia Post Evaluation  Last Vitals:  Vitals:   12/11/16 1102 12/11/16 1103  BP:  103/71  Pulse:    Resp: (!) 28 (!) 21  Temp:      Last Pain:  Vitals:   12/11/16 0733  TempSrc: Oral                 [name] EDWARD

## 2017-02-07 NOTE — Telephone Encounter (Signed)
New message    Pt is calling back about his lab work

## 2017-02-11 ENCOUNTER — Ambulatory Visit (INDEPENDENT_AMBULATORY_CARE_PROVIDER_SITE_OTHER): Payer: Medicare Other | Admitting: *Deleted

## 2017-02-11 DIAGNOSIS — Z5181 Encounter for therapeutic drug level monitoring: Secondary | ICD-10-CM

## 2017-02-11 DIAGNOSIS — Z7901 Long term (current) use of anticoagulants: Secondary | ICD-10-CM | POA: Diagnosis not present

## 2017-02-11 DIAGNOSIS — I482 Chronic atrial fibrillation, unspecified: Secondary | ICD-10-CM

## 2017-02-11 LAB — POCT INR: INR: 2.6

## 2017-03-06 ENCOUNTER — Ambulatory Visit (INDEPENDENT_AMBULATORY_CARE_PROVIDER_SITE_OTHER): Payer: Medicare Other | Admitting: *Deleted

## 2017-03-06 ENCOUNTER — Ambulatory Visit (INDEPENDENT_AMBULATORY_CARE_PROVIDER_SITE_OTHER): Payer: Medicare Other | Admitting: Orthopedic Surgery

## 2017-03-06 DIAGNOSIS — I482 Chronic atrial fibrillation, unspecified: Secondary | ICD-10-CM

## 2017-03-06 DIAGNOSIS — Z5181 Encounter for therapeutic drug level monitoring: Secondary | ICD-10-CM

## 2017-03-06 DIAGNOSIS — Z7901 Long term (current) use of anticoagulants: Secondary | ICD-10-CM

## 2017-03-06 DIAGNOSIS — T8131XD Disruption of external operation (surgical) wound, not elsewhere classified, subsequent encounter: Secondary | ICD-10-CM

## 2017-03-06 DIAGNOSIS — Z89432 Acquired absence of left foot: Secondary | ICD-10-CM

## 2017-03-06 LAB — POCT INR: INR: 2.6

## 2017-03-06 NOTE — Progress Notes (Signed)
Office Visit Note   Patient: Richard Bradley           Date of Birth: [birth year]           MRN: [id number] Visit Date: 03/06/2017              Requested by: Josetta Huddle, MD [address] [place], [place] [address] PCP: Josetta Huddle, MD  Chief Complaint  Patient presents with  . Left Foot - Follow-up      HPI: Patient is an 83 year old gentleman who presents for follow-up for wound dehiscence left transmetatarsal amputation. Patient denies any drainage or odor is states that he walks about 50 feet a day. He is currently postoperative shoe.  Assessment & Plan: Visit Diagnoses:  1. Postoperative wound dehiscence, subsequent encounter   2. S/P transmetatarsal amputation of foot, left ([place])     Plan: Continue with the Silvadene or antibiotic ointment dressing changes. Continue dialysis of cleansing continue with the nitroglycerin patch topically to promote microcirculation.  Follow-Up Instructions: Return in about 4 weeks (around 04/03/2017).   Ortho Exam  Patient is alert, oriented, no adenopathy, well-dressed, normal affect, normal respiratory effort. Examination patient ambulates in a wheelchair. He has no redness no cellulitis no drainage no odor no signs of infection. There is a thin fibrinous exudative layer over the wound but this does not communicate with bone or tendon. The wound is 15 mm in diameter 30 mm in length and 5 mm deep. Iodosorb dressing was applied. Fibrinous tissue was debrided and there was good granulation tissue at the base.  Imaging: No results found.  Labs: Lab Results  Component Value Date   HGBA1C 5.6 09/26/2011   ESRSEDRATE 7 05/24/2016   ESRSEDRATE 29 (H) 10/12/2014   ESRSEDRATE 8 09/26/2011   CRP 6.4 (H) 05/24/2016   CRP 14.0 (H) 10/12/2014   CRP 0.02 09/26/2011   REPTSTATUS 12/15/2016 FINAL 12/12/2016   GRAMSTAIN  10/12/2014    RARE WBC PRESENT,BOTH PMN AND MONONUCLEAR NO ORGANISMS SEEN Performed at [facility]    GRAMSTAIN  10/12/2014    RARE WBC PRESENT,BOTH PMN AND MONONUCLEAR NO ORGANISMS SEEN Performed at [facility]    CULT 10,000 COLONIES/mL PSEUDOMONAS AERUGINOSA (A) 12/12/2016   LABORGA PSEUDOMONAS AERUGINOSA (A) 12/12/2016    Orders:  No orders of the defined types were placed in this encounter.  No orders of the defined types were placed in this encounter.    Procedures: No procedures performed  Clinical Data: No additional findings.  ROS:  All other systems negative, except as noted in the HPI. Review of Systems  Objective: Vital Signs: There were no vitals taken for this visit.  Specialty Comments:  No specialty comments available.  PMFS History: Patient Active Problem List   Diagnosis Date Noted  . S/P transmetatarsal amputation of foot, left ([place]) 02/06/2017  . Pseudomonas urinary tract infection 12/16/2016  . Altered mental status   . Sepsis ([place]) 12/12/2016  . Chronic systolic CHF (congestive heart failure) ([place]) 12/12/2016  . Wound dehiscence, surgical 10/03/2016  . Acute respiratory distress   . Goals of care, counseling/discussion   . Palliative care by specialist   . Hypokalemia 09/21/2016  . Benign essential HTN 09/21/2016  . Anticoagulated on Coumadin 12/20/2013  . Myasthenia gravis ([place]) 06/27/2011  . Chronic atrial fibrillation [facility])    Past Medical History:  Diagnosis Date  . Atrial fibrillation ([place])   . Cellulitis    left arm  . CHF (congestive  heart failure) ([place])   . Dyspnea    oxygen prn  . Dysrhythmia    afib  . GERD (gastroesophageal reflux disease)    ocassional  . History of kidney stones    passed  . Myasthenia gravis   . Osteoarthritis   . Pneumonia    Hstory of  . 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 Prostate cancer ([place]) dx'd 1982   treated with surgery and radiation.  Basal cell cancer- many  . Sepsis ([place])   . Urine incontinence   . Varicose veins of left lower extremity     Family  History  Problem Relation Age of Onset  . Heart disease Father   . Heart attack Father   . Hypertension Father   . Heart disease Brother   . Heart disease Paternal Grandfather   . Stroke Mother   . Parkinsonism Sister   . Heart disease Sister   . Dementia Sister     Past Surgical History:  Procedure Laterality Date  . AMPUTATION Left 03/20/2016   Procedure: Left Great Toe Amputation at Metatarsophalangeal Joint;  Surgeon: Newt Minion, MD;  Location: [place];  Service: Orthopedics;  Laterality: Left;  . AMPUTATION Left 06/11/2016   Procedure: Left 2nd Toe Amputation;  Surgeon: Newt Minion, MD;  Location: [place];  Service: Orthopedics;  Laterality: Left;  . AMPUTATION Left 07/12/2016   Procedure: LEFT 3rd TOE AMPUTATION;  Surgeon: Newt Minion, MD;  Location: [place];  Service: Orthopedics;  Laterality: Left;  . AMPUTATION Left 09/11/2016   Procedure: Left Transmetatarsal Amputation;  Surgeon: Newt Minion, MD;  Location: [place];  Service: Orthopedics;  Laterality: Left;  . appendectomy    . APPENDECTOMY    . COLONOSCOPY    . HERNIA REPAIR Bilateral    Inguinal  . I&D EXTREMITY Left 10/12/2014   Procedure: IRRIGATION AND DEBRIDEMENT INDEX FINGER;  Surgeon: Iran Planas, MD;  Location: [place];  Service: Orthopedics;  Laterality: Left;  . KYPHOPLASTY  2012 ?   T 12- L1  . LAMINECTOMY  2005 ?   lumbar- 4-5  . PROSTATE SURGERY    . ROTATOR CUFF REPAIR Right   . STUMP REVISION Left 12/11/2016   Procedure: Revision Left Transmetatarsal Amputation, Apply Wound VAC;  Surgeon: Newt Minion, MD;  Location: [place];  Service: Orthopedics;  Laterality: Left;  . TOTAL KNEE ARTHROPLASTY Right    Social History   Occupational History  . Retired [employer]   Social History Main Topics  . Smoking status: Never Smoker  . Smokeless tobacco: Never Used  . Alcohol use 1.2 oz/week    2 Cans of beer per week     Comment: occasional beer  . Drug use: No  . Sexual  activity: Not on file     Comment: Widowed

## 2017-03-07 ENCOUNTER — Ambulatory Visit (INDEPENDENT_AMBULATORY_CARE_PROVIDER_SITE_OTHER): Payer: Medicare Other | Admitting: Neurology

## 2017-03-07 ENCOUNTER — Encounter (HOSPITAL_COMMUNITY): Payer: Self-pay | Admitting: *Deleted

## 2017-03-07 ENCOUNTER — Inpatient Hospital Stay (HOSPITAL_COMMUNITY)
Admission: EM | Admit: 2017-03-07 | Discharge: 2017-03-09 | DRG: 872 | Disposition: A | Discharge status: Home / Self Care | Payer: Medicare Other | Attending: Internal Medicine | Admitting: Internal Medicine

## 2017-03-07 ENCOUNTER — Telehealth: Payer: Self-pay | Admitting: Interventional Cardiology

## 2017-03-07 ENCOUNTER — Encounter: Payer: Self-pay | Admitting: Neurology

## 2017-03-07 ENCOUNTER — Emergency Department (HOSPITAL_COMMUNITY): Payer: Medicare Other

## 2017-03-07 VITALS — BP 87/58 | HR 62 | Ht 75.0 in

## 2017-03-07 DIAGNOSIS — Z8249 Family history of ischemic heart disease and other diseases of the circulatory system: Secondary | ICD-10-CM | POA: Diagnosis not present

## 2017-03-07 DIAGNOSIS — Z8546 Personal history of malignant neoplasm of prostate: Secondary | ICD-10-CM

## 2017-03-07 DIAGNOSIS — T380X5A Adverse effect of glucocorticoids and synthetic analogues, initial encounter: Secondary | ICD-10-CM | POA: Diagnosis present

## 2017-03-07 DIAGNOSIS — I482 Chronic atrial fibrillation, unspecified: Secondary | ICD-10-CM | POA: Diagnosis present

## 2017-03-07 DIAGNOSIS — Z85828 Personal history of other malignant neoplasm of skin: Secondary | ICD-10-CM

## 2017-03-07 DIAGNOSIS — Z7952 Long term (current) use of systemic steroids: Secondary | ICD-10-CM

## 2017-03-07 DIAGNOSIS — K219 Gastro-esophageal reflux disease without esophagitis: Secondary | ICD-10-CM | POA: Diagnosis present

## 2017-03-07 DIAGNOSIS — A419 Sepsis, unspecified organism: Secondary | ICD-10-CM | POA: Diagnosis not present

## 2017-03-07 DIAGNOSIS — N179 Acute kidney failure, unspecified: Secondary | ICD-10-CM | POA: Diagnosis present

## 2017-03-07 DIAGNOSIS — Z89432 Acquired absence of left foot: Secondary | ICD-10-CM | POA: Diagnosis not present

## 2017-03-07 DIAGNOSIS — Z7901 Long term (current) use of anticoagulants: Secondary | ICD-10-CM

## 2017-03-07 DIAGNOSIS — Z96651 Presence of right artificial knee joint: Secondary | ICD-10-CM | POA: Diagnosis present

## 2017-03-07 DIAGNOSIS — I5022 Chronic systolic (congestive) heart failure: Secondary | ICD-10-CM | POA: Diagnosis not present

## 2017-03-07 DIAGNOSIS — I10 Essential (primary) hypertension: Secondary | ICD-10-CM

## 2017-03-07 DIAGNOSIS — I130 Hypertensive heart and chronic kidney disease with heart failure and stage 1 through stage 4 chronic kidney disease, or unspecified chronic kidney disease: Secondary | ICD-10-CM | POA: Diagnosis present

## 2017-03-07 DIAGNOSIS — I959 Hypotension, unspecified: Secondary | ICD-10-CM | POA: Insufficient documentation

## 2017-03-07 DIAGNOSIS — Z823 Family history of stroke: Secondary | ICD-10-CM

## 2017-03-07 DIAGNOSIS — L03115 Cellulitis of right lower limb: Secondary | ICD-10-CM | POA: Diagnosis not present

## 2017-03-07 DIAGNOSIS — D899 Disorder involving the immune mechanism, unspecified: Secondary | ICD-10-CM | POA: Diagnosis present

## 2017-03-07 DIAGNOSIS — Z5181 Encounter for therapeutic drug level monitoring: Secondary | ICD-10-CM | POA: Diagnosis not present

## 2017-03-07 DIAGNOSIS — L0390 Cellulitis, unspecified: Secondary | ICD-10-CM

## 2017-03-07 DIAGNOSIS — N183 Chronic kidney disease, stage 3 (moderate): Secondary | ICD-10-CM | POA: Diagnosis present

## 2017-03-07 DIAGNOSIS — G7000 Myasthenia gravis without (acute) exacerbation: Secondary | ICD-10-CM

## 2017-03-07 LAB — COMPREHENSIVE METABOLIC PANEL
ALBUMIN: 3.3 g/dL — AB (ref 3.5–5.0)
ALK PHOS: 52 U/L (ref 38–126)
ALT: 19 U/L (ref 17–63)
AST: 32 U/L (ref 15–41)
Anion gap: 9 (ref 5–15)
BILIRUBIN TOTAL: 1 mg/dL (ref 0.3–1.2)
BUN: 23 mg/dL — AB (ref 6–20)
CALCIUM: 8.4 mg/dL — AB (ref 8.9–10.3)
CO2: 26 mmol/L (ref 22–32)
CREATININE: 1.18 mg/dL (ref 0.61–1.24)
Chloride: 101 mmol/L (ref 101–111)
GFR calc Af Amer: >60 mL/min (ref >60)
GFR calc non Af Amer: 53 mL/min — ABNORMAL LOW (ref >60)
GLUCOSE: 151 mg/dL — AB (ref 65–99)
POTASSIUM: 4.4 mmol/L (ref 3.5–5.1)
Sodium: 136 mmol/L (ref 135–145)
Total Protein: 5.5 g/dL — ABNORMAL LOW (ref 6.5–8.1)

## 2017-03-07 LAB — URINALYSIS, ROUTINE W REFLEX MICROSCOPIC
Bacteria, UA: NONE SEEN
Bilirubin Urine: NEGATIVE
GLUCOSE, UA: NEGATIVE mg/dL
Ketones, ur: NEGATIVE mg/dL
LEUKOCYTES UA: NEGATIVE
NITRITE: NEGATIVE
Protein, ur: NEGATIVE mg/dL
SPECIFIC GRAVITY, URINE: 1.014 (ref 1.005–1.030)
SQUAMOUS EPITHELIAL / LPF: NONE SEEN
pH: 6 (ref 5.0–8.0)

## 2017-03-07 LAB — CBC WITH DIFFERENTIAL/PLATELET
Basophils Absolute: 0 10*3/uL (ref 0.0–0.1)
Basophils Relative: 0 %
EOS ABS: 0 10*3/uL (ref 0.0–0.7)
Eosinophils Relative: 0 %
HCT: 35.5 % — ABNORMAL LOW (ref 39.0–52.0)
HEMOGLOBIN: 11.2 g/dL — AB (ref 13.0–17.0)
LYMPHS ABS: 0.5 10*3/uL — AB (ref 0.7–4.0)
Lymphocytes Relative: 4 %
MCH: 29.4 pg (ref 26.0–34.0)
MCHC: 31.5 g/dL (ref 30.0–36.0)
MCV: 93.2 fL (ref 78.0–100.0)
MONOS PCT: 5 %
Monocytes Absolute: 0.5 10*3/uL (ref 0.1–1.0)
NEUTROS PCT: 91 %
Neutro Abs: 10.7 10*3/uL — ABNORMAL HIGH (ref 1.7–7.7)
Platelets: 157 10*3/uL (ref 150–400)
RBC: 3.81 MIL/uL — ABNORMAL LOW (ref 4.22–5.81)
RDW: 16 % — ABNORMAL HIGH (ref 11.5–15.5)
WBC: 11.8 10*3/uL — ABNORMAL HIGH (ref 4.0–10.5)

## 2017-03-07 LAB — I-STAT CG4 LACTIC ACID, ED
LACTIC ACID, VENOUS: 1.11 mmol/L (ref 0.5–1.9)
Lactic Acid, Venous: 2.84 mmol/L (ref 0.5–1.9)

## 2017-03-07 LAB — PROTIME-INR
INR: 2.19
PROTHROMBIN TIME: 24.7 s — AB (ref 11.4–15.2)

## 2017-03-07 MED ORDER — SODIUM CHLORIDE 0.9 % IV BOLUS (SEPSIS)
500.0000 mL | Freq: Once | INTRAVENOUS | Status: AC
  Administered 2017-03-07: 500 mL via INTRAVENOUS

## 2017-03-07 MED ORDER — VANCOMYCIN HCL IN DEXTROSE 1-5 GM/200ML-% IV SOLN: 1000.0000 mg | Freq: Once | INTRAVENOUS | Status: DC

## 2017-03-07 MED ORDER — PREDNISONE 5 MG PO TABS
17.5000 mg | ORAL_TABLET | Freq: Every day | ORAL | Status: DC
  Administered 2017-03-08: 17.5 mg via ORAL
  Filled 2017-03-07 (×2): qty 1

## 2017-03-07 MED ORDER — POTASSIUM CHLORIDE CRYS ER 20 MEQ PO TBCR
20.0000 meq | EXTENDED_RELEASE_TABLET | Freq: Every day | ORAL | Status: DC
  Administered 2017-03-08 – 2017-03-09 (×2): 20 meq via ORAL
  Filled 2017-03-07 (×2): qty 1

## 2017-03-07 MED ORDER — WARFARIN - PHARMACIST DOSING INPATIENT: Freq: Every day | Status: DC

## 2017-03-07 MED ORDER — VANCOMYCIN HCL 10 G IV SOLR
1750.0000 mg | Freq: Once | INTRAVENOUS | Status: AC
  Administered 2017-03-07: 1750 mg via INTRAVENOUS
  Filled 2017-03-07: qty 1750

## 2017-03-07 MED ORDER — FLUTICASONE PROPIONATE 50 MCG/ACT NA SUSP
2.0000 | Freq: Two times a day (BID) | NASAL | Status: DC
  Administered 2017-03-08 – 2017-03-09 (×3): 2 via NASAL
  Filled 2017-03-07: qty 16

## 2017-03-07 MED ORDER — SODIUM CHLORIDE 0.9% FLUSH
3.0000 mL | Freq: Two times a day (BID) | INTRAVENOUS | Status: DC
  Administered 2017-03-08 – 2017-03-09 (×2): 3 mL via INTRAVENOUS

## 2017-03-07 MED ORDER — GUAIFENESIN ER 600 MG PO TB12: 600.0000 mg | ORAL_TABLET | Freq: Every evening | ORAL | Status: DC | PRN

## 2017-03-07 MED ORDER — PANTOPRAZOLE SODIUM 40 MG PO TBEC
40.0000 mg | DELAYED_RELEASE_TABLET | Freq: Every day | ORAL | Status: DC
  Administered 2017-03-08 – 2017-03-09 (×2): 40 mg via ORAL
  Filled 2017-03-07 (×2): qty 1

## 2017-03-07 MED ORDER — SODIUM CHLORIDE 0.9 % IV SOLN
INTRAVENOUS | Status: DC
  Administered 2017-03-08: 01:00:00 via INTRAVENOUS

## 2017-03-07 MED ORDER — VITAMIN D 1000 UNITS PO TABS
2000.0000 [IU] | ORAL_TABLET | Freq: Every day | ORAL | Status: DC
  Administered 2017-03-08 – 2017-03-09 (×2): 2000 [IU] via ORAL
  Filled 2017-03-07 (×2): qty 2

## 2017-03-07 MED ORDER — PIPERACILLIN-TAZOBACTAM 3.375 G IVPB 30 MIN
3.3750 g | Freq: Once | INTRAVENOUS | Status: AC
  Administered 2017-03-07: 3.375 g via INTRAVENOUS
  Filled 2017-03-07: qty 50

## 2017-03-07 MED ORDER — PREDNISONE 5 MG PO TABS: 7.5000 mg | ORAL_TABLET | Freq: Every day | ORAL | 2 refills | Status: DC

## 2017-03-07 MED ORDER — POLYETHYLENE GLYCOL 3350 17 G PO PACK: 17.0000 g | PACK | Freq: Every day | ORAL | Status: DC | PRN

## 2017-03-07 MED ORDER — ESCITALOPRAM OXALATE 10 MG PO TABS
5.0000 mg | ORAL_TABLET | Freq: Every day | ORAL | Status: DC
Start: 2017-03-07 — End: 2017-03-09
  Administered 2017-03-08 (×2): 5 mg via ORAL
  Filled 2017-03-07 (×2): qty 1

## 2017-03-07 MED ORDER — WARFARIN SODIUM 5 MG PO TABS
5.0000 mg | ORAL_TABLET | ORAL | Status: AC
  Administered 2017-03-07: 5 mg via ORAL
  Filled 2017-03-07: qty 1

## 2017-03-07 MED ORDER — HYDROCODONE-ACETAMINOPHEN 10-325 MG PO TABS: 1.0000 | ORAL_TABLET | Freq: Four times a day (QID) | ORAL | Status: DC | PRN

## 2017-03-07 MED ORDER — VANCOMYCIN HCL IN DEXTROSE 750-5 MG/150ML-% IV SOLN
750.0000 mg | Freq: Two times a day (BID) | INTRAVENOUS | Status: DC
  Administered 2017-03-08 (×2): 750 mg via INTRAVENOUS
  Filled 2017-03-07 (×4): qty 150

## 2017-03-07 MED ORDER — PIPERACILLIN-TAZOBACTAM 3.375 G IVPB
3.3750 g | Freq: Three times a day (TID) | INTRAVENOUS | Status: DC
  Administered 2017-03-08 – 2017-03-09 (×5): 3.375 g via INTRAVENOUS
  Filled 2017-03-07 (×6): qty 50

## 2017-03-07 NOTE — ED Notes (Signed)
Heat applied to patient right forearm at site of IV infiltration.

## 2017-03-07 NOTE — Telephone Encounter (Signed)
Spoke with [name], DPR on file.  Pt's BP yesterday at dentist was 89/54, today at Neuro was 89/58.  Pt has one episode of dizziness daily when he gets up and gets to moving.  Resolves with rest.  Pt had recent foot surgery and has been non wt bearing for awhile.  Was told a couple of days ago he could go to touch down wt bearing.  [name] states gradually pt has been more SOB and noticed it more the last couple of days since he has been allowed to move around more.  Denies swelling.  Has been more fatigued the last two days.  Explained fatigue is probably from pt being sedentary and is now up moving around and this should improve as he builds his strength back up.  [name] concerned about BP being so low and wanted to get Dr. Thompson Caul recommendations on this.  Will route to Dr. Tamala Julian for review and advisement.

## 2017-03-07 NOTE — ED Notes (Signed)
[facility] contacted to activate Code Sepsis 

## 2017-03-07 NOTE — ED Provider Notes (Signed)
[facility] DEPT Provider Note   CSN: [id number] Arrival date & time: 03/07/17  1415     History   Chief Complaint Chief Complaint  Patient presents with  . Fatigue  . Altered Mental Status    HPI Richard Bradley is a 83 y.o. male. history of myasthenia gravis on chronic prednisone, A. fib on Coumadin, chronic systolic CHF with EF of 26%, on chronic home O2 as needed and severe peripheral artery disease requiring transmetatarsal amputation in February this year followed by sepsis and Pseudomonas bacteremia who presents with lethargy and low blood pressure. Daughter is bedside, lives with father and provides history. Admission 5/17-21 For sepsis secondary to cellulitis and Pseudomonas UTI. Daughter reports the patient for the past 24 hours has been more lethargic than usual, having difficulty keeping His eyes open. He has had follow-up with neurologist and orthopedist this week for myasthenia gravis and his metatarsal amputation, was noted to have lower blood pressures at that time, however otherwise asymptomatic. Ortho reported good wound healing  of his left lower extremity. In the past 24 hours, he has had some low-grade temperatures in the 99's. He intermittently requires oxygen at home, however had desaturation to low 80s which is atypical for him and was placed back on his 2 L nasal cannula. She denies any cough, vomiting, chest pain, abdominal pain, or diarrhea. Patient denies any urinary complaints. HPI  Past Medical History:  Diagnosis Date  . Atrial fibrillation ([place])   . Cellulitis    left arm  . CHF (congestive heart failure) ([place])   . Dyspnea    oxygen prn  . Dysrhythmia    afib  . GERD (gastroesophageal reflux disease)    ocassional  . History of kidney stones    passed  . Myasthenia gravis   . Osteoarthritis   . Pneumonia    Hstory of  . 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 Prostate cancer ([place]) dx'd 1982   treated with surgery and radiation.   Basal cell cancer- many  . Sepsis ([place])   . Urine incontinence   . Varicose veins of left lower extremity     Patient Active Problem List   Diagnosis Date Noted  . Sepsis affecting skin ([place]) 03/07/2017  . Cellulitis of right lower extremity   . Hypotension   . S/P transmetatarsal amputation of foot, left ([place]) 02/06/2017  . Pseudomonas urinary tract infection 12/16/2016  . Altered mental status   . Sepsis ([place]) 12/12/2016  . Chronic systolic CHF (congestive heart failure) ([place]) 12/12/2016  . Wound dehiscence, surgical 10/03/2016  . Acute respiratory distress   . Goals of care, counseling/discussion   . Palliative care by specialist   . Hypokalemia 09/21/2016  . Benign essential HTN 09/21/2016  . Anticoagulated on Coumadin 12/20/2013  . Myasthenia gravis ([place]) 06/27/2011  . Chronic atrial fibrillation [facility])     Past Surgical History:  Procedure Laterality Date  . AMPUTATION Left 03/20/2016   Procedure: Left Great Toe Amputation at Metatarsophalangeal Joint;  Surgeon: Newt Minion, MD;  Location: [place];  Service: Orthopedics;  Laterality: Left;  . AMPUTATION Left 06/11/2016   Procedure: Left 2nd Toe Amputation;  Surgeon: Newt Minion, MD;  Location: [place];  Service: Orthopedics;  Laterality: Left;  . AMPUTATION Left 07/12/2016   Procedure: LEFT 3rd TOE AMPUTATION;  Surgeon: Newt Minion, MD;  Location: [place];  Service: Orthopedics;  Laterality: Left;  . AMPUTATION Left 09/11/2016   Procedure: Left Transmetatarsal Amputation;  Surgeon: Beverely Low  Fernanda Drum, MD;  Location: [place];  Service: Orthopedics;  Laterality: Left;  . appendectomy    . APPENDECTOMY    . COLONOSCOPY    . HERNIA REPAIR Bilateral    Inguinal  . I&D EXTREMITY Left 10/12/2014   Procedure: IRRIGATION AND DEBRIDEMENT INDEX FINGER;  Surgeon: Iran Planas, MD;  Location: [place];  Service: Orthopedics;  Laterality: Left;  . KYPHOPLASTY  2012 ?   T 12- L1  . LAMINECTOMY  2005 ?   lumbar- 4-5  . PROSTATE SURGERY    .  ROTATOR CUFF REPAIR Right   . STUMP REVISION Left 12/11/2016   Procedure: Revision Left Transmetatarsal Amputation, Apply Wound VAC;  Surgeon: Newt Minion, MD;  Location: [place];  Service: Orthopedics;  Laterality: Left;  . TOTAL KNEE ARTHROPLASTY Right        Home Medications    Prior to Admission medications   Medication Sig Start Date End Date Taking? Authorizing Provider  Cholecalciferol (VITAMIN D) 2000 units CAPS Take 2,000 Units by mouth daily.   Yes [provider]  escitalopram (LEXAPRO) 5 MG tablet Take 5 mg by mouth at bedtime. 09/15/16  Yes [provider]  FIBER PO Take 1 tablet by mouth 3 (three) times daily.    Yes [provider]  fluticasone (FLONASE) 50 MCG/ACT nasal spray Place 2 sprays into both nostrils 2 (two) times daily.    Yes [provider]  furosemide (LASIX) 40 MG tablet Take 1.5 tablets (60 mg total) by mouth daily. 02/07/17 05/08/17 Yes Belva Crome, MD  guaiFENesin (MUCINEX) 600 MG 12 hr tablet Take 600 mg by mouth at bedtime as needed for to loosen phlegm.    Yes [provider]  HYDROcodone-acetaminophen (NORCO) 10-325 MG tablet Take 1 tablet by mouth every 6 (six) hours as needed (for pain). 12/16/16  Yes Dhungel, Nishant, MD  metoprolol tartrate (LOPRESSOR) 25 MG tablet Take 25 mg by mouth 2 (two) times daily. 01/07/17  Yes [provider]  nitroGLYCERIN (NITRODUR - DOSED IN MG/24 HR) 0.2 mg/hr patch Apply one (1) patch to the left foot every other day. 01/22/17  Yes [provider]  pantoprazole (PROTONIX) 40 MG tablet Take 1 tablet (40 mg total) by mouth daily. 10/02/16  Yes Rai, Ripudeep K, MD  polyethylene glycol (MIRALAX / GLYCOLAX) packet Take 17 g by mouth daily as needed for moderate constipation. For constipation Patient taking differently: Take 17 g by mouth daily as needed for mild constipation or moderate constipation.  10/02/16  Yes Rai, Ripudeep K, MD  potassium chloride SA  (K-DUR,KLOR-CON) 20 MEQ tablet Take 1 tablet (20 mEq total) by mouth daily. Patient taking differently: Take 20 mEq by mouth daily with lunch.  10/02/16  Yes Rai, Ripudeep K, MD  predniSONE (DELTASONE) 20 MG tablet Take 20 mg by mouth daily with breakfast.   Yes [provider]  vitamin B-12 (CYANOCOBALAMIN) 1000 MCG tablet Take 1,000 mcg by mouth daily with lunch.    Yes [provider]  warfarin (COUMADIN) 5 MG tablet Take as directed by coumadin clinic Patient taking differently: Take 2.5-5 mg by mouth See admin instructions. 2.5 mg in the evening on Sun/Tues/Wed/Thurs/Sat and 5 mg on Mon/Fri 07/11/16  Yes Belva Crome, MD  predniSONE (DELTASONE) 5 MG tablet Take 1.5 tablets (7.5 mg total) by mouth daily with breakfast. Patient not taking: Reported on 03/07/2017 03/07/17   Kathrynn Ducking, MD    Family History Family History  Problem Relation  Age of Onset  . Heart disease Father   . Heart attack Father   . Hypertension Father   . Heart disease Brother   . Heart disease Paternal Grandfather   . Stroke Mother   . Parkinsonism Sister   . Heart disease Sister   . Dementia Sister     Social History Social History  Substance Use Topics  . Smoking status: Never Smoker  . Smokeless tobacco: Never Used  . Alcohol use 1.2 oz/week    2 Cans of beer per week     Comment: occasional beer     Allergies   Tape   Review of Systems Review of Systems  Constitutional: Positive for activity change and fatigue. Negative for chills and fever.  HENT: Negative for ear pain and sore throat.   Eyes: Negative for pain and visual disturbance.  Respiratory: Negative for cough and shortness of breath.        Hypoxia  Cardiovascular: Negative for chest pain and palpitations.  Gastrointestinal: Negative for abdominal pain and vomiting.  Genitourinary: Negative for dysuria and hematuria.  Musculoskeletal: Negative for arthralgias and back pain.  Skin: Positive for color change  (right lower extremity redness) and wound. Negative for rash.  Neurological: Negative for seizures and syncope.  All other systems reviewed and are negative.    Physical Exam Updated Vital Signs BP 111/83   Pulse 97   Temp (S) 100.1 F (37.8 C) (Rectal)   Resp (!) 29   Wt 77.1 kg (170 lb)   SpO2 95%   BMI 21.25 kg/m   Physical Exam  Constitutional: He appears well-developed and well-nourished.  HENT:  Head: Normocephalic and atraumatic.  Eyes: Conjunctivae are normal.  Neck: Neck supple.  Cardiovascular: Normal rate and regular rhythm.   No murmur heard. Pulmonary/Chest: Effort normal and breath sounds normal. No respiratory distress.  Abdominal: Soft. There is no tenderness.  Musculoskeletal: He exhibits no edema or tenderness.  Right lower extremity with redness and warmth up to knee. Minimal TTP. LLE with metatarsal amputation, dressing c/d/i  Neurological: He is alert.  Skin: Skin is warm and dry.  Psychiatric: He has a normal mood and affect.  Nursing note and vitals reviewed.    ED Treatments / Results  Labs (all labs ordered are listed, but only abnormal results are displayed) Labs Reviewed  COMPREHENSIVE METABOLIC PANEL - Abnormal; Notable for the following:       Result Value   Glucose, Bld 151 (*)    BUN 23 (*)    Calcium 8.4 (*)    Total Protein 5.5 (*)    Albumin 3.3 (*)    GFR calc non Af Amer 53 (*)    All other components within normal limits  CBC WITH DIFFERENTIAL/PLATELET - Abnormal; Notable for the following:    WBC 11.8 (*)    RBC 3.81 (*)    Hemoglobin 11.2 (*)    HCT 35.5 (*)    RDW 16.0 (*)    Neutro Abs 10.7 (*)    Lymphs Abs 0.5 (*)    All other components within normal limits  PROTIME-INR - Abnormal; Notable for the following:    Prothrombin Time 24.7 (*)    All other components within normal limits  URINALYSIS, ROUTINE W REFLEX MICROSCOPIC - Abnormal; Notable for the following:    Hgb urine dipstick MODERATE (*)    All other  components within normal limits  I-STAT CG4 LACTIC ACID, ED - Abnormal; Notable for the following:  Lactic Acid, Venous 2.84 (*)    All other components within normal limits  CULTURE, BLOOD (ROUTINE X 2)  CULTURE, BLOOD (ROUTINE X 2)  URINE CULTURE  PROTIME-INR  I-STAT CG4 LACTIC ACID, ED    EKG  EKG Interpretation None       Radiology Dg Chest Port 1 View  Result Date: 03/07/2017 CLINICAL DATA:  Hypoxia. EXAM: PORTABLE CHEST 1 VIEW COMPARISON:  Radiographs of Dec 12, 2016. FINDINGS: Stable cardiomegaly. Stable biapical calcifications. No pneumothorax or pleural effusion is noted. No acute pulmonary disease is noted. Bony thorax is unremarkable. IMPRESSION: No acute cardiopulmonary abnormality seen. Electronically Signed   By: Marijo Conception, M.D.   On: 03/07/2017 15:51    Procedures Procedures (including critical care time)  Medications Ordered in ED Medications  piperacillin-tazobactam (ZOSYN) IVPB 3.375 g (not administered)  vancomycin (VANCOCIN) IVPB 750 mg/150 ml premix (not administered)  Warfarin - Pharmacist Dosing Inpatient (not administered)  sodium chloride 0.9 % bolus 500 mL (0 mLs Intravenous Stopped 03/07/17 1759)  piperacillin-tazobactam (ZOSYN) IVPB 3.375 g (0 g Intravenous Stopped 03/07/17 1758)  vancomycin (VANCOCIN) 1,750 mg in sodium chloride 0.9 % 500 mL IVPB (0 mg Intravenous Stopped 03/07/17 2057)  sodium chloride 0.9 % bolus 500 mL (0 mLs Intravenous Stopped 03/07/17 2204)  warfarin (COUMADIN) tablet 5 mg (5 mg Oral Given 03/07/17 2157)     Initial Impression / Assessment and Plan / ED Course  I have reviewed the triage vital signs and the nursing notes.  Pertinent labs & imaging results that were available during my care of the patient were reviewed by me and considered in my medical decision making (see chart for details).    Patient is a 83 y/o male history of myasthenia gravis on chronic prednisone, A. fib on Coumadin, chronic systolic CHF with  EF of 03%, on chronic home O2 as needed and severe peripheral artery disease requiring transmetatarsal amputation in February this year followed by sepsis and Pseudomonas bacteremia who presents with lethargy and hypotension. Patient arrived with mild hypotension, rectal temperature 100.1, intermittent tachycardia. Exam otherwise as above, right lower extremity with erythema.  Workup significant for normal UA, stable creatinine, mild leukocytosis. Chest x-ray negative for acute findings. Lactate elevated, downtrending on repeat after IV fluids.  Concern for sepsis, suspect source secondary to right lower extremity cellulitis. Patient treated with broad-spectrum antibiotics, with improvement in his blood pressure with gentle fluid rehydration. Did not have to give stress dose steroids given his improvement in his blood pressure. Discussed with hospitalist for admission. Patient and his daughter in agreement with plan at time of admission.  Patient and plan of care discussed with Attending physician, Dr. Ashok Cordia.    Final Clinical Impressions(s) / ED Diagnoses   Final diagnoses:  Sepsis, due to unspecified organism [facility])  Cellulitis of right lower extremity  Hypotension, unspecified hypotension type    New Prescriptions New Prescriptions   No medications on file     Arnetha Massy, MD 03/07/17 2250    Lajean Saver, MD 03/08/17 [phone]

## 2017-03-07 NOTE — Patient Instructions (Signed)
   We will reduce the prednisone to 17.5 mg daily, take one 10 mg and 1.5 of the 5 mg tablets daily.

## 2017-03-07 NOTE — ED Notes (Signed)
Pt provided urinal, made aware of need for urine sample

## 2017-03-07 NOTE — ED Notes (Signed)
pts daughter at bedside states pt has had multiple instances of sepsis in the past, pt began feeling very lethargic and somewhat altered at times today causing her to be concerned. Pt alert and oriented, denies any pain or urinary symptoms at this time.

## 2017-03-07 NOTE — Progress Notes (Signed)
Pharmacy Antibiotic Note  Richard Bradley is a 83 y.o. male admitted on 03/07/2017 with sepsis.  Recent hx of cellulitis of L leg/foot. Pharmacy has been consulted for vancomycin/zosyn dosing. Afebrile, WBC 11.8, LA 2.84. SCr 1.18 on admit, CrCl~46.  Plan: Zosyn 3.375g IV (11min infusion) x1; then 3.375g IV q8h (4h infusion) Vancomycin 1750mg  IV x1; then 750mg  IV q12h Monitor clinical progress, c/s, renal function F/u de-escalation plan/LOT, vancomycin trough as indicated   Weight: 170 lb (77.1 kg)  Temp (24hrs), Avg:99.9 F (37.7 C), Min:99.9 F (37.7 C), Max:99.9 F (37.7 C)   Recent Labs Lab 03/07/17 1536  LATICACIDVEN 2.84*    CrCl cannot be calculated (Patient's most recent lab result is older than the maximum 21 days allowed.).    Allergies  Allergen Reactions  . Tape Other (See Comments)    PATIENT'S SKIN IS VERY THIN AND WILL TEAR AND BRUISE VERY EASILY!! Paper take is ok    Antimicrobials this admission: 8/10 vancomycin >>  8/10 zosyn >>   Dose adjustments this admission:   Microbiology results:   Elicia Lamp, PharmD, BCPS Clinical Pharmacist 03/07/2017 3:53 PM

## 2017-03-07 NOTE — ED Notes (Signed)
Ordered hospital bed from [facility]

## 2017-03-07 NOTE — Progress Notes (Signed)
Reason for visit: Myasthenia gravis  Richard Bradley is an 83 y.o. male  History of present illness:  Richard Bradley is an 83 year old right-handed white male with a history of myasthenia gravis. The patient is currently on 20 mg daily of prednisone, he is still recovering from a cellulitis that affected the left foot and leg. The patient is still not weightbearing, he has not been able to ambulate at all over the last 6 months. The patient is wheelchair-bound. The patient sleeps a good portion of the day and has problems with insomnia at night. He is not active at all, he is not doing any exercises. The patient denies any significant problems with chewing or swallowing or any problems with double vision. He returns this office for an evaluation.  Past Medical History:  Diagnosis Date  . Atrial fibrillation ([place])   . Cellulitis    left arm  . CHF (congestive heart failure) ([place])   . Dyspnea    oxygen prn  . Dysrhythmia    afib  . GERD (gastroesophageal reflux disease)    ocassional  . History of kidney stones    passed  . Myasthenia gravis   . Osteoarthritis   . Pneumonia    Hstory of  . 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 Prostate cancer ([place]) dx'd 1982   treated with surgery and radiation.  Basal cell cancer- many  . Sepsis ([place])   . Urine incontinence   . Varicose veins of left lower extremity     Past Surgical History:  Procedure Laterality Date  . AMPUTATION Left 03/20/2016   Procedure: Left Great Toe Amputation at Metatarsophalangeal Joint;  Surgeon: Richard Minion, MD;  Location: [place];  Service: Orthopedics;  Laterality: Left;  . AMPUTATION Left 06/11/2016   Procedure: Left 2nd Toe Amputation;  Surgeon: Richard Minion, MD;  Location: [place];  Service: Orthopedics;  Laterality: Left;  . AMPUTATION Left 07/12/2016   Procedure: LEFT 3rd TOE AMPUTATION;  Surgeon: Richard Minion, MD;  Location: [place];  Service: Orthopedics;  Laterality: Left;  . AMPUTATION  Left 09/11/2016   Procedure: Left Transmetatarsal Amputation;  Surgeon: Richard Minion, MD;  Location: [place];  Service: Orthopedics;  Laterality: Left;  . appendectomy    . APPENDECTOMY    . COLONOSCOPY    . HERNIA REPAIR Bilateral    Inguinal  . I&D EXTREMITY Left 10/12/2014   Procedure: IRRIGATION AND DEBRIDEMENT INDEX FINGER;  Surgeon: Richard Planas, MD;  Location: [place];  Service: Orthopedics;  Laterality: Left;  . KYPHOPLASTY  2012 ?   T 12- L1  . LAMINECTOMY  2005 ?   lumbar- 4-5  . PROSTATE SURGERY    . ROTATOR CUFF REPAIR Right   . STUMP REVISION Left 12/11/2016   Procedure: Revision Left Transmetatarsal Amputation, Apply Wound VAC;  Surgeon: Richard Minion, MD;  Location: [place];  Service: Orthopedics;  Laterality: Left;  . TOTAL KNEE ARTHROPLASTY Right     Family History  Problem Relation Age of Onset  . Heart disease Father   . Heart attack Father   . Hypertension Father   . Heart disease Brother   . Heart disease Paternal Grandfather   . Stroke Mother   . Parkinsonism Sister   . Heart disease Sister   . Dementia Sister     Social history:  reports that he has never smoked. He has never used smokeless tobacco. He reports that he drinks about 1.2 oz of  alcohol per week . He reports that he does not use drugs.    Allergies  Allergen Reactions  . Tape Other (See Comments)    PATIENT'S SKIN IS VERY THIN AND WILL TEAR AND BRUISE VERY EASILY!! Paper take is ok    Medications:  Prior to Admission medications   Medication Sig Start Date End Date Taking? Authorizing Provider  Cholecalciferol (VITAMIN D) 2000 units CAPS Take 2,000 Units by mouth daily.   Yes [provider]  escitalopram (LEXAPRO) 5 MG tablet Take 5 mg by mouth at bedtime. 09/15/16  Yes [provider]  FIBER PO Take 1 tablet by mouth 3 (three) times daily.    Yes [provider]  fluticasone (FLONASE) 50 MCG/ACT nasal spray Place 2 sprays into both nostrils 2 (two) times daily.     Yes [provider]  furosemide (LASIX) 40 MG tablet Take 1.5 tablets (60 mg total) by mouth daily. 02/07/17 05/08/17 Yes Richard Crome, MD  guaiFENesin (MUCINEX) 600 MG 12 hr tablet Take 600 mg by mouth 2 (two) times daily as needed for to loosen phlegm.   Yes [provider]  HYDROcodone-acetaminophen (NORCO) 10-325 MG tablet Take 1 tablet by mouth every 6 (six) hours as needed (for pain). 12/16/16  Yes Bradley, Nishant, MD  metoprolol tartrate (LOPRESSOR) 25 MG tablet Take 25 mg by mouth 2 (two) times daily. 01/07/17  Yes [provider]  nitroGLYCERIN (NITRODUR - DOSED IN MG/24 HR) 0.2 mg/hr patch Apply one (1) patch to the left foot every other day. 01/22/17  Yes [provider]  pantoprazole (PROTONIX) 40 MG tablet Take 1 tablet (40 mg total) by mouth daily. 10/02/16  Yes Bradley, Richard K, MD  polyethylene glycol (MIRALAX / GLYCOLAX) packet Take 17 g by mouth daily as needed for moderate constipation. For constipation Patient taking differently: Take 17 g by mouth daily as needed for mild constipation or moderate constipation.  10/02/16  Yes Bradley, Richard K, MD  potassium chloride SA (K-DUR,KLOR-CON) 20 MEQ tablet Take 1 tablet (20 mEq total) by mouth daily. Patient taking differently: Take 20 mEq by mouth daily with lunch.  10/02/16  Yes Bradley, Richard K, MD  predniSONE (DELTASONE) 10 MG tablet Take 20 mg by mouth daily.  01/14/17  Yes [provider]  vitamin B-12 (CYANOCOBALAMIN) 1000 MCG tablet Take 1,000 mcg by mouth daily with lunch.    Yes [provider]  warfarin (COUMADIN) 5 MG tablet Take as directed by coumadin clinic Patient taking differently: Take 2.5-5 mg by mouth See admin instructions. 2.5 mg in the evening on Sun/Tues/Wed/Thurs/Sat and 5 mg on Mon/Fri 07/11/16  Yes Richard Crome, MD    ROS:  Out of a complete 14 system review of symptoms, the patient complains only of the following symptoms, and all other reviewed systems are  negative.  Weakness Fatigue  Blood pressure (!) 87/58, pulse 62, height 6\' 3"  (1.905 m).  Physical Exam  General: The patient is alert and cooperative at the time of the examination.  Skin: Distal amputation of the left foot is noted.   Neurologic Exam  Mental status: The patient is alert and oriented x 3 at the time of the examination. The patient has apparent normal recent and remote memory, with an apparently normal attention span and concentration ability.   Cranial nerves: Facial symmetry is present. Speech is normal, no aphasia or dysarthria is noted. Extraocular movements are full. Visual fields are full. With superior gaze for 1 minute,  no divergence of gaze or subjective double vision was noted, no ptosis was seen.  Motor: The patient has good strength in all 4 extremities. With arms outstretched 1 minute, no fatigable weakness of the deltoid muscles was noted.  Sensory examination: Soft touch sensation is symmetric on the face, arms, and legs.  Coordination: The patient has good finger-nose-finger and heel-to-shin bilaterally.  Gait and station: The patient is nonambulatory, he is wheelchair-bound.  Reflexes: Deep tendon reflexes are symmetric, but are depressed.   Assessment/Plan:  1. Myasthenia gravis  The patient is doing well on 20 mg dose of prednisone, we will reduce the dose to 17.5 mg, a prescription for prednisone was called in. The patient will follow-up in about 5 months, he will call me if he is not doing well.  Jill Alexanders MD 03/07/2017 8:24 AM  [facility] [address] [place], [place] [address]  Phone [phone] Fax [phone]

## 2017-03-07 NOTE — Telephone Encounter (Signed)
New message      Pt c/o BP issue: STAT if pt c/o blurred vision, one-sided weakness or slurred speech  1. What are your last 5 BP readings?  89/54  2. Are you having any other symptoms (ex. Dizziness, headache, blurred vision, passed out)?  Dizziness and sob---using oxygen more  3. What is your BP issue?  bp is too low

## 2017-03-07 NOTE — Progress Notes (Signed)
[place] for warfarin Indication: atrial fibrillation   Assessment: 23 yom with hx of afib on warfarin PTA. Pharmacy consulted to dose while inpatient. INR therapeutic at 2.19 on admit. Hg 11.2, plt wnl on admit. No bleed documented.  PTA warfarin dose: 2.5mg  daily except 5mg  on Mon/Fri (last dose 03/06/17 PTA)  Goal of Therapy:  INR 2-3 Monitor platelets by anticoagulation protocol: Yes   Plan:  Warfarin 5mg  PO x 1 dose tonight Daily INR Monitor CBC, s/sx bleeding  Elicia Lamp, PharmD, BCPS Clinical Pharmacist 03/07/2017 8:19 PM

## 2017-03-07 NOTE — ED Notes (Signed)
Upon assessing pts iv site, arm appeared bruised (pt on coumadin) with mild edema around site of catheter. Iv removed with catheter intact. Pharmacy made aware and advised to use heat or ice on site and to elevate pts arm.

## 2017-03-07 NOTE — Progress Notes (Addendum)
History and Physical    Richard Bradley RSW:[id number] DOB: [birth year] DOA: 03/07/2017  PCP: Josetta Huddle, MD  Patient coming from: Home  Chief Complaint: Fatigue  HPI: Richard Bradley is a 83 y.o. male with medical history significant of myasthenia gravis, prostate cancer s/p treatment, GERD, CHF (EF of 35-40% noted in February), Afib on coumadin, PVD who presents with fatigue and worsening cellulitis.  The patient's daughter is in the room who helps with the history.  Richard Bradley has been dealing with cellulitis off an on again in the right leg for about the last 6 months.  He has had 4 courses of antibiotics per his daughter, the last being given here after discharge he was treated with a course of ciprofloxacin.  She also notes history of being treated with keflex.  She notes that for the last 24 hours he has been more lethargic, confused off an on and having a low blood pressure.  She took him to his PCP today and he was noted to have a low grade fever, but has not been febrile here in the ED.  Further symptoms include clammy skin, decreased PO intake for the last 2 days and lack of hunger (which is abnormal for him).  He has been NWB since an amputation in February.  This AM, his daughter noted his O2 saturation was 84% and she placed him on his PRN oxygen.  On questioning, he notes some SOB and coughing with sputum production, but only once and he cannot remember when.  Otherwise, he denies any pain, chest pain, abdominal pain, nausea, vomiting, dysphagia, blurry vision.  He has a history of MG which has been well controlled.  He is on chronic steroids, but saw his neurologist this morning who decreased his steroids to 17.5mg  daily because of his good control.  He has been NWB, but has caretakers who have been ensuring no pressure ulcers per daughter.   ED Course: In the ED, he was found to have a WBC of 11.8 (on chronic steroids), Cr of 1.18 (baseline around 0.8) with a BUN of 23, lactic acid of  2.85.  UA was unremarkable.  CXR did not show a pneumonia.  He was noted to have worsening LE cellulitis and admission was requested.   Review of Systems: As per HPI otherwise 10 point review of systems negative.   Past Medical History:  Diagnosis Date  . Atrial fibrillation ([place])   . Cellulitis    left arm  . CHF (congestive heart failure) ([place])   . Dyspnea    oxygen prn  . Dysrhythmia    afib  . GERD (gastroesophageal reflux disease)    ocassional  . History of kidney stones    passed  . Myasthenia gravis   . Osteoarthritis   . Pneumonia    Hstory of  . 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 Prostate cancer ([place]) dx'd 1982   treated with surgery and radiation.  Basal cell cancer- many  . Sepsis ([place])   . Urine incontinence   . Varicose veins of left lower extremity     Past Surgical History:  Procedure Laterality Date  . AMPUTATION Left 03/20/2016   Procedure: Left Great Toe Amputation at Metatarsophalangeal Joint;  Surgeon: Newt Minion, MD;  Location: [place];  Service: Orthopedics;  Laterality: Left;  . AMPUTATION Left 06/11/2016   Procedure: Left 2nd Toe Amputation;  Surgeon: Newt Minion, MD;  Location: [place];  Service: Orthopedics;  Laterality: Left;  .  AMPUTATION Left 07/12/2016   Procedure: LEFT 3rd TOE AMPUTATION;  Surgeon: Newt Minion, MD;  Location: [place];  Service: Orthopedics;  Laterality: Left;  . AMPUTATION Left 09/11/2016   Procedure: Left Transmetatarsal Amputation;  Surgeon: Newt Minion, MD;  Location: [place];  Service: Orthopedics;  Laterality: Left;  . appendectomy    . APPENDECTOMY    . COLONOSCOPY    . HERNIA REPAIR Bilateral    Inguinal  . I&D EXTREMITY Left 10/12/2014   Procedure: IRRIGATION AND DEBRIDEMENT INDEX FINGER;  Surgeon: Iran Planas, MD;  Location: [place];  Service: Orthopedics;  Laterality: Left;  . KYPHOPLASTY  2012 ?   T 12- L1  . LAMINECTOMY  2005 ?   lumbar- 4-5  . PROSTATE SURGERY    . ROTATOR CUFF REPAIR  Right   . STUMP REVISION Left 12/11/2016   Procedure: Revision Left Transmetatarsal Amputation, Apply Wound VAC;  Surgeon: Newt Minion, MD;  Location: [place];  Service: Orthopedics;  Laterality: Left;  . TOTAL KNEE ARTHROPLASTY Right    I reviewed with the patient.   reports that he has never smoked. He has never used smokeless tobacco. He reports that he drinks about 1.2 oz of alcohol per week . He reports that he does not use drugs.  Allergies  Allergen Reactions  . Tape Other (See Comments)    PATIENT'S SKIN IS VERY THIN AND WILL TEAR AND BRUISE VERY EASILY!! Paper take is ok   Reviewed.  Family History  Problem Relation Age of Onset  . Heart disease Father   . Heart attack Father   . Hypertension Father   . Heart disease Brother   . Heart disease Paternal Grandfather   . Stroke Mother   . Parkinsonism Sister   . Heart disease Sister   . Dementia Sister     Prior to Admission medications   Medication Sig Start Date End Date Taking? Authorizing Provider  Cholecalciferol (VITAMIN D) 2000 units CAPS Take 2,000 Units by mouth daily.   Yes [provider]  escitalopram (LEXAPRO) 5 MG tablet Take 5 mg by mouth at bedtime. 09/15/16  Yes [provider]  FIBER PO Take 1 tablet by mouth 3 (three) times daily.    Yes [provider]  fluticasone (FLONASE) 50 MCG/ACT nasal spray Place 2 sprays into both nostrils 2 (two) times daily.    Yes [provider]  furosemide (LASIX) 40 MG tablet Take 1.5 tablets (60 mg total) by mouth daily. 02/07/17 05/08/17 Yes Belva Crome, MD  guaiFENesin (MUCINEX) 600 MG 12 hr tablet Take 600 mg by mouth at bedtime as needed for to loosen phlegm.    Yes [provider]  HYDROcodone-acetaminophen (NORCO) 10-325 MG tablet Take 1 tablet by mouth every 6 (six) hours as needed (for pain). 12/16/16  Yes Dhungel, Nishant, MD  metoprolol tartrate (LOPRESSOR) 25 MG tablet Take 25 mg by mouth 2 (two) times daily. 01/07/17   Yes [provider]  nitroGLYCERIN (NITRODUR - DOSED IN MG/24 HR) 0.2 mg/hr patch Apply one (1) patch to the left foot every other day. 01/22/17  Yes [provider]  pantoprazole (PROTONIX) 40 MG tablet Take 1 tablet (40 mg total) by mouth daily. 10/02/16  Yes Rai, Ripudeep K, MD  polyethylene glycol (MIRALAX / GLYCOLAX) packet Take 17 g by mouth daily as needed for moderate constipation. For constipation Patient taking differently: Take 17 g by mouth daily as needed for mild constipation or moderate constipation.  10/02/16  Yes Rai, Ripudeep K, MD  potassium chloride SA (K-DUR,KLOR-CON) 20 MEQ tablet Take 1 tablet (20 mEq total) by mouth daily. Patient taking differently: Take 20 mEq by mouth daily with lunch.  10/02/16  Yes Rai, Ripudeep K, MD  predniSONE (DELTASONE) 20 MG tablet Take 20 mg by mouth daily with breakfast.   Yes [provider]  vitamin B-12 (CYANOCOBALAMIN) 1000 MCG tablet Take 1,000 mcg by mouth daily with lunch.    Yes [provider]  warfarin (COUMADIN) 5 MG tablet Take as directed by coumadin clinic Patient taking differently: Take 2.5-5 mg by mouth See admin instructions. 2.5 mg in the evening on Sun/Tues/Wed/Thurs/Sat and 5 mg on Mon/Fri 07/11/16  Yes Belva Crome, MD  predniSONE (DELTASONE) 5 MG tablet Take 1.5 tablets (7.5 mg total) by mouth daily with breakfast. Patient not taking: Reported on 03/07/2017 03/07/17   Kathrynn Ducking, MD    Physical Exam: Vitals:   03/07/17 1851 03/07/17 1900 03/07/17 1915 03/07/17 1930  BP: (!) 118/53  96/68 96/65  Pulse:      Resp:      Temp:  (S) 100.1 F (37.8 C)    TempSrc:  Rectal    SpO2: 93%     Weight:        Constitutional: Lying in bed, fatigued, elderly gentleman.  Falling asleep between questions.  Vitals:   03/07/17 1851 03/07/17 1900 03/07/17 1915 03/07/17 1930  BP: (!) 118/53  96/68 96/65  Pulse:      Resp:      Temp:  (S) 100.1 F (37.8 C)    TempSrc:  Rectal    SpO2: 93%      Weight:       Eyes: He has bilateral ptosis, but able to open eyes when speaking.  ENMT: Mucous membranes are mildly dry.  Neck: normal, supple Respiratory: He has some mild crackles in the right base, but otherwise clear, no wheezing.  Cardiovascular: + holosystolic murmur, best heard at LUSB.  Tachycardic rate, regular rhythm.  Abdomen: no tenderness, no masses palpated. +BS Musculoskeletal: He has very thin limbs with varicosities in the bilateral LE.  He is s/p transmetatarsal amputation of the left foot.  There is a healing wound covered with a bandaid, no drainage or foul smell.   Skin: He has erythema of the right leg from ankle to the knee.  This is worsening over the last 2 days.  He has no areas of skin breakdown or pus.  He has significant bruising of the skins of the upper arms and skin tear on the right forearm.  He has some mild blanching redness at gluteal cleft, but otherwise skin on the back is intact.  Neurologic: He has ptosis and is sleepy, but answers questions.  He is aware of his daughter and what is going on.  He is able to move all extremities.   Psychiatric: Fatigued, sleepy.  Oriented to person, place.    Labs on Admission: I have personally reviewed following labs and imaging studies  CBC:  Recent Labs Lab 03/07/17 1520  WBC 11.8*  NEUTROABS 10.7*  HGB 11.2*  HCT 35.5*  MCV 93.2  PLT 761   Basic Metabolic Panel:  Recent Labs Lab 03/07/17 1520  NA 136  K 4.4  CL 101  CO2 26  GLUCOSE 151*  BUN 23*  CREATININE 1.18  CALCIUM 8.4*   GFR: Estimated Creatinine Clearance: 46.3 mL/min (by C-G formula based on SCr of 1.18 mg/dL). Liver  Function Tests:  Recent Labs Lab 03/07/17 1520  AST 32  ALT 19  ALKPHOS 52  BILITOT 1.0  PROT 5.5*  ALBUMIN 3.3*   No results for input(s): LIPASE, AMYLASE in the last 168 hours. No results for input(s): AMMONIA in the last 168 hours. Coagulation Profile:  Recent Labs Lab 03/06/17 1129 03/07/17 1520    INR 2.6 2.19   Cardiac Enzymes: No results for input(s): CKTOTAL, CKMB, CKMBINDEX, TROPONINI in the last 168 hours. BNP (last 3 results) No results for input(s): PROBNP in the last 8760 hours. HbA1C: No results for input(s): HGBA1C in the last 72 hours. CBG: No results for input(s): GLUCAP in the last 168 hours. Lipid Profile: No results for input(s): CHOL, HDL, LDLCALC, TRIG, CHOLHDL, LDLDIRECT in the last 72 hours. Thyroid Function Tests: No results for input(s): TSH, T4TOTAL, FREET4, T3FREE, THYROIDAB in the last 72 hours. Anemia Panel: No results for input(s): VITAMINB12, FOLATE, FERRITIN, TIBC, IRON, RETICCTPCT in the last 72 hours. Urine analysis:    Component Value Date/Time   COLORURINE YELLOW 03/07/2017 [place] 03/07/2017 1633   LABSPEC 1.014 03/07/2017 1633   PHURINE 6.0 03/07/2017 1633   GLUCOSEU NEGATIVE 03/07/2017 1633   HGBUR MODERATE (A) 03/07/2017 1633   BILIRUBINUR NEGATIVE 03/07/2017 1633   KETONESUR NEGATIVE 03/07/2017 1633   PROTEINUR NEGATIVE 03/07/2017 1633   NITRITE NEGATIVE 03/07/2017 1633   LEUKOCYTESUR NEGATIVE 03/07/2017 1633    Radiological Exams on Admission: Dg Chest Port 1 View  Result Date: 03/07/2017 CLINICAL DATA:  Hypoxia. EXAM: PORTABLE CHEST 1 VIEW COMPARISON:  Radiographs of Dec 12, 2016. FINDINGS: Stable cardiomegaly. Stable biapical calcifications. No pneumothorax or pleural effusion is noted. No acute pulmonary disease is noted. Bony thorax is unremarkable. IMPRESSION: No acute cardiopulmonary abnormality seen. Electronically Signed   By: Marijo Conception, M.D.   On: 03/07/2017 15:51    EKG: Independently reviewed. Low voltage.  PVCs.  Difficult to interpret given baseline.    Assessment/Plan Sepsis due to cellulitis - Source appears to worsening non purulent cellulitis on leg, but this is also chronic and recurrent for the last 6 months.  He has been on multiple courses of Abx - He has fever (reported),  tachycardia, hypotension, elevated WBC (chronic due to steroids) - Started on Vancomycin and Zosyn.  I am not sure he will need prolonged zosyn given non purulent nature and patient is not diabetic, but he is immunocompromised on > 35 years of prednisone - For Low BP, he has received about 1 L of fluids in the ED, BP is sustaining in the low 448J systolic.  He has systolic CHF, so fluids will need to be given judiciously.  Started on NS at 75cc/hr for 8 hours only - SDU overnight, may be an early transfer to the floor depending on how he does.  - Hold antihypertensives and lasix for now, restart when needed - Pulse ox monitoring - Telemetry given Afib - Oxygen to keep O2 sats > 90% - Trend lactic acid, last 2.84 - BC X 2 pending - UA did not appear infected, he has no symptoms.  UC sent from ED.   AKI - Mild elevated of Cr and BUN - Cr usually around 0.8, most recently 1.0 - IVF as noted above - Monitor for improvement - Likely related to decreased PO intake.     Chronic atrial fibrillation Anticoagulated on Coumadin - INR 2.19 - Pharmacy consult for coumadin dosing - He is on metoprolol, this is held  overnight given sepsis.  - Restart metoprolol when appropriate    Myasthenia gravis  - He is doing well, seen by Neurology this AM and noted to be doing well.  He does not have worsening weakness, dysphagia, blurry vision - continue current prednisone dose, 17.5mg  daily - I considered stress dose steroids, but his BP has responded very well to fluids alone.  Will monitor.  Start stress dose steroids if BP worsens or clinically worsens.     Benign essential HTN - BP low, holding home BP medications including lasix, metoprolol while BP is low - Restart medications as appropriate.     Chronic systolic CHF (congestive heart failure)  - Last TTE with EF of 51-83%, this complicates fluid management - His BP appears to be doing better with 1 L of fluids, I have started IVF at 75cc/hr for  just a few hours - Restart lasix and metoprolol when appropriate     S/P transmetatarsal amputation of foot, left  - Chronically open, no pus, appears to be healing - Continue PRN norco  GERD - Continue home protonix   DVT prophylaxis: On home coumadin Code Status: Partial, would only be interested in medications if needed, no CPR, intubation Family Communication: DAughter, [name] at bedside and daughter in law Disposition Plan: Admit for monitoring Consults called: None, consider ID given long course of this infection Admission status: SDU  Gilles Chiquito MD [facility] Pager [phone]  If 7PM-7AM, please contact night-coverage [email or web address] Password [email or web address]  03/07/2017, 8:08 PM

## 2017-03-08 DIAGNOSIS — A419 Sepsis, unspecified organism: Principal | ICD-10-CM

## 2017-03-08 DIAGNOSIS — I482 Chronic atrial fibrillation: Secondary | ICD-10-CM

## 2017-03-08 DIAGNOSIS — I5022 Chronic systolic (congestive) heart failure: Secondary | ICD-10-CM

## 2017-03-08 DIAGNOSIS — Z89432 Acquired absence of left foot: Secondary | ICD-10-CM

## 2017-03-08 DIAGNOSIS — Z5181 Encounter for therapeutic drug level monitoring: Secondary | ICD-10-CM

## 2017-03-08 DIAGNOSIS — L03115 Cellulitis of right lower limb: Secondary | ICD-10-CM

## 2017-03-08 DIAGNOSIS — G7000 Myasthenia gravis without (acute) exacerbation: Secondary | ICD-10-CM

## 2017-03-08 DIAGNOSIS — Z7901 Long term (current) use of anticoagulants: Secondary | ICD-10-CM

## 2017-03-08 LAB — COMPREHENSIVE METABOLIC PANEL
ALT: 14 U/L — ABNORMAL LOW (ref 17–63)
ANION GAP: 7 (ref 5–15)
AST: 19 U/L (ref 15–41)
Albumin: 3 g/dL — ABNORMAL LOW (ref 3.5–5.0)
Alkaline Phosphatase: 45 U/L (ref 38–126)
BUN: 15 mg/dL (ref 6–20)
CHLORIDE: 105 mmol/L (ref 101–111)
CO2: 27 mmol/L (ref 22–32)
Calcium: 8.3 mg/dL — ABNORMAL LOW (ref 8.9–10.3)
Creatinine, Ser: 0.98 mg/dL (ref 0.61–1.24)
Glucose, Bld: 99 mg/dL (ref 65–99)
POTASSIUM: 3.4 mmol/L — AB (ref 3.5–5.1)
SODIUM: 139 mmol/L (ref 135–145)
Total Bilirubin: 1.9 mg/dL — ABNORMAL HIGH (ref 0.3–1.2)
Total Protein: 5.5 g/dL — ABNORMAL LOW (ref 6.5–8.1)

## 2017-03-08 LAB — PROTIME-INR
INR: 2.12
Prothrombin Time: 24 seconds — ABNORMAL HIGH (ref 11.4–15.2)

## 2017-03-08 LAB — CBC
HEMATOCRIT: 34.6 % — AB (ref 39.0–52.0)
Hemoglobin: 10.7 g/dL — ABNORMAL LOW (ref 13.0–17.0)
MCH: 28.5 pg (ref 26.0–34.0)
MCHC: 30.9 g/dL (ref 30.0–36.0)
MCV: 92 fL (ref 78.0–100.0)
Platelets: 148 10*3/uL — ABNORMAL LOW (ref 150–400)
RBC: 3.76 MIL/uL — AB (ref 4.22–5.81)
RDW: 15.7 % — ABNORMAL HIGH (ref 11.5–15.5)
WBC: 9 10*3/uL (ref 4.0–10.5)

## 2017-03-08 LAB — URINE CULTURE

## 2017-03-08 LAB — GLUCOSE, CAPILLARY: Glucose-Capillary: 88 mg/dL (ref 65–99)

## 2017-03-08 LAB — MRSA PCR SCREENING: MRSA by PCR: NEGATIVE

## 2017-03-08 MED ORDER — WARFARIN SODIUM 5 MG PO TABS
2.5000 mg | ORAL_TABLET | Freq: Once | ORAL | Status: AC
  Administered 2017-03-08: 2.5 mg via ORAL
  Filled 2017-03-08: qty 1

## 2017-03-08 MED ORDER — ORAL CARE MOUTH RINSE
15.0000 mL | Freq: Two times a day (BID) | OROMUCOSAL | Status: DC
  Administered 2017-03-08: 15 mL via OROMUCOSAL

## 2017-03-08 MED ORDER — VANCOMYCIN HCL IN DEXTROSE 750-5 MG/150ML-% IV SOLN
750.0000 mg | Freq: Two times a day (BID) | INTRAVENOUS | Status: DC
  Administered 2017-03-09: 750 mg via INTRAVENOUS
  Filled 2017-03-08: qty 150

## 2017-03-08 MED ORDER — HYDROCORTISONE NA SUCCINATE PF 100 MG IJ SOLR
50.0000 mg | Freq: Four times a day (QID) | INTRAMUSCULAR | Status: DC
  Administered 2017-03-08 – 2017-03-09 (×3): 50 mg via INTRAVENOUS
  Filled 2017-03-08 (×3): qty 2

## 2017-03-08 NOTE — Progress Notes (Signed)
PROGRESS NOTE  Richard Bradley KKX:[id number] DOB: 16-Feb-1926 DOA: 03/07/2017 PCP: Josetta Huddle, MD  HPI/Recap of past 24 hours:  Report feeling back to normal self , denies pain, right lower extremity erythema has almost resolved, no fever Two Daughters and personal aid at bedside  Assessment/Plan: Active Problems:   Chronic atrial fibrillation (HCC)   Myasthenia gravis ([place])   Anticoagulated on Coumadin   Benign essential HTN   Sepsis ([place])   Chronic systolic CHF (congestive heart failure) (HCC)   S/P transmetatarsal amputation of foot, left ([place])   Sepsis due to cellulitis right lowe extremity, chronic left foot wound looks clean , no erythema - - He has fever (reported), tachycardia, hypotension, elevated WBC (chronic due to steroids), on presentation he has erythema entire right lower leg from ankle to knee - lactic acid on admission was 2.84, now normalized - BC X 2 pending - UA did not appear infected, he has no symptoms.  UC sent from ED.  -continue Vancomycin and Zosyn.  right lower extremity erythema has almost resolved this am - continue to Hold antihypertensives and lasix for now, restart when needed, trial of short course of stress dose steroids.   Myasthenia gravis  - He is doing well, seen by Neurology this AM and noted to be doing well.  He does not have worsening weakness, dysphagia, blurry vision - continue current prednisone dose, 17.5mg  daily - start stress dose steroids   AKI - cr 1.18 on admission, repeat 0.98 this am  Cr usually around 0.8, most recently 1.0 - now off IVF, continue hold lasix - renal dosing meds.    Benign essential HTN - BP low, holding home BP medications including lasix, metoprolol while BP is low - Restart medications as appropriate.    Chronic atrial fibrillation Anticoagulated on Coumadin - INR 2.19, - Pharmacy consult for coumadin dosing - He is on metoprolol, this is held overnight given sepsis.  - continue hold  metoprolol, bp remain low, start stress dose steroids, continue tele, heart rate around 120's      Chronic systolic CHF (congestive heart failure)  - Last TTE with EF of 16-96%, this complicates fluid management - s/p 1 L of fluids and  IVF at 75cc/hr for just a few hours, now off ivf - continue hold  lasix and metoprolol , volume status ok, likely able to resume med tomorrow if bp improves.     S/P transmetatarsal amputation of foot, left  - Chronically open, no pus, appears to be healing - Continue PRN norco  GERD - Continue home protonix   DVT prophylaxis: On home coumadin Code Status: Partial, would only be interested in medications if needed, no CPR, intubation Family Communication: DAughter, [name] at bedside and daughter in law   Disposition Plan: home hopefully in am, if culture remain negative   Consultants:  none  Procedures:  none  Antibiotics:  *   Objective: BP 118/74 (BP Location: Left Arm)   Pulse (!) 118   Temp (!) 97.3 F (36.3 C) (Axillary)   Resp (!) 24   Ht 6\' 3"  (1.905 m)   Wt 80.1 kg (176 lb 9.4 oz)   SpO2 97%   BMI 22.07 kg/m   Intake/Output Summary (Last 24 hours) at 03/08/17 0815 Last data filed at 03/08/17 0730  Gross per 24 hour  Intake             1575 ml  Output  200 ml  Net             1375 ml   Filed Weights   03/07/17 1541 03/08/17 0600  Weight: 77.1 kg (170 lb) 80.1 kg (176 lb 9.4 oz)    Exam: Patient is examined daily including today on 03/08/2017, exam remain the same as of yesterday except that is highlighted below   General:  Frail elderly, aaox3, NAD  Cardiovascular: IRRR, tachcyardia  Respiratory: CTABL  Abdomen: Soft/ND/NT, positive BS  Musculoskeletal: No Edema, right lower extremity erythema has largely resolved, left foot wound inspected, does not appear infected at all  Neuro: aaox3  Data Reviewed: Basic Metabolic Panel:  Recent Labs Lab 03/07/17 1520 03/08/17 0546  NA 136 139   K 4.4 3.4*  CL 101 105  CO2 26 27  GLUCOSE 151* 99  BUN 23* 15  CREATININE 1.18 0.98  CALCIUM 8.4* 8.3*   Liver Function Tests:  Recent Labs Lab 03/07/17 1520 03/08/17 0546  AST 32 19  ALT 19 14*  ALKPHOS 52 45  BILITOT 1.0 1.9*  PROT 5.5* 5.5*  ALBUMIN 3.3* 3.0*   No results for input(s): LIPASE, AMYLASE in the last 168 hours. No results for input(s): AMMONIA in the last 168 hours. CBC:  Recent Labs Lab 03/07/17 1520 03/08/17 0546  WBC 11.8* 9.0  NEUTROABS 10.7*  --   HGB 11.2* 10.7*  HCT 35.5* 34.6*  MCV 93.2 92.0  PLT 157 148*   Cardiac Enzymes:   No results for input(s): CKTOTAL, CKMB, CKMBINDEX, TROPONINI in the last 168 hours. BNP (last 3 results)  Recent Labs  09/26/16 1105  BNP 624.0*    ProBNP (last 3 results) No results for input(s): PROBNP in the last 8760 hours.  CBG:  Recent Labs Lab 03/08/17 0716  GLUCAP 88    No results found for this or any previous visit (from the past 240 hour(s)).   Studies: Dg Chest Port 1 View  Result Date: 03/07/2017 CLINICAL DATA:  Hypoxia. EXAM: PORTABLE CHEST 1 VIEW COMPARISON:  Radiographs of Dec 12, 2016. FINDINGS: Stable cardiomegaly. Stable biapical calcifications. No pneumothorax or pleural effusion is noted. No acute pulmonary disease is noted. Bony thorax is unremarkable. IMPRESSION: No acute cardiopulmonary abnormality seen. Electronically Signed   By: Marijo Conception, M.D.   On: 03/07/2017 15:51    Scheduled Meds: . cholecalciferol  2,000 Units Oral Daily  . escitalopram  5 mg Oral QHS  . fluticasone  2 spray Each Nare BID  . mouth rinse  15 mL Mouth Rinse BID  . pantoprazole  40 mg Oral Daily  . potassium chloride SA  20 mEq Oral Daily  . predniSONE  17.5 mg Oral Q breakfast  . sodium chloride flush  3 mL Intravenous Q12H  . Warfarin - Pharmacist Dosing Inpatient   Does not apply q1800    Continuous Infusions: . piperacillin-tazobactam (ZOSYN)  IV Stopped (03/08/17 0439)  . vancomycin  750 mg (03/08/17 0500)     Time spent: 35 mins I have personally reviewed and interpreted daily labs, tele strips, imagings as discussed above under date review session and assessment and plans. I have reviewed outside record and past hospitalization records Plans of care discussed with family and personal care giver at bedside at length.   Madalyn Legner MD, PhD  [facility] Pager [phone]. If 7PM-7AM, please contact night-coverage at [email or web address], password [facility] 03/08/2017, 8:15 AM  LOS: 1 day

## 2017-03-08 NOTE — Progress Notes (Signed)
[facility] for warfarin Indication: atrial fibrillation   Assessment: 22 yom with hx of afib on warfarin PTA. Pharmacy consulted to dose while inpatient. INR therapeutic at 2.19 on admit.  INR therapeutic today  PTA warfarin dose: 2.5mg  daily except 5mg  on Mon/Fri (last dose 03/06/17 PTA)  Goal of Therapy:  INR 2-3 Monitor platelets by anticoagulation protocol: Yes   Plan:  Warfarin 2.5mg  PO x 1 dose tonight Daily INR Monitor CBC, s/sx bleeding  Thank you Anette Guarneri, PharmD [phone] 03/08/2017 11:00 AM

## 2017-03-08 NOTE — ED Notes (Signed)
Patient placed on hospital bed, given warm blankets and placed on cardiac monitor.

## 2017-03-08 NOTE — ED Notes (Signed)
Attempted report 

## 2017-03-09 ENCOUNTER — Inpatient Hospital Stay (HOSPITAL_COMMUNITY): Payer: Medicare Other

## 2017-03-09 DIAGNOSIS — I959 Hypotension, unspecified: Secondary | ICD-10-CM

## 2017-03-09 DIAGNOSIS — D899 Disorder involving the immune mechanism, unspecified: Secondary | ICD-10-CM

## 2017-03-09 LAB — CBC WITH DIFFERENTIAL/PLATELET
Basophils Absolute: 0 10*3/uL (ref 0.0–0.1)
Basophils Relative: 0 %
EOS ABS: 0 10*3/uL (ref 0.0–0.7)
EOS PCT: 0 %
HCT: 33.2 % — ABNORMAL LOW (ref 39.0–52.0)
Hemoglobin: 10.5 g/dL — ABNORMAL LOW (ref 13.0–17.0)
LYMPHS ABS: 0.4 10*3/uL — AB (ref 0.7–4.0)
Lymphocytes Relative: 5 %
MCH: 29.4 pg (ref 26.0–34.0)
MCHC: 31.6 g/dL (ref 30.0–36.0)
MCV: 93 fL (ref 78.0–100.0)
MONO ABS: 0.4 10*3/uL (ref 0.1–1.0)
Monocytes Relative: 5 %
Neutro Abs: 8 10*3/uL — ABNORMAL HIGH (ref 1.7–7.7)
Neutrophils Relative %: 90 %
PLATELETS: 159 10*3/uL (ref 150–400)
RBC: 3.57 MIL/uL — AB (ref 4.22–5.81)
RDW: 16 % — AB (ref 11.5–15.5)
WBC: 8.8 10*3/uL (ref 4.0–10.5)

## 2017-03-09 LAB — BASIC METABOLIC PANEL
Anion gap: 9 (ref 5–15)
BUN: 16 mg/dL (ref 6–20)
CALCIUM: 8.2 mg/dL — AB (ref 8.9–10.3)
CO2: 23 mmol/L (ref 22–32)
CREATININE: 0.91 mg/dL (ref 0.61–1.24)
Chloride: 106 mmol/L (ref 101–111)
GFR calc Af Amer: >60 mL/min (ref >60)
Glucose, Bld: 138 mg/dL — ABNORMAL HIGH (ref 65–99)
POTASSIUM: 3.7 mmol/L (ref 3.5–5.1)
SODIUM: 138 mmol/L (ref 135–145)

## 2017-03-09 LAB — GLUCOSE, CAPILLARY: GLUCOSE-CAPILLARY: 159 mg/dL — AB (ref 65–99)

## 2017-03-09 LAB — MAGNESIUM: MAGNESIUM: 1.9 mg/dL (ref 1.7–2.4)

## 2017-03-09 LAB — PROTIME-INR
INR: 2.19
Prothrombin Time: 24.7 seconds — ABNORMAL HIGH (ref 11.4–15.2)

## 2017-03-09 MED ORDER — METOPROLOL TARTRATE 5 MG/5ML IV SOLN
2.5000 mg | INTRAVENOUS | Status: DC | PRN
  Administered 2017-03-09: 2.5 mg via INTRAVENOUS
  Filled 2017-03-09: qty 5

## 2017-03-09 MED ORDER — SACCHAROMYCES BOULARDII 250 MG PO CAPS: 250.0000 mg | ORAL_CAPSULE | Freq: Two times a day (BID) | ORAL | 0 refills | Status: DC

## 2017-03-09 MED ORDER — CIPROFLOXACIN HCL 500 MG PO TABS: 500.0000 mg | ORAL_TABLET | Freq: Two times a day (BID) | ORAL | 0 refills | Status: AC

## 2017-03-09 MED ORDER — PREDNISONE 10 MG PO TABS: 10.0000 mg | ORAL_TABLET | Freq: Every day | ORAL | 0 refills | Status: DC

## 2017-03-09 MED ORDER — SACCHAROMYCES BOULARDII 250 MG PO CAPS
250.0000 mg | ORAL_CAPSULE | Freq: Two times a day (BID) | ORAL | Status: DC
  Administered 2017-03-09: 250 mg via ORAL
  Filled 2017-03-09: qty 1

## 2017-03-09 MED ORDER — CIPROFLOXACIN HCL 500 MG PO TABS
500.0000 mg | ORAL_TABLET | Freq: Two times a day (BID) | ORAL | Status: DC
  Administered 2017-03-09: 500 mg via ORAL
  Filled 2017-03-09: qty 1

## 2017-03-09 NOTE — Discharge Instructions (Signed)
Antibiotic Medicine, Adult Antibiotic medicines are used to treat infections caused by bacteria, such as strep throat and urinary tract infection (UTI). Antibiotic medicines will not work for viral illnesses, such as colds or the flu (influenza). They work by killing the bacteria that is making you sick. Antibiotics can also have serious side effects. It is important that you take antibiotic medicines safely and only when needed. When do I need to take antibiotics? Antibiotics are medicines that treat bacterial infections. You may need antibiotics for:  UTI.  Strep throat.  Meningitis. This infection affects the spinal cord and brain.  Bacterial sinusitis.  Serious lung infection.  You may start antibiotics while your health care provider waits for test results to come back. Common tests may include throat, urine, blood, or mucus culture. Your health care provider may change or stop the antibiotic depending on your test results. When are antibiotics not needed? You do not need antibiotics for most common illnesses. These illnesses may be caused by a virus, not a bacteria. You do not need antibiotics for:  The common cold.  Influenza.  Sore throat.  Discolored mucus.  Bronchitis.  Antibiotics are not always needed for all bacterial infections. Many of these infections clear up without antibiotic treatment. Do not ask for or take antibiotics when they are not necessary. How long should I take the antibiotic? You must take the entire prescription. Continue to take your antibiotic for as long as told by your health care provider. Do not stop taking it even if you start to feel better. If you stop taking it too soon:  You may start to feel sick again.  Your infection may become harder to treat.  Complications may develop.  Each course of antibiotics needs a different amount of time to work. Some antibiotic courses last only a few days. Some last about a week to 10 days. In some  cases, you may need to take antibiotics for a few weeks to completely treat the infection. What if I miss a dose? Try not to miss any doses of medicine. If you miss a dose, call your health care provider or pharmacist for advice. Sometimes it is okay to take the missed dose as soon as possible. What are the risks of taking antibiotics? Most antibiotics can cause an infection called Clostridium difficile (C. difficile), which causes severe diarrhea. This infection happens when the antibiotics kill the healthy bacteria in your intestines. This allows C. difficile to grow. The infection needs to be treated right away. Let your health care provider know if:  You have diarrhea while taking an antibiotic.  You have diarrhea after you stop taking an antibiotic. C. difficile infection can start weeks after stopping the antibiotic.  Taking an antibiotic also puts you at risk for getting a bacteria that does not respond to medicine (antibiotic-resistant infection) in the future. Antibiotics can cause bacteria to change so that if the antibiotic is taken again, the medicine is not able to kill the bacteria. These infections can be more serious and, in some cases, life-threatening. Do antibiotics affect birth control? Birth control pills may not work while you are on antibiotics. If you are taking birth control pills, continue taking them as usual and use a second form of birth control, such as a condom, to avoid unwanted pregnancy. Continue using the second form of birth control until your health care provider says you can stop. What else should I know about taking antibiotics? It is important for you to  take antibiotics exactly as told. Make sure that you:  Take the entire course of antibiotic that was prescribed. Do not stop taking your antibiotics even if your symptoms improve.  Take the correct amount of medicine each day.  Ask your health care provider: ? How long to wait in between doses. ? If the  antibiotic should be taken with food. ? If there are any foods, drinks, or medicines that you should avoid while taking the antibiotics. ? If there are any side effects you should be aware of.  Only use the antibiotics prescribed for you by your health care provider. Do not use antibiotics prescribed for someone else.  Drink a large glass of water along with the antibiotics.  Ask the pharmacist for a syringe, cup, or spoon that properly measures the antibiotics.  Throw away any leftover medicine.  Contact a health care provider if:  Your symptoms get worse.  You have new joint pain or muscle aches that begin after starting the antibiotic. When should I seek immediate medical care? Seek immediate medical care if:  You have signs of a serious allergic reaction to antibiotics. If you have signs of a severe allergic reaction, stop taking the antibiotic right away. Signs may include: ? Hives, which are raised, itchy, red bumps on the skin. ? Skin rash. ? Trouble breathing. ? A wheezing sound when you breathe. ? Swelling anywhere on your body. ? Feeling dizzy. ? Vomiting.  Your urine turns dark or becomes blood-colored.  Your skin turns yellow.  You bruise or bleed easily.  You have severe diarrhea and abdominal cramps.  You have a severe headache.  Summary  Antibiotic medicines are used to treat infections caused by bacteria, such as strep throat and UTIs. It is important that you take antibiotic medicines only when needed.  Your health care provider may change or stop the antibiotic depending on your test results.  Most antibiotics can cause an infection called Clostridium difficile (C. difficile), which causes severe diarrhea. Let your health care provider know if you develop diarrhea while taking an antibiotic.  Take the entire course of antibiotic that was prescribed. This information is not intended to replace advice given to you by your health care provider. Make sure  you discuss any questions you have with your health care provider. Document Released: 03/27/2004 Document Revised: 07/16/2016 Document Reviewed: 07/16/2016 Elsevier Interactive Patient Education  [employer].

## 2017-03-09 NOTE — Progress Notes (Signed)
Discharge instructions reviewed with daughter and with patient.  These included, but not exclusive to, the following:  Prescriptions, when to call the MD, follow-up appointments, wound dressing instructions, infection prevention, activity, diet, etc.  Comprehension of instructions ascertained via "teach-back" technique.  Patient discharged to home residence with family via private vehicle.  Escorted to exit via wheelchair accompanied by nurse tech.

## 2017-03-09 NOTE — Discharge Summary (Signed)
Discharge Summary  Richard Bradley OMV:[id number] DOB: 1926/01/26  PCP: Josetta Huddle, MD  Admit date: 03/07/2017 Discharge date: 03/09/2017  Time spent: <75mins  Recommendations for Outpatient Follow-up:  1. F/u with PMD within a week  for hospital discharge follow up, repeat cbc/bmp at follow up 2. F/u with orthopedic Dr Sharol Given in one week 3. Check INR q3days while taking abx, start from Monday.  Discharge Diagnoses:  Active Hospital Problems   Diagnosis Date Noted  . S/P transmetatarsal amputation of foot, left ([place]) 02/06/2017  . Sepsis ([place]) 12/12/2016  . Chronic systolic CHF (congestive heart failure) ([place]) 12/12/2016  . Benign essential HTN 09/21/2016  . Anticoagulated on Coumadin 12/20/2013  . Myasthenia gravis ([place]) 06/27/2011  . Chronic atrial fibrillation [facility])     Resolved Hospital Problems   Diagnosis Date Noted Date Resolved  No resolved problems to display.    Discharge Condition: stable  Diet recommendation: heart healthy  Filed Weights   03/07/17 1541 03/08/17 0600  Weight: 77.1 kg (170 lb) 80.1 kg (176 lb 9.4 oz)    History of present illness:  PCP: Josetta Huddle, MD  Patient coming from: Home  Chief Complaint: Fatigue  HPI: Richard Bradley is a 83 y.o. male with medical history significant of myasthenia gravis, prostate cancer s/p treatment, GERD, CHF (EF of 35-40% noted in February), Afib on coumadin, PVD who presents with fatigue and worsening cellulitis.  The patient's daughter is in the room who helps with the history.  Mr. Mainer has been dealing with cellulitis off an on again in the right leg for about the last 6 months.  He has had 4 courses of antibiotics per his daughter, the last being given here after discharge he was treated with a course of ciprofloxacin.  She also notes history of being treated with keflex.  She notes that for the last 24 hours he has been more lethargic, confused off an on and having a low blood pressure.  She took him to  his PCP today and he was noted to have a low grade fever, but has not been febrile here in the ED.  Further symptoms include clammy skin, decreased PO intake for the last 2 days and lack of hunger (which is abnormal for him).  He has been NWB since an amputation in February.  This AM, his daughter noted his O2 saturation was 84% and she placed him on his PRN oxygen.  On questioning, he notes some SOB and coughing with sputum production, but only once and he cannot remember when.  Otherwise, he denies any pain, chest pain, abdominal pain, nausea, vomiting, dysphagia, blurry vision.  He has a history of MG which has been well controlled.  He is on chronic steroids, but saw his neurologist this morning who decreased his steroids to 17.5mg  daily because of his good control.  He has been NWB, but has caretakers who have been ensuring no pressure ulcers per daughter.   ED Course: In the ED, he was found to have a WBC of 11.8 (on chronic steroids), Cr of 1.18 (baseline around 0.8) with a BUN of 23, lactic acid of 2.85.  UA was unremarkable.  CXR did not show a pneumonia.  He was noted to have worsening LE cellulitis and admission was requested.   Hospital Course:  Active Problems:   Chronic atrial fibrillation (HCC)   Myasthenia gravis (HCC)   Anticoagulated on Coumadin   Benign essential HTN   Sepsis (HCC)   Chronic systolic CHF (  congestive heart failure) (HCC)   S/P transmetatarsal amputation of foot, left (HCC)   Sepsis in an immunosuppressed individual who are on chronic steroids -sepsis is due to cellulitis on right lowe extremity, less likely from chronic left foot wound which is clean and has no erythema - - He has fever (reported), tachycardia, hypotension, elevated WBC (chronic due to steroids), on presentation he has erythema entire right lower leg from ankle to knee - lactic acid on admission was 2.84, now normalized - BC X 2 no growth, mrsa screening negative, urine culture insignificant  growth - home bp meds held, he received stress dose steroids, brief ivf  -he received Vancomycin and Zosyn since admission. right lower extremity erythema near completely resolved at discharge, right leg x ray no acute findings - he is discharge on cipro to finish abx treatment course , home bp meds resumed at discharge -right great toe nail half way lift off, possible port of bacteria entry cause right leg cellulitis, he is instructed to follow up with ortho Dr Sharol Given  Myasthenia Dawayne Cirri  - He is doing well, seen by Neurology this AM and noted to be doing well. He does not have worsening weakness, dysphagia, blurry vision -- he received short course of  stress dose steroids during hospitalization -continue current prednisone dose, 17.5mg  daily at discharge.  AKI on CKD III - cr 1.18 on admission,  -he received vif briefly , lasix held during hospitalization, ua no infection -cr 0.91 at time of discharge, close to baseline (around 0.8) -  renal dosing meds.  -Resume home dose lasix at discharge  Benign essential HTN - home BP medications including lasix, metoprolol held due to low bp from sepsis since admission - bp improving with treating sepsis and short course of stress dose steroids -Restart home medications at discharge   Chronic atrial fibrillation Anticoagulated on Coumadin - INR 2.19, - Pharmacy consult for coumadin dosing - He is on metoprolol, this is held since admission given sepsis.  - heart rate around 120's Resume home meds metoprolol, patient to have INR checked q3days start from Monday while on abx   Chronic systolic CHF (congestive heart failure)  - Last TTE with EF of 35-40% - s/p 1 L of fluids and  IVF at 75cc/hr for just a few hours initial hospitalization due to sepsis - home meds lasix and metoprolol held since admission due to sepsis and hypotension,  -euvolemic at discharge, he is discharged on home dose lasix and betablocker.    S/P  transmetatarsal amputation of foot, left  - Chronically open, no pus, appears to be healing - Continue PRN norco -follow with orthopedics  GERD - Continue home protonix   DVT prophylaxis:On home coumadin Code Status:Partial, would only be interested in medications if needed, no CPR, intubation Family Communication:DAughter, [name] at bedside and daughter in Sports coach   Disposition Plan: home    Consultants:  none  Procedures:  none  Antibiotics:  Vanc/zosyn since admission   Discharge Exam: BP 119/75 (BP Location: Left Arm)   Pulse (!) 118   Temp 98.2 F (36.8 C) (Oral)   Resp (!) 27   Ht 6\' 3"  (1.905 m)   Wt 80.1 kg (176 lb 9.4 oz)   SpO2 99%   BMI 22.07 kg/m   General: NAD, AAOX3 , very pleasant Cardiovascular: IRRR Respiratory: CTABL Extremities: chronic contractures ( bilateral hands from MG), no edema, right leg erythema has completely resolved, right bid toe nail half way lift off,  left  foot chronic wound clean Skin: ecchymosis  Discharge Instructions You were cared for by a hospitalist during your hospital stay. If you have any questions about your discharge medications or the care you received while you were in the hospital after you are discharged, you can call the unit and asked to speak with the hospitalist on call if the hospitalist that took care of you is not available. Once you are discharged, your primary care physician will handle any further medical issues. Please note that NO REFILLS for any discharge medications will be authorized once you are discharged, as it is imperative that you return to your primary care physician (or establish a relationship with a primary care physician if you do not have one) for your aftercare needs so that they can reassess your need for medications and monitor your lab values.  Discharge Instructions    Diet - low sodium heart healthy    Complete by:  As directed    Increase activity slowly    Complete by:   As directed      Allergies as of 03/09/2017      Reactions   Tape Other (See Comments)   PATIENT'S SKIN IS VERY THIN AND WILL TEAR AND BRUISE VERY EASILY!! Paper take is ok      Medication List    TAKE these medications   ciprofloxacin 500 MG tablet Commonly known as:  CIPRO Take 1 tablet (500 mg total) by mouth 2 (two) times daily.   escitalopram 5 MG tablet Commonly known as:  LEXAPRO Take 5 mg by mouth at bedtime.   FIBER PO Take 1 tablet by mouth 3 (three) times daily.   fluticasone 50 MCG/ACT nasal spray Commonly known as:  FLONASE Place 2 sprays into both nostrils 2 (two) times daily.   furosemide 40 MG tablet Commonly known as:  LASIX Take 1.5 tablets (60 mg total) by mouth daily.   guaiFENesin 600 MG 12 hr tablet Commonly known as:  MUCINEX Take 600 mg by mouth at bedtime as needed for to loosen phlegm.   HYDROcodone-acetaminophen 10-325 MG tablet Commonly known as:  NORCO Take 1 tablet by mouth every 6 (six) hours as needed (for pain).   metoprolol tartrate 25 MG tablet Commonly known as:  LOPRESSOR Take 25 mg by mouth 2 (two) times daily.   nitroGLYCERIN 0.2 mg/hr patch Commonly known as:  NITRODUR - Dosed in mg/24 hr Apply one (1) patch to the left foot every other day.   pantoprazole 40 MG tablet Commonly known as:  PROTONIX Take 1 tablet (40 mg total) by mouth daily.   polyethylene glycol packet Commonly known as:  MIRALAX / GLYCOLAX Take 17 g by mouth daily as needed for moderate constipation. For constipation What changed:  reasons to take this  additional instructions   potassium chloride SA 20 MEQ tablet Commonly known as:  K-DUR,KLOR-CON Take 1 tablet (20 mEq total) by mouth daily. What changed:  when to take this   predniSONE 5 MG tablet Commonly known as:  DELTASONE Take 1.5 tablets (7.5 mg total) by mouth daily with breakfast. What changed:  Another medication with the same name was changed. Make sure you understand how and  when to take each.   predniSONE 10 MG tablet Commonly known as:  DELTASONE Take 1 tablet (10 mg total) by mouth daily with breakfast. What changed:  medication strength  how much to take   saccharomyces boulardii 250 MG capsule Commonly known as:  FLORASTOR Take 1 capsule (  250 mg total) by mouth 2 (two) times daily.   vitamin B-12 1000 MCG tablet Commonly known as:  CYANOCOBALAMIN Take 1,000 mcg by mouth daily with lunch.   Vitamin D 2000 units Caps Take 2,000 Units by mouth daily.   warfarin 5 MG tablet Commonly known as:  COUMADIN Take as directed by coumadin clinic What changed:  how much to take  how to take this  when to take this  additional instructions      Allergies  Allergen Reactions  . Tape Other (See Comments)    PATIENT'S SKIN IS VERY THIN AND WILL TEAR AND BRUISE VERY EASILY!! Paper take is ok   Follow-up Information    Josetta Huddle, MD Follow up in 1 week(s).   Specialty:  Internal Medicine Why:  hospital discharge follow up Contact information: [address] [place] [address] [phone]        Newt Minion, MD Follow up.   Specialty:  Orthopedic Surgery Why:  recurrent right leg cellulitis/right toe nail Contact information: [place] [address] [phone]        please have your coumadin level checked every three days start from monday Follow up.            The results of significant diagnostics from this hospitalization (including imaging, microbiology, ancillary and laboratory) are listed below for reference.    Significant Diagnostic Studies: Dg Tibia/fibula Right  Result Date: 03/09/2017 CLINICAL DATA:  Cellulitis EXAM: RIGHT TIBIA AND FIBULA - 2 VIEW COMPARISON:  None. FINDINGS: Right knee replacement is identified. No acute fracture or dislocation is seen. Diffuse vascular calcifications are noted. IMPRESSION: No acute abnormality noted. Electronically Signed   By:  Inez Catalina M.D.   On: 03/09/2017 09:14   Dg Chest Port 1 View  Result Date: 03/07/2017 CLINICAL DATA:  Hypoxia. EXAM: PORTABLE CHEST 1 VIEW COMPARISON:  Radiographs of Dec 12, 2016. FINDINGS: Stable cardiomegaly. Stable biapical calcifications. No pneumothorax or pleural effusion is noted. No acute pulmonary disease is noted. Bony thorax is unremarkable. IMPRESSION: No acute cardiopulmonary abnormality seen. Electronically Signed   By: Marijo Conception, M.D.   On: 03/07/2017 15:51    Microbiology: Recent Results (from the past 240 hour(s))  Culture, blood (Routine x 2)     Status: None (Preliminary result)   Collection Time: 03/07/17  3:20 PM  Result Value Ref Range Status   Specimen Description BLOOD RIGHT FOREARM  Final   Special Requests   Final    BOTTLES DRAWN AEROBIC AND ANAEROBIC Blood Culture adequate volume   Culture NO GROWTH < 24 HOURS  Final   Report Status PENDING  Incomplete  Culture, blood (Routine x 2)     Status: None (Preliminary result)   Collection Time: 03/07/17  3:34 PM  Result Value Ref Range Status   Specimen Description BLOOD RIGHT HAND  Final   Special Requests   Final    BOTTLES DRAWN AEROBIC AND ANAEROBIC Blood Culture adequate volume   Culture NO GROWTH < 24 HOURS  Final   Report Status PENDING  Incomplete  Urine culture     Status: Abnormal   Collection Time: 03/07/17  4:33 PM  Result Value Ref Range Status   Specimen Description URINE, CLEAN CATCH  Final   Special Requests NONE  Final   Culture <10,000 COLONIES/mL INSIGNIFICANT GROWTH (A)  Final   Report Status 03/08/2017 FINAL  Final  MRSA PCR Screening     Status:  None   Collection Time: 03/08/17  5:24 AM  Result Value Ref Range Status   MRSA by PCR NEGATIVE NEGATIVE Final    Comment:        The GeneXpert MRSA Assay (FDA approved for NASAL specimens only), is one component of a comprehensive MRSA colonization surveillance program. It is not intended to diagnose MRSA infection nor to guide  or monitor treatment for MRSA infections.      Labs: Basic Metabolic Panel:  Recent Labs Lab 03/07/17 1520 03/08/17 0546 03/09/17 0152  NA 136 139 138  K 4.4 3.4* 3.7  CL 101 105 106  CO2 26 27 23   GLUCOSE 151* 99 138*  BUN 23* 15 16  CREATININE 1.18 0.98 0.91  CALCIUM 8.4* 8.3* 8.2*  MG  --   --  1.9   Liver Function Tests:  Recent Labs Lab 03/07/17 1520 03/08/17 0546  AST 32 19  ALT 19 14*  ALKPHOS 52 45  BILITOT 1.0 1.9*  PROT 5.5* 5.5*  ALBUMIN 3.3* 3.0*   No results for input(s): LIPASE, AMYLASE in the last 168 hours. No results for input(s): AMMONIA in the last 168 hours. CBC:  Recent Labs Lab 03/07/17 1520 03/08/17 0546 03/09/17 0152  WBC 11.8* 9.0 8.8  NEUTROABS 10.7*  --  8.0*  HGB 11.2* 10.7* 10.5*  HCT 35.5* 34.6* 33.2*  MCV 93.2 92.0 93.0  PLT 157 148* 159   Cardiac Enzymes: No results for input(s): CKTOTAL, CKMB, CKMBINDEX, TROPONINI in the last 168 hours. BNP: BNP (last 3 results)  Recent Labs  09/26/16 1105  BNP 624.0*    ProBNP (last 3 results) No results for input(s): PROBNP in the last 8760 hours.  CBG:  Recent Labs Lab 03/08/17 0716 03/09/17 0718  GLUCAP 88 159*       Signed:  An Lannan MD, PhD  [facility] 03/09/2017, 10:02 AM

## 2017-03-10 ENCOUNTER — Telehealth: Payer: Self-pay | Admitting: Interventional Cardiology

## 2017-03-10 MED ORDER — FUROSEMIDE 40 MG PO TABS: 40.0000 mg | ORAL_TABLET | Freq: Every day | ORAL | 3 refills | Status: DC

## 2017-03-10 NOTE — Telephone Encounter (Signed)
Spoke with daughter, [name], [place] on file.  Advised her of recommendations per Dr. Tamala Julian.  Advised when appropriate to contact our office.  Daughter verbalized understanding and was in agreement with this plan.

## 2017-03-10 NOTE — Telephone Encounter (Signed)
Will route to Dr. Smith for review and advisement.  

## 2017-03-10 NOTE — Telephone Encounter (Signed)
Pt admitted to hospital on 8/10.

## 2017-03-10 NOTE — Telephone Encounter (Signed)
Spoke with Dr. Tamala Julian and he reviewed hospital labs.  Dr. Tamala Julian said ok to decrease Furosemide to 40mg  QD.  Pt needs to weigh daily and contact us if increased SOB, swelling or wt gain with lower dose.    Left message to call back

## 2017-03-10 NOTE — Telephone Encounter (Signed)
New message    Please review hospital notes , patient is admitted to hospital for dehydration , and infection.  The hospitalist thinks that he is taking to much Lasix, please advise

## 2017-03-12 LAB — CULTURE, BLOOD (ROUTINE X 2)
CULTURE: NO GROWTH
Culture: NO GROWTH
Special Requests: ADEQUATE
Special Requests: ADEQUATE

## 2017-03-13 ENCOUNTER — Ambulatory Visit (INDEPENDENT_AMBULATORY_CARE_PROVIDER_SITE_OTHER): Payer: Medicare Other | Admitting: Pharmacist

## 2017-03-13 DIAGNOSIS — Z7901 Long term (current) use of anticoagulants: Secondary | ICD-10-CM | POA: Diagnosis not present

## 2017-03-13 DIAGNOSIS — I482 Chronic atrial fibrillation, unspecified: Secondary | ICD-10-CM

## 2017-03-13 DIAGNOSIS — Z5181 Encounter for therapeutic drug level monitoring: Secondary | ICD-10-CM | POA: Diagnosis not present

## 2017-03-13 LAB — POCT INR: INR: 2.4

## 2017-03-25 ENCOUNTER — Other Ambulatory Visit: Payer: Self-pay | Admitting: Interventional Cardiology

## 2017-03-28 ENCOUNTER — Telehealth: Payer: Self-pay | Admitting: Neurology

## 2017-03-28 NOTE — Telephone Encounter (Signed)
Penicillins are usually considered safe but she can call back the doctor who prescribed the medication and see if has any other suggestions if she is uncomfortable. Her dentist is aware of patient's MG. thanks

## 2017-03-28 NOTE — Telephone Encounter (Signed)
Pt daughter (on [place] ) calling re: a dental procedure pt had this week and was given Amoxicillian (which according to pt's reading is something pt should not take based on his condition/diagnosis) pt daughter asking for a call back with what medication it would be okay for her father to take and she will contact the dentist office and ask for a prescription for whatever may be suggested.  Pt daughter also wants it noted that pt's prednisone has recently been tapered, please call

## 2017-04-01 NOTE — Telephone Encounter (Signed)
Called, LVM for daughter [name]) returning her call. Gave [facility] phone number

## 2017-04-01 NOTE — Telephone Encounter (Signed)
Called daughter back. She stated she found amoxicillin in one medication patient with MG should not take d/t risk of exacerbation. She already spoke with dental office and they switched medication. Nothing further needed at this time.

## 2017-04-03 ENCOUNTER — Encounter (INDEPENDENT_AMBULATORY_CARE_PROVIDER_SITE_OTHER): Payer: Self-pay | Admitting: Orthopedic Surgery

## 2017-04-03 ENCOUNTER — Ambulatory Visit (INDEPENDENT_AMBULATORY_CARE_PROVIDER_SITE_OTHER): Payer: Medicare Other | Admitting: Orthopedic Surgery

## 2017-04-03 DIAGNOSIS — T8131XD Disruption of external operation (surgical) wound, not elsewhere classified, subsequent encounter: Secondary | ICD-10-CM

## 2017-04-03 DIAGNOSIS — B351 Tinea unguium: Secondary | ICD-10-CM

## 2017-04-03 NOTE — Progress Notes (Signed)
Office Visit Note   Patient: Richard Bradley           Date of Birth: September 02, 1925           MRN: [id number] Visit Date: 04/03/2017              Requested by: Josetta Huddle, MD [address] [place], [place] [address] PCP: Josetta Huddle, MD  Chief Complaint  Patient presents with  . Left Foot - Pain      HPI: Patient returns in follow-up for 2 separate issues #1 wound dehiscence left transmetatarsal amputation #2 Monocryl mycotic nails 3 right foot.  Assessment & Plan: Visit Diagnoses:  1. Postoperative wound dehiscence, subsequent encounter   2. Onychomycosis     Plan: Nails trimmed 3 in 20 foot continue with nitroglycerin patches for the left foot and continue with Silvadene dressing changes.  Follow-Up Instructions: Return in about 4 weeks (around 05/01/2017).   Ortho Exam  Patient is alert, oriented, no adenopathy, well-dressed, normal affect, normal respiratory effort. Examination patient endplates in a wheelchair. There is no redness no cellulitis on either lower extremity left foot wound dehiscence is healing slowly externally measures 7 x 15 mm and 5 mm deep is good granulation tissue at the base no exposed bone or tendon. Right foot has onychomycotic nails 3 he is unable to trim the nails on his own and nails are trimmed 3 without complications.  Imaging: No results found. No images are attached to the encounter.  Labs: Lab Results  Component Value Date   HGBA1C 5.6 09/26/2011   ESRSEDRATE 7 05/24/2016   ESRSEDRATE 29 (H) 10/12/2014   ESRSEDRATE 8 09/26/2011   CRP 6.4 (H) 05/24/2016   CRP 14.0 (H) 10/12/2014   CRP 0.02 09/26/2011   REPTSTATUS 03/08/2017 FINAL 03/07/2017   GRAMSTAIN  10/12/2014    RARE WBC PRESENT,BOTH PMN AND MONONUCLEAR NO ORGANISMS SEEN Performed at [facility]  10/12/2014    RARE WBC PRESENT,BOTH PMN AND MONONUCLEAR NO ORGANISMS SEEN Performed at [facility] <10,000  COLONIES/mL INSIGNIFICANT GROWTH (A) 03/07/2017   LABORGA PSEUDOMONAS AERUGINOSA (A) 12/12/2016    Orders:  No orders of the defined types were placed in this encounter.  No orders of the defined types were placed in this encounter.    Procedures: No procedures performed  Clinical Data: No additional findings.  ROS:  All other systems negative, except as noted in the HPI. Review of Systems  Objective: Vital Signs: There were no vitals taken for this visit.  Specialty Comments:  No specialty comments available.  PMFS History: Patient Active Problem List   Diagnosis Date Noted  . Sepsis affecting skin ([place]) 03/07/2017  . Cellulitis of right lower extremity   . Hypotension   . S/P transmetatarsal amputation of foot, left ([place]) 02/06/2017  . Pseudomonas urinary tract infection 12/16/2016  . Altered mental status   . Sepsis ([place]) 12/12/2016  . Chronic systolic CHF (congestive heart failure) ([place]) 12/12/2016  . Wound dehiscence, surgical 10/03/2016  . Acute respiratory distress   . Goals of care, counseling/discussion   . Palliative care by specialist   . Hypokalemia 09/21/2016  . Benign essential HTN 09/21/2016  . Anticoagulated on Coumadin 12/20/2013  . Myasthenia gravis ([place]) 06/27/2011  . Chronic atrial fibrillation [facility])    Past Medical History:  Diagnosis Date  . Atrial fibrillation ([place])   . Cellulitis    left arm  . CHF (  congestive heart failure) ([place])   . Dyspnea    oxygen prn  . Dysrhythmia    afib  . GERD (gastroesophageal reflux disease)    ocassional  . History of kidney stones    passed  . Myasthenia gravis   . Osteoarthritis   . Pneumonia    Hstory of  . 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 Prostate cancer ([place]) dx'd 1982   treated with surgery and radiation.  Basal cell cancer- many  . Sepsis ([place])   . Urine incontinence   . Varicose veins of left lower extremity     Family History  Problem Relation Age of Onset  .  Heart disease Father   . Heart attack Father   . Hypertension Father   . Heart disease Brother   . Heart disease Paternal Grandfather   . Stroke Mother   . Parkinsonism Sister   . Heart disease Sister   . Dementia Sister     Past Surgical History:  Procedure Laterality Date  . AMPUTATION Left 03/20/2016   Procedure: Left Great Toe Amputation at Metatarsophalangeal Joint;  Surgeon: Newt Minion, MD;  Location: [place];  Service: Orthopedics;  Laterality: Left;  . AMPUTATION Left 06/11/2016   Procedure: Left 2nd Toe Amputation;  Surgeon: Newt Minion, MD;  Location: [place];  Service: Orthopedics;  Laterality: Left;  . AMPUTATION Left 07/12/2016   Procedure: LEFT 3rd TOE AMPUTATION;  Surgeon: Newt Minion, MD;  Location: [facility];  Service: Orthopedics;  Laterality: Left;  . AMPUTATION Left 09/11/2016   Procedure: Left Transmetatarsal Amputation;  Surgeon: Newt Minion, MD;  Location: [facility];  Service: Orthopedics;  Laterality: Left;  . appendectomy    . APPENDECTOMY    . COLONOSCOPY    . HERNIA REPAIR Bilateral    Inguinal  . I&D EXTREMITY Left 10/12/2014   Procedure: IRRIGATION AND DEBRIDEMENT INDEX FINGER;  Surgeon: Iran Planas, MD;  Location: [facility];  Service: Orthopedics;  Laterality: Left;  . KYPHOPLASTY  2012 ?   T 12- L1  . LAMINECTOMY  2005 ?   lumbar- 4-5  . PROSTATE SURGERY    . ROTATOR CUFF REPAIR Right   . STUMP REVISION Left 12/11/2016   Procedure: Revision Left Transmetatarsal Amputation, Apply Wound VAC;  Surgeon: Newt Minion, MD;  Location: [facility] [facility];  Service: Orthopedics;  Laterality: Left;  . TOTAL KNEE ARTHROPLASTY Right    Social History   Occupational History  . Retired [employer]   Social History Main Topics  . Smoking status: Never Smoker  . Smokeless tobacco: Never Used  . Alcohol use 1.2 oz/week    2 Cans of beer per week     Comment: occasional beer  . Drug use: No  . Sexual activity: Not on file     Comment: Widowed

## 2017-04-09 ENCOUNTER — Ambulatory Visit (INDEPENDENT_AMBULATORY_CARE_PROVIDER_SITE_OTHER): Payer: Medicare Other | Admitting: *Deleted

## 2017-04-09 DIAGNOSIS — Z7901 Long term (current) use of anticoagulants: Secondary | ICD-10-CM | POA: Diagnosis not present

## 2017-04-09 DIAGNOSIS — Z5181 Encounter for therapeutic drug level monitoring: Secondary | ICD-10-CM | POA: Diagnosis not present

## 2017-04-09 DIAGNOSIS — I482 Chronic atrial fibrillation, unspecified: Secondary | ICD-10-CM

## 2017-04-09 LAB — POCT INR: INR: 2.6

## 2017-04-12 ENCOUNTER — Other Ambulatory Visit: Payer: Self-pay | Admitting: Neurology

## 2017-04-17 ENCOUNTER — Observation Stay (HOSPITAL_COMMUNITY): Payer: Medicare Other

## 2017-04-17 ENCOUNTER — Emergency Department (HOSPITAL_COMMUNITY): Payer: Medicare Other

## 2017-04-17 ENCOUNTER — Observation Stay (HOSPITAL_COMMUNITY)
Admission: EM | Admit: 2017-04-17 | Discharge: 2017-04-18 | Disposition: A | Discharge status: Home / Self Care | Payer: Medicare Other | Attending: Internal Medicine | Admitting: Internal Medicine

## 2017-04-17 ENCOUNTER — Encounter (HOSPITAL_COMMUNITY): Payer: Self-pay

## 2017-04-17 DIAGNOSIS — Z79899 Other long term (current) drug therapy: Secondary | ICD-10-CM | POA: Diagnosis not present

## 2017-04-17 DIAGNOSIS — Z87442 Personal history of urinary calculi: Secondary | ICD-10-CM | POA: Diagnosis not present

## 2017-04-17 DIAGNOSIS — Z66 Do not resuscitate: Secondary | ICD-10-CM | POA: Insufficient documentation

## 2017-04-17 DIAGNOSIS — Z89412 Acquired absence of left great toe: Secondary | ICD-10-CM | POA: Insufficient documentation

## 2017-04-17 DIAGNOSIS — Z89422 Acquired absence of other left toe(s): Secondary | ICD-10-CM | POA: Insufficient documentation

## 2017-04-17 DIAGNOSIS — Z7901 Long term (current) use of anticoagulants: Secondary | ICD-10-CM | POA: Insufficient documentation

## 2017-04-17 DIAGNOSIS — I110 Hypertensive heart disease with heart failure: Secondary | ICD-10-CM | POA: Diagnosis not present

## 2017-04-17 DIAGNOSIS — J929 Pleural plaque without asbestos: Secondary | ICD-10-CM | POA: Insufficient documentation

## 2017-04-17 DIAGNOSIS — E872 Acidosis, unspecified: Secondary | ICD-10-CM

## 2017-04-17 DIAGNOSIS — Z85828 Personal history of other malignant neoplasm of skin: Secondary | ICD-10-CM | POA: Diagnosis not present

## 2017-04-17 DIAGNOSIS — R4182 Altered mental status, unspecified: Secondary | ICD-10-CM | POA: Diagnosis present

## 2017-04-17 DIAGNOSIS — G3189 Other specified degenerative diseases of nervous system: Secondary | ICD-10-CM | POA: Insufficient documentation

## 2017-04-17 DIAGNOSIS — K219 Gastro-esophageal reflux disease without esophagitis: Secondary | ICD-10-CM | POA: Insufficient documentation

## 2017-04-17 DIAGNOSIS — Z8249 Family history of ischemic heart disease and other diseases of the circulatory system: Secondary | ICD-10-CM | POA: Diagnosis not present

## 2017-04-17 DIAGNOSIS — R410 Disorientation, unspecified: Secondary | ICD-10-CM | POA: Diagnosis not present

## 2017-04-17 DIAGNOSIS — Z8546 Personal history of malignant neoplasm of prostate: Secondary | ICD-10-CM | POA: Insufficient documentation

## 2017-04-17 DIAGNOSIS — I482 Chronic atrial fibrillation, unspecified: Secondary | ICD-10-CM | POA: Diagnosis present

## 2017-04-17 DIAGNOSIS — G9340 Encephalopathy, unspecified: Secondary | ICD-10-CM

## 2017-04-17 DIAGNOSIS — I7389 Other specified peripheral vascular diseases: Secondary | ICD-10-CM | POA: Insufficient documentation

## 2017-04-17 DIAGNOSIS — Z96651 Presence of right artificial knee joint: Secondary | ICD-10-CM | POA: Insufficient documentation

## 2017-04-17 DIAGNOSIS — I5022 Chronic systolic (congestive) heart failure: Secondary | ICD-10-CM | POA: Diagnosis not present

## 2017-04-17 DIAGNOSIS — R9082 White matter disease, unspecified: Secondary | ICD-10-CM | POA: Insufficient documentation

## 2017-04-17 DIAGNOSIS — Z923 Personal history of irradiation: Secondary | ICD-10-CM | POA: Insufficient documentation

## 2017-04-17 DIAGNOSIS — G7000 Myasthenia gravis without (acute) exacerbation: Secondary | ICD-10-CM | POA: Diagnosis not present

## 2017-04-17 DIAGNOSIS — M1990 Unspecified osteoarthritis, unspecified site: Secondary | ICD-10-CM | POA: Insufficient documentation

## 2017-04-17 DIAGNOSIS — Z888 Allergy status to other drugs, medicaments and biological substances status: Secondary | ICD-10-CM | POA: Insufficient documentation

## 2017-04-17 LAB — TSH: TSH: 0.981 u[IU]/mL (ref 0.350–4.500)

## 2017-04-17 LAB — COMPREHENSIVE METABOLIC PANEL
ALK PHOS: 44 U/L (ref 38–126)
ALT: 13 U/L — ABNORMAL LOW (ref 17–63)
ANION GAP: 6 (ref 5–15)
AST: 22 U/L (ref 15–41)
Albumin: 3.4 g/dL — ABNORMAL LOW (ref 3.5–5.0)
BILIRUBIN TOTAL: 0.9 mg/dL (ref 0.3–1.2)
BUN: 19 mg/dL (ref 6–20)
CALCIUM: 8.6 mg/dL — AB (ref 8.9–10.3)
CO2: 27 mmol/L (ref 22–32)
Chloride: 106 mmol/L (ref 101–111)
Creatinine, Ser: 1.13 mg/dL (ref 0.61–1.24)
GFR calc Af Amer: >60 mL/min (ref >60)
GFR, EST NON AFRICAN AMERICAN: 56 mL/min — AB (ref >60)
Glucose, Bld: 157 mg/dL — ABNORMAL HIGH (ref 65–99)
POTASSIUM: 3.5 mmol/L (ref 3.5–5.1)
Sodium: 139 mmol/L (ref 135–145)
Total Protein: 5.6 g/dL — ABNORMAL LOW (ref 6.5–8.1)

## 2017-04-17 LAB — I-STAT TROPONIN, ED
TROPONIN I, POC: 0.02 ng/mL (ref 0.00–0.08)
Troponin i, poc: 0.02 ng/mL (ref 0.00–0.08)

## 2017-04-17 LAB — CBC WITH DIFFERENTIAL/PLATELET
Basophils Absolute: 0 10*3/uL (ref 0.0–0.1)
Basophils Relative: 0 %
EOS ABS: 0 10*3/uL (ref 0.0–0.7)
Eosinophils Relative: 0 %
HEMATOCRIT: 35.3 % — AB (ref 39.0–52.0)
HEMOGLOBIN: 11.1 g/dL — AB (ref 13.0–17.0)
LYMPHS ABS: 0.8 10*3/uL (ref 0.7–4.0)
LYMPHS PCT: 10 %
MCH: 29 pg (ref 26.0–34.0)
MCHC: 31.4 g/dL (ref 30.0–36.0)
MCV: 92.2 fL (ref 78.0–100.0)
MONOS PCT: 9 %
Monocytes Absolute: 0.7 10*3/uL (ref 0.1–1.0)
NEUTROS PCT: 81 %
Neutro Abs: 6.2 10*3/uL (ref 1.7–7.7)
Platelets: 199 10*3/uL (ref 150–400)
RBC: 3.83 MIL/uL — AB (ref 4.22–5.81)
RDW: 16.7 % — AB (ref 11.5–15.5)
WBC: 7.7 10*3/uL (ref 4.0–10.5)

## 2017-04-17 LAB — CORTISOL: Cortisol, Plasma: 1.2 ug/dL

## 2017-04-17 LAB — URINALYSIS, ROUTINE W REFLEX MICROSCOPIC
BILIRUBIN URINE: NEGATIVE
GLUCOSE, UA: NEGATIVE mg/dL
HGB URINE DIPSTICK: NEGATIVE
Ketones, ur: NEGATIVE mg/dL
Leukocytes, UA: NEGATIVE
Nitrite: NEGATIVE
Protein, ur: NEGATIVE mg/dL
Specific Gravity, Urine: 1.019 (ref 1.005–1.030)
pH: 5 (ref 5.0–8.0)

## 2017-04-17 LAB — BRAIN NATRIURETIC PEPTIDE: B NATRIURETIC PEPTIDE 5: 265.5 pg/mL — AB (ref 0.0–100.0)

## 2017-04-17 LAB — I-STAT CG4 LACTIC ACID, ED
LACTIC ACID, VENOUS: 2.07 mmol/L — AB (ref 0.5–1.9)
Lactic Acid, Venous: 2.95 mmol/L (ref 0.5–1.9)

## 2017-04-17 LAB — I-STAT VENOUS BLOOD GAS, ED
ACID-BASE EXCESS: 5 mmol/L — AB (ref 0.0–2.0)
Bicarbonate: 29.9 mmol/L — ABNORMAL HIGH (ref 20.0–28.0)
O2 Saturation: 99 %
PH VEN: 7.427 (ref 7.250–7.430)
TCO2: 31 mmol/L (ref 22–32)
pCO2, Ven: 45.3 mmHg (ref 44.0–60.0)
pO2, Ven: 134 mmHg — ABNORMAL HIGH (ref 32.0–45.0)

## 2017-04-17 LAB — MAGNESIUM: MAGNESIUM: 2.1 mg/dL (ref 1.7–2.4)

## 2017-04-17 LAB — PROTIME-INR
INR: 2.14
PROTHROMBIN TIME: 23.7 s — AB (ref 11.4–15.2)

## 2017-04-17 LAB — T4, FREE: FREE T4: 1.14 ng/dL — AB (ref 0.61–1.12)

## 2017-04-17 MED ORDER — POTASSIUM CHLORIDE CRYS ER 20 MEQ PO TBCR: 20.0000 meq | EXTENDED_RELEASE_TABLET | Freq: Every day | ORAL | Status: DC

## 2017-04-17 MED ORDER — ACETAMINOPHEN 325 MG PO TABS: 650.0000 mg | ORAL_TABLET | Freq: Four times a day (QID) | ORAL | Status: DC | PRN

## 2017-04-17 MED ORDER — ONDANSETRON HCL 4 MG/2ML IJ SOLN: 4.0000 mg | Freq: Four times a day (QID) | INTRAMUSCULAR | Status: DC | PRN

## 2017-04-17 MED ORDER — PREDNISONE 20 MG PO TABS
20.0000 mg | ORAL_TABLET | Freq: Every day | ORAL | Status: DC
  Administered 2017-04-18: 20 mg via ORAL
  Filled 2017-04-17: qty 1

## 2017-04-17 MED ORDER — WARFARIN - PHARMACIST DOSING INPATIENT: Freq: Every day | Status: DC

## 2017-04-17 MED ORDER — FUROSEMIDE 20 MG PO TABS: 20.0000 mg | ORAL_TABLET | ORAL | Status: DC

## 2017-04-17 MED ORDER — METOPROLOL TARTRATE 25 MG PO TABS
25.0000 mg | ORAL_TABLET | Freq: Two times a day (BID) | ORAL | Status: DC
  Administered 2017-04-18: 25 mg via ORAL
  Filled 2017-04-17: qty 1

## 2017-04-17 MED ORDER — VITAMIN D 1000 UNITS PO TABS
2000.0000 [IU] | ORAL_TABLET | Freq: Every day | ORAL | Status: DC
  Administered 2017-04-18: 2000 [IU] via ORAL
  Filled 2017-04-17: qty 2

## 2017-04-17 MED ORDER — HYDROCORTISONE NA SUCCINATE PF 100 MG IJ SOLR
100.0000 mg | Freq: Once | INTRAMUSCULAR | Status: DC
  Filled 2017-04-17: qty 2

## 2017-04-17 MED ORDER — ACETAMINOPHEN 650 MG RE SUPP: 650.0000 mg | Freq: Four times a day (QID) | RECTAL | Status: DC | PRN

## 2017-04-17 MED ORDER — VITAMIN B-12 1000 MCG PO TABS
1000.0000 ug | ORAL_TABLET | Freq: Every day | ORAL | Status: DC
  Administered 2017-04-18: 1000 ug via ORAL
  Filled 2017-04-17: qty 1

## 2017-04-17 MED ORDER — PREDNISONE 5 MG PO TABS: 17.5000 mg | ORAL_TABLET | ORAL | Status: DC

## 2017-04-17 MED ORDER — PANTOPRAZOLE SODIUM 40 MG PO TBEC
40.0000 mg | DELAYED_RELEASE_TABLET | Freq: Every day | ORAL | Status: DC
  Administered 2017-04-18: 40 mg via ORAL
  Filled 2017-04-17: qty 1

## 2017-04-17 MED ORDER — ESCITALOPRAM OXALATE 10 MG PO TABS
5.0000 mg | ORAL_TABLET | Freq: Every day | ORAL | Status: DC
  Administered 2017-04-18: 5 mg via ORAL
  Filled 2017-04-17: qty 1

## 2017-04-17 MED ORDER — GUAIFENESIN ER 600 MG PO TB12: 600.0000 mg | ORAL_TABLET | Freq: Every evening | ORAL | Status: DC | PRN

## 2017-04-17 MED ORDER — POLYETHYLENE GLYCOL 3350 17 G PO PACK: 17.0000 g | PACK | Freq: Every day | ORAL | Status: DC | PRN

## 2017-04-17 MED ORDER — SODIUM CHLORIDE 0.9 % IV BOLUS (SEPSIS)
750.0000 mL | Freq: Once | INTRAVENOUS | Status: AC
  Administered 2017-04-17: 750 mL via INTRAVENOUS

## 2017-04-17 MED ORDER — ONDANSETRON HCL 4 MG PO TABS: 4.0000 mg | ORAL_TABLET | Freq: Four times a day (QID) | ORAL | Status: DC | PRN

## 2017-04-17 MED ORDER — FLUTICASONE PROPIONATE 50 MCG/ACT NA SUSP
2.0000 | Freq: Two times a day (BID) | NASAL | Status: DC
  Administered 2017-04-18: 2 via NASAL
  Filled 2017-04-17: qty 16

## 2017-04-17 MED ORDER — HYDROCODONE-ACETAMINOPHEN 10-325 MG PO TABS: 1.0000 | ORAL_TABLET | Freq: Four times a day (QID) | ORAL | Status: DC | PRN

## 2017-04-17 NOTE — ED Notes (Signed)
Admitting at bedside 

## 2017-04-17 NOTE — ED Notes (Signed)
Pt attempted to have bowel movement without success

## 2017-04-17 NOTE — ED Provider Notes (Signed)
[facility] DEPT Provider Note   CSN: [id number] Arrival date & time: 04/17/17  1643     History   Chief Complaint No chief complaint on file.   HPI DENNIE MOLTZ is a 83 y.o. male.  HPI   Monday he seemed more fatigued, confused, flat affect.  Would ask "who were all those people in the house" and there was noone. The other morning he was yelling, turned all the lights on, yelling "hey", was 5AM, thought it was evening and was confused. Sometimes he will have dreams and he will think they are real but will return to normal.  No fever.  Typically low blood pressure and high heart rate. 110 is normal. Has been in 90s for top number before but not when he is in a good spot. Takes mucinex every night, thought maybe too much magnesium. Took him to the doctor, they did EKG and sent him here.  This is how he acts when he has an infection.   Feels like he is in a fog, says "i am losing my mind" but he doesn't know why. Feels like his medicine is missed up. Went down to 40 lasix but 1 mo ago went back to 60. Also decreased prednisone-had been on 20mg  daily and decreased down to 10mg . No other new medication changes.  Past Medical History:  Diagnosis Date  . Atrial fibrillation ([place])   . Cellulitis    left arm  . CHF (congestive heart failure) ([place])   . Dyspnea    oxygen prn  . Dysrhythmia    afib  . GERD (gastroesophageal reflux disease)    ocassional  . History of kidney stones    passed  . Myasthenia gravis   . Osteoarthritis   . Pneumonia    Hstory of  . 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 Prostate cancer ([place]) dx'd 1982   treated with surgery and radiation.  Basal cell cancer- many  . Sepsis ([place])   . Urine incontinence   . Varicose veins of left lower extremity     Patient Active Problem List   Diagnosis Date Noted  . Sepsis affecting skin ([place]) 03/07/2017  . Cellulitis of right lower extremity   . Hypotension   . S/P transmetatarsal amputation  of foot, left ([place]) 02/06/2017  . Pseudomonas urinary tract infection 12/16/2016  . Altered mental status   . Sepsis ([place]) 12/12/2016  . Chronic systolic CHF (congestive heart failure) ([place]) 12/12/2016  . Wound dehiscence, surgical 10/03/2016  . Acute respiratory distress   . Goals of care, counseling/discussion   . Palliative care by specialist   . Hypokalemia 09/21/2016  . Benign essential HTN 09/21/2016  . Anticoagulated on Coumadin 12/20/2013  . Myasthenia gravis ([place]) 06/27/2011  . Chronic atrial fibrillation [facility])     Past Surgical History:  Procedure Laterality Date  . AMPUTATION Left 03/20/2016   Procedure: Left Great Toe Amputation at Metatarsophalangeal Joint;  Surgeon: Newt Minion, MD;  Location: [facility];  Service: Orthopedics;  Laterality: Left;  . AMPUTATION Left 06/11/2016   Procedure: Left 2nd Toe Amputation;  Surgeon: Newt Minion, MD;  Location: [facility];  Service: Orthopedics;  Laterality: Left;  . AMPUTATION Left 07/12/2016   Procedure: LEFT 3rd TOE AMPUTATION;  Surgeon: Newt Minion, MD;  Location: [facility];  Service: Orthopedics;  Laterality: Left;  . AMPUTATION Left 09/11/2016   Procedure: Left Transmetatarsal Amputation;  Surgeon: Newt Minion, MD;  Location: [facility];  Service: Orthopedics;  Laterality:  Left;  . appendectomy    . APPENDECTOMY    . COLONOSCOPY    . HERNIA REPAIR Bilateral    Inguinal  . I&D EXTREMITY Left 10/12/2014   Procedure: IRRIGATION AND DEBRIDEMENT INDEX FINGER;  Surgeon: Iran Planas, MD;  Location: [facility];  Service: Orthopedics;  Laterality: Left;  . KYPHOPLASTY  2012 ?   T 12- L1  . LAMINECTOMY  2005 ?   lumbar- 4-5  . PROSTATE SURGERY    . ROTATOR CUFF REPAIR Right   . STUMP REVISION Left 12/11/2016   Procedure: Revision Left Transmetatarsal Amputation, Apply Wound VAC;  Surgeon: Newt Minion, MD;  Location: [facility];  Service: Orthopedics;  Laterality: Left;  . TOTAL KNEE ARTHROPLASTY Right        Home Medications    Prior to  Admission medications   Medication Sig Start Date End Date Taking? Authorizing Provider  Cholecalciferol (VITAMIN D) 2000 units CAPS Take 2,000 Units by mouth daily.   Yes [provider]  escitalopram (LEXAPRO) 5 MG tablet Take 5 mg by mouth daily.  09/15/16  Yes [provider]  FIBER PO Take 1 tablet by mouth 3 (three) times daily.    Yes [provider]  fluticasone (FLONASE) 50 MCG/ACT nasal spray Place 2 sprays into both nostrils 2 (two) times daily.    Yes [provider]  furosemide (LASIX) 40 MG tablet Take 1 tablet (40 mg total) by mouth daily. Patient taking differently: Take 20-40 mg by mouth See admin instructions. Takes 40 mg in morning at 20 mg at lunch 03/10/17 06/08/17 Yes Belva Crome, MD  guaiFENesin (MUCINEX) 600 MG 12 hr tablet Take 600 mg by mouth at bedtime as needed for to loosen phlegm.    Yes [provider]  HYDROcodone-acetaminophen (NORCO) 10-325 MG tablet Take 1 tablet by mouth every 6 (six) hours as needed (for pain). 12/16/16  Yes Dhungel, Nishant, MD  metoprolol tartrate (LOPRESSOR) 25 MG tablet Take 25 mg by mouth 2 (two) times daily. 01/07/17  Yes [provider]  nitroGLYCERIN (NITRODUR - DOSED IN MG/24 HR) 0.2 mg/hr patch Apply one (1) patch to the left foot every other day. 01/22/17  Yes [provider]  pantoprazole (PROTONIX) 40 MG tablet Take 1 tablet (40 mg total) by mouth daily. 10/02/16  Yes Rai, Ripudeep K, MD  polyethylene glycol (MIRALAX / GLYCOLAX) packet Take 17 g by mouth daily as needed for moderate constipation. For constipation Patient taking differently: Take 17 g by mouth daily as needed for mild constipation or moderate constipation.  10/02/16  Yes Rai, Ripudeep K, MD  potassium chloride SA (K-DUR,KLOR-CON) 20 MEQ tablet Take 1 tablet (20 mEq total) by mouth daily. Patient taking differently: Take 20 mEq by mouth daily with lunch.  10/02/16  Yes Rai, Ripudeep K, MD  predniSONE (DELTASONE) 10  MG tablet Take 1 tablet (10 mg total) by mouth daily with breakfast. Patient taking differently: Take 17.5 mg by mouth every morning. Takes 17.5 in the morning 03/09/17  Yes Florencia Reasons, MD  vitamin B-12 (CYANOCOBALAMIN) 1000 MCG tablet Take 1,000 mcg by mouth daily with lunch.    Yes [provider]  warfarin (COUMADIN) 5 MG tablet TAKE AS DIRECTED BY COUMADIN CLINIC Patient taking differently: 5 mg on Monday and Friday//// 2.5 Tues. Wed. Thurs. Sat and sunday takes in evening 03/26/17  Yes Belva Crome, MD    Family History Family History  Problem Relation Age of Onset  . Heart disease  Father   . Heart attack Father   . Hypertension Father   . Heart disease Brother   . Heart disease Paternal Grandfather   . Stroke Mother   . Parkinsonism Sister   . Heart disease Sister   . Dementia Sister     Social History Social History  Substance Use Topics  . Smoking status: Never Smoker  . Smokeless tobacco: Never Used  . Alcohol use 1.2 oz/week    2 Cans of beer per week     Comment: occasional beer     Allergies   Tape   Review of Systems Review of Systems  Constitutional: Positive for fatigue. Negative for fever.  HENT: Negative for congestion and sore throat.   Eyes: Positive for visual disturbance (blurry this week).  Respiratory: Positive for shortness of breath (with exertion, normal for him). Negative for cough.   Cardiovascular: Negative for chest pain.  Gastrointestinal: Negative for abdominal pain, blood in stool, diarrhea, nausea and vomiting.  Genitourinary: Negative for difficulty urinating and dysuria.  Musculoskeletal: Negative for back pain and neck stiffness.  Skin: Negative for rash.  Neurological: Negative for syncope, facial asymmetry, weakness, numbness and headaches.  Psychiatric/Behavioral: Positive for confusion and hallucinations.     Physical Exam Updated Vital Signs BP 109/73   Pulse (!) 103   Temp 98.3 F (36.8 C) (Oral)   Resp (!) 25    SpO2 91%   Physical Exam  Constitutional: He appears well-developed and well-nourished. No distress.  HENT:  Head: Normocephalic and atraumatic.  Dry mucus membranes  Eyes: Conjunctivae and EOM are normal.  Neck: Normal range of motion.  Cardiovascular: Normal rate, regular rhythm, normal heart sounds and intact distal pulses.  Exam reveals no gallop and no friction rub.   No murmur heard. Pulmonary/Chest: Effort normal and breath sounds normal. No respiratory distress. He has no wheezes. He has no rales.  Abdominal: Soft. He exhibits no distension. There is no tenderness. There is no guarding.  Musculoskeletal: He exhibits no edema (mild edema LLE).  Left transmetatarsal amputation No surrounding erythema  Neurological: He is alert. He has normal strength. No cranial nerve deficit or sensory deficit. Coordination normal.  September, Thursday, not sure of year reports 1980 (normally UTD on year per family)  Skin: Skin is warm and dry. He is not diaphoretic.  Nursing note and vitals reviewed.    ED Treatments / Results  Labs (all labs ordered are listed, but only abnormal results are displayed) Labs Reviewed  CBC WITH DIFFERENTIAL/PLATELET - Abnormal; Notable for the following:       Result Value   RBC 3.83 (*)    Hemoglobin 11.1 (*)    HCT 35.3 (*)    RDW 16.7 (*)    All other components within normal limits  COMPREHENSIVE METABOLIC PANEL - Abnormal; Notable for the following:    Glucose, Bld 157 (*)    Calcium 8.6 (*)    Total Protein 5.6 (*)    Albumin 3.4 (*)    ALT 13 (*)    GFR calc non Af Amer 56 (*)    All other components within normal limits  PROTIME-INR - Abnormal; Notable for the following:    Prothrombin Time 23.7 (*)    All other components within normal limits  I-STAT CG4 LACTIC ACID, ED - Abnormal; Notable for the following:    Lactic Acid, Venous 2.95 (*)    All other components within normal limits  URINE CULTURE  MAGNESIUM  URINALYSIS, ROUTINE  W  REFLEX MICROSCOPIC  TSH  T4, FREE  CORTISOL  BRAIN NATRIURETIC PEPTIDE  I-STAT TROPONIN, ED  I-STAT CG4 LACTIC ACID, ED  I-STAT VENOUS BLOOD GAS, ED    EKG  EKG Interpretation None       Radiology Dg Chest 2 View  Result Date: 04/17/2017 CLINICAL DATA:  Altered mental status, shortness of breath for several days. History of CHF. EXAM: CHEST  2 VIEW COMPARISON:  Chest x-rays dated 03/07/2017 and 09/20/2016. FINDINGS: Cardiomegaly appears stable. Overall cardiomediastinal silhouette appears stable. Aortic atherosclerosis. Extensive bilateral calcified pleural plaques are stable. Lungs otherwise clear. No pleural effusion or pneumothorax seen. No acute or suspicious osseous finding. IMPRESSION: No active cardiopulmonary disease. No evidence of pneumonia or pulmonary edema. Stable cardiomegaly. Stable bilateral calcified pleural plaques. Electronically Signed   By: Franki Cabot M.D.   On: 04/17/2017 18:46   Ct Head Wo Contrast  Result Date: 04/17/2017 CLINICAL DATA:  Altered level of consciousness, confusion over the last 3 days EXAM: CT HEAD WITHOUT CONTRAST TECHNIQUE: Contiguous axial images were obtained from the base of the skull through the vertex without intravenous contrast. COMPARISON:  11/22/2011 FINDINGS: Brain: Extensive brain atrophy and chronic white matter microvascular ischemic changes throughout both cerebral hemispheres. No acute intracranial hemorrhage, mass lesion, midline shift, herniation, hydrocephalus or extra-axial fluid collection. No focal mass effect or edema. Cisterns are patent. Cerebellar atrophy as well. Vascular: Intracranial atherosclerosis noted.  No hyperdense vessel. Skull: Normal. Negative for fracture or focal lesion. Sinuses/Orbits: No acute orbital findings. Minor scattered sinus mucosal thickening. No sinus air-fluid levels or hemorrhage. Mastoids are clear. Other: None. IMPRESSION: Stable brain atrophy and chronic white matter microvascular changes. No  interval change or acute process by noncontrast CT. Electronically Signed   By: Jerilynn Mages.  Shick M.D.   On: 04/17/2017 19:39    Procedures Procedures (including critical care time)  Medications Ordered in ED Medications  polyethylene glycol (MIRALAX / GLYCOLAX) packet 17 g (not administered)  pantoprazole (PROTONIX) EC tablet 40 mg (not administered)  vitamin B-12 (CYANOCOBALAMIN) tablet 1,000 mcg (not administered)  potassium chloride SA (K-DUR,KLOR-CON) CR tablet 20 mEq (not administered)  escitalopram (LEXAPRO) tablet 5 mg (not administered)  Vitamin D CAPS 2,000 Units (not administered)  fluticasone (FLONASE) 50 MCG/ACT nasal spray 2 spray (not administered)  guaiFENesin (MUCINEX) 12 hr tablet 600 mg (not administered)  HYDROcodone-acetaminophen (NORCO) 10-325 MG per tablet 1 tablet (not administered)  predniSONE (DELTASONE) tablet 17.5 mg (not administered)  sodium chloride 0.9 % bolus 750 mL (0 mLs Intravenous Stopped 04/17/17 2134)     Initial Impression / Assessment and Plan / ED Course  I have reviewed the triage vital signs and the nursing notes.  Pertinent labs & imaging results that were available during my care of the patient were reviewed by me and considered in my medical decision making (see chart for details).     83 year old male with a history of myasthenia gravis, prostate cancer status post treatment, congestive heart failure with an ejection fraction of 35-40%, atrial fibrillation on Coumadin, peripheral vascular disease, transmetatarsal amputation of the left foot, presents with concern for fatigue, confusion at home.  No focal findings on exam.  No chest pain or dyspnea to suggest ACS, CHF or PE.  Chest XR shows no signs of pneumonia or pulmonary edema.  EKG is similar to prior, showing atrial fibrillation with multiple PVCs.  Urinalysis shows no signs of infection. He has no leukocytosis. Hemoglobin is at baseline. CT head shows no acute findings.  Patient has a nonfocal  neurologic exam, with the exception of slight speech slurring.  Daughter reports that his blood pressures are low at baseline. Blood pressures 32K-025K systolic in ED.  Lactic acid is elevated to 2.95.  Doubt sepsis given no source of infection, afebrile, no white count. Suspect this is secondary to dehydration. Given IV fluids.   On reevaluation, patient continues to appear confused, talkative, tangential, speech slurred. Noted to have blood pressures low 27C systolic, some readings in 80s of unclear accuracy. He appears to have some dyspnea with conversation.  Possible hydration causing symptoms, and now fluid overload.    Continue to have low suspicion for sepsis in absence of source, fever or leukocytosis.  Possible cardiogenic etiology of low perfusion causing lactic acid and altered mental status.  Doubt PE in setting of therapeutic coumadin, pt denying dyspnea or CP. Patient with lower dose of prednisone over last few weeks, consider stress dose steroids.   Do not feel his symptoms are consistent with myasthenic crisis, however given some dyspnea with conversation now will check NIF and VC.  He does not have signs of weak cough/report dyspnea/has no hypophonia.    Will order MRI given slight slurred speech, visual changes per pt.  Checking cortisol, TSH.   Will admit patient with concern for encephalopathy of unclear etiology, poor perfusion, r/o dehydration/cardiogenic/metabolic etiologies.       Final Clinical Impressions(s) / ED Diagnoses   Final diagnoses:  Encephalopathy  Lactic acidosis    New Prescriptions New Prescriptions   No medications on file     Gareth Morgan, MD 04/17/17 2145

## 2017-04-17 NOTE — H&P (Signed)
History and Physical    Richard Bradley:[id number] DOB: 1925-09-24 DOA: 04/17/2017  PCP: Josetta Huddle, MD  Patient coming from: Home  I have personally briefly reviewed patient's old medical records in [place]  Chief Complaint: AMS  HPI: Richard Bradley is a 83 y.o. male with medical history significant of myasthenia gravis, A.Fib on coumadin.  Recently admitted a month ago with cellulitis of leg.  Patient presents to the ED with c/o increased fatigue, confusion.  Onset Monday per daughter, persistent.  Per daughter this is typically how he acts when he gets infection.  Went down to lasix 40 x1 month ago, but back up to 60. Had been on prednisone 20mg  daily for a long time now decreased to 17.5 x1 month ago.   ED Course: CXR, CT head, CBC, BMP, UA are unremarkable.  Lactate 2.95 decreases to 2.0 after 750cc NS bolus.  Trop neg.   Review of Systems: As per HPI otherwise 10 point review of systems negative.   Past Medical History:  Diagnosis Date  . Atrial fibrillation ([place])   . Cellulitis    left arm  . CHF (congestive heart failure) ([place])   . Dyspnea    oxygen prn  . Dysrhythmia    afib  . GERD (gastroesophageal reflux disease)    ocassional  . History of kidney stones    passed  . Myasthenia gravis   . Osteoarthritis   . Pneumonia    Hstory of  . 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 Prostate cancer ([place]) dx'd 1982   treated with surgery and radiation.  Basal cell cancer- many  . Sepsis ([place])   . Urine incontinence   . Varicose veins of left lower extremity     Past Surgical History:  Procedure Laterality Date  . AMPUTATION Left 03/20/2016   Procedure: Left Great Toe Amputation at Metatarsophalangeal Joint;  Surgeon: Newt Minion, MD;  Location: [facility];  Service: Orthopedics;  Laterality: Left;  . AMPUTATION Left 06/11/2016   Procedure: Left 2nd Toe Amputation;  Surgeon: Newt Minion, MD;  Location: [facility];  Service: Orthopedics;   Laterality: Left;  . AMPUTATION Left 07/12/2016   Procedure: LEFT 3rd TOE AMPUTATION;  Surgeon: Newt Minion, MD;  Location: [facility];  Service: Orthopedics;  Laterality: Left;  . AMPUTATION Left 09/11/2016   Procedure: Left Transmetatarsal Amputation;  Surgeon: Newt Minion, MD;  Location: [facility];  Service: Orthopedics;  Laterality: Left;  . appendectomy    . APPENDECTOMY    . COLONOSCOPY    . HERNIA REPAIR Bilateral    Inguinal  . I&D EXTREMITY Left 10/12/2014   Procedure: IRRIGATION AND DEBRIDEMENT INDEX FINGER;  Surgeon: Iran Planas, MD;  Location: [facility];  Service: Orthopedics;  Laterality: Left;  . KYPHOPLASTY  2012 ?   T 12- L1  . LAMINECTOMY  2005 ?   lumbar- 4-5  . PROSTATE SURGERY    . ROTATOR CUFF REPAIR Right   . STUMP REVISION Left 12/11/2016   Procedure: Revision Left Transmetatarsal Amputation, Apply Wound VAC;  Surgeon: Newt Minion, MD;  Location: [facility];  Service: Orthopedics;  Laterality: Left;  . TOTAL KNEE ARTHROPLASTY Right      reports that he has never smoked. He has never used smokeless tobacco. He reports that he drinks about 1.2 oz of alcohol per week . He reports that he does not use drugs.  Allergies  Allergen Reactions  . Tape Other (See Comments)  PATIENT'S SKIN IS VERY THIN AND WILL TEAR AND BRUISE VERY EASILY!! Paper take is ok    Family History  Problem Relation Age of Onset  . Heart disease Father   . Heart attack Father   . Hypertension Father   . Heart disease Brother   . Heart disease Paternal Grandfather   . Stroke Mother   . Parkinsonism Sister   . Heart disease Sister   . Dementia Sister      Prior to Admission medications   Medication Sig Start Date End Date Taking? Authorizing Provider  Cholecalciferol (VITAMIN D) 2000 units CAPS Take 2,000 Units by mouth daily.   Yes [provider]  escitalopram (LEXAPRO) 5 MG tablet Take 5 mg by mouth daily.  09/15/16  Yes [provider]  FIBER PO Take 1 tablet by mouth 3  (three) times daily.    Yes [provider]  fluticasone (FLONASE) 50 MCG/ACT nasal spray Place 2 sprays into both nostrils 2 (two) times daily.    Yes [provider]  furosemide (LASIX) 40 MG tablet Take 1 tablet (40 mg total) by mouth daily. Patient taking differently: Take 20-40 mg by mouth See admin instructions. Takes 40 mg in morning at 20 mg at lunch 03/10/17 06/08/17 Yes Belva Crome, MD  guaiFENesin (MUCINEX) 600 MG 12 hr tablet Take 600 mg by mouth at bedtime as needed for to loosen phlegm.    Yes [provider]  HYDROcodone-acetaminophen (NORCO) 10-325 MG tablet Take 1 tablet by mouth every 6 (six) hours as needed (for pain). 12/16/16  Yes Dhungel, Nishant, MD  metoprolol tartrate (LOPRESSOR) 25 MG tablet Take 25 mg by mouth 2 (two) times daily. 01/07/17  Yes [provider]  nitroGLYCERIN (NITRODUR - DOSED IN MG/24 HR) 0.2 mg/hr patch Apply one (1) patch to the left foot every other day. 01/22/17  Yes [provider]  pantoprazole (PROTONIX) 40 MG tablet Take 1 tablet (40 mg total) by mouth daily. 10/02/16  Yes Rai, Ripudeep K, MD  polyethylene glycol (MIRALAX / GLYCOLAX) packet Take 17 g by mouth daily as needed for moderate constipation. For constipation Patient taking differently: Take 17 g by mouth daily as needed for mild constipation or moderate constipation.  10/02/16  Yes Rai, Ripudeep K, MD  potassium chloride SA (K-DUR,KLOR-CON) 20 MEQ tablet Take 1 tablet (20 mEq total) by mouth daily. Patient taking differently: Take 20 mEq by mouth daily with lunch.  10/02/16  Yes Rai, Ripudeep K, MD  predniSONE (DELTASONE) 10 MG tablet Take 1 tablet (10 mg total) by mouth daily with breakfast. Patient taking differently: Take 17.5 mg by mouth every morning. Takes 17.5 in the morning 03/09/17  Yes Florencia Reasons, MD  vitamin B-12 (CYANOCOBALAMIN) 1000 MCG tablet Take 1,000 mcg by mouth daily with lunch.    Yes [provider]  warfarin (COUMADIN) 5 MG  tablet TAKE AS DIRECTED BY COUMADIN CLINIC Patient taking differently: 5 mg on Monday and Friday//// 2.5 Tues. Wed. Thurs. Sat and sunday takes in evening 03/26/17  Yes Belva Crome, MD    Physical Exam: Vitals:   04/17/17 2015 04/17/17 2045 04/17/17 2143 04/17/17 2145  BP:  109/73 110/78 109/85  Pulse: (!) 103 (!) 103  (!) 105  Resp:   20 (!) 22  Temp:      TempSrc:      SpO2:   90% 91%    Constitutional: NAD, mild agitation Eyes: PERRL, lids and conjunctivae normal ENMT: Mucous membranes are  moist. Posterior pharynx clear of any exudate or lesions.Normal dentition.  Neck: normal, supple, no masses, no thyromegaly Respiratory: clear to auscultation bilaterally  Cardiovascular: Regular rate and rhythm, no murmurs / rubs / gallops. No extremity edema. 2+ pedal pulses. No carotid bruits.  Abdomen: no tenderness, no masses palpated. No hepatosplenomegaly. Bowel sounds positive.  Musculoskeletal: no clubbing / cyanosis. No joint deformity upper and lower extremities. Good ROM, no contractures. Normal muscle tone.  Skin: No obvious cellulitis of either foot, LLE s/p transmetatarsal Neurologic: CN 2-12 grossly intact. Sensation intact, DTR normal. Strength 5/5 in all 4.  Psychiatric: Normal judgment and insight. Alert and oriented x 3. Normal mood.    Labs on Admission: I have personally reviewed following labs and imaging studies  CBC:  Recent Labs Lab 04/17/17 1846  WBC 7.7  NEUTROABS 6.2  HGB 11.1*  HCT 35.3*  MCV 92.2  PLT 160   Basic Metabolic Panel:  Recent Labs Lab 04/17/17 1846  NA 139  K 3.5  CL 106  CO2 27  GLUCOSE 157*  BUN 19  CREATININE 1.13  CALCIUM 8.6*  MG 2.1   GFR: CrCl cannot be calculated (Unknown ideal weight.). Liver Function Tests:  Recent Labs Lab 04/17/17 1846  AST 22  ALT 13*  ALKPHOS 44  BILITOT 0.9  PROT 5.6*  ALBUMIN 3.4*   No results for input(s): LIPASE, AMYLASE in the last 168 hours. No results for input(s): AMMONIA in  the last 168 hours. Coagulation Profile:  Recent Labs Lab 04/17/17 1846  INR 2.14   Cardiac Enzymes: No results for input(s): CKTOTAL, CKMB, CKMBINDEX, TROPONINI in the last 168 hours. BNP (last 3 results) No results for input(s): PROBNP in the last 8760 hours. HbA1C: No results for input(s): HGBA1C in the last 72 hours. CBG: No results for input(s): GLUCAP in the last 168 hours. Lipid Profile: No results for input(s): CHOL, HDL, LDLCALC, TRIG, CHOLHDL, LDLDIRECT in the last 72 hours. Thyroid Function Tests: No results for input(s): TSH, T4TOTAL, FREET4, T3FREE, THYROIDAB in the last 72 hours. Anemia Panel: No results for input(s): VITAMINB12, FOLATE, FERRITIN, TIBC, IRON, RETICCTPCT in the last 72 hours. Urine analysis:    Component Value Date/Time   COLORURINE YELLOW 04/17/2017 1820   APPEARANCEUR CLEAR 04/17/2017 1820   LABSPEC 1.019 04/17/2017 1820   PHURINE 5.0 04/17/2017 1820   GLUCOSEU NEGATIVE 04/17/2017 1820   HGBUR NEGATIVE 04/17/2017 1820   BILIRUBINUR NEGATIVE 04/17/2017 1820   KETONESUR NEGATIVE 04/17/2017 1820   PROTEINUR NEGATIVE 04/17/2017 1820   NITRITE NEGATIVE 04/17/2017 1820   LEUKOCYTESUR NEGATIVE 04/17/2017 1820    Radiological Exams on Admission: Dg Chest 2 View  Result Date: 04/17/2017 CLINICAL DATA:  Altered mental status, shortness of breath for several days. History of CHF. EXAM: CHEST  2 VIEW COMPARISON:  Chest x-rays dated 03/07/2017 and 09/20/2016. FINDINGS: Cardiomegaly appears stable. Overall cardiomediastinal silhouette appears stable. Aortic atherosclerosis. Extensive bilateral calcified pleural plaques are stable. Lungs otherwise clear. No pleural effusion or pneumothorax seen. No acute or suspicious osseous finding. IMPRESSION: No active cardiopulmonary disease. No evidence of pneumonia or pulmonary edema. Stable cardiomegaly. Stable bilateral calcified pleural plaques. Electronically Signed   By: Franki Cabot M.D.   On: 04/17/2017 18:46     Ct Head Wo Contrast  Result Date: 04/17/2017 CLINICAL DATA:  Altered level of consciousness, confusion over the last 3 days EXAM: CT HEAD WITHOUT CONTRAST TECHNIQUE: Contiguous axial images were obtained from the base of the skull through the vertex without intravenous contrast.  COMPARISON:  11/22/2011 FINDINGS: Brain: Extensive brain atrophy and chronic white matter microvascular ischemic changes throughout both cerebral hemispheres. No acute intracranial hemorrhage, mass lesion, midline shift, herniation, hydrocephalus or extra-axial fluid collection. No focal mass effect or edema. Cisterns are patent. Cerebellar atrophy as well. Vascular: Intracranial atherosclerosis noted.  No hyperdense vessel. Skull: Normal. Negative for fracture or focal lesion. Sinuses/Orbits: No acute orbital findings. Minor scattered sinus mucosal thickening. No sinus air-fluid levels or hemorrhage. Mastoids are clear. Other: None. IMPRESSION: Stable brain atrophy and chronic white matter microvascular changes. No interval change or acute process by noncontrast CT. Electronically Signed   By: Jerilynn Mages.  Shick M.D.   On: 04/17/2017 19:39    EKG: Independently reviewed.  Assessment/Plan Principal Problem:   Altered mental status Active Problems:   Chronic atrial fibrillation (HCC)   Myasthenia gravis (HCC)   Chronic systolic CHF (congestive heart failure) ([place])    1. AMS - acting like delirium but no clear acute medical condition at this point 1. MRI brain 2. Cortisol level 3. TSH 4. BCx - no SIRS other than mild RVR from A.Fib, no source of infection this time around. 2. Myasthenia gravis - will re-increase prednisone to 20mg  PO daily for the moment 3. A.Fib - 1. Continue coumadin 2. Continue metoprolol for rate control 4. Chronic CHF - 1. Holding lasix for the moment since he just got fluids 2. Checking BNP  DVT prophylaxis: Coumadin Code Status: Partial - DNR/DNI but may use pressors Family Communication: No  family in room Disposition Plan: TBD Consults called: None Admission status: Place in obs   Mazelle Huebert, [facility] Pager [phone]  If 7AM-7PM, please contact day team taking care of patient [email or web address] Password [email or web address]  04/17/2017, 10:02 PM

## 2017-04-17 NOTE — ED Notes (Signed)
Patient transported to MRI 

## 2017-04-17 NOTE — Progress Notes (Signed)
ANTICOAGULATION CONSULT NOTE - Initial Consult  Pharmacy Consult for warfarin Indication: atrial fibrillation  Allergies  Allergen Reactions  . Tape Other (See Comments)    PATIENT'S SKIN IS VERY THIN AND WILL TEAR AND BRUISE VERY EASILY!! Paper take is ok    Patient Measurements:   Vital Signs: Temp: 98.3 F (36.8 C) (09/20 1642) Temp Source: Oral (09/20 1642) BP: 109/85 (09/20 2145) Pulse Rate: 105 (09/20 2145)  Labs:  Recent Labs  04/17/17 1846  HGB 11.1*  HCT 35.3*  PLT 199  LABPROT 23.7*  INR 2.14  CREATININE 1.13    CrCl cannot be calculated (Unknown ideal weight.).   Medical History: Past Medical History:  Diagnosis Date  . Atrial fibrillation ([place])   . Cellulitis    left arm  . CHF (congestive heart failure) ([place])   . Dyspnea    oxygen prn  . Dysrhythmia    afib  . GERD (gastroesophageal reflux disease)    ocassional  . History of kidney stones    passed  . Myasthenia gravis   . Osteoarthritis   . Pneumonia    Hstory of  . 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 Prostate cancer ([place]) dx'd 1982   treated with surgery and radiation.  Basal cell cancer- many  . Sepsis ([place])   . Urine incontinence   . Varicose veins of left lower extremity      Assessment: 83 yo male sent from PCP office due to AFib with RVR. Pt is on warfarin prior to admission for AFib. Current INR 2.1, patient already took his dose today.  PTA warfarin: 5 mg on Mon/Fri, 2.5 mg on all other days  Goal of Therapy:  INR 2-3 Monitor platelets by anticoagulation protocol: Yes    Plan:  -Pt already took dose for today -Daily INR  Harvel Quale 04/17/2017,10:02 PM

## 2017-04-17 NOTE — ED Notes (Signed)
Need blood for vbg

## 2017-04-17 NOTE — ED Notes (Signed)
Patient transported to X-ray 

## 2017-04-17 NOTE — ED Notes (Signed)
Dr. Billy Fischer advised of + lactic result

## 2017-04-17 NOTE — ED Triage Notes (Signed)
Pt arrived via [facility] from primary care office.  Reported to EMS A-fib with RVR in office with confusion 2-3 days.  Pt A&Ox4 on arrival.

## 2017-04-18 DIAGNOSIS — I482 Chronic atrial fibrillation: Secondary | ICD-10-CM | POA: Diagnosis not present

## 2017-04-18 DIAGNOSIS — I5022 Chronic systolic (congestive) heart failure: Secondary | ICD-10-CM | POA: Diagnosis not present

## 2017-04-18 DIAGNOSIS — G7000 Myasthenia gravis without (acute) exacerbation: Secondary | ICD-10-CM

## 2017-04-18 DIAGNOSIS — R410 Disorientation, unspecified: Secondary | ICD-10-CM | POA: Diagnosis not present

## 2017-04-18 LAB — CBC
HEMATOCRIT: 34.3 % — AB (ref 39.0–52.0)
HEMOGLOBIN: 10.7 g/dL — AB (ref 13.0–17.0)
MCH: 28.9 pg (ref 26.0–34.0)
MCHC: 31.2 g/dL (ref 30.0–36.0)
MCV: 92.7 fL (ref 78.0–100.0)
PLATELETS: 168 10*3/uL (ref 150–400)
RBC: 3.7 MIL/uL — AB (ref 4.22–5.81)
RDW: 16.6 % — ABNORMAL HIGH (ref 11.5–15.5)
WBC: 8.7 10*3/uL (ref 4.0–10.5)

## 2017-04-18 LAB — BLOOD GAS, VENOUS
Acid-Base Excess: 2.9 mmol/L — ABNORMAL HIGH (ref 0.0–2.0)
Bicarbonate: 27.3 mmol/L (ref 20.0–28.0)
O2 Saturation: 83.4 %
PATIENT TEMPERATURE: 98.6
PCO2 VEN: 44.9 mmHg (ref 44.0–60.0)
PO2 VEN: 52 mmHg — AB (ref 32.0–45.0)
pH, Ven: 7.401 (ref 7.250–7.430)

## 2017-04-18 LAB — BASIC METABOLIC PANEL
ANION GAP: 8 (ref 5–15)
BUN: 17 mg/dL (ref 6–20)
CHLORIDE: 106 mmol/L (ref 101–111)
CO2: 25 mmol/L (ref 22–32)
Calcium: 8.3 mg/dL — ABNORMAL LOW (ref 8.9–10.3)
Creatinine, Ser: 1.02 mg/dL (ref 0.61–1.24)
GFR calc Af Amer: >60 mL/min (ref >60)
GLUCOSE: 88 mg/dL (ref 65–99)
POTASSIUM: 3.4 mmol/L — AB (ref 3.5–5.1)
Sodium: 139 mmol/L (ref 135–145)

## 2017-04-18 LAB — PROTIME-INR
INR: 2.18
Prothrombin Time: 24.1 seconds — ABNORMAL HIGH (ref 11.4–15.2)

## 2017-04-18 MED ORDER — WARFARIN SODIUM 5 MG PO TABS
5.0000 mg | ORAL_TABLET | Freq: Once | ORAL | Status: DC
  Filled 2017-04-18: qty 1

## 2017-04-18 MED ORDER — SODIUM CHLORIDE 0.9 % IV BOLUS (SEPSIS)
500.0000 mL | Freq: Once | INTRAVENOUS | Status: AC
  Administered 2017-04-18: 500 mL via INTRAVENOUS

## 2017-04-18 NOTE — Progress Notes (Addendum)
VC 1160 NIF - 24  With good pt effort.

## 2017-04-18 NOTE — ED Notes (Signed)
Contacted by Main Lab, stating that PT-INR sample had "too little in the tube" for an accurate sample. Drew additional blue top, confirming amount with [place], NT & Kayla, phlebotomist. Sent to lab for processing.

## 2017-04-18 NOTE — Discharge Summary (Addendum)
Physician Discharge Summary  Richard Bradley ZOX:[id number] DOB: 06/14/1926 DOA: 04/17/2017  PCP: Josetta Huddle, MD  Admit date: 04/17/2017 Discharge date: 04/18/2017  Admitted From: home  Disposition:  home  Recommendations for Outpatient Follow-up:  1.  Call PCP's office on Monday for further instructions regarding lasix 2.  Advised family to hold lasix through weekend, unless he becomes more SOB, then resume 60mg  once daily  Home Health:  Already have [facility] PT  Equipment/Devices:  Already have oxygen at home  Discharge Condition:  Stable, improved CODE STATUS:  partial  Diet recommendation:  regular   Brief/Interim Summary:  The patient is an 83 year old male with history of myasthenia gravis, atrial fibrillation on Coumadin, chronic deconditioning mostly bedbound who presented with increased fatigue and confusion. He sometimes gets confused when he has an underlying infection and his family was concerned so they brought him to the emergency department. In the emergency department, he was afebrile, chest x-ray, head CT, CBC, BMP, and urinalysis were unremarkable. His BUN and creatinine are mildly higher than they were a few months ago. His lactic acid was 2.95. He was given a normal saline bolus and started to become more awake and alert.  Initially he was going to be observed overnight in the hospital, however with his daughter returned she felt like he was back to his baseline and since he did not have evidence of a UTI or pneumonia, she felt comfortable returning home. I have ordered him to have another 500 mL of IV fluids and asked her to not give him his Lasix on Saturday and Sunday. They should call the primary care doctor's office on Monday for further instructions about his diuretic. If he becomes more [name] of breath over the weekend, they should resume Lasix 60 mg once daily and call the office on Monday. If he becomes considerably more [name] of breath they should return to the  emergency department.  Discharge Diagnoses:  Principal Problem:   Altered mental status Active Problems:   Chronic atrial fibrillation (HCC)   Myasthenia gravis (HCC)   Chronic systolic CHF (congestive heart failure) (HCC)  Delirium, unclear etiology.  No signs of pneumonia or urinary tract infection. He is afebrile. His head CT was unremarkable and follow-up brain MRI demonstrated no acute strokes. TSH was within normal limits.  His mentation improved with IV fluids.  Per his daughter, although he is slurring his words and mumbling to me, she states that this is his normal speech pattern and that he is no longer confused.  If he becomes more confused, somnolent, and Shellby Schlink of breath, would be concerned for low cardiac output heart failure and would recommend rehospitalization for further testing.    Chronic systolic heart failure EF 35-40%, possibly mildly dehydrated, causing some confusion -  Holding lasix for 2 days, then resume  Chronic atrial fibrillation, variable heart rates in the emergency department but generally between 80 and low 100s - No changes were made to his anticoagulation or rate control medications  Myasthenia gravis, likely contributing to his mumbled words.  ABG demonstrated no evidence of hypercapnia or hypoxia. -  Continued prednisone but increased to 20mg  daily   Discharge Instructions  Discharge Instructions    Call MD for:  difficulty breathing, headache or visual disturbances    Complete by:  As directed    Call MD for:  extreme fatigue    Complete by:  As directed    Call MD for:  persistant dizziness or light-headedness  Complete by:  As directed    Call MD for:  persistant nausea and vomiting    Complete by:  As directed    Call MD for:  severe uncontrolled pain    Complete by:  As directed    Call MD for:  temperature >100.4    Complete by:  As directed    Diet - low sodium heart healthy    Complete by:  As directed    Discharge instructions     Complete by:  As directed    Please do not administer Lasix on Saturday or Sunday. Call Dr. Inda Merlin office on Monday for further instructions. If he becomes more [name] of breath, resume Lasix 60 mg once daily as before and call Dr. Inda Merlin office on Monday.  Please hold potassium on any days that you do not take Lasix. Do not take potassium on Saturday or Sunday.   Increase activity slowly    Complete by:  As directed        Medication List    STOP taking these medications   furosemide 40 MG tablet Commonly known as:  LASIX   potassium chloride SA 20 MEQ tablet Commonly known as:  K-DUR,KLOR-CON     TAKE these medications   escitalopram 5 MG tablet Commonly known as:  LEXAPRO Take 5 mg by mouth daily.   FIBER PO Take 1 tablet by mouth 3 (three) times daily.   fluticasone 50 MCG/ACT nasal spray Commonly known as:  FLONASE Place 2 sprays into both nostrils 2 (two) times daily.   guaiFENesin 600 MG 12 hr tablet Commonly known as:  MUCINEX Take 600 mg by mouth at bedtime as needed for to loosen phlegm.   HYDROcodone-acetaminophen 10-325 MG tablet Commonly known as:  NORCO Take 1 tablet by mouth every 6 (six) hours as needed (for pain).   metoprolol tartrate 25 MG tablet Commonly known as:  LOPRESSOR Take 25 mg by mouth 2 (two) times daily.   nitroGLYCERIN 0.2 mg/hr patch Commonly known as:  NITRODUR - Dosed in mg/24 hr Apply one (1) patch to the left foot every other day.   pantoprazole 40 MG tablet Commonly known as:  PROTONIX Take 1 tablet (40 mg total) by mouth daily.   polyethylene glycol packet Commonly known as:  MIRALAX / GLYCOLAX Take 17 g by mouth daily as needed for moderate constipation. For constipation What changed:  reasons to take this  additional instructions   predniSONE 10 MG tablet Commonly known as:  DELTASONE Take 2 tablets (20 mg total) by mouth daily with breakfast. What changed:  how much to take   vitamin B-12 1000 MCG tablet Commonly  known as:  CYANOCOBALAMIN Take 1,000 mcg by mouth daily with lunch.   Vitamin D 2000 units Caps Take 2,000 Units by mouth daily.   warfarin 5 MG tablet Commonly known as:  COUMADIN TAKE AS DIRECTED BY COUMADIN CLINIC What changed:  See the new instructions.      Follow-up Information    Josetta Huddle, MD. Call.   Specialty:  Internal Medicine Why:  Call on Monday for further instructions Contact information: [address] [place] [address] [phone]          Allergies  Allergen Reactions  . Tape Other (See Comments)    PATIENT'S SKIN IS VERY THIN AND WILL TEAR AND BRUISE VERY EASILY!! Paper take is ok    Consultations: None   Procedures/Studies: Dg Chest 2 View  Result Date: 04/17/2017  CLINICAL DATA:  Altered mental status, shortness of breath for several days. History of CHF. EXAM: CHEST  2 VIEW COMPARISON:  Chest x-rays dated 03/07/2017 and 09/20/2016. FINDINGS: Cardiomegaly appears stable. Overall cardiomediastinal silhouette appears stable. Aortic atherosclerosis. Extensive bilateral calcified pleural plaques are stable. Lungs otherwise clear. No pleural effusion or pneumothorax seen. No acute or suspicious osseous finding. IMPRESSION: No active cardiopulmonary disease. No evidence of pneumonia or pulmonary edema. Stable cardiomegaly. Stable bilateral calcified pleural plaques. Electronically Signed   By: Franki Cabot M.D.   On: 04/17/2017 18:46   Ct Head Wo Contrast  Result Date: 04/17/2017 CLINICAL DATA:  Altered level of consciousness, confusion over the last 3 days EXAM: CT HEAD WITHOUT CONTRAST TECHNIQUE: Contiguous axial images were obtained from the base of the skull through the vertex without intravenous contrast. COMPARISON:  11/22/2011 FINDINGS: Brain: Extensive brain atrophy and chronic white matter microvascular ischemic changes throughout both cerebral hemispheres. No acute intracranial hemorrhage, mass lesion, midline shift,  herniation, hydrocephalus or extra-axial fluid collection. No focal mass effect or edema. Cisterns are patent. Cerebellar atrophy as well. Vascular: Intracranial atherosclerosis noted.  No hyperdense vessel. Skull: Normal. Negative for fracture or focal lesion. Sinuses/Orbits: No acute orbital findings. Minor scattered sinus mucosal thickening. No sinus air-fluid levels or hemorrhage. Mastoids are clear. Other: None. IMPRESSION: Stable brain atrophy and chronic white matter microvascular changes. No interval change or acute process by noncontrast CT. Electronically Signed   By: Jerilynn Mages.  Shick M.D.   On: 04/17/2017 19:39   Mr Brain Wo Contrast  Result Date: 04/18/2017 CLINICAL DATA:  Altered mental status EXAM: MRI HEAD WITHOUT CONTRAST TECHNIQUE: Multiplanar, multiecho pulse sequences of the brain and surrounding structures were obtained without intravenous contrast. COMPARISON:  Head CT 04/17/2017 FINDINGS: The study is degraded by motion, despite efforts to reduce this artifact, including utilization of motion-resistant MR sequences. The findings of the study are interpreted in the context of reduced sensitivity/specificity. Brain: The midline structures are normal. There is no focal diffusion restriction to indicate acute infarct. There is diffuse confluent hyperintense T2-weighted signal within the periventricular and deep white matter, most often seen in the setting of chronic microvascular ischemia. No intraparenchymal hematoma or chronic microhemorrhage. Advanced diffuse atrophy. The dura is normal and there is no extra-axial collection. Vascular: Major intracranial arterial and venous sinus flow voids are preserved. Skull and upper cervical spine: The visualized skull base, calvarium, upper cervical spine and extracranial soft tissues are normal. Sinuses/Orbits: No fluid levels or advanced mucosal thickening. No mastoid or middle ear effusion. Normal orbits. IMPRESSION: 1. Markedly motion degraded  examination. 2. Within that limitation, no acute abnormality is identified. 3. Diffuse atrophy and findings of chronic ischemic microangiopathy. Electronically Signed   By: Ulyses Jarred M.D.   On: 04/18/2017 00:32    Subjective: Feeling much better.  Denies pain, SOB, nausea.  Feels like he is tired, but otherwise he feels his thinking is clear.  Daughter agrees that he is back to baseline.  Discharge Exam: Vitals:   04/18/17 1100 04/18/17 1230  BP: 102/71 106/72  Pulse: 98 86  Resp: (!) 30 (!) 35  Temp:    SpO2: 99% (!) 89%   Vitals:   04/18/17 0930 04/18/17 1026 04/18/17 1100 04/18/17 1230  BP: (!) 133/97 101/72 102/71 106/72  Pulse:  99 98 86  Resp:  (!) 29 (!) 30 (!) 35  Temp:      TempSrc:      SpO2: 94% 95% 99% (!) 89%  General: Pt is alert, awake, shallow breathing, mildly tachypneic (baseline per daughter) Cardiovascular: IRRR, 2/6 systolic murmur, no rubs, no gallops Respiratory: CTA bilaterally anteriorly.  no wheezing, no rhonchi Abdominal: Soft, NT, ND, bowel sounds + Extremities: no edema, no cyanosis.  Left foot with transmetatarsal amputation which is partially healed. He has a 2 cm area of the incision that must have previously dehisced and which is healing by secondary intention. There was saline gauze packed in the incision which I removed. There is no foul odor, discharge, the base had healthy granulation tissue. There is no surrounding erythema, induration, or warmth. Neuro:  Diffusely weak.   The results of significant diagnostics from this hospitalization (including imaging, microbiology, ancillary and laboratory) are listed below for reference.     Microbiology: No results found for this or any previous visit (from the past 240 hour(s)).   Labs: BNP (last 3 results)  Recent Labs  09/26/16 1105 04/17/17 2203  BNP 624.0* 778.2*   Basic Metabolic Panel:  Recent Labs Lab 04/17/17 1846 04/18/17 0403  NA 139 139  K 3.5 3.4*  CL 106 106  CO2  27 25  GLUCOSE 157* 88  BUN 19 17  CREATININE 1.13 1.02  CALCIUM 8.6* 8.3*  MG 2.1  --    Liver Function Tests:  Recent Labs Lab 04/17/17 1846  AST 22  ALT 13*  ALKPHOS 44  BILITOT 0.9  PROT 5.6*  ALBUMIN 3.4*   No results for input(s): LIPASE, AMYLASE in the last 168 hours. No results for input(s): AMMONIA in the last 168 hours. CBC:  Recent Labs Lab 04/17/17 1846 04/18/17 0403  WBC 7.7 8.7  NEUTROABS 6.2  --   HGB 11.1* 10.7*  HCT 35.3* 34.3*  MCV 92.2 92.7  PLT 199 168   Cardiac Enzymes: No results for input(s): CKTOTAL, CKMB, CKMBINDEX, TROPONINI in the last 168 hours. BNP: Invalid input(s): POCBNP CBG: No results for input(s): GLUCAP in the last 168 hours. D-Dimer No results for input(s): DDIMER in the last 72 hours. Hgb A1c No results for input(s): HGBA1C in the last 72 hours. Lipid Profile No results for input(s): CHOL, HDL, LDLCALC, TRIG, CHOLHDL, LDLDIRECT in the last 72 hours. Thyroid function studies  Recent Labs  04/17/17 2158  TSH 0.981   Anemia work up No results for input(s): VITAMINB12, FOLATE, FERRITIN, TIBC, IRON, RETICCTPCT in the last 72 hours. Urinalysis    Component Value Date/Time   COLORURINE YELLOW 04/17/2017 1820   APPEARANCEUR CLEAR 04/17/2017 1820   LABSPEC 1.019 04/17/2017 1820   PHURINE 5.0 04/17/2017 1820   GLUCOSEU NEGATIVE 04/17/2017 1820   HGBUR NEGATIVE 04/17/2017 1820   BILIRUBINUR NEGATIVE 04/17/2017 1820   KETONESUR NEGATIVE 04/17/2017 1820   PROTEINUR NEGATIVE 04/17/2017 1820   NITRITE NEGATIVE 04/17/2017 1820   LEUKOCYTESUR NEGATIVE 04/17/2017 1820   Sepsis Labs Invalid input(s): PROCALCITONIN,  WBC,  LACTICIDVEN   Time coordinating discharge: Over 30 minutes  SIGNED:   Janece Canterbury, MD  [facility] 04/18/2017, 1:27 PM Pager   If 7PM-7AM, please contact night-coverage [email or web address] Password [email or web address]

## 2017-04-18 NOTE — Progress Notes (Signed)
[facility] for warfarin Indication: atrial fibrillation  Allergies  Allergen Reactions  . Tape Other (See Comments)    PATIENT'S SKIN IS VERY THIN AND WILL TEAR AND BRUISE VERY EASILY!! Paper take is ok    Patient Measurements:   Vital Signs: BP: 116/63 (09/21 0830) Pulse Rate: 80 (09/21 0830)  Labs:  Recent Labs  04/17/17 1846 04/18/17 0403 04/18/17 0443  HGB 11.1* 10.7*  --   HCT 35.3* 34.3*  --   PLT 199 168  --   LABPROT 23.7*  --  24.1*  INR 2.14  --  2.18  CREATININE 1.13 1.02  --     CrCl cannot be calculated (Unknown ideal weight.).   Medical History: Past Medical History:  Diagnosis Date  . Atrial fibrillation ([place])   . Cellulitis    left arm  . CHF (congestive heart failure) ([place])   . Dyspnea    oxygen prn  . Dysrhythmia    afib  . GERD (gastroesophageal reflux disease)    ocassional  . History of kidney stones    passed  . Myasthenia gravis   . Osteoarthritis   . Pneumonia    Hstory of  . 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 Prostate cancer ([place]) dx'd 1982   treated with surgery and radiation.  Basal cell cancer- many  . Sepsis ([place])   . Urine incontinence   . Varicose veins of left lower extremity      Assessment: 83 yo male sent from PCP office due to AFib with RVR. Pt is on warfarin prior to admission for AFib. INR 2.14 on admit - last dose 9/20 PTA. INR remains therapeutic at 2.18. CBC stable. No bleed documented.  PTA warfarin: 5 mg on Mon/Fri, 2.5 mg on all other days  Goal of Therapy:  INR 2-3 Monitor platelets by anticoagulation protocol: Yes    Plan:  -Warfarin 5mg  PO x 1 -Daily INR -Monitor CBC, s/sx bleeding  Elicia Lamp, PharmD, BCPS Clinical Pharmacist 04/18/2017 8:51 AM

## 2017-04-18 NOTE — ED Notes (Signed)
Breakfast tray at bedside 

## 2017-04-18 NOTE — ED Notes (Signed)
Phill Mutter (daughter) [phone]

## 2017-04-18 NOTE — ED Notes (Signed)
This RN spoke with admitting MD. Plan is to give bolus and D/C home with dtr from the ED.

## 2017-04-19 LAB — URINE CULTURE

## 2017-04-20 ENCOUNTER — Telehealth: Payer: Self-pay

## 2017-04-20 NOTE — Telephone Encounter (Signed)
No treatment needed for UC ED 04/18/17 Per [facility] Pharm D

## 2017-04-22 LAB — CULTURE, BLOOD (ROUTINE X 2)
CULTURE: NO GROWTH
CULTURE: NO GROWTH
Special Requests: ADEQUATE
Special Requests: ADEQUATE

## 2017-05-01 ENCOUNTER — Encounter (INDEPENDENT_AMBULATORY_CARE_PROVIDER_SITE_OTHER): Payer: Self-pay | Admitting: Orthopedic Surgery

## 2017-05-01 ENCOUNTER — Ambulatory Visit (INDEPENDENT_AMBULATORY_CARE_PROVIDER_SITE_OTHER): Payer: Medicare Other | Admitting: Orthopedic Surgery

## 2017-05-01 DIAGNOSIS — T8131XD Disruption of external operation (surgical) wound, not elsewhere classified, subsequent encounter: Secondary | ICD-10-CM | POA: Diagnosis not present

## 2017-05-01 DIAGNOSIS — Z89432 Acquired absence of left foot: Secondary | ICD-10-CM

## 2017-05-01 NOTE — Progress Notes (Signed)
Office Visit Note   Patient: Richard Bradley           Date of Birth: 1925-08-21           MRN: [id number] Visit Date: 05/01/2017              Requested by: Josetta Huddle, MD [address] [place], [place] [address] PCP: Josetta Huddle, MD  Chief Complaint  Patient presents with  . Left Foot - Follow-up      HPI: Patient returns in follow-up for wound dehiscence left transmetatarsal amputation.  Assessment & Plan: Visit Diagnoses:  1. S/P transmetatarsal amputation of foot, left ([place])   2. Postoperative wound dehiscence, subsequent encounter     Plan: Continue with nitroglycerin patches for the left foot and continue with Silvadene dressing changes. Return for any concerns of infection. Follow up in 2 months. Have provided an order to [facility] for custom spacer and orthotic with carbon fiber plate.   Follow-Up Instructions: Return in about 2 months (around 07/01/2017).   Ortho Exam  Patient is alert, oriented, no adenopathy, well-dressed, normal affect, normal respiratory effort. Examination patient ambulates in a wheelchair. There is no redness no cellulitis on either lower extremity left foot wound dehiscence is healing slowly externally measures 5 x 12 mm and 3 mm deep is good granulation tissue at the base no exposed bone or tendon.   Imaging: No results found. No images are attached to the encounter.  Labs: Lab Results  Component Value Date   HGBA1C 5.6 09/26/2011   ESRSEDRATE 7 05/24/2016   ESRSEDRATE 29 (H) 10/12/2014   ESRSEDRATE 8 09/26/2011   CRP 6.4 (H) 05/24/2016   CRP 14.0 (H) 10/12/2014   CRP 0.02 09/26/2011   REPTSTATUS 04/22/2017 FINAL 04/17/2017   GRAMSTAIN  10/12/2014    RARE WBC PRESENT,BOTH PMN AND MONONUCLEAR NO ORGANISMS SEEN Performed at [facility]  10/12/2014    RARE WBC PRESENT,BOTH PMN AND MONONUCLEAR NO ORGANISMS SEEN Performed at [facility]    CULT NO GROWTH 5 DAYS 04/17/2017   LABORGA  PSEUDOMONAS AERUGINOSA (A) 12/12/2016    Orders:  No orders of the defined types were placed in this encounter.  No orders of the defined types were placed in this encounter.    Procedures: No procedures performed  Clinical Data: No additional findings.  ROS:  All other systems negative, except as noted in the HPI. Review of Systems  Constitutional: Negative for chills and fever.  Cardiovascular: Negative for leg swelling.  Skin: Positive for wound.    Objective: Vital Signs: There were no vitals taken for this visit.  Specialty Comments:  No specialty comments available.  PMFS History: Patient Active Problem List   Diagnosis Date Noted  . Sepsis affecting skin ([place]) 03/07/2017  . Cellulitis of right lower extremity   . Hypotension   . S/P transmetatarsal amputation of foot, left ([place]) 02/06/2017  . Pseudomonas urinary tract infection 12/16/2016  . Altered mental status   . Sepsis ([place]) 12/12/2016  . Chronic systolic CHF (congestive heart failure) ([place]) 12/12/2016  . Wound dehiscence, surgical 10/03/2016  . Acute respiratory distress   . Goals of care, counseling/discussion   . Palliative care by specialist   . Hypokalemia 09/21/2016  . Benign essential HTN 09/21/2016  . Anticoagulated on Coumadin 12/20/2013  . Myasthenia gravis ([place]) 06/27/2011  . Chronic atrial fibrillation [facility])    Past Medical History:  Diagnosis Date  . Atrial fibrillation (  HCC)   . Cellulitis    left arm  . CHF (congestive heart failure) ([place])   . Dyspnea    oxygen prn  . Dysrhythmia    afib  . GERD (gastroesophageal reflux disease)    ocassional  . History of kidney stones    passed  . Myasthenia gravis   . Osteoarthritis   . Pneumonia    Hstory of  . 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 Prostate cancer ([place]) dx'd 1982   treated with surgery and radiation.  Basal cell cancer- many  . Sepsis ([place])   . Urine incontinence   . Varicose veins of left lower  extremity     Family History  Problem Relation Age of Onset  . Heart disease Father   . Heart attack Father   . Hypertension Father   . Heart disease Brother   . Heart disease Paternal Grandfather   . Stroke Mother   . Parkinsonism Sister   . Heart disease Sister   . Dementia Sister     Past Surgical History:  Procedure Laterality Date  . AMPUTATION Left 03/20/2016   Procedure: Left Great Toe Amputation at Metatarsophalangeal Joint;  Surgeon: Newt Minion, MD;  Location: [facility];  Service: Orthopedics;  Laterality: Left;  . AMPUTATION Left 06/11/2016   Procedure: Left 2nd Toe Amputation;  Surgeon: Newt Minion, MD;  Location: [facility];  Service: Orthopedics;  Laterality: Left;  . AMPUTATION Left 07/12/2016   Procedure: LEFT 3rd TOE AMPUTATION;  Surgeon: Newt Minion, MD;  Location: [facility];  Service: Orthopedics;  Laterality: Left;  . AMPUTATION Left 09/11/2016   Procedure: Left Transmetatarsal Amputation;  Surgeon: Newt Minion, MD;  Location: [facility];  Service: Orthopedics;  Laterality: Left;  . appendectomy    . APPENDECTOMY    . COLONOSCOPY    . HERNIA REPAIR Bilateral    Inguinal  . I&D EXTREMITY Left 10/12/2014   Procedure: IRRIGATION AND DEBRIDEMENT INDEX FINGER;  Surgeon: Iran Planas, MD;  Location: [facility];  Service: Orthopedics;  Laterality: Left;  . KYPHOPLASTY  2012 ?   T 12- L1  . LAMINECTOMY  2005 ?   lumbar- 4-5  . PROSTATE SURGERY    . ROTATOR CUFF REPAIR Right   . STUMP REVISION Left 12/11/2016   Procedure: Revision Left Transmetatarsal Amputation, Apply Wound VAC;  Surgeon: Newt Minion, MD;  Location: [facility];  Service: Orthopedics;  Laterality: Left;  . TOTAL KNEE ARTHROPLASTY Right    Social History   Occupational History  . Retired [employer]   Social History Main Topics  . Smoking status: Never Smoker  . Smokeless tobacco: Never Used  . Alcohol use 1.2 oz/week    2 Cans of beer per week     Comment: occasional beer  . Drug  use: No  . Sexual activity: Not on file     Comment: Widowed

## 2017-05-15 ENCOUNTER — Ambulatory Visit (INDEPENDENT_AMBULATORY_CARE_PROVIDER_SITE_OTHER): Payer: Medicare Other | Admitting: Pharmacist

## 2017-05-15 DIAGNOSIS — Z7901 Long term (current) use of anticoagulants: Secondary | ICD-10-CM

## 2017-05-15 DIAGNOSIS — Z5181 Encounter for therapeutic drug level monitoring: Secondary | ICD-10-CM | POA: Diagnosis not present

## 2017-05-15 DIAGNOSIS — I482 Chronic atrial fibrillation, unspecified: Secondary | ICD-10-CM

## 2017-05-15 LAB — POCT INR: INR: 2.5

## 2017-05-19 ENCOUNTER — Telehealth: Payer: Self-pay | Admitting: Neurology

## 2017-05-19 NOTE — Telephone Encounter (Signed)
I called the patient. The use of calcium and Fosamax should be okay with myasthenia gravis, no problems with the use of these medications.

## 2017-05-19 NOTE — Telephone Encounter (Signed)
Patient's daughter is calling. Dr.Gates wants patient to take Fosamax 70 mg once a week and Calcium 600mg  daily after having a bone density test.. Are these medications ok for him to take with having Myasthenia Gravis?

## 2017-05-20 ENCOUNTER — Telehealth: Payer: Self-pay | Admitting: Pharmacist

## 2017-05-20 NOTE — Telephone Encounter (Signed)
Patient to have dental procedure (day TBD). His INR > 3 today; therefore, not able to complete today.   Oral surgeon goal for procedure INR </= 3. Patient is to call coumadin clinic to schedule INR check  ASAP.   We should call dentis back to okay procedure if INR < 3  *talkd to Richard Bradley - next INR check in 2 days 10/25 at 1:15pm*

## 2017-05-22 ENCOUNTER — Ambulatory Visit (INDEPENDENT_AMBULATORY_CARE_PROVIDER_SITE_OTHER): Payer: Medicare Other | Admitting: *Deleted

## 2017-05-22 DIAGNOSIS — I482 Chronic atrial fibrillation, unspecified: Secondary | ICD-10-CM

## 2017-05-22 DIAGNOSIS — Z7901 Long term (current) use of anticoagulants: Secondary | ICD-10-CM | POA: Diagnosis not present

## 2017-05-22 DIAGNOSIS — Z5181 Encounter for therapeutic drug level monitoring: Secondary | ICD-10-CM

## 2017-05-22 LAB — POCT INR: INR: 2.3

## 2017-06-04 ENCOUNTER — Telehealth (INDEPENDENT_AMBULATORY_CARE_PROVIDER_SITE_OTHER): Payer: Self-pay | Admitting: Family

## 2017-06-04 NOTE — Telephone Encounter (Signed)
05/01/2017 OV note faxed to [facility]

## 2017-06-05 ENCOUNTER — Ambulatory Visit (INDEPENDENT_AMBULATORY_CARE_PROVIDER_SITE_OTHER): Payer: Medicare Other | Admitting: *Deleted

## 2017-06-05 DIAGNOSIS — I482 Chronic atrial fibrillation, unspecified: Secondary | ICD-10-CM

## 2017-06-05 DIAGNOSIS — Z5181 Encounter for therapeutic drug level monitoring: Secondary | ICD-10-CM | POA: Diagnosis not present

## 2017-06-05 DIAGNOSIS — Z7901 Long term (current) use of anticoagulants: Secondary | ICD-10-CM | POA: Diagnosis not present

## 2017-06-05 LAB — POCT INR: INR: 2.1

## 2017-06-27 ENCOUNTER — Ambulatory Visit (INDEPENDENT_AMBULATORY_CARE_PROVIDER_SITE_OTHER): Payer: Medicare Other | Admitting: Pharmacist

## 2017-06-27 DIAGNOSIS — I482 Chronic atrial fibrillation, unspecified: Secondary | ICD-10-CM

## 2017-06-27 DIAGNOSIS — Z5181 Encounter for therapeutic drug level monitoring: Secondary | ICD-10-CM | POA: Diagnosis not present

## 2017-06-27 DIAGNOSIS — Z7901 Long term (current) use of anticoagulants: Secondary | ICD-10-CM | POA: Diagnosis not present

## 2017-06-27 LAB — POCT INR: INR: 2.8

## 2017-06-27 NOTE — Patient Instructions (Signed)
Continue on same dose 1/2 tablet daily except 1 tablet on Mondays and Friidays.  Call if on any new medications or scheduled for any procedures [phone] [id number]. Recheck INR in 6 weeks.

## 2017-07-01 ENCOUNTER — Encounter (INDEPENDENT_AMBULATORY_CARE_PROVIDER_SITE_OTHER): Payer: Self-pay

## 2017-07-01 ENCOUNTER — Ambulatory Visit (INDEPENDENT_AMBULATORY_CARE_PROVIDER_SITE_OTHER): Payer: Medicare Other | Admitting: Orthopedic Surgery

## 2017-07-02 ENCOUNTER — Encounter (INDEPENDENT_AMBULATORY_CARE_PROVIDER_SITE_OTHER): Payer: Self-pay | Admitting: Family

## 2017-07-02 ENCOUNTER — Ambulatory Visit (INDEPENDENT_AMBULATORY_CARE_PROVIDER_SITE_OTHER): Payer: Medicare Other | Admitting: Family

## 2017-07-02 DIAGNOSIS — Z89432 Acquired absence of left foot: Secondary | ICD-10-CM

## 2017-07-02 DIAGNOSIS — T8131XD Disruption of external operation (surgical) wound, not elsewhere classified, subsequent encounter: Secondary | ICD-10-CM | POA: Diagnosis not present

## 2017-07-02 NOTE — Progress Notes (Signed)
Office Visit Note   Patient: Richard Bradley           Date of Birth: 04/08/1926           MRN: [id number] Visit Date: 07/02/2017              Requested by: Josetta Huddle, MD [address] [place], [place] [address] PCP: Josetta Huddle, MD  No chief complaint on file.     HPI: Patient returns in follow-up for wound dehiscence left transmetatarsal amputation. Patient pleased with progress. [place] aide doing wound care. Wearing compression hose daily. Also recently had modifications to ED shoes.   Assessment & Plan: Visit Diagnoses:  1. S/P transmetatarsal amputation of foot, left ([place])   2. Postoperative wound dehiscence, subsequent encounter     Plan: Continue with nitroglycerin patches for the left foot and continue with Silvadene dressing changes. Return for any concerns of infection. Follow up in 2 months.   Follow-Up Instructions: Return in about 3 months (around 09/30/2017).   Ortho Exam  Patient is alert, oriented, no adenopathy, well-dressed, normal affect, normal respiratory effort. Examination patient ambulates in a wheelchair. There is no redness no cellulitis on either lower extremity left foot wound dehiscence is healing slowly. measures 11 x 3 mm and 1 mm deep is good granulation tissue at the base no exposed bone or tendon.   Imaging: No results found. No images are attached to the encounter.  Labs: Lab Results  Component Value Date   HGBA1C 5.6 09/26/2011   ESRSEDRATE 7 05/24/2016   ESRSEDRATE 29 (H) 10/12/2014   ESRSEDRATE 8 09/26/2011   CRP 6.4 (H) 05/24/2016   CRP 14.0 (H) 10/12/2014   CRP 0.02 09/26/2011   REPTSTATUS 04/22/2017 FINAL 04/17/2017   GRAMSTAIN  10/12/2014    RARE WBC PRESENT,BOTH PMN AND MONONUCLEAR NO ORGANISMS SEEN Performed at [facility]  10/12/2014    RARE WBC PRESENT,BOTH PMN AND MONONUCLEAR NO ORGANISMS SEEN Performed at [facility]    CULT NO GROWTH 5 DAYS 04/17/2017   LABORGA  PSEUDOMONAS AERUGINOSA (A) 12/12/2016    Orders:  No orders of the defined types were placed in this encounter.  No orders of the defined types were placed in this encounter.    Procedures: No procedures performed  Clinical Data: No additional findings.  ROS:  All other systems negative, except as noted in the HPI. Review of Systems  Constitutional: Negative for chills and fever.  Cardiovascular: Negative for leg swelling.  Skin: Positive for wound.    Objective: Vital Signs: There were no vitals taken for this visit.  Specialty Comments:  No specialty comments available.  PMFS History: Patient Active Problem List   Diagnosis Date Noted  . Sepsis affecting skin ([place]) 03/07/2017  . Cellulitis of right lower extremity   . Hypotension   . S/P transmetatarsal amputation of foot, left ([place]) 02/06/2017  . Pseudomonas urinary tract infection 12/16/2016  . Altered mental status   . Sepsis ([place]) 12/12/2016  . Chronic systolic CHF (congestive heart failure) ([place]) 12/12/2016  . Wound dehiscence, surgical 10/03/2016  . Acute respiratory distress   . Goals of care, counseling/discussion   . Palliative care by specialist   . Hypokalemia 09/21/2016  . Benign essential HTN 09/21/2016  . Anticoagulated on Coumadin 12/20/2013  . Myasthenia gravis ([place]) 06/27/2011  . Chronic atrial fibrillation [facility])    Past Medical History:  Diagnosis Date  . Atrial fibrillation ([place])   .  Cellulitis    left arm  . CHF (congestive heart failure) ([place])   . Dyspnea    oxygen prn  . Dysrhythmia    afib  . GERD (gastroesophageal reflux disease)    ocassional  . History of kidney stones    passed  . Myasthenia gravis   . Osteoarthritis   . Pneumonia    Hstory of  . 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 Prostate cancer ([place]) dx'd 1982   treated with surgery and radiation.  Basal cell cancer- many  . Sepsis ([place])   . Urine incontinence   . Varicose veins of left lower  extremity     Family History  Problem Relation Age of Onset  . Heart disease Father   . Heart attack Father   . Hypertension Father   . Heart disease Brother   . Heart disease Paternal Grandfather   . Stroke Mother   . Parkinsonism Sister   . Heart disease Sister   . Dementia Sister     Past Surgical History:  Procedure Laterality Date  . AMPUTATION Left 03/20/2016   Procedure: Left Great Toe Amputation at Metatarsophalangeal Joint;  Surgeon: Newt Minion, MD;  Location: [place];  Service: Orthopedics;  Laterality: Left;  . AMPUTATION Left 06/11/2016   Procedure: Left 2nd Toe Amputation;  Surgeon: Newt Minion, MD;  Location: [place];  Service: Orthopedics;  Laterality: Left;  . AMPUTATION Left 07/12/2016   Procedure: LEFT 3rd TOE AMPUTATION;  Surgeon: Newt Minion, MD;  Location: [place];  Service: Orthopedics;  Laterality: Left;  . AMPUTATION Left 09/11/2016   Procedure: Left Transmetatarsal Amputation;  Surgeon: Newt Minion, MD;  Location: [facility];  Service: Orthopedics;  Laterality: Left;  . appendectomy    . APPENDECTOMY    . COLONOSCOPY    . HERNIA REPAIR Bilateral    Inguinal  . I&D EXTREMITY Left 10/12/2014   Procedure: IRRIGATION AND DEBRIDEMENT INDEX FINGER;  Surgeon: Iran Planas, MD;  Location: [facility];  Service: Orthopedics;  Laterality: Left;  . KYPHOPLASTY  2012 ?   T 12- L1  . LAMINECTOMY  2005 ?   lumbar- 4-5  . PROSTATE SURGERY    . ROTATOR CUFF REPAIR Right   . STUMP REVISION Left 12/11/2016   Procedure: Revision Left Transmetatarsal Amputation, Apply Wound VAC;  Surgeon: Newt Minion, MD;  Location: [facility];  Service: Orthopedics;  Laterality: Left;  . TOTAL KNEE ARTHROPLASTY Right    Social History   Occupational History  . Occupation: Retired    Fish farm manager: [place]: [employer]  Tobacco Use  . Smoking status: Never Smoker  . Smokeless tobacco: Never Used  Substance and Sexual Activity  . Alcohol use: Yes    Alcohol/week: 1.2 oz      Types: 2 Cans of beer per week    Comment: occasional beer  . Drug use: No  . Sexual activity: Not on file    Comment: Widowed

## 2017-07-03 ENCOUNTER — Ambulatory Visit (INDEPENDENT_AMBULATORY_CARE_PROVIDER_SITE_OTHER): Payer: Medicare Other | Admitting: Orthopedic Surgery

## 2017-07-25 ENCOUNTER — Telehealth (INDEPENDENT_AMBULATORY_CARE_PROVIDER_SITE_OTHER): Payer: Self-pay | Admitting: Orthopedic Surgery

## 2017-07-25 ENCOUNTER — Other Ambulatory Visit (INDEPENDENT_AMBULATORY_CARE_PROVIDER_SITE_OTHER): Payer: Self-pay | Admitting: Radiology

## 2017-07-25 MED ORDER — DOXYCYCLINE HYCLATE 50 MG PO CAPS: 100.0000 mg | ORAL_CAPSULE | Freq: Two times a day (BID) | ORAL | 0 refills | Status: DC

## 2017-07-25 NOTE — Telephone Encounter (Signed)
See below, regarding right leg, can you advise?  Daughter is PT at [facility].  She says he has history of cellulitis, and Sharol Given has prescribed abx before.  Foot is turning a little red, he has Duda's socks.  A little swollen, and still red, so she wants to get abx started, then f/u with Sharol Given next week.

## 2017-07-25 NOTE — Telephone Encounter (Signed)
Pt daughter called and would like to see if her father can get a Antibiotic for Cellulitis

## 2017-07-25 NOTE — Telephone Encounter (Signed)
yes

## 2017-08-04 ENCOUNTER — Ambulatory Visit (INDEPENDENT_AMBULATORY_CARE_PROVIDER_SITE_OTHER): Payer: Medicare Other | Admitting: Family

## 2017-08-04 ENCOUNTER — Encounter (INDEPENDENT_AMBULATORY_CARE_PROVIDER_SITE_OTHER): Payer: Self-pay | Admitting: Family

## 2017-08-04 DIAGNOSIS — L03115 Cellulitis of right lower limb: Secondary | ICD-10-CM | POA: Diagnosis not present

## 2017-08-04 DIAGNOSIS — Z5181 Encounter for therapeutic drug level monitoring: Secondary | ICD-10-CM

## 2017-08-04 DIAGNOSIS — I872 Venous insufficiency (chronic) (peripheral): Secondary | ICD-10-CM

## 2017-08-04 DIAGNOSIS — Z7901 Long term (current) use of anticoagulants: Secondary | ICD-10-CM

## 2017-08-04 NOTE — Progress Notes (Signed)
Office Visit Note   Patient: Richard Bradley           Date of Birth: 1925/12/03           MRN: [id number] Visit Date: 08/04/2017              Requested by: Josetta Huddle, MD [address] [place], [place] [address] PCP: Josetta Huddle, MD  No chief complaint on file.     HPI: The patient is an 84 year old gentleman seen today for evaluation of pain, swelling and redness to right lower extremity. Has been on Doxycycline since 12/28 for presumed cellulitis. Has an abrasion to right knee that is several weeks old. Does wear compression garments bilaterally daily. Aide reports despite abx and compression to improvement to RLE swelling.   Assessment & Plan: Visit Diagnoses:  1. Cellulitis of right lower extremity   2. Venous insufficiency of right leg   3. Anticoagulated on Coumadin     Plan: will wrap RLE with Dynaflex for next week. Re evaluate in 1 week. If resolution may need to get him in 2 layers of compression. Will continue with elevation.   Follow-Up Instructions: Return in about 1 week (around 08/11/2017).   Ortho Exam  Patient is alert, oriented, no adenopathy, well-dressed, normal affect, normal respiratory effort. RLE with pitting tender erythematous edema to lower extremity. Leg shiny and tight. No weeping. Foot with pitting edema no erythema. Abrasion to knee anteriorly, no drainage, or surrounding maceration. Does have palpable pulse R DP.   Imaging: No results found. No images are attached to the encounter.  Labs: Lab Results  Component Value Date   HGBA1C 5.6 09/26/2011   ESRSEDRATE 7 05/24/2016   ESRSEDRATE 29 (H) 10/12/2014   ESRSEDRATE 8 09/26/2011   CRP 6.4 (H) 05/24/2016   CRP 14.0 (H) 10/12/2014   CRP 0.02 09/26/2011   REPTSTATUS 04/22/2017 FINAL 04/17/2017   GRAMSTAIN  10/12/2014    RARE WBC PRESENT,BOTH PMN AND MONONUCLEAR NO ORGANISMS SEEN Performed at [facility]  10/12/2014    RARE WBC PRESENT,BOTH PMN  AND MONONUCLEAR NO ORGANISMS SEEN Performed at [facility]    CULT NO GROWTH 5 DAYS 04/17/2017   LABORGA PSEUDOMONAS AERUGINOSA (A) 12/12/2016    @LABSALLVALUES (HGBA1)@  There is no height or weight on file to calculate BMI.  Orders:  No orders of the defined types were placed in this encounter.  No orders of the defined types were placed in this encounter.    Procedures: No procedures performed  Clinical Data: No additional findings.  ROS:  All other systems negative, except as noted in the HPI. Review of Systems  Objective: Vital Signs: There were no vitals taken for this visit.  Specialty Comments:  No specialty comments available.  PMFS History: Patient Active Problem List   Diagnosis Date Noted  . Sepsis affecting skin ([place]) 03/07/2017  . Cellulitis of right lower extremity   . Hypotension   . S/P transmetatarsal amputation of foot, left ([place]) 02/06/2017  . Pseudomonas urinary tract infection 12/16/2016  . Altered mental status   . Sepsis ([place]) 12/12/2016  . Chronic systolic CHF (congestive heart failure) ([place]) 12/12/2016  . Wound dehiscence, surgical 10/03/2016  . Acute respiratory distress   . Goals of care, counseling/discussion   . Palliative care by specialist   . Hypokalemia 09/21/2016  . Benign essential HTN 09/21/2016  . Anticoagulated on Coumadin 12/20/2013  . Myasthenia gravis ([place]) 06/27/2011  .  Chronic atrial fibrillation [facility])    Past Medical History:  Diagnosis Date  . Atrial fibrillation ([place])   . Cellulitis    left arm  . CHF (congestive heart failure) ([place])   . Dyspnea    oxygen prn  . Dysrhythmia    afib  . GERD (gastroesophageal reflux disease)    ocassional  . History of kidney stones    passed  . Myasthenia gravis   . Osteoarthritis   . Pneumonia    Hstory of  . 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 Prostate cancer ([place]) dx'd 1982   treated with surgery and radiation.  Basal cell cancer-  many  . Sepsis ([place])   . Urine incontinence   . Varicose veins of left lower extremity     Family History  Problem Relation Age of Onset  . Heart disease Father   . Heart attack Father   . Hypertension Father   . Heart disease Brother   . Heart disease Paternal Grandfather   . Stroke Mother   . Parkinsonism Sister   . Heart disease Sister   . Dementia Sister     Past Surgical History:  Procedure Laterality Date  . AMPUTATION Left 03/20/2016   Procedure: Left Great Toe Amputation at Metatarsophalangeal Joint;  Surgeon: Newt Minion, MD;  Location: [place];  Service: Orthopedics;  Laterality: Left;  . AMPUTATION Left 06/11/2016   Procedure: Left 2nd Toe Amputation;  Surgeon: Newt Minion, MD;  Location: [place];  Service: Orthopedics;  Laterality: Left;  . AMPUTATION Left 07/12/2016   Procedure: LEFT 3rd TOE AMPUTATION;  Surgeon: Newt Minion, MD;  Location: [facility];  Service: Orthopedics;  Laterality: Left;  . AMPUTATION Left 09/11/2016   Procedure: Left Transmetatarsal Amputation;  Surgeon: Newt Minion, MD;  Location: [place];  Service: Orthopedics;  Laterality: Left;  . appendectomy    . APPENDECTOMY    . COLONOSCOPY    . HERNIA REPAIR Bilateral    Inguinal  . I&D EXTREMITY Left 10/12/2014   Procedure: IRRIGATION AND DEBRIDEMENT INDEX FINGER;  Surgeon: Iran Planas, MD;  Location: [place] [place];  Service: Orthopedics;  Laterality: Left;  . KYPHOPLASTY  2012 ?   T 12- L1  . LAMINECTOMY  2005 ?   lumbar- 4-5  . PROSTATE SURGERY    . ROTATOR CUFF REPAIR Right   . STUMP REVISION Left 12/11/2016   Procedure: Revision Left Transmetatarsal Amputation, Apply Wound VAC;  Surgeon: Newt Minion, MD;  Location: [place];  Service: Orthopedics;  Laterality: Left;  . TOTAL KNEE ARTHROPLASTY Right    Social History   Occupational History  . Occupation: Retired    Fish farm manager: [place]: [employer]  Tobacco Use  . Smoking status: Never Smoker  . Smokeless tobacco: Never  Used  Substance and Sexual Activity  . Alcohol use: Yes    Alcohol/week: 1.2 oz    Types: 2 Cans of beer per week    Comment: occasional beer  . Drug use: No  . Sexual activity: Not on file    Comment: Widowed

## 2017-08-05 ENCOUNTER — Ambulatory Visit (INDEPENDENT_AMBULATORY_CARE_PROVIDER_SITE_OTHER): Payer: Medicare Other | Admitting: *Deleted

## 2017-08-05 DIAGNOSIS — I482 Chronic atrial fibrillation, unspecified: Secondary | ICD-10-CM

## 2017-08-05 DIAGNOSIS — Z7901 Long term (current) use of anticoagulants: Secondary | ICD-10-CM | POA: Diagnosis not present

## 2017-08-05 DIAGNOSIS — Z5181 Encounter for therapeutic drug level monitoring: Secondary | ICD-10-CM | POA: Diagnosis not present

## 2017-08-05 LAB — POCT INR: INR: 4.2

## 2017-08-05 NOTE — Patient Instructions (Signed)
Description   Skip today's dose, on this Friday only take 1/2 tablet, then continue on same dose 1/2 tablet daily except 1 tablet on Mondays and Fridays.  Call if on any new medications or scheduled for any procedures 336- [id number]. Recheck INR in 2 weeks.

## 2017-08-11 ENCOUNTER — Ambulatory Visit (INDEPENDENT_AMBULATORY_CARE_PROVIDER_SITE_OTHER): Payer: Medicare Other | Admitting: Orthopedic Surgery

## 2017-08-11 ENCOUNTER — Ambulatory Visit (INDEPENDENT_AMBULATORY_CARE_PROVIDER_SITE_OTHER): Payer: Medicare Other | Admitting: Family

## 2017-08-13 ENCOUNTER — Ambulatory Visit: Payer: Medicare Other | Admitting: Neurology

## 2017-08-14 ENCOUNTER — Ambulatory Visit (INDEPENDENT_AMBULATORY_CARE_PROVIDER_SITE_OTHER): Payer: Medicare Other | Admitting: Family

## 2017-08-19 ENCOUNTER — Ambulatory Visit (INDEPENDENT_AMBULATORY_CARE_PROVIDER_SITE_OTHER): Payer: Medicare Other | Admitting: *Deleted

## 2017-08-19 DIAGNOSIS — Z7901 Long term (current) use of anticoagulants: Secondary | ICD-10-CM | POA: Diagnosis not present

## 2017-08-19 DIAGNOSIS — I482 Chronic atrial fibrillation, unspecified: Secondary | ICD-10-CM

## 2017-08-19 DIAGNOSIS — Z5181 Encounter for therapeutic drug level monitoring: Secondary | ICD-10-CM | POA: Diagnosis not present

## 2017-08-19 LAB — POCT INR: INR: 2.3

## 2017-08-19 NOTE — Patient Instructions (Signed)
Description   Continue on same dose 1/2 tablet daily except 1 tablet on Mondays and Fridays.  Call if on any new medications or scheduled for any procedures 336- [id number]. Recheck INR in 2 weeks.

## 2017-09-03 ENCOUNTER — Ambulatory Visit (INDEPENDENT_AMBULATORY_CARE_PROVIDER_SITE_OTHER): Payer: Medicare Other | Admitting: *Deleted

## 2017-09-03 DIAGNOSIS — Z5181 Encounter for therapeutic drug level monitoring: Secondary | ICD-10-CM

## 2017-09-03 DIAGNOSIS — I482 Chronic atrial fibrillation, unspecified: Secondary | ICD-10-CM

## 2017-09-03 DIAGNOSIS — Z7901 Long term (current) use of anticoagulants: Secondary | ICD-10-CM

## 2017-09-03 LAB — POCT INR: INR: 2.9

## 2017-09-03 NOTE — Patient Instructions (Signed)
Description   Continue on same dose 1/2 tablet daily except 1 tablet on Mondays and Fridays.  Call if on any new medications or scheduled for any procedures 336- [id number]. Recheck INR in 3 weeks.

## 2017-09-15 NOTE — Progress Notes (Signed)
[facility] Note  09/16/2017     CHIEF COMPLAINT Patient presents for Retina Evaluation   HISTORY OF PRESENT ILLNESS: Richard Bradley is a 84 y.o. male who presents to the clinic today for:   HPI    Retina Evaluation    In both eyes.  This started 12 months ago.  Duration of 1 year.  Associated Symptoms Negative for Flashes, Floaters, Weight Loss, Fever, Pain, Trauma, Redness, Scalp Tenderness, Distortion, Photophobia, Jaw Claudication, Fatigue, Blind Spot, Glare and Shoulder/Hip pain.  Context:  distance vision, mid-range vision, near vision, reading and watching TV.  Treatments tried include no treatments.  Response to treatment was no improvement.  I, the attending physician,  performed the HPI with the patient and updated documentation appropriately.          Comments    Retinal eval per Dr. Clent Jacks. Pt c/o decrease in vision, especially OD for the past year. Difficulty with reading, TV. Pt doesn't drive. Vision blurred somewhat in the left eye. Vision in the right eye is "almost gone."       Last edited by Bernarda Caffey, MD on 09/16/2017  3:45 PM. (History)    Pt states PCP referred him to Dr. Elliot Dally for blurred VA; Pt states Dr. Sabra Heck gave him specs but states they did not help OU VA; Pt states he was seen in [place] by an eye doctor and was never told he had any issue; Pt denies dx of DM, denies dx of HTN, denies being on HTN medication; Pt endorses that he is taking blood thinner due to having heart cath;   Referring physician: Clent Jacks, MD [address] [place], [place] [address]  HISTORICAL INFORMATION:   Selected notes from the MEDICAL RECORD NUMBER Referred by Dr. Elliot Dally for AMD/CME OD;  LEE- 02.13.19 [BCVA OD: 20/70 OS: 20/20] Ocular Hx- pseudophakia OU (1989), POAG OU PMH- myasthenia gravis, hx prostate ca, CHF, A-Fib, COPD    CURRENT MEDICATIONS: Current Outpatient Medications (Ophthalmic Drugs)  Medication Sig   . ketorolac (ACULAR) 0.5 % ophthalmic solution Place 1 drop into the right eye 4 (four) times daily.  . prednisoLONE acetate (PRED FORTE) 1 % ophthalmic suspension Place 1 drop into the right eye 4 (four) times daily.   No current facility-administered medications for this visit.  (Ophthalmic Drugs)   Current Outpatient Medications (Other)  Medication Sig  . Cholecalciferol (VITAMIN D) 2000 units CAPS Take 2,000 Units by mouth daily.  Marland Kitchen escitalopram (LEXAPRO) 5 MG tablet Take 5 mg by mouth daily.   Marland Kitchen FIBER PO Take 1 tablet by mouth 3 (three) times daily.   . fluticasone (FLONASE) 50 MCG/ACT nasal spray Place 2 sprays into both nostrils 2 (two) times daily.   Marland Kitchen guaiFENesin (MUCINEX) 600 MG 12 hr tablet Take 600 mg by mouth at bedtime as needed for to loosen phlegm.   Marland Kitchen HYDROcodone-acetaminophen (NORCO) 10-325 MG tablet Take 1 tablet by mouth every 6 (six) hours as needed (for pain).  . metoprolol tartrate (LOPRESSOR) 25 MG tablet Take 25 mg by mouth 2 (two) times daily.  . pantoprazole (PROTONIX) 40 MG tablet Take 1 tablet (40 mg total) by mouth daily.  . polyethylene glycol (MIRALAX / GLYCOLAX) packet Take 17 g by mouth daily as needed for moderate constipation. For constipation (Patient taking differently: Take 17 g by mouth daily as needed for mild constipation or moderate constipation. )  . potassium chloride SA (K-DUR,KLOR-CON)  20 MEQ tablet Take 1 tablet (20 mEq total) by mouth daily. (Patient taking differently: Take 20 mEq by mouth daily with lunch. )  . predniSONE (DELTASONE) 10 MG tablet Take 2 tablets (20 mg total) by mouth daily with breakfast.  . vitamin B-12 (CYANOCOBALAMIN) 1000 MCG tablet Take 1,000 mcg by mouth daily with lunch.   . warfarin (COUMADIN) 5 MG tablet TAKE AS DIRECTED BY COUMADIN CLINIC (Patient taking differently: 5 mg on Monday and Friday//// 2.5 Tues. Wed. Thurs. Sat and sunday takes in evening)  . doxycycline (VIBRAMYCIN) 50 MG capsule Take 2 capsules (100 mg  total) by mouth 2 (two) times daily. (Patient not taking: Reported on 09/16/2017)  . furosemide (LASIX) 40 MG tablet Take 1 tablet (40 mg total) by mouth daily. (Patient taking differently: Take 20-40 mg by mouth See admin instructions. Takes 40 mg in morning at 20 mg at lunch)  . nitroGLYCERIN (NITRODUR - DOSED IN MG/24 HR) 0.2 mg/hr patch Apply one (1) patch to the left foot every other day.   No current facility-administered medications for this visit.  (Other)      REVIEW OF SYSTEMS: ROS    Positive for: Skin, Cardiovascular, Eyes, Respiratory, Psychiatric   Negative for: Constitutional, Gastrointestinal, Neurological, Genitourinary, Musculoskeletal, HENT, Endocrine, Allergic/Imm, Heme/Lymph   Last edited by Elmore Guise on 09/16/2017  1:27 PM. (History)       ALLERGIES Allergies  Allergen Reactions  . Tape Other (See Comments)    PATIENT'S SKIN IS VERY THIN AND WILL TEAR AND BRUISE VERY EASILY!! Paper take is ok    PAST MEDICAL HISTORY Past Medical History:  Diagnosis Date  . Atrial fibrillation ([place])   . Cellulitis    left arm  . CHF (congestive heart failure) ([place])   . Dyspnea    oxygen prn  . Dysrhythmia    afib  . GERD (gastroesophageal reflux disease)    ocassional  . History of kidney stones    passed  . Myasthenia gravis   . Osteoarthritis   . Pneumonia    Hstory of  . 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 Prostate cancer ([place]) dx'd 1982   treated with surgery and radiation.  Basal cell cancer- many  . Sepsis ([place])   . Urine incontinence   . Varicose veins of left lower extremity    Past Surgical History:  Procedure Laterality Date  . AMPUTATION Left 03/20/2016   Procedure: Left Great Toe Amputation at Metatarsophalangeal Joint;  Surgeon: Newt Minion, MD;  Location: [place];  Service: Orthopedics;  Laterality: Left;  . AMPUTATION Left 06/11/2016   Procedure: Left 2nd Toe Amputation;  Surgeon: Newt Minion, MD;  Location: [place];   Service: Orthopedics;  Laterality: Left;  . AMPUTATION Left 07/12/2016   Procedure: LEFT 3rd TOE AMPUTATION;  Surgeon: Newt Minion, MD;  Location: [place];  Service: Orthopedics;  Laterality: Left;  . AMPUTATION Left 09/11/2016   Procedure: Left Transmetatarsal Amputation;  Surgeon: Newt Minion, MD;  Location: [facility];  Service: Orthopedics;  Laterality: Left;  . appendectomy    . APPENDECTOMY    . COLONOSCOPY    . HERNIA REPAIR Bilateral    Inguinal  . I&D EXTREMITY Left 10/12/2014   Procedure: IRRIGATION AND DEBRIDEMENT INDEX FINGER;  Surgeon: Iran Planas, MD;  Location: [place];  Service: Orthopedics;  Laterality: Left;  . KYPHOPLASTY  2012 ?   T 12- L1  . LAMINECTOMY  2005 ?   lumbar- 4-5  .  PROSTATE SURGERY    . ROTATOR CUFF REPAIR Right   . STUMP REVISION Left 12/11/2016   Procedure: Revision Left Transmetatarsal Amputation, Apply Wound VAC;  Surgeon: Newt Minion, MD;  Location: [place];  Service: Orthopedics;  Laterality: Left;  . TOTAL KNEE ARTHROPLASTY Right     FAMILY HISTORY Family History  Problem Relation Age of Onset  . Heart disease Father   . Heart attack Father   . Hypertension Father   . Heart disease Brother   . Heart disease Paternal Grandfather   . Stroke Mother   . Parkinsonism Sister   . Heart disease Sister   . Dementia Sister     SOCIAL HISTORY Social History   Tobacco Use  . Smoking status: Never Smoker  . Smokeless tobacco: Never Used  Substance Use Topics  . Alcohol use: Yes    Alcohol/week: 1.2 oz    Types: 2 Cans of beer per week    Comment: occasional beer  . Drug use: No         OPHTHALMIC EXAM:  Base Eye Exam    Visual Acuity (Snellen - Linear)      Right Left   Dist cc 20/80-1 20/100-1   Dist ph cc NI NI   Correction:  Glasses       Tonometry (Tonopen, 1:40 PM)      Right Left   Pressure 16 15       Pupils      Dark Light Shape React APD   Right 4 3 Round Minimal +2   Left 4 3 Round Brisk None       Visual  Fields (Counting fingers)      Left Right    Full    Restrictions  Total superior temporal, inferior temporal deficiencies       Extraocular Movement      Right Left    Full, Ortho Full, Ortho       Neuro/Psych    Oriented x3:  Yes   Mood/Affect:  Normal       Dilation    Both eyes:  1.0% Mydriacyl, 2.5% Phenylephrine @ 1:40 PM        Slit Lamp and Fundus Exam    Slit Lamp Exam      Right Left   Lids/Lashes Ptosis UL, Meibomian gland dysfunction, Telangiectasia Ptosis UL, Meibomian gland dysfunction, Telangiectasia   Conjunctiva/Sclera White and quiet White and quiet   Cornea Arcus Arcus   Anterior Chamber Deep and quiet Deep and quiet   [place] Round and dilated Round and dilated   Lens Three piece PC IOL in good position Three piece PC IOL in good position   Vitreous Vitreous syneresis, Posterior vitreous detachment Vitreous syneresis, Posterior vitreous detachment       Fundus Exam      Right Left   Disc 2+ Pallor, Hemorrhage at 1200, inf rim thinning Normal, pink rim   C/D Ratio 0.85 0.5   Macula Blunted foveal reflex, Microaneurysms/IRH, Cystic changes, multiple blot hemorrhages nasally Blunted foveal reflex, Retinal pigment epithelial mottling, No heme or edema   Vessels Vascular attenuation Vascular attenuation   Periphery Attached, scattered 360 DBH Attached        Refraction    Wearing Rx      Sphere Cylinder Axis Add   Right -1.00 +1.25 026 +2.75   Left -1.00 +2.00 156 +2.75   Age:  64 M   Type:  PAL       Manifest  Refraction      Sphere Cylinder Axis Dist VA   Right -2.00 +1.75 174 20/80   Left -2.00 +1.00 180 20/40          IMAGING AND PROCEDURES  Imaging and Procedures for 09/16/17  OCT, Retina - OU - Both Eyes     Right Eye Quality was poor. Central Foveal Thickness: 341. Progression has no prior data. Findings include abnormal foveal contour, intraretinal fluid, no SRF.   Left Eye Quality was poor. Central Foveal Thickness: 269.  Progression has no prior data. Findings include normal foveal contour, no IRF, no SRF.   Notes *Images captured and stored on drive  Diagnosis / Impression:  OD: mild CME/IRF OS: NFP, No IRF/SRF  Clinical management:  See below  Abbreviations: NFP - Normal foveal profile. CME - cystoid macular edema. PED - pigment epithelial detachment. IRF - intraretinal fluid. SRF - subretinal fluid. EZ - ellipsoid zone. ERM - epiretinal membrane. ORA - outer retinal atrophy. ORT - outer retinal tubulation. SRHM - subretinal hyper-reflective material         Fluorescein Angiography Optos (Transit OD)     Right Eye Early phase findings include microaneurysm, delayed filling. Mid/Late phase findings include microaneurysm, leakage (Scattered Mas along inf hemisphere).   Left Eye Early phase findings include normal observations. Mid/Late phase findings include normal observations.   Notes Impression:  OD: mildly delayed filling time; altitudinal Mas inf hemisphere; late parafoveal leakage -- mild OS: normal study                ASSESSMENT/PLAN:    ICD-10-CM   1. CME (cystoid macular edema), right H35.351 Fluorescein Angiography Optos (Transit OD)  2. Retinal edema H35.81 OCT, Retina - OU - Both Eyes    Fluorescein Angiography Optos (Transit OD)  3. Hypertensive retinopathy of both eyes H35.033 Fluorescein Angiography Optos (Transit OD)  4. 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 Posterior vitreous detachment of both eyes H43.813   6. Pseudophakia of both eyes Z96.1     1,2. CME OD - unclear etiology - FA shows slightly delayed filling time OD; late leakage corresponding to CME, and inferior hemispheric MAs - suspect mild/remote HRVO vs idiopathic CME - discussed findings, prognosis and potential treatment options - recommend starting PF and ketorolac QID OD - if CME worsens, will consider anti-VEGF therapy - Vision and visual potential OD complicated by #4 below  3. Hypertensive  retinopathy OU - discussed importance of tight BP control - monitor  4. Optic neuropathy OD - significant cupping and pallor of disc OD - +rAPD OD - suspect glaucomatous damage vs ischemic optic neuropathy - MRI recommended by Dr. Katy Fitch, but pt has not yet scheduled - monitor  5. PVD / vitreous syneresis OU  Discussed findings and prognosis  No RT or RD on 360 peripheral exam  Reviewed s/s of RT/RD  Strict return precautions for any such RT/RD signs/symptoms  6. Pseudophakia OU  - s/p CE/IOL  - beautiful surgery, doing well  - monitor    Ophthalmic Meds Ordered this visit:  Meds ordered this encounter  Medications  . prednisoLONE acetate (PRED FORTE) 1 % ophthalmic suspension    Sig: Place 1 drop into the right eye 4 (four) times daily.    Dispense:  10 mL    Refill:  0  . ketorolac (ACULAR) 0.5 % ophthalmic solution    Sig: Place 1 drop into the right eye 4 (four) times daily.    Dispense:  10  mL    Refill:  2       Return in about 3 weeks (around 10/07/2017) for Dilated Exam, OCT.  There are no Patient Instructions on file for this visit.   Explained the diagnoses, plan, and follow up with the patient and they expressed understanding.  Patient expressed understanding of the importance of proper follow up care.   This document serves as a record of services personally performed by Gardiner Sleeper, MD, PhD. It was created on their behalf by Catha Brow, [place], a certified ophthalmic assistant. The creation of this record is the provider's dictation and/or activities during the visit.  Electronically signed by: Catha Brow, [place]  09/16/17 5:07 PM    Gardiner Sleeper, M.D., Ph.D. Diseases & Surgery of the Retina and Vitreous [facility] 09/16/17   I have reviewed the above documentation for accuracy and completeness, and I agree with the above. Gardiner Sleeper, M.D., Ph.D. 09/16/17 5:07 PM     Abbreviations: M myopia  (nearsighted); A astigmatism; H hyperopia (farsighted); P presbyopia; Mrx spectacle prescription;  CTL contact lenses; OD right eye; OS left eye; OU both eyes  XT exotropia; ET esotropia; PEK punctate epithelial keratitis; PEE punctate epithelial erosions; DES dry eye syndrome; MGD meibomian gland dysfunction; ATs artificial tears; PFAT's preservative free artificial tears; [place] nuclear sclerotic cataract; PSC posterior subcapsular cataract; ERM epi-retinal membrane; PVD posterior vitreous detachment; RD retinal detachment; DM diabetes mellitus; DR diabetic retinopathy; NPDR non-proliferative diabetic retinopathy; PDR proliferative diabetic retinopathy; CSME clinically significant macular edema; DME diabetic macular edema; dbh dot blot hemorrhages; CWS cotton wool spot; POAG primary open angle glaucoma; C/D cup-to-disc ratio; HVF humphrey visual field; GVF goldmann visual field; OCT optical coherence tomography; IOP intraocular pressure; BRVO Branch retinal vein occlusion; CRVO central retinal vein occlusion; CRAO central retinal artery occlusion; BRAO branch retinal artery occlusion; RT retinal tear; SB scleral buckle; PPV pars plana vitrectomy; VH Vitreous hemorrhage; PRP panretinal laser photocoagulation; IVK intravitreal kenalog; VMT vitreomacular traction; MH Macular hole;  NVD neovascularization of the disc; NVE neovascularization elsewhere; AREDS age related eye disease study; ARMD age related macular degeneration; POAG primary open angle glaucoma; EBMD epithelial/anterior basement membrane dystrophy; ACIOL anterior chamber intraocular lens; IOL intraocular lens; PCIOL posterior chamber intraocular lens; Phaco/IOL phacoemulsification with intraocular lens placement; [place] photorefractive keratectomy; LASIK laser assisted in situ keratomileusis; HTN hypertension; DM diabetes mellitus; COPD chronic obstructive pulmonary disease

## 2017-09-16 ENCOUNTER — Ambulatory Visit (INDEPENDENT_AMBULATORY_CARE_PROVIDER_SITE_OTHER): Payer: Medicare Other | Admitting: Ophthalmology

## 2017-09-16 ENCOUNTER — Encounter (INDEPENDENT_AMBULATORY_CARE_PROVIDER_SITE_OTHER): Payer: Self-pay | Admitting: Ophthalmology

## 2017-09-16 DIAGNOSIS — H35033 Hypertensive retinopathy, bilateral: Secondary | ICD-10-CM

## 2017-09-16 DIAGNOSIS — H469 Unspecified optic neuritis: Secondary | ICD-10-CM

## 2017-09-16 DIAGNOSIS — H3581 Retinal edema: Secondary | ICD-10-CM | POA: Diagnosis not present

## 2017-09-16 DIAGNOSIS — Z961 Presence of intraocular lens: Secondary | ICD-10-CM | POA: Diagnosis not present

## 2017-09-16 DIAGNOSIS — H43813 Vitreous degeneration, bilateral: Secondary | ICD-10-CM | POA: Diagnosis not present

## 2017-09-16 DIAGNOSIS — H35351 Cystoid macular degeneration, right eye: Secondary | ICD-10-CM | POA: Diagnosis not present

## 2017-09-16 MED ORDER — KETOROLAC TROMETHAMINE 0.5 % OP SOLN: 1.0000 [drp] | Freq: Four times a day (QID) | OPHTHALMIC | 2 refills | Status: AC

## 2017-09-16 MED ORDER — PREDNISOLONE ACETATE 1 % OP SUSP: 1.0000 [drp] | Freq: Four times a day (QID) | OPHTHALMIC | 0 refills | Status: AC

## 2017-09-21 ENCOUNTER — Other Ambulatory Visit: Payer: Self-pay | Admitting: Interventional Cardiology

## 2017-09-22 ENCOUNTER — Telehealth: Payer: Self-pay | Admitting: *Deleted

## 2017-09-22 NOTE — Telephone Encounter (Signed)
Pt's daughter called stating pt went to PCP and was ordered Zpak and will start this today .Instructed to take coumadin as ordered and Zpak as ordered do extra serving of greens between now and Thursday and to keep scheduled appt in coumadin clinic on Thursday and she states understanding

## 2017-09-25 ENCOUNTER — Ambulatory Visit (INDEPENDENT_AMBULATORY_CARE_PROVIDER_SITE_OTHER): Payer: Medicare Other | Admitting: *Deleted

## 2017-09-25 DIAGNOSIS — I482 Chronic atrial fibrillation, unspecified: Secondary | ICD-10-CM

## 2017-09-25 DIAGNOSIS — Z7901 Long term (current) use of anticoagulants: Secondary | ICD-10-CM

## 2017-09-25 DIAGNOSIS — Z5181 Encounter for therapeutic drug level monitoring: Secondary | ICD-10-CM | POA: Diagnosis not present

## 2017-09-25 LAB — POCT INR: INR: 2.7

## 2017-09-25 NOTE — Patient Instructions (Signed)
Description   Continue on same dose 1/2 tablet daily except 1 tablet on Mondays and Fridays.  Call if on any new medications or scheduled for any procedures [phone] [id number]. Recheck INR in 4 weeks.

## 2017-09-30 ENCOUNTER — Encounter (INDEPENDENT_AMBULATORY_CARE_PROVIDER_SITE_OTHER): Payer: Self-pay | Admitting: Orthopedic Surgery

## 2017-09-30 ENCOUNTER — Ambulatory Visit (INDEPENDENT_AMBULATORY_CARE_PROVIDER_SITE_OTHER): Payer: Medicare Other | Admitting: Orthopedic Surgery

## 2017-09-30 DIAGNOSIS — B351 Tinea unguium: Secondary | ICD-10-CM

## 2017-09-30 DIAGNOSIS — I872 Venous insufficiency (chronic) (peripheral): Secondary | ICD-10-CM

## 2017-09-30 DIAGNOSIS — Z89432 Acquired absence of left foot: Secondary | ICD-10-CM

## 2017-09-30 NOTE — Progress Notes (Signed)
Office Visit Note   Patient: Richard Bradley           Date of Birth: 08-Oct-1925           MRN: [id number] Visit Date: 09/30/2017              Requested by: Josetta Huddle, MD [address] [place], [place] [address] PCP: Josetta Huddle, MD  Chief Complaint  Patient presents with  . Left Foot - Follow-up      HPI: Patient is a 85 year old gentleman who presents for evaluation for both lower extremities status post transmetatarsal amputation the left venous stasis swelling in the right with onychomycotic nails on the right.  Patient complains of pain from his nails.  Assessment & Plan: Visit Diagnoses:  1. S/P transmetatarsal amputation of foot, left (HCC)   2. Venous insufficiency of right leg   3. Onychomycosis     Plan: Nails were trimmed x5.  She has good orthotics in the left.  Recommend following up as needed.  Follow-Up Instructions: Return if symptoms worsen or fail to improve.   Ortho Exam  Patient is alert, oriented, no adenopathy, well-dressed, normal affect, normal respiratory effort. Examination patient has thickened discolored onychomycotic nails on the right he is unable to safely trim the nails on his own the nails were trimmed x5 without complications.  Patient has no plantar ulcers on the right foot does have some venous stasis swelling in the right leg but no open ulcers.  Left leg is thin and atrophic.  The transmetatarsal amputation is healed well he has a very small area that is 5 x 1 mm with granulation tissue that is not completely healed but there is no cellulitis no drainage the wound is 0.1 mm deep no signs of infection.  Imaging: No results found. No images are attached to the encounter.  Labs: Lab Results  Component Value Date   HGBA1C 5.6 09/26/2011   ESRSEDRATE 7 05/24/2016   ESRSEDRATE 29 (H) 10/12/2014   ESRSEDRATE 8 09/26/2011   CRP 6.4 (H) 05/24/2016   CRP 14.0 (H) 10/12/2014   CRP 0.02 09/26/2011   REPTSTATUS 04/22/2017  FINAL 04/17/2017   GRAMSTAIN  10/12/2014    RARE WBC PRESENT,BOTH PMN AND MONONUCLEAR NO ORGANISMS SEEN Performed at [facility]  10/12/2014    RARE WBC PRESENT,BOTH PMN AND MONONUCLEAR NO ORGANISMS SEEN Performed at [facility]    CULT NO GROWTH 5 DAYS 04/17/2017   LABORGA PSEUDOMONAS AERUGINOSA (A) 12/12/2016    @LABSALLVALUES (HGBA1)@  There is no height or weight on file to calculate BMI.  Orders:  No orders of the defined types were placed in this encounter.  No orders of the defined types were placed in this encounter.    Procedures: No procedures performed  Clinical Data: No additional findings.  ROS:  All other systems negative, except as noted in the HPI. Review of Systems  Objective: Vital Signs: There were no vitals taken for this visit.  Specialty Comments:  No specialty comments available.  PMFS History: Patient Active Problem List   Diagnosis Date Noted  . Sepsis affecting skin ([place]) 03/07/2017  . Cellulitis of right lower extremity   . Hypotension   . S/P transmetatarsal amputation of foot, left ([place]) 02/06/2017  . Pseudomonas urinary tract infection 12/16/2016  . Altered mental status   . Sepsis ([place]) 12/12/2016  . Chronic systolic CHF (congestive heart failure) ([place]) 12/12/2016  . Wound dehiscence, surgical  10/03/2016  . Acute respiratory distress   . Goals of care, counseling/discussion   . Palliative care by specialist   . Hypokalemia 09/21/2016  . Benign essential HTN 09/21/2016  . Anticoagulated on Coumadin 12/20/2013  . Myasthenia gravis ([place]) 06/27/2011  . Chronic atrial fibrillation [facility])    Past Medical History:  Diagnosis Date  . Atrial fibrillation ([place])   . Cellulitis    left arm  . CHF (congestive heart failure) ([place])   . Dyspnea    oxygen prn  . Dysrhythmia    afib  . GERD (gastroesophageal reflux disease)    ocassional  . History of kidney stones    passed  . Myasthenia gravis     . Osteoarthritis   . Pneumonia    Hstory of  . 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 Prostate cancer ([place]) dx'd 1982   treated with surgery and radiation.  Basal cell cancer- many  . Sepsis ([place])   . Urine incontinence   . Varicose veins of left lower extremity     Family History  Problem Relation Age of Onset  . Heart disease Father   . Heart attack Father   . Hypertension Father   . Heart disease Brother   . Heart disease Paternal Grandfather   . Stroke Mother   . Parkinsonism Sister   . Heart disease Sister   . Dementia Sister     Past Surgical History:  Procedure Laterality Date  . AMPUTATION Left 03/20/2016   Procedure: Left Great Toe Amputation at Metatarsophalangeal Joint;  Surgeon: Newt Minion, MD;  Location: [facility];  Service: Orthopedics;  Laterality: Left;  . AMPUTATION Left 06/11/2016   Procedure: Left 2nd Toe Amputation;  Surgeon: Newt Minion, MD;  Location: [facility];  Service: Orthopedics;  Laterality: Left;  . AMPUTATION Left 07/12/2016   Procedure: LEFT 3rd TOE AMPUTATION;  Surgeon: Newt Minion, MD;  Location: [facility];  Service: Orthopedics;  Laterality: Left;  . AMPUTATION Left 09/11/2016   Procedure: Left Transmetatarsal Amputation;  Surgeon: Newt Minion, MD;  Location: [facility];  Service: Orthopedics;  Laterality: Left;  . appendectomy    . APPENDECTOMY    . COLONOSCOPY    . HERNIA REPAIR Bilateral    Inguinal  . I&D EXTREMITY Left 10/12/2014   Procedure: IRRIGATION AND DEBRIDEMENT INDEX FINGER;  Surgeon: Iran Planas, MD;  Location: [facility];  Service: Orthopedics;  Laterality: Left;  . KYPHOPLASTY  2012 ?   T 12- L1  . LAMINECTOMY  2005 ?   lumbar- 4-5  . PROSTATE SURGERY    . ROTATOR CUFF REPAIR Right   . STUMP REVISION Left 12/11/2016   Procedure: Revision Left Transmetatarsal Amputation, Apply Wound VAC;  Surgeon: Newt Minion, MD;  Location: [place] [facility];  Service: Orthopedics;  Laterality: Left;  . TOTAL KNEE ARTHROPLASTY Right    Social  History   Occupational History  . Occupation: Retired    Fish farm manager: [place]: [employer]  Tobacco Use  . Smoking status: Never Smoker  . Smokeless tobacco: Never Used  Substance and Sexual Activity  . Alcohol use: Yes    Alcohol/week: 1.2 oz    Types: 2 Cans of beer per week    Comment: occasional beer  . Drug use: No  . Sexual activity: Not on file    Comment: Widowed

## 2017-10-03 NOTE — Progress Notes (Signed)
[facility] Note  10/06/2017     CHIEF COMPLAINT Patient presents for Retina Follow Up   HISTORY OF PRESENT ILLNESS: Richard Bradley is a 84 y.o. male who presents to the clinic today for:   HPI    Retina Follow Up    Patient presents with  Other.  In right eye.  This started 2 months ago.  Severity is mild.  Since onset it is stable.  I, the attending physician,  performed the HPI with the patient and updated documentation appropriately.          Comments    F/U CME OD. Patient states vision continues to be "foggy", sometimes he can read and or times "everything is foggy". Denies wavy vision, flashes and ocular pain. Pt is using Alphagan OU BiD, Ketorlac OD QID.  Not taking PF QID OD as prescribed -- did not get drop from pharmacy       Last edited by Bernarda Caffey, MD on 10/06/2017 11:39 PM. (History)    Pt states he "feels good"; Pt states he feels like OU VA is doing well;   Referring physician: Josetta Huddle, MD [place] [place], [place] [address]  HISTORICAL INFORMATION:   Selected notes from the MEDICAL RECORD NUMBER Referred by Dr. Elliot Dally for AMD/CME OD;  LEE- 02.13.19 [BCVA OD: 20/70 OS: 20/20] Ocular Hx- pseudophakia OU (1989), POAG OU PMH- myasthenia gravis, hx prostate ca, CHF, A-Fib, COPD    CURRENT MEDICATIONS: Current Outpatient Medications (Ophthalmic Drugs)  Medication Sig  . ALPHAGAN P 0.1 % SOLN INT 1 GTT IN OU BID  . ketorolac (ACULAR) 0.5 % ophthalmic solution Place 1 drop into the right eye 4 (four) times daily.  . prednisoLONE acetate (PRED FORTE) 1 % ophthalmic suspension Place 1 drop into the right eye 4 (four) times daily.   No current facility-administered medications for this visit.  (Ophthalmic Drugs)   Current Outpatient Medications (Other)  Medication Sig  . azithromycin (ZITHROMAX) 250 MG tablet TK 2 TS PO ONE DAY ONE THEN TK 1 T PO ON DAYS 2-7  . Cholecalciferol (VITAMIN D) 2000 units  CAPS Take 2,000 Units by mouth daily.  Marland Kitchen doxycycline (VIBRAMYCIN) 50 MG capsule Take 2 capsules (100 mg total) by mouth 2 (two) times daily.  Marland Kitchen escitalopram (LEXAPRO) 5 MG tablet Take 5 mg by mouth daily.   Marland Kitchen FIBER PO Take 1 tablet by mouth 3 (three) times daily.   . fluticasone (FLONASE) 50 MCG/ACT nasal spray Place 2 sprays into both nostrils 2 (two) times daily.   Marland Kitchen guaiFENesin (MUCINEX) 600 MG 12 hr tablet Take 600 mg by mouth at bedtime as needed for to loosen phlegm.   Marland Kitchen HYDROcodone-acetaminophen (NORCO) 10-325 MG tablet Take 1 tablet by mouth every 6 (six) hours as needed (for pain).  . metoprolol tartrate (LOPRESSOR) 25 MG tablet Take 25 mg by mouth 2 (two) times daily.  . nitroGLYCERIN (NITRODUR - DOSED IN MG/24 HR) 0.2 mg/hr patch Apply one (1) patch to the left foot every other day.  . pantoprazole (PROTONIX) 40 MG tablet Take 1 tablet (40 mg total) by mouth daily.  . polyethylene glycol (MIRALAX / GLYCOLAX) packet Take 17 g by mouth daily as needed for moderate constipation. For constipation (Patient taking differently: Take 17 g by mouth daily as needed for mild constipation or moderate constipation. )  . potassium chloride SA (K-DUR,KLOR-CON) 20 MEQ tablet Take 1 tablet (20 mEq total) by  mouth daily. (Patient taking differently: Take 20 mEq by mouth daily with lunch. )  . predniSONE (DELTASONE) 10 MG tablet Take 2 tablets (20 mg total) by mouth daily with breakfast.  . vitamin B-12 (CYANOCOBALAMIN) 1000 MCG tablet Take 1,000 mcg by mouth daily with lunch.   . warfarin (COUMADIN) 5 MG tablet TAKE AS DIRECTED BY COUMADIN CLINIC  . furosemide (LASIX) 40 MG tablet Take 1 tablet (40 mg total) by mouth daily. (Patient taking differently: Take 20-40 mg by mouth See admin instructions. Takes 40 mg in morning at 20 mg at lunch)   Current Facility-Administered Medications (Other)  Medication Route  . Bevacizumab (AVASTIN) SOLN 1.25 mg Intravitreal      REVIEW OF SYSTEMS: ROS    Positive  for: Neurological, Genitourinary, Musculoskeletal, Cardiovascular, Eyes, Respiratory   Negative for: Constitutional, Gastrointestinal, Skin, HENT, Endocrine, Psychiatric, Allergic/Imm, Heme/Lymph   Last edited by Zenovia Jordan, LPN on 1/88/4166  0:63 PM. (History)       ALLERGIES Allergies  Allergen Reactions  . Tape Other (See Comments)    PATIENT'S SKIN IS VERY THIN AND WILL TEAR AND BRUISE VERY EASILY!! Paper take is ok    PAST MEDICAL HISTORY Past Medical History:  Diagnosis Date  . Atrial fibrillation ([place])   . Cellulitis    left arm  . CHF (congestive heart failure) ([place])   . Dyspnea    oxygen prn  . Dysrhythmia    afib  . GERD (gastroesophageal reflux disease)    ocassional  . History of kidney stones    passed  . Myasthenia gravis   . Osteoarthritis   . Pneumonia    Hstory of  . 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 Prostate cancer ([place]) dx'd 1982   treated with surgery and radiation.  Basal cell cancer- many  . Sepsis ([place])   . Urine incontinence   . Varicose veins of left lower extremity    Past Surgical History:  Procedure Laterality Date  . AMPUTATION Left 03/20/2016   Procedure: Left Great Toe Amputation at Metatarsophalangeal Joint;  Surgeon: Newt Minion, MD;  Location: [facility];  Service: Orthopedics;  Laterality: Left;  . AMPUTATION Left 06/11/2016   Procedure: Left 2nd Toe Amputation;  Surgeon: Newt Minion, MD;  Location: [facility];  Service: Orthopedics;  Laterality: Left;  . AMPUTATION Left 07/12/2016   Procedure: LEFT 3rd TOE AMPUTATION;  Surgeon: Newt Minion, MD;  Location: [facility];  Service: Orthopedics;  Laterality: Left;  . AMPUTATION Left 09/11/2016   Procedure: Left Transmetatarsal Amputation;  Surgeon: Newt Minion, MD;  Location: [facility];  Service: Orthopedics;  Laterality: Left;  . appendectomy    . APPENDECTOMY    . COLONOSCOPY    . HERNIA REPAIR Bilateral    Inguinal  . I&D EXTREMITY Left 10/12/2014   Procedure:  IRRIGATION AND DEBRIDEMENT INDEX FINGER;  Surgeon: Iran Planas, MD;  Location: [facility];  Service: Orthopedics;  Laterality: Left;  . KYPHOPLASTY  2012 ?   T 12- L1  . LAMINECTOMY  2005 ?   lumbar- 4-5  . PROSTATE SURGERY    . ROTATOR CUFF REPAIR Right   . STUMP REVISION Left 12/11/2016   Procedure: Revision Left Transmetatarsal Amputation, Apply Wound VAC;  Surgeon: Newt Minion, MD;  Location: [facility];  Service: Orthopedics;  Laterality: Left;  . TOTAL KNEE ARTHROPLASTY Right     FAMILY HISTORY Family History  Problem Relation Age of Onset  . Heart disease Father   .  Heart attack Father   . Hypertension Father   . Heart disease Brother   . Heart disease Paternal Grandfather   . Stroke Mother   . Parkinsonism Sister   . Heart disease Sister   . Dementia Sister     SOCIAL HISTORY Social History   Tobacco Use  . Smoking status: Never Smoker  . Smokeless tobacco: Never Used  Substance Use Topics  . Alcohol use: Yes    Alcohol/week: 1.2 oz    Types: 2 Cans of beer per week    Comment: occasional beer  . Drug use: No         OPHTHALMIC EXAM:  Base Eye Exam    Visual Acuity (Snellen - Linear)      Right Left   Dist [place] 20/150 -1 20/70 -2   Dist ph [place] 20/100 -2 20/30 -2       Tonometry (Tonopen, 1:35 PM)      Right Left   Pressure 13 10       Pupils      Dark Light Shape React APD   Right 4 3 Round Minimal +2   Left 4 3 Round Brisk None       Visual Fields (Counting fingers)      Left Right    Full    Restrictions  Total superior temporal, inferior temporal deficiencies       Extraocular Movement      Right Left    Full, Ortho Full, Ortho       Neuro/Psych    Oriented x3:  Yes   Mood/Affect:  Normal       Dilation    Both eyes:  1.0% Mydriacyl, 2.5% Phenylephrine @ 1:35 PM        Slit Lamp and Fundus Exam    Slit Lamp Exam      Right Left   Lids/Lashes Ptosis UL, Meibomian gland dysfunction, Telangiectasia Ptosis UL, Meibomian gland  dysfunction, Telangiectasia   Conjunctiva/Sclera White and quiet White and quiet   Cornea Arcus Arcus   Anterior Chamber Deep and quiet Deep and quiet   Iris Round and dilated Round and dilated   Lens Three piece PC IOL in good position Three piece PC IOL in good position   Vitreous Vitreous syneresis, Posterior vitreous detachment Vitreous syneresis, Posterior vitreous detachment       Fundus Exam      Right Left   Disc 2+ Pallor, Hemorrhage at 1200, inf rim thinning Normal, pink rim   C/D Ratio 0.85 0.5   Macula Blunted foveal reflex, Microaneurysms/IRH, Cystic changes, multiple blot hemorrhages nasally Blunted foveal reflex, Retinal pigment epithelial mottling, No heme or edema   Vessels Vascular attenuation Vascular attenuation   Periphery Attached, scattered 360 DBH mostly inferiorly  Attached          IMAGING AND PROCEDURES  Imaging and Procedures for 10/06/17  OCT, Retina - OU - Both Eyes     Right Eye Quality was borderline. Central Foveal Thickness: 358. Progression has worsened. Findings include abnormal foveal contour, intraretinal fluid, subretinal fluid.   Left Eye Quality was good. Central Foveal Thickness: 265. Progression has been stable. Findings include normal foveal contour, no IRF, no SRF.   Notes *Images captured and stored on drive  Diagnosis / Impression:  OD: mild CME - interval worsening of CME OS: NFP, No IRF/SRF  Clinical management:  See below  Abbreviations: NFP - Normal foveal profile. CME - cystoid macular edema. PED - pigment epithelial  detachment. IRF - intraretinal fluid. SRF - subretinal fluid. EZ - ellipsoid zone. ERM - epiretinal membrane. ORA - outer retinal atrophy. ORT - outer retinal tubulation. SRHM - subretinal hyper-reflective material         Intravitreal Injection, Pharmacologic Agent - OD - Right Eye     Time Out 10/06/2017. 3:15 PM. Confirmed correct patient, procedure, site, and patient consented.    Anesthesia Topical anesthesia was used. Anesthetic medications included Lidocaine 2%, Tetracaine 0.5%.   Procedure Preparation included 5% betadine to ocular surface. A supplied needle was used.   Injection: 1.25 mg Bevacizumab 1.25mg /0.63ml   NDC: 48546-270-35    Lot: [id number]     Expiration Date: 11/09/2017   Route: Intravitreal   Site: Right Eye   Waste: 0 mg  Post-op Post injection exam found visual acuity of at least counting fingers. The patient tolerated the procedure well. There were no complications. The patient received written and verbal post procedure care education.                 ASSESSMENT/PLAN:    ICD-10-CM   1. Branch retinal vein occlusion of right eye with macular edema H34.8310 Intravitreal Injection, Pharmacologic Agent - OD - Right Eye    Bevacizumab (AVASTIN) SOLN 1.25 mg  2. Retinal edema H35.81 OCT, Retina - OU - Both Eyes  3. Hypertensive retinopathy of both eyes H35.033   4. 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 Posterior vitreous detachment of both eyes H43.813   6. Pseudophakia of both eyes Z96.1     1,2. BRVO w/ macular edema OD - FA from initial visit (2.19.19) shows slightly delayed filling time OD; late leakage corresponding to CME, and inferior hemispheric Mas -- suggestive of mild/remote HRVO - recommend trial of ketorolac and PF QID OD, but pt only received ketorolac from pharmacy -- interval worsening of CME noted today - BCVA also dropped from 20/80 to 20/100 - discussed findings, prognosis and potential treatment options - D/C (until follow up) PF and ketorolac QID OD  - recommend IVA OD #1  today - pt wishes to proceed - RBA of procedure discussed, questions answered - informed consent obtained and signed - see procedure note - pt tolerated procedure well - Vision and visual potential OD complicated by #4 below - F/U 4 weeks for repeat DFE/OCT, possible injection  3. Hypertensive retinopathy OU - discussed importance  of tight BP control - monitor  4. Optic neuropathy OD - significant cupping and pallor of disc OD - +rAPD OD - suspect glaucomatous damage vs ischemic optic neuropathy - MRI recommended by Dr. 04-15-2004, but pt has not yet scheduled - monitor  5. PVD / vitreous syneresis OU  Discussed findings and prognosis  No RT or RD on 360 peripheral exam  Reviewed s/s of RT/RD  Strict return precautions for any such RT/RD signs/symptoms  6. Pseudophakia OU  - s/p CE/IOL  - beautiful surgery, doing well  - monitor    Ophthalmic Meds Ordered this visit:  Meds ordered this encounter  Medications  . Bevacizumab (AVASTIN) SOLN 1.25 mg       Return in about 4 weeks (around 11/03/2017) for F/U CME OD.  There are no Patient Instructions on file for this visit.   Explained the diagnoses, plan, and follow up with the patient and they expressed understanding.  Patient expressed understanding of the importance of proper follow up care.   This document serves as a record of services personally performed by 17/02/2018,  MD, PhD. It was created on their behalf by Catha Brow, [place], a certified ophthalmic assistant. The creation of this record is the provider's dictation and/or activities during the visit.  Electronically signed by: Catha Brow, [place]  10/06/17 11:40 PM   Gardiner Sleeper, M.D., Ph.D. Diseases & Surgery of the Retina and [place] 10/06/17  I have reviewed the above documentation for accuracy and completeness, and I agree with the above. Gardiner Sleeper, M.D., Ph.D. 10/06/17 11:46 PM    Abbreviations: M myopia (nearsighted); A astigmatism; H hyperopia (farsighted); P presbyopia; Mrx spectacle prescription;  CTL contact lenses; OD right eye; OS left eye; OU both eyes  XT exotropia; ET esotropia; PEK punctate epithelial keratitis; PEE punctate epithelial erosions; DES dry eye syndrome; MGD meibomian gland dysfunction; ATs artificial tears;  PFAT's preservative free artificial tears; [place] nuclear sclerotic cataract; PSC posterior subcapsular cataract; ERM epi-retinal membrane; PVD posterior vitreous detachment; RD retinal detachment; DM diabetes mellitus; DR diabetic retinopathy; NPDR non-proliferative diabetic retinopathy; PDR proliferative diabetic retinopathy; CSME clinically significant macular edema; DME diabetic macular edema; dbh dot blot hemorrhages; CWS cotton wool spot; POAG primary open angle glaucoma; C/D cup-to-disc ratio; HVF humphrey visual field; GVF goldmann visual field; OCT optical coherence tomography; IOP intraocular pressure; BRVO Branch retinal vein occlusion; CRVO central retinal vein occlusion; CRAO central retinal artery occlusion; BRAO branch retinal artery occlusion; RT retinal tear; SB scleral buckle; PPV pars plana vitrectomy; VH Vitreous hemorrhage; PRP panretinal laser photocoagulation; IVK intravitreal kenalog; VMT vitreomacular traction; MH Macular hole;  NVD neovascularization of the disc; NVE neovascularization elsewhere; AREDS age related eye disease study; ARMD age related macular degeneration; POAG primary open angle glaucoma; EBMD epithelial/anterior basement membrane dystrophy; ACIOL anterior chamber intraocular lens; IOL intraocular lens; PCIOL posterior chamber intraocular lens; Phaco/IOL phacoemulsification with intraocular lens placement; [place] photorefractive keratectomy; LASIK laser assisted in situ keratomileusis; HTN hypertension; DM diabetes mellitus; COPD chronic obstructive pulmonary disease

## 2017-10-06 ENCOUNTER — Encounter (INDEPENDENT_AMBULATORY_CARE_PROVIDER_SITE_OTHER): Payer: Self-pay | Admitting: Ophthalmology

## 2017-10-06 ENCOUNTER — Ambulatory Visit (INDEPENDENT_AMBULATORY_CARE_PROVIDER_SITE_OTHER): Payer: Medicare Other | Admitting: Ophthalmology

## 2017-10-06 DIAGNOSIS — H34831 Tributary (branch) retinal vein occlusion, right eye, with macular edema: Secondary | ICD-10-CM

## 2017-10-06 DIAGNOSIS — H35033 Hypertensive retinopathy, bilateral: Secondary | ICD-10-CM

## 2017-10-06 DIAGNOSIS — H3581 Retinal edema: Secondary | ICD-10-CM

## 2017-10-06 DIAGNOSIS — Z961 Presence of intraocular lens: Secondary | ICD-10-CM

## 2017-10-06 DIAGNOSIS — H469 Unspecified optic neuritis: Secondary | ICD-10-CM

## 2017-10-06 DIAGNOSIS — H43813 Vitreous degeneration, bilateral: Secondary | ICD-10-CM | POA: Diagnosis not present

## 2017-10-06 MED ORDER — BEVACIZUMAB CHEMO INJECTION 1.25MG/0.05ML SYRINGE FOR KALEIDOSCOPE
1.2500 mg | INTRAVITREAL | Status: AC
  Administered 2017-10-06: 1.25 mg via INTRAVITREAL

## 2017-10-23 ENCOUNTER — Ambulatory Visit (INDEPENDENT_AMBULATORY_CARE_PROVIDER_SITE_OTHER): Payer: Medicare Other | Admitting: *Deleted

## 2017-10-23 DIAGNOSIS — I482 Chronic atrial fibrillation, unspecified: Secondary | ICD-10-CM

## 2017-10-23 DIAGNOSIS — Z7901 Long term (current) use of anticoagulants: Secondary | ICD-10-CM | POA: Diagnosis not present

## 2017-10-23 DIAGNOSIS — Z5181 Encounter for therapeutic drug level monitoring: Secondary | ICD-10-CM | POA: Diagnosis not present

## 2017-10-23 LAB — POCT INR: INR: 3.5

## 2017-10-23 NOTE — Patient Instructions (Signed)
Description   Do not take any Coumadin today then continue on same dose 1/2 tablet daily except 1 tablet on Mondays and Fridays.  Call if on any new medications or scheduled for any procedures [phone]. Recheck INR in 3 weeks.

## 2017-10-30 NOTE — Progress Notes (Signed)
[facility] Note  11/03/2017     CHIEF COMPLAINT Patient presents for Retina Follow Up   HISTORY OF PRESENT ILLNESS: Richard Bradley is a 84 y.o. male who presents to the clinic today for:   HPI    Retina Follow Up    Patient presents with  Other.  In right eye.  Severity is moderate.  Since onset it is stable.  I, the attending physician,  performed the HPI with the patient and updated documentation appropriately.          Comments    F/U CME OD; Pt states OU VA is stable; Pt reports there has been no noticeable change in [place] since last visit; Pt reports he tolerated last injection well; Pt reports he is prepared to have another injection if needed, today;        Last edited by Bernarda Caffey, MD on 11/03/2017  1:54 PM. (History)    Pt states he "feels good"; Pt states he feels like OU VA is doing well;   Referring physician: Josetta Huddle, MD [address] [place], [place] [address]  HISTORICAL INFORMATION:   Selected notes from the MEDICAL RECORD NUMBER Referred by Dr. Elliot Dally for AMD/CME OD;  LEE- 02.13.19 [BCVA OD: 20/70 OS: 20/20] Ocular Hx- pseudophakia OU (1989), POAG OU PMH- myasthenia gravis, hx prostate ca, CHF, A-Fib, COPD    CURRENT MEDICATIONS: Current Outpatient Medications (Ophthalmic Drugs)  Medication Sig  . ALPHAGAN P 0.1 % SOLN INT 1 GTT IN OU BID  . ketorolac (ACULAR) 0.5 % ophthalmic solution Place 1 drop into the right eye 4 (four) times daily.  . prednisoLONE acetate (PRED FORTE) 1 % ophthalmic suspension Place 1 drop into the right eye 4 (four) times daily.   No current facility-administered medications for this visit.  (Ophthalmic Drugs)   Current Outpatient Medications (Other)  Medication Sig  . azithromycin (ZITHROMAX) 250 MG tablet TK 2 TS PO ONE DAY ONE THEN TK 1 T PO ON DAYS 2-7  . Cholecalciferol (VITAMIN D) 2000 units CAPS Take 2,000 Units by mouth daily.  Marland Kitchen doxycycline (VIBRAMYCIN) 50 MG capsule  Take 2 capsules (100 mg total) by mouth 2 (two) times daily.  Marland Kitchen escitalopram (LEXAPRO) 5 MG tablet Take 5 mg by mouth daily.   Marland Kitchen FIBER PO Take 1 tablet by mouth 3 (three) times daily.   . fluticasone (FLONASE) 50 MCG/ACT nasal spray Place 2 sprays into both nostrils 2 (two) times daily.   . furosemide (LASIX) 40 MG tablet Take 1 tablet (40 mg total) by mouth daily. (Patient taking differently: Take 20-40 mg by mouth See admin instructions. Takes 40 mg in morning at 20 mg at lunch)  . guaiFENesin (MUCINEX) 600 MG 12 hr tablet Take 600 mg by mouth at bedtime as needed for to loosen phlegm.   Marland Kitchen HYDROcodone-acetaminophen (NORCO) 10-325 MG tablet Take 1 tablet by mouth every 6 (six) hours as needed (for pain).  . metoprolol tartrate (LOPRESSOR) 25 MG tablet Take 25 mg by mouth 2 (two) times daily.  . nitroGLYCERIN (NITRODUR - DOSED IN MG/24 HR) 0.2 mg/hr patch Apply one (1) patch to the left foot every other day.  . pantoprazole (PROTONIX) 40 MG tablet Take 1 tablet (40 mg total) by mouth daily.  . polyethylene glycol (MIRALAX / GLYCOLAX) packet Take 17 g by mouth daily as needed for moderate constipation. For constipation (Patient taking differently: Take 17 g by mouth daily as needed  for mild constipation or moderate constipation. )  . potassium chloride SA (K-DUR,KLOR-CON) 20 MEQ tablet Take 1 tablet (20 mEq total) by mouth daily. (Patient taking differently: Take 20 mEq by mouth daily with lunch. )  . predniSONE (DELTASONE) 10 MG tablet Take 2 tablets (20 mg total) by mouth daily with breakfast.  . vitamin B-12 (CYANOCOBALAMIN) 1000 MCG tablet Take 1,000 mcg by mouth daily with lunch.   . warfarin (COUMADIN) 5 MG tablet TAKE AS DIRECTED BY COUMADIN CLINIC   Current Facility-Administered Medications (Other)  Medication Route  . Bevacizumab (AVASTIN) SOLN 1.25 mg Intravitreal  . Bevacizumab (AVASTIN) SOLN 1.25 mg Intravitreal      REVIEW OF SYSTEMS: ROS    Positive for: Musculoskeletal, Eyes    Negative for: Constitutional, Gastrointestinal, Neurological, Skin, Genitourinary, HENT, Endocrine, Cardiovascular, Respiratory, Psychiatric, Allergic/Imm, Heme/Lymph   Last edited by Cherrie Gauze, COA on 11/03/2017  1:08 PM. (History)       ALLERGIES Allergies  Allergen Reactions  . Tape Other (See Comments)    PATIENT'S SKIN IS VERY THIN AND WILL TEAR AND BRUISE VERY EASILY!! Paper take is ok    PAST MEDICAL HISTORY Past Medical History:  Diagnosis Date  . Atrial fibrillation ([place])   . Cellulitis    left arm  . CHF (congestive heart failure) ([place])   . Dyspnea    oxygen prn  . Dysrhythmia    afib  . GERD (gastroesophageal reflux disease)    ocassional  . History of kidney stones    passed  . Myasthenia gravis   . Osteoarthritis   . Pneumonia    Hstory of  . 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 Prostate cancer ([place]) dx'd 1982   treated with surgery and radiation.  Basal cell cancer- many  . Sepsis ([place])   . Urine incontinence   . Varicose veins of left lower extremity    Past Surgical History:  Procedure Laterality Date  . AMPUTATION Left 03/20/2016   Procedure: Left Great Toe Amputation at Metatarsophalangeal Joint;  Surgeon: Newt Minion, MD;  Location: [facility];  Service: Orthopedics;  Laterality: Left;  . AMPUTATION Left 06/11/2016   Procedure: Left 2nd Toe Amputation;  Surgeon: Newt Minion, MD;  Location: [facility];  Service: Orthopedics;  Laterality: Left;  . AMPUTATION Left 07/12/2016   Procedure: LEFT 3rd TOE AMPUTATION;  Surgeon: Newt Minion, MD;  Location: [facility];  Service: Orthopedics;  Laterality: Left;  . AMPUTATION Left 09/11/2016   Procedure: Left Transmetatarsal Amputation;  Surgeon: Newt Minion, MD;  Location: [facility];  Service: Orthopedics;  Laterality: Left;  . appendectomy    . APPENDECTOMY    . COLONOSCOPY    . HERNIA REPAIR Bilateral    Inguinal  . I&D EXTREMITY Left 10/12/2014   Procedure: IRRIGATION AND DEBRIDEMENT  INDEX FINGER;  Surgeon: Iran Planas, MD;  Location: [facility];  Service: Orthopedics;  Laterality: Left;  . KYPHOPLASTY  2012 ?   T 12- L1  . LAMINECTOMY  2005 ?   lumbar- 4-5  . PROSTATE SURGERY    . ROTATOR CUFF REPAIR Right   . STUMP REVISION Left 12/11/2016   Procedure: Revision Left Transmetatarsal Amputation, Apply Wound VAC;  Surgeon: Newt Minion, MD;  Location: [facility];  Service: Orthopedics;  Laterality: Left;  . TOTAL KNEE ARTHROPLASTY Right     FAMILY HISTORY Family History  Problem Relation Age of Onset  . Heart disease Father   . Heart attack Father   .  Hypertension Father   . Heart disease Brother   . Heart disease Paternal Grandfather   . Stroke Mother   . Parkinsonism Sister   . Heart disease Sister   . Dementia Sister     SOCIAL HISTORY Social History   Tobacco Use  . Smoking status: Never Smoker  . Smokeless tobacco: Never Used  Substance Use Topics  . Alcohol use: Yes    Alcohol/week: 1.2 oz    Types: 2 Cans of beer per week    Comment: occasional beer  . Drug use: No         OPHTHALMIC EXAM:  Base Eye Exam    Visual Acuity (Snellen - Linear)      Right Left   Dist [place] 20/200 20/30   Dist ph  20/80 NI       Tonometry (Tonopen, 1:05 PM)      Right Left   Pressure 13 13       Pupils      Dark Light Shape React APD   Right 2 2 Round Minimal Trace   Left 2 2 Round Minimal None       Visual Fields (Counting fingers)      Left Right    Full    Restrictions  Partial outer superior temporal, inferior temporal deficiencies       Extraocular Movement      Right Left    Full, Ortho Full, Ortho       Neuro/Psych    Oriented x3:  Yes   Mood/Affect:  Normal       Dilation    Both eyes:  1.0% Mydriacyl, 2.5% Phenylephrine @ 1:04 PM        Slit Lamp and Fundus Exam    Slit Lamp Exam      Right Left   Lids/Lashes Ptosis UL, Meibomian gland dysfunction, Telangiectasia Ptosis UL, Meibomian gland dysfunction, Telangiectasia    Conjunctiva/Sclera White and quiet White and quiet   Cornea Arcus Arcus   Anterior Chamber Deep and quiet Deep and quiet   Iris Round and dilated Round and dilated   Lens Three piece PC IOL in good position Three piece PC IOL in good position   Vitreous Vitreous syneresis, Posterior vitreous detachment Vitreous syneresis, Posterior vitreous detachment       Fundus Exam      Right Left   Disc 2+ Pallor, Hemorrhage at 1200, inf rim thinning Normal, pink rim   C/D Ratio 0.85 0.5   Macula Blunted foveal reflex, Microaneurysms/IRH, Cystic changes, -- all improved Blunted foveal reflex, Retinal pigment epithelial mottling, No heme or edema, Choroidal nevus along inferior arcade   Vessels Vascular attenuation Vascular attenuation   Periphery Attached, scattered 360 DBH mostly inferiorly  Attached          IMAGING AND PROCEDURES  Imaging and Procedures for 11/03/17  OCT, Retina - OU - Both Eyes       Right Eye Quality was borderline. Central Foveal Thickness: 291. Progression has improved. Findings include intraretinal fluid, subretinal fluid, normal foveal contour (Sub-fovea disruption of ellipsoid).   Left Eye Quality was good. Central Foveal Thickness: 264. Progression has been stable. Findings include normal foveal contour, no IRF, no SRF.   Notes *Images captured and stored on drive  Diagnosis / Impression:  OD: mild CME - interval improvement of CME -- almost complete resolution OS: NFP, No IRF/SRF  Clinical management:  See below  Abbreviations: NFP - Normal foveal profile. CME - cystoid macular  edema. PED - pigment epithelial detachment. IRF - intraretinal fluid. SRF - subretinal fluid. EZ - ellipsoid zone. ERM - epiretinal membrane. ORA - outer retinal atrophy. ORT - outer retinal tubulation. SRHM - subretinal hyper-reflective material         Intravitreal Injection, Pharmacologic Agent - OD - Right Eye       Time Out 11/03/2017. 2:15 PM. Confirmed correct  patient, procedure, site, and patient consented.   Anesthesia Topical anesthesia was used. Anesthetic medications included Lidocaine 2%, Tetracaine 0.5%.   Procedure Preparation included 5% betadine to ocular surface, eyelid speculum. A supplied needle was used.   Injection: 1.25 mg Bevacizumab 1.25mg /0.74ml   NDC: 52841-324-40    Lot: [id number]     Expiration Date: 12/01/2017   Route: Intravitreal   Site: Right Eye   Waste: 0 mg  Post-op Post injection exam found visual acuity of at least counting fingers. The patient tolerated the procedure well. There were no complications. The patient received written and verbal post procedure care education.                 ASSESSMENT/PLAN:    ICD-10-CM   1. Branch retinal vein occlusion of right eye with macular edema H34.8310 Intravitreal Injection, Pharmacologic Agent - OD - Right Eye    Bevacizumab (AVASTIN) SOLN 1.25 mg  2. Retinal edema H35.81 OCT, Retina - OU - Both Eyes  3. Hypertensive retinopathy of both eyes H35.033   4. 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 Posterior vitreous detachment of both eyes H43.813   6. Pseudophakia of both eyes Z96.1     1,2. BRVO w/ macular edema OD - FA from initial visit (2.19.19) shows slightly delayed filling time OD; late leakage corresponding to CME, and inferior hemispheric Mas -- suggestive of mild/remote HRVO - BCVA also dropped from 20/80 to 20/100 - S/P IVA OD #1 (03.11.19)  - OCT with almost complete resolution of IRF - BCVA improved back to 20/80 - recommend IVA OD #2 today (04.08.19) - pt wishes to proceed - RBA of procedure discussed, questions answered - informed consent obtained and signed - see procedure note - pt tolerated procedure well - Vision and visual potential OD complicated by #4 below - F/U 4-5 weeks for repeat DFE/OCT, possible injection  3. Hypertensive retinopathy OU - discussed importance of tight BP control - monitor  4. Optic neuropathy OD -  stable - significant cupping and pallor of disc OD - +rAPD OD - suspect glaucomatous damage vs ischemic optic neuropathy - MRI recommended by Dr. 17.08.19, but pt has not yet scheduled - monitor  5. PVD / vitreous syneresis OU  Discussed findings and prognosis  No RT or RD on 360 peripheral exam  Reviewed s/s of RT/RD  Strict return precautions for any such RT/RD signs/symptoms  6. Pseudophakia OU  - s/p CE/IOL  - beautiful surgery, doing well  - monitor    Ophthalmic Meds Ordered this visit:  Meds ordered this encounter  Medications  . Bevacizumab (AVASTIN) SOLN 1.25 mg       Return in about 5 weeks (around 12/08/2017) for F/U BRVO w/ ME OD, Dilated exam, OCT.  There are no Patient Instructions on file for this visit.   Explained the diagnoses, plan, and follow up with the patient and they expressed understanding.  Patient expressed understanding of the importance of proper follow up care.   This document serves as a record of services personally performed by 12/21/2017, MD, PhD. It was created  on their behalf by Catha Brow, [place], a certified ophthalmic assistant. The creation of this record is the provider's dictation and/or activities during the visit.  Electronically signed by: Catha Brow, [place]  11/03/17 5:20 PM    Gardiner Sleeper, M.D., Ph.D. Diseases & Surgery of the Retina and [place] 11/03/17  I have reviewed the above documentation for accuracy and completeness, and I agree with the above. Gardiner Sleeper, M.D., Ph.D. 11/03/17 5:22 PM    Abbreviations: M myopia (nearsighted); A astigmatism; H hyperopia (farsighted); P presbyopia; Mrx spectacle prescription;  CTL contact lenses; OD right eye; OS left eye; OU both eyes  XT exotropia; ET esotropia; PEK punctate epithelial keratitis; PEE punctate epithelial erosions; DES dry eye syndrome; MGD meibomian gland dysfunction; ATs artificial tears; PFAT's preservative free  artificial tears; [place] nuclear sclerotic cataract; PSC posterior subcapsular cataract; ERM epi-retinal membrane; PVD posterior vitreous detachment; RD retinal detachment; DM diabetes mellitus; DR diabetic retinopathy; NPDR non-proliferative diabetic retinopathy; PDR proliferative diabetic retinopathy; CSME clinically significant macular edema; DME diabetic macular edema; dbh dot blot hemorrhages; CWS cotton wool spot; POAG primary open angle glaucoma; C/D cup-to-disc ratio; HVF humphrey visual field; GVF goldmann visual field; OCT optical coherence tomography; IOP intraocular pressure; BRVO Branch retinal vein occlusion; CRVO central retinal vein occlusion; CRAO central retinal artery occlusion; BRAO branch retinal artery occlusion; RT retinal tear; SB scleral buckle; PPV pars plana vitrectomy; VH Vitreous hemorrhage; PRP panretinal laser photocoagulation; IVK intravitreal kenalog; VMT vitreomacular traction; MH Macular hole;  NVD neovascularization of the disc; NVE neovascularization elsewhere; AREDS age related eye disease study; ARMD age related macular degeneration; POAG primary open angle glaucoma; EBMD epithelial/anterior basement membrane dystrophy; ACIOL anterior chamber intraocular lens; IOL intraocular lens; PCIOL posterior chamber intraocular lens; Phaco/IOL phacoemulsification with intraocular lens placement; [place] photorefractive keratectomy; LASIK laser assisted in situ keratomileusis; HTN hypertension; DM diabetes mellitus; COPD chronic obstructive pulmonary disease

## 2017-11-03 ENCOUNTER — Encounter (INDEPENDENT_AMBULATORY_CARE_PROVIDER_SITE_OTHER): Payer: Self-pay | Admitting: Ophthalmology

## 2017-11-03 ENCOUNTER — Ambulatory Visit (INDEPENDENT_AMBULATORY_CARE_PROVIDER_SITE_OTHER): Payer: Medicare Other | Admitting: Ophthalmology

## 2017-11-03 DIAGNOSIS — H469 Unspecified optic neuritis: Secondary | ICD-10-CM | POA: Diagnosis not present

## 2017-11-03 DIAGNOSIS — Z961 Presence of intraocular lens: Secondary | ICD-10-CM

## 2017-11-03 DIAGNOSIS — H43813 Vitreous degeneration, bilateral: Secondary | ICD-10-CM

## 2017-11-03 DIAGNOSIS — H3581 Retinal edema: Secondary | ICD-10-CM | POA: Diagnosis not present

## 2017-11-03 DIAGNOSIS — H35033 Hypertensive retinopathy, bilateral: Secondary | ICD-10-CM | POA: Diagnosis not present

## 2017-11-03 DIAGNOSIS — H34831 Tributary (branch) retinal vein occlusion, right eye, with macular edema: Secondary | ICD-10-CM

## 2017-11-03 MED ORDER — BEVACIZUMAB CHEMO INJECTION 1.25MG/0.05ML SYRINGE FOR KALEIDOSCOPE
1.2500 mg | INTRAVITREAL | Status: AC
  Administered 2017-11-03: 1.25 mg via INTRAVITREAL

## 2017-11-13 ENCOUNTER — Ambulatory Visit (INDEPENDENT_AMBULATORY_CARE_PROVIDER_SITE_OTHER): Payer: Medicare Other | Admitting: *Deleted

## 2017-11-13 DIAGNOSIS — I482 Chronic atrial fibrillation, unspecified: Secondary | ICD-10-CM

## 2017-11-13 DIAGNOSIS — Z5181 Encounter for therapeutic drug level monitoring: Secondary | ICD-10-CM | POA: Diagnosis not present

## 2017-11-13 DIAGNOSIS — Z7901 Long term (current) use of anticoagulants: Secondary | ICD-10-CM

## 2017-11-13 LAB — POCT INR: INR: 2.5

## 2017-11-13 NOTE — Patient Instructions (Signed)
Description   Continue on same dose 1/2 tablet daily except 1 tablet on Mondays and Fridays. Call if on any new medications or scheduled for any procedures [phone]. Recheck INR in 4 weeks.

## 2017-11-15 ENCOUNTER — Other Ambulatory Visit: Payer: Self-pay | Admitting: Neurology

## 2017-11-17 ENCOUNTER — Telehealth: Payer: Self-pay | Admitting: Neurology

## 2017-11-17 NOTE — Telephone Encounter (Signed)
I called the patient and confirm that he is taking 17.5 mg of prednisone daily at this point.  A prescription was sent in for the 5 mg tablets.

## 2017-11-26 NOTE — Progress Notes (Signed)
Cardiology Office Note    Date:  11/27/2017   ID:  KASPER MUDRICK, DOB 1926/04/04, MRN [id number]  PCP:  Josetta Huddle, MD  Cardiologist: Sinclair Grooms, MD   Chief Complaint  Patient presents with  . Atrial Fibrillation    History of Present Illness:  Richard Bradley is a 83 y.o. male with a history of permanent atrial fibrillation/flutter on coumadin for anticoagulation, chronic combined CHF, myasthenia gravis on prednisone, HTN, DM, PAD s/p left TMT amputation, severe MR, chronic dyspnea and pulmonary fibrosis.  Developing some anxiety.  Has difficulty sleeping.  Denies chest pain.  Has some orthopnea when he lies down.  No peripheral edema is been noted.  He has not had syncope.  His sitter is with him.  She states that some medication changes have been made better we are not aware of.   Past Medical History:  Diagnosis Date  . Atrial fibrillation ([place])   . Cellulitis    left arm  . CHF (congestive heart failure) ([place])   . Dyspnea    oxygen prn  . Dysrhythmia    afib  . GERD (gastroesophageal reflux disease)    ocassional  . History of kidney stones    passed  . Myasthenia gravis   . Osteoarthritis   . Pneumonia    Hstory of  . 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 Prostate cancer ([place]) dx'd 1982   treated with surgery and radiation.  Basal cell cancer- many  . Sepsis ([place])   . Urine incontinence   . Varicose veins of left lower extremity     Past Surgical History:  Procedure Laterality Date  . AMPUTATION Left 03/20/2016   Procedure: Left Great Toe Amputation at Metatarsophalangeal Joint;  Surgeon: Newt Minion, MD;  Location: [place] [facility] [place];  Service: Orthopedics;  Laterality: Left;  . AMPUTATION Left 06/11/2016   Procedure: Left 2nd Toe Amputation;  Surgeon: Newt Minion, MD;  Location: [place];  Service: Orthopedics;  Laterality: Left;  . AMPUTATION Left 07/12/2016   Procedure: LEFT 3rd TOE AMPUTATION;  Surgeon: Newt Minion, MD;  Location: [facility];  Service: Orthopedics;  Laterality: Left;  . AMPUTATION Left 09/11/2016   Procedure: Left Transmetatarsal Amputation;  Surgeon: Newt Minion, MD;  Location: [facility];  Service: Orthopedics;  Laterality: Left;  . appendectomy    . APPENDECTOMY    . COLONOSCOPY    . HERNIA REPAIR Bilateral    Inguinal  . I&D EXTREMITY Left 10/12/2014   Procedure: IRRIGATION AND DEBRIDEMENT INDEX FINGER;  Surgeon: Iran Planas, MD;  Location: [facility];  Service: Orthopedics;  Laterality: Left;  . KYPHOPLASTY  2012 ?   T 12- L1  . LAMINECTOMY  2005 ?   lumbar- 4-5  . PROSTATE SURGERY    . ROTATOR CUFF REPAIR Right   . STUMP REVISION Left 12/11/2016   Procedure: Revision Left Transmetatarsal Amputation, Apply Wound VAC;  Surgeon: Newt Minion, MD;  Location: [facility];  Service: Orthopedics;  Laterality: Left;  . TOTAL KNEE ARTHROPLASTY Right     Current Medications: Outpatient Medications Prior to Visit  Medication Sig Dispense Refill  . ALPHAGAN P 0.1 % SOLN INT 1 GTT IN OU BID  3  . Cholecalciferol (VITAMIN D) 2000 units CAPS Take 2,000 Units by mouth daily.    Marland Kitchen doxycycline (VIBRAMYCIN) 50 MG capsule Take 2 capsules (100 mg total) by mouth 2 (two) times daily. 60 capsule 0  . escitalopram (LEXAPRO) 5 MG  tablet Take 5 mg by mouth daily.   3  . FIBER PO Take 1 tablet by mouth 3 (three) times daily.     . fluticasone (FLONASE) 50 MCG/ACT nasal spray Place 2 sprays into both nostrils 2 (two) times daily.     . furosemide (LASIX) 40 MG tablet Take 40 mg by mouth daily.    Marland Kitchen guaiFENesin (MUCINEX) 600 MG 12 hr tablet Take 600 mg by mouth at bedtime as needed for to loosen phlegm.     Marland Kitchen HYDROcodone-acetaminophen (NORCO) 10-325 MG tablet Take 1 tablet by mouth every 6 (six) hours as needed (for pain). 20 tablet 0  . ketorolac (ACULAR) 0.5 % ophthalmic solution Place 1 drop into the right eye 4 (four) times daily. 10 mL 2  . metoprolol tartrate (LOPRESSOR) 25 MG tablet Take 25 mg by mouth 2 (two) times daily.  3  .  nitroGLYCERIN (NITRODUR - DOSED IN MG/24 HR) 0.2 mg/hr patch Apply one (1) patch to the left foot every other day.  12  . pantoprazole (PROTONIX) 40 MG tablet Take 1 tablet (40 mg total) by mouth daily. 30 tablet 3  . polyethylene glycol (MIRALAX / GLYCOLAX) packet Take 17 g by mouth daily as needed for moderate constipation. For constipation (Patient taking differently: Take 17 g by mouth daily as needed for mild constipation or moderate constipation. ) 30 each 0  . potassium chloride SA (K-DUR,KLOR-CON) 20 MEQ tablet Take 1 tablet (20 mEq total) by mouth daily. (Patient taking differently: Take 20 mEq by mouth daily with lunch. ) 30 tablet 3  . prednisoLONE acetate (PRED FORTE) 1 % ophthalmic suspension Place 1 drop into the right eye 4 (four) times daily. 10 mL 0  . predniSONE (DELTASONE) 10 MG tablet Take 2 tablets (20 mg total) by mouth daily with breakfast.    . predniSONE (DELTASONE) 5 MG tablet TAKE 1 AND 1/2 TABLETS(7.5 MG) BY MOUTH DAILY WITH BREAKFAST 135 tablet 1  . vitamin B-12 (CYANOCOBALAMIN) 1000 MCG tablet Take 1,000 mcg by mouth daily with lunch.     . warfarin (COUMADIN) 5 MG tablet TAKE AS DIRECTED BY COUMADIN CLINIC 90 tablet 1  . azithromycin (ZITHROMAX) 250 MG tablet TK 2 TS PO ONE DAY ONE THEN TK 1 T PO ON DAYS 2-7  0  . furosemide (LASIX) 40 MG tablet Take 1 tablet (40 mg total) by mouth daily. (Patient taking differently: Take 20-40 mg by mouth See admin instructions. Takes 40 mg in morning at 20 mg at lunch) 90 tablet 3   Facility-Administered Medications Prior to Visit  Medication Dose Route Frequency Provider Last Rate Last Dose  . Bevacizumab (AVASTIN) SOLN 1.25 mg  1.25 mg Intravitreal  Bernarda Caffey, MD   1.25 mg at 10/06/17 1616  . Bevacizumab (AVASTIN) SOLN 1.25 mg  1.25 mg Intravitreal  Bernarda Caffey, MD   1.25 mg at 11/03/17 1719     Allergies:   Tape   Social History   Socioeconomic History  . Marital status: Widowed    Spouse name: Not on file  . Number  of children: 2  . Years of education: 23  . Highest education level: Not on file  Occupational History  . Occupation: Retired    Fish farm manager: [employer]  . Financial resource strain: Not on file  . Food insecurity:    Worry: Not on file    Inability: Not on file  . Transportation needs:  Medical: Not on file    Non-medical: Not on file  Tobacco Use  . Smoking status: Never Smoker  . Smokeless tobacco: Never Used  Substance and Sexual Activity  . Alcohol use: Yes    Alcohol/week: 1.2 oz    Types: 2 Cans of beer per week    Comment: occasional beer  . Drug use: No  . Sexual activity: Not on file    Comment: Widowed  Lifestyle  . Physical activity:    Days per week: Not on file    Minutes per session: Not on file  . Stress: Not on file  Relationships  . Social connections:    Talks on phone: Not on file    Gets together: Not on file    Attends religious service: Not on file    Active member of club or organization: Not on file    Attends meetings of clubs or organizations: Not on file    Relationship status: Not on file  Other Topics Concern  . Not on file  Social History Narrative   From [place], moving to [place]   Widowed   Right-handed   Caffeine: 1 cup of coffee per day     Family History:  The patient's family history includes Dementia in his sister; Heart attack in his father; Heart disease in his brother, father, paternal grandfather, and sister; Hypertension in his father; Parkinsonism in his sister; Stroke in his mother.   ROS:   Please see the history of present illness.    Muscle aches and pains.  Anxiety.  Difficulty sleeping. All other systems reviewed and are negative.   PHYSICAL EXAM:   VS:  BP (!) 98/58   Pulse 69   Ht 6\' 3"  (1.905 m)   Wt 163 lb 12.8 oz (74.3 kg)   SpO2 93%   BMI 20.47 kg/m    GEN: Well nourished, well developed, in no acute distress.  Elderly and frail. HEENT:  normal  Neck: no JVD, carotid bruits, or masses Cardiac: IIRR; no murmurs, rubs, or gallops,no edema  Respiratory:  clear to auscultation bilaterally, normal work of breathing GI: soft, nontender, nondistended, + BS MS: no deformity or atrophy  Skin: warm and dry, no rash Neuro:  Alert and Oriented x 3, Strength and sensation are intact Psych: euthymic mood, full affect  Wt Readings from Last 3 Encounters:  11/27/17 163 lb 12.8 oz (74.3 kg)  03/08/17 176 lb 9.4 oz (80.1 kg)  02/06/17 171 lb (77.6 kg)      Studies/Labs Reviewed:   EKG:  EKG  none  Recent Labs: 04/17/2017: ALT 13; B Natriuretic Peptide 265.5; Magnesium 2.1; TSH 0.981 04/18/2017: BUN 17; Creatinine, Ser 1.02; Hemoglobin 10.7; Platelets 168; Potassium 3.4; Sodium 139   Lipid Panel No results found for: CHOL, TRIG, HDL, CHOLHDL, VLDL, LDLCALC, LDLDIRECT  Additional studies/ records that were reviewed today include:  None    ASSESSMENT:    1. Chronic systolic CHF (congestive heart failure) ([place])   2. Hypotension, unspecified hypotension type   3. Chronic atrial fibrillation ([place])   4. Benign essential HTN   5. Anticoagulated on Coumadin      PLAN:  In order of problems listed above:  1. Stable without obvious evidence of volume overload.  No recent assessment of LV function but given age and comorbidities, we will continue to manage the patient expectantly with medical therapy. 2. Hypotension could be of multiple possible etiologies.  Rule out dehydration.  Could be related to low  flow from progressive systolic dysfunction and mitral regurgitation, medication induced adrenal insufficiency, or other effects from medications (also on beta-blocker therapy).  Lab work will help sort out the possibility of dehydration.  Perhaps further medication adjustment will be necessary pending labs.  For the time being no changes been made. 3. Rate is controlled on low-dose beta-blocker therapy.  At his age we may need to  decrease the dose further to allow better blood pressure. 4. See above in the hypotension 5. Continue anticoagulation therapy.  Clinical follow-up in 6 months and again in 1 year.  52-month timeframe can be with APP.  Earlier if shortness of breath or cardiac complaints.    Medication Adjustments/Labs and Tests Ordered: Current medicines are reviewed at length with the patient today.  Concerns regarding medicines are outlined above.  Medication changes, Labs and Tests ordered today are listed in the Patient Instructions below. Patient Instructions  Medication Instructions:  Your physician recommends that you continue on your current medications as directed. Please refer to the Current Medication list given to you today.  Labwork: BMET and Pro BNP today  Testing/Procedures: None  Follow-Up: Your physician recommends that you schedule a follow-up appointment with Truitt Merle, NP as scheduled.  Your physician wants you to follow-up in: 1 year with Dr. Tamala Julian.  You will receive a reminder letter in the mail two months in advance. If you don't receive a letter, please call our office to schedule the follow-up appointment.    Any Other Special Instructions Will Be Listed Below (If Applicable).     If you need a refill on your cardiac medications before your next appointment, please call your pharmacy.      Signed, Sinclair Grooms, MD  11/27/2017 10:57 AM    [facility] [address], [place], [place]  [address] Phone: [phone]; Fax: [phone]

## 2017-11-27 ENCOUNTER — Encounter: Payer: Self-pay | Admitting: Interventional Cardiology

## 2017-11-27 ENCOUNTER — Ambulatory Visit: Payer: Medicare Other | Admitting: Interventional Cardiology

## 2017-11-27 VITALS — BP 98/58 | HR 69 | Ht 75.0 in | Wt 163.8 lb

## 2017-11-27 DIAGNOSIS — I482 Chronic atrial fibrillation, unspecified: Secondary | ICD-10-CM

## 2017-11-27 DIAGNOSIS — Z7901 Long term (current) use of anticoagulants: Secondary | ICD-10-CM

## 2017-11-27 DIAGNOSIS — I5022 Chronic systolic (congestive) heart failure: Secondary | ICD-10-CM

## 2017-11-27 DIAGNOSIS — I959 Hypotension, unspecified: Secondary | ICD-10-CM

## 2017-11-27 DIAGNOSIS — I10 Essential (primary) hypertension: Secondary | ICD-10-CM | POA: Diagnosis not present

## 2017-11-27 LAB — BASIC METABOLIC PANEL
BUN/Creatinine Ratio: 22 (ref 10–24)
BUN: 22 mg/dL (ref 8–27)
CO2: 25 mmol/L (ref 20–29)
Calcium: 8.5 mg/dL — ABNORMAL LOW (ref 8.6–10.2)
Chloride: 97 mmol/L (ref 96–106)
Creatinine, Ser: 0.99 mg/dL (ref 0.76–1.27)
GFR, EST AFRICAN AMERICAN: 78 mL/min/{1.73_m2} (ref >59)
GFR, EST NON AFRICAN AMERICAN: 67 mL/min/{1.73_m2} (ref >59)
Glucose: 113 mg/dL — ABNORMAL HIGH (ref 65–99)
Potassium: 4.3 mmol/L (ref 3.5–5.2)
SODIUM: 137 mmol/L (ref 134–144)

## 2017-11-27 LAB — PRO B NATRIURETIC PEPTIDE: NT-Pro BNP: 1774 pg/mL — ABNORMAL HIGH (ref 0–486)

## 2017-11-27 NOTE — Patient Instructions (Signed)
Medication Instructions:  Your physician recommends that you continue on your current medications as directed. Please refer to the Current Medication list given to you today.  Labwork: BMET and Pro BNP today  Testing/Procedures: None  Follow-Up: Your physician recommends that you schedule a follow-up appointment with Truitt Merle, NP as scheduled.  Your physician wants you to follow-up in: 1 year with Dr. Tamala Julian.  You will receive a reminder letter in the mail two months in advance. If you don't receive a letter, please call our office to schedule the follow-up appointment.    Any Other Special Instructions Will Be Listed Below (If Applicable).     If you need a refill on your cardiac medications before your next appointment, please call your pharmacy.

## 2017-12-01 ENCOUNTER — Telehealth: Payer: Self-pay | Admitting: Interventional Cardiology

## 2017-12-01 NOTE — Telephone Encounter (Signed)
Walk In Pt Form-Medication List dropped off. Placed in [facility] doc box

## 2017-12-02 NOTE — Addendum Note (Signed)
Addended by: Loren Racer on: 12/02/2017 03:22 PM   Modules accepted: Orders

## 2017-12-04 ENCOUNTER — Telehealth: Payer: Self-pay | Admitting: *Deleted

## 2017-12-04 MED ORDER — FUROSEMIDE 40 MG PO TABS: ORAL_TABLET | ORAL | 2 refills | Status: AC

## 2017-12-04 NOTE — Telephone Encounter (Signed)
-----   Message from Belva Crome, MD sent at 12/03/2017  6:31 PM EDT ----- Chage the AM furosemide to 60 mg daily and continue 40 mg for PM dose. ----- Message ----- From: Loren Racer, LPN Sent: 08/06/4035  54:36 AM To: Belva Crome, MD  Spoke with daughter, [name], [place] on file.  Daughter clarified that pt is taking Furosemide 40mg  in the morning and at lunch and takes Spironolactone 25mg  every evening.  Advised I will send message to Dr. Tamala Julian for review and advisement on what to do with pt's diuretics.

## 2017-12-04 NOTE — Telephone Encounter (Signed)
Spoke with daughter and went over recommendations per Dr. Tamala Julian.  Daughter verbalized understanding and was in agreement with plan.

## 2017-12-08 ENCOUNTER — Encounter (INDEPENDENT_AMBULATORY_CARE_PROVIDER_SITE_OTHER): Payer: Medicare Other | Admitting: Ophthalmology

## 2017-12-11 ENCOUNTER — Ambulatory Visit (INDEPENDENT_AMBULATORY_CARE_PROVIDER_SITE_OTHER): Payer: Medicare Other | Admitting: *Deleted

## 2017-12-11 DIAGNOSIS — I482 Chronic atrial fibrillation, unspecified: Secondary | ICD-10-CM

## 2017-12-11 DIAGNOSIS — Z7901 Long term (current) use of anticoagulants: Secondary | ICD-10-CM

## 2017-12-11 DIAGNOSIS — Z5181 Encounter for therapeutic drug level monitoring: Secondary | ICD-10-CM | POA: Diagnosis not present

## 2017-12-11 LAB — POCT INR: INR: 2.7

## 2017-12-11 NOTE — Patient Instructions (Signed)
Description   Continue on same dose 1/2 tablet daily except 1 tablet on Mondays and Fridays. Call if on any new medications or scheduled for any procedures [phone]. Recheck INR in 4 weeks.

## 2017-12-16 NOTE — Progress Notes (Signed)
[facility] Note  12/17/2017     CHIEF COMPLAINT Patient presents for Retina Follow Up   HISTORY OF PRESENT ILLNESS: Richard Bradley is a 84 y.o. male who presents to the clinic today for:   HPI    Retina Follow Up    Patient presents with  CRVO/BRVO.  In right eye.  Severity is moderate.  Duration of 6 weeks.  Since onset it is stable.  I, the attending physician,  performed the HPI with the patient and updated documentation appropriately.          Comments    Pt presents for BRVO OD F/U, pt states VA has been "pitiful" since last visit, pt states he is having a hard time reading the newspaper, he said it takes him most of the day to finish reading it, pt states he is not having any flashes, floater, pain or wavy vision, pt does not want to proceed with another injection today, pt is using Alphagan BID       Last edited by Bernarda Caffey, MD on 12/17/2017  1:18 PM. (History)    Pt states vision is "rotten", states when he wakes up everything is blurry, pt states he has some reading glasses, but they don't seem to help him, he states it took him the entire morning to read the paper this morning, pt states he would like to skip the injection today if it's not going to slow down his progress, but pt stated he would leave it up to Dr. Coralyn Pear whether or not he actually had it   Referring physician: Josetta Huddle, MD [place] [place], [place] [address]  HISTORICAL INFORMATION:   Selected notes from the MEDICAL RECORD NUMBER Referred by Dr. Elliot Dally for AMD/CME OD;  LEE- 02.13.19 [BCVA OD: 20/70 OS: 20/20] Ocular Hx- pseudophakia OU (1989), POAG OU PMH- myasthenia gravis, hx prostate ca, CHF, A-Fib, COPD    CURRENT MEDICATIONS: Current Outpatient Medications (Ophthalmic Drugs)  Medication Sig  . ALPHAGAN P 0.1 % SOLN INT 1 GTT IN OU BID  . ketorolac (ACULAR) 0.5 % ophthalmic solution Place 1 drop into the right eye 4 (four) times daily.   . prednisoLONE acetate (PRED FORTE) 1 % ophthalmic suspension Place 1 drop into the right eye 4 (four) times daily.   No current facility-administered medications for this visit.  (Ophthalmic Drugs)   Current Outpatient Medications (Other)  Medication Sig  . alendronate (FOSAMAX) 70 MG tablet Take 70 mg by mouth once a week. Take with a full glass of water on an empty stomach.  . Cholecalciferol (VITAMIN D) 2000 units CAPS Take 2,000 Units by mouth daily.  Marland Kitchen escitalopram (LEXAPRO) 5 MG tablet Take 5 mg by mouth daily.   Marland Kitchen FIBER PO Take 1 tablet by mouth 3 (three) times daily.   . fluticasone (FLONASE) 50 MCG/ACT nasal spray Place 2 sprays into both nostrils 2 (two) times daily.   . furosemide (LASIX) 40 MG tablet Take 1.5 tablets (60mg  total) by mouth every morning.  Take 1 tablet (40mg  total) by mouth every afternoon.  Marland Kitchen guaiFENesin (MUCINEX) 600 MG 12 hr tablet Take 600 mg by mouth at bedtime as needed for to loosen phlegm.   Marland Kitchen HYDROcodone-acetaminophen (NORCO) 10-325 MG tablet Take 1 tablet by mouth every 6 (six) hours as needed (for pain).  . metoprolol tartrate (LOPRESSOR) 25 MG tablet Take 25 mg by mouth 2 (two) times daily.  . naloxegol oxalate (  MOVANTIK) 12.5 MG TABS tablet Take 12.5 mg by mouth daily.  . nitroGLYCERIN (NITRODUR - DOSED IN MG/24 HR) 0.2 mg/hr patch Apply one (1) patch to the left foot every other day.  Marland Kitchen OVER THE COUNTER MEDICATION Take 600 mg by mouth daily.  . pantoprazole (PROTONIX) 40 MG tablet Take 1 tablet (40 mg total) by mouth daily.  . polyethylene glycol (MIRALAX / GLYCOLAX) packet Take 17 g by mouth daily as needed for moderate constipation. For constipation (Patient taking differently: Take 17 g by mouth daily as needed for mild constipation or moderate constipation. )  . potassium chloride SA (K-DUR,KLOR-CON) 20 MEQ tablet Take 1 tablet (20 mEq total) by mouth daily. (Patient taking differently: Take 20 mEq by mouth daily with lunch. )  . predniSONE  (DELTASONE) 10 MG tablet Take 2 tablets (20 mg total) by mouth daily with breakfast.  . predniSONE (DELTASONE) 5 MG tablet TAKE 1 AND 1/2 TABLETS(7.5 MG) BY MOUTH DAILY WITH BREAKFAST  . spironolactone (ALDACTONE) 25 MG tablet Take 25 mg by mouth daily.  . vitamin B-12 (CYANOCOBALAMIN) 1000 MCG tablet Take 1,000 mcg by mouth daily with lunch.   . warfarin (COUMADIN) 5 MG tablet TAKE AS DIRECTED BY COUMADIN CLINIC   Current Facility-Administered Medications (Other)  Medication Route  . Bevacizumab (AVASTIN) SOLN 1.25 mg Intravitreal  . Bevacizumab (AVASTIN) SOLN 1.25 mg Intravitreal  . Bevacizumab (AVASTIN) SOLN 1.25 mg Intravitreal      REVIEW OF SYSTEMS: ROS    Positive for: Musculoskeletal, Cardiovascular, Eyes   Negative for: Constitutional, Gastrointestinal, Neurological, Skin, Genitourinary, HENT, Endocrine, Respiratory, Psychiatric, Allergic/Imm, Heme/Lymph   Last edited by Debbrah Alar, COT on 12/17/2017  1:02 PM. (History)       ALLERGIES Allergies  Allergen Reactions  . Tape Other (See Comments)    PATIENT'S SKIN IS VERY THIN AND WILL TEAR AND BRUISE VERY EASILY!! Paper take is ok    PAST MEDICAL HISTORY Past Medical History:  Diagnosis Date  . Atrial fibrillation ([place])   . Cellulitis    left arm  . CHF (congestive heart failure) ([place])   . Dyspnea    oxygen prn  . Dysrhythmia    afib  . GERD (gastroesophageal reflux disease)    ocassional  . History of kidney stones    passed  . Myasthenia gravis   . Osteoarthritis   . Pneumonia    Hstory of  . 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 Prostate cancer ([place]) dx'd 1982   treated with surgery and radiation.  Basal cell cancer- many  . Sepsis ([place])   . Urine incontinence   . Varicose veins of left lower extremity    Past Surgical History:  Procedure Laterality Date  . AMPUTATION Left 03/20/2016   Procedure: Left Great Toe Amputation at Metatarsophalangeal Joint;  Surgeon: Newt Minion,  MD;  Location: [facility];  Service: Orthopedics;  Laterality: Left;  . AMPUTATION Left 06/11/2016   Procedure: Left 2nd Toe Amputation;  Surgeon: Newt Minion, MD;  Location: [facility];  Service: Orthopedics;  Laterality: Left;  . AMPUTATION Left 07/12/2016   Procedure: LEFT 3rd TOE AMPUTATION;  Surgeon: Newt Minion, MD;  Location: [facility];  Service: Orthopedics;  Laterality: Left;  . AMPUTATION Left 09/11/2016   Procedure: Left Transmetatarsal Amputation;  Surgeon: Newt Minion, MD;  Location: [facility];  Service: Orthopedics;  Laterality: Left;  . appendectomy    . APPENDECTOMY    . COLONOSCOPY    . HERNIA REPAIR  Bilateral    Inguinal  . I&D EXTREMITY Left 10/12/2014   Procedure: IRRIGATION AND DEBRIDEMENT INDEX FINGER;  Surgeon: Iran Planas, MD;  Location: [facility];  Service: Orthopedics;  Laterality: Left;  . KYPHOPLASTY  2012 ?   T 12- L1  . LAMINECTOMY  2005 ?   lumbar- 4-5  . PROSTATE SURGERY    . ROTATOR CUFF REPAIR Right   . STUMP REVISION Left 12/11/2016   Procedure: Revision Left Transmetatarsal Amputation, Apply Wound VAC;  Surgeon: Newt Minion, MD;  Location: [facility];  Service: Orthopedics;  Laterality: Left;  . TOTAL KNEE ARTHROPLASTY Right     FAMILY HISTORY Family History  Problem Relation Age of Onset  . Heart disease Father   . Heart attack Father   . Hypertension Father   . Heart disease Brother   . Heart disease Paternal Grandfather   . Stroke Mother   . Parkinsonism Sister   . Heart disease Sister   . Dementia Sister     SOCIAL HISTORY Social History   Tobacco Use  . Smoking status: Never Smoker  . Smokeless tobacco: Never Used  Substance Use Topics  . Alcohol use: Yes    Alcohol/week: 1.2 oz    Types: 2 Cans of beer per week    Comment: occasional beer  . Drug use: No         OPHTHALMIC EXAM:  Base Eye Exam    Visual Acuity (Snellen - Linear)      Right Left   Dist [place] 20/200 -2 20/150 -1   Dist ph [place] 20/150 +2 20/50       Tonometry (Tonopen,  1:10 PM)      Right Left   Pressure 13 10       Pupils      Dark Light Shape React APD   Right 2 2 Round Minimal Trace   Left 2 2 Round Minimal Trace       Visual Fields      Left Right    Full Full       Extraocular Movement      Right Left    Full, Ortho Full, Ortho       Neuro/Psych    Oriented x3:  Yes   Mood/Affect:  Normal       Dilation    Both eyes:  1.0% Mydriacyl, 2.5% Phenylephrine @ 1:10 PM        Slit Lamp and Fundus Exam    Slit Lamp Exam      Right Left   Lids/Lashes Ptosis UL, Meibomian gland dysfunction, Telangiectasia Ptosis UL, Meibomian gland dysfunction, Telangiectasia   Conjunctiva/Sclera White and quiet White and quiet   Cornea Arcus Arcus   Anterior Chamber Deep and quiet Deep and quiet   Iris Round and dilated Round and dilated   Lens Three piece PC IOL in good position Three piece PC IOL in good position   Vitreous Vitreous syneresis, Posterior vitreous detachment Vitreous syneresis, Posterior vitreous detachment       Fundus Exam      Right Left   Disc 2+ Pallor, Hemorrhage at 1200, inf rim thinning Normal, pink rim   C/D Ratio 0.85 0.5   Macula Blunted foveal reflex, Microaneurysms/IRH, Cystic changes, -- improved Retinal pigment epithelial mottling, No heme or edema, Choroidal nevus along inferior arcade, Flat   Vessels Vascular attenuation Vascular attenuation   Periphery Attached, scattered 360 DBH mostly inferiorly  Attached  IMAGING AND PROCEDURES  Imaging and Procedures for 11/03/17  OCT, Retina - OU - Both Eyes       Right Eye Quality was borderline. Central Foveal Thickness: 278. Progression has improved. Findings include intraretinal fluid, subretinal fluid, normal foveal contour (Sub-fovea disruption of ellipsoid--interval improvement in IRF).   Left Eye Quality was good. Central Foveal Thickness: 263. Progression has been stable. Findings include normal foveal contour, no IRF, no SRF.   Notes *Images  captured and stored on drive  Diagnosis / Impression:  OD: persistent CME - interval improvement of CME  OS: NFP, No IRF/SRF  Clinical management:  See below  Abbreviations: NFP - Normal foveal profile. CME - cystoid macular edema. PED - pigment epithelial detachment. IRF - intraretinal fluid. SRF - subretinal fluid. EZ - ellipsoid zone. ERM - epiretinal membrane. ORA - outer retinal atrophy. ORT - outer retinal tubulation. SRHM - subretinal hyper-reflective material         Intravitreal Injection, Pharmacologic Agent - OD - Right Eye       Time Out 12/17/2017. 2:10 PM. Confirmed correct patient, procedure, site, and patient consented.   Anesthesia Topical anesthesia was used. Anesthetic medications included Lidocaine 2%, Proparacaine 0.5%.   Procedure Preparation included 5% betadine to ocular surface, eyelid speculum. A supplied needle was used.   Injection: 1.25 mg Bevacizumab 1.25mg /0.63ml   NDC: 92426-834-19    Lot: [id number]    Expiration Date: 02/18/2018   Route: Intravitreal   Site: Right Eye   Waste: 0 mg  Post-op Post injection exam found visual acuity of at least counting fingers. The patient tolerated the procedure well. There were no complications. The patient received written and verbal post procedure care education.                 ASSESSMENT/PLAN:    ICD-10-CM   1. Branch retinal vein occlusion of right eye with macular edema H34.8310 Intravitreal Injection, Pharmacologic Agent - OD - Right Eye    Bevacizumab (AVASTIN) SOLN 1.25 mg  2. Retinal edema H35.81 OCT, Retina - OU - Both Eyes  3. Hypertensive retinopathy of both eyes H35.033   4. 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 Posterior vitreous detachment of both eyes H43.813   6. Pseudophakia of both eyes Z96.1   7. CME (cystoid macular edema), right H35.351     1,2. BRVO w/ macular edema OD - FA from initial visit (2.19.19) shows slightly delayed filling time OD; late leakage corresponding to  CME, and inferior hemispheric Mas -- suggestive of mild/remote HRVO - BCVA also dropped from 20/80 to 20/100 - S/P IVA OD #1 (03.11.19), #2 (04.08.19), #3 (05.22.19) - OCT with mild interval improvement of CME, but still persists - BCVA down today at 20/150 OD - recommend IVA OD #3 today (05.22.19) - pt wishes to proceed - RBA of procedure discussed, questions answered - informed consent obtained and signed - see procedure note - pt tolerated procedure well - Vision and visual potential OD complicated by #4 below - F/U 4-5 weeks for repeat DFE/OCT, possible injection  3. Hypertensive retinopathy OU - discussed importance of tight BP control - monitor  4. Optic neuropathy OD - stable - significant cupping and pallor of disc OD - +rAPD OD - suspect glaucomatous damage vs ischemic optic neuropathy - MRI recommended by Dr. Katy Fitch, but pt has deferred - monitor  5. PVD / vitreous syneresis OU  Discussed findings and prognosis  No RT or RD on 360 peripheral exam  Reviewed  s/s of RT/RD  Strict return precautions for any such RT/RD signs/symptoms  6. Pseudophakia OU  - s/p CE/IOL  - beautiful surgery, doing well  - monitor    Ophthalmic Meds Ordered this visit:  Meds ordered this encounter  Medications  . Bevacizumab (AVASTIN) SOLN 1.25 mg       Return in about 1 month (around 01/14/2018) for DFE/OCT, possible injection.  There are no Patient Instructions on file for this visit.   Explained the diagnoses, plan, and follow up with the patient and they expressed understanding.  Patient expressed understanding of the importance of proper follow up care.   This document serves as a record of services personally performed by Gardiner Sleeper, MD, PhD. It was created on their behalf by Catha Brow, [place], a certified ophthalmic assistant. The creation of this record is the provider's dictation and/or activities during the visit.  Electronically signed by: Catha Brow, COA   05.21.19 1:08 AM   This document serves as a record of services personally performed by Gardiner Sleeper, MD, PhD. It was created on their behalf by Ernest Mallick, OA, an ophthalmic assistant. The creation of this record is the provider's dictation and/or activities during the visit.    Electronically signed by: Ernest Mallick, OA  05.22.2019 1:08 AM      Gardiner Sleeper, M.D., Ph.D. Diseases & Surgery of the Retina and [place] 12/16/17  I have reviewed the above documentation for accuracy and completeness, and I agree with the above. Gardiner Sleeper, M.D., Ph.D. 12/21/17 11:26 AM     Abbreviations: M myopia (nearsighted); A astigmatism; H hyperopia (farsighted); P presbyopia; Mrx spectacle prescription;  CTL contact lenses; OD right eye; OS left eye; OU both eyes  XT exotropia; ET esotropia; PEK punctate epithelial keratitis; PEE punctate epithelial erosions; DES dry eye syndrome; MGD meibomian gland dysfunction; ATs artificial tears; PFAT's preservative free artificial tears; [place] nuclear sclerotic cataract; PSC posterior subcapsular cataract; ERM epi-retinal membrane; PVD posterior vitreous detachment; RD retinal detachment; DM diabetes mellitus; DR diabetic retinopathy; NPDR non-proliferative diabetic retinopathy; PDR proliferative diabetic retinopathy; CSME clinically significant macular edema; DME diabetic macular edema; dbh dot blot hemorrhages; CWS cotton wool spot; POAG primary open angle glaucoma; C/D cup-to-disc ratio; HVF humphrey visual field; GVF goldmann visual field; OCT optical coherence tomography; IOP intraocular pressure; BRVO Branch retinal vein occlusion; CRVO central retinal vein occlusion; CRAO central retinal artery occlusion; BRAO branch retinal artery occlusion; RT retinal tear; SB scleral buckle; PPV pars plana vitrectomy; VH Vitreous hemorrhage; PRP panretinal laser photocoagulation; IVK intravitreal kenalog; VMT vitreomacular traction;  MH Macular hole;  NVD neovascularization of the disc; NVE neovascularization elsewhere; AREDS age related eye disease study; ARMD age related macular degeneration; POAG primary open angle glaucoma; EBMD epithelial/anterior basement membrane dystrophy; ACIOL anterior chamber intraocular lens; IOL intraocular lens; PCIOL posterior chamber intraocular lens; Phaco/IOL phacoemulsification with intraocular lens placement; [place] photorefractive keratectomy; LASIK laser assisted in situ keratomileusis; HTN hypertension; DM diabetes mellitus; COPD chronic obstructive pulmonary disease

## 2017-12-17 ENCOUNTER — Encounter (INDEPENDENT_AMBULATORY_CARE_PROVIDER_SITE_OTHER): Payer: Self-pay | Admitting: Ophthalmology

## 2017-12-17 ENCOUNTER — Ambulatory Visit (INDEPENDENT_AMBULATORY_CARE_PROVIDER_SITE_OTHER): Payer: Medicare Other | Admitting: Ophthalmology

## 2017-12-17 DIAGNOSIS — H35033 Hypertensive retinopathy, bilateral: Secondary | ICD-10-CM

## 2017-12-17 DIAGNOSIS — H3581 Retinal edema: Secondary | ICD-10-CM

## 2017-12-17 DIAGNOSIS — H34831 Tributary (branch) retinal vein occlusion, right eye, with macular edema: Secondary | ICD-10-CM | POA: Diagnosis not present

## 2017-12-17 DIAGNOSIS — Z961 Presence of intraocular lens: Secondary | ICD-10-CM

## 2017-12-17 DIAGNOSIS — H469 Unspecified optic neuritis: Secondary | ICD-10-CM

## 2017-12-17 DIAGNOSIS — H43813 Vitreous degeneration, bilateral: Secondary | ICD-10-CM

## 2017-12-21 ENCOUNTER — Encounter (INDEPENDENT_AMBULATORY_CARE_PROVIDER_SITE_OTHER): Payer: Self-pay | Admitting: Ophthalmology

## 2017-12-21 DIAGNOSIS — H34831 Tributary (branch) retinal vein occlusion, right eye, with macular edema: Secondary | ICD-10-CM | POA: Diagnosis not present

## 2017-12-21 MED ORDER — BEVACIZUMAB CHEMO INJECTION 1.25MG/0.05ML SYRINGE FOR KALEIDOSCOPE
1.2500 mg | INTRAVITREAL | Status: AC
  Administered 2017-12-21: 1.25 mg via INTRAVITREAL

## 2017-12-26 ENCOUNTER — Ambulatory Visit: Payer: Medicare Other | Admitting: Physical Medicine & Rehabilitation

## 2017-12-26 ENCOUNTER — Encounter: Payer: Medicare Other | Attending: Physical Medicine & Rehabilitation

## 2017-12-26 ENCOUNTER — Other Ambulatory Visit: Payer: Self-pay

## 2017-12-26 ENCOUNTER — Encounter: Payer: Self-pay | Admitting: Physical Medicine & Rehabilitation

## 2017-12-26 VITALS — BP 108/76 | HR 127 | Ht 70.0 in | Wt 163.0 lb

## 2017-12-26 DIAGNOSIS — M21949 Unspecified acquired deformity of hand, unspecified hand: Secondary | ICD-10-CM | POA: Diagnosis not present

## 2017-12-26 DIAGNOSIS — G7000 Myasthenia gravis without (acute) exacerbation: Secondary | ICD-10-CM | POA: Insufficient documentation

## 2017-12-26 DIAGNOSIS — Z8546 Personal history of malignant neoplasm of prostate: Secondary | ICD-10-CM | POA: Diagnosis not present

## 2017-12-26 DIAGNOSIS — Z87442 Personal history of urinary calculi: Secondary | ICD-10-CM | POA: Diagnosis not present

## 2017-12-26 DIAGNOSIS — Z820 Family history of epilepsy and other diseases of the nervous system: Secondary | ICD-10-CM | POA: Diagnosis not present

## 2017-12-26 DIAGNOSIS — Z85828 Personal history of other malignant neoplasm of skin: Secondary | ICD-10-CM | POA: Insufficient documentation

## 2017-12-26 DIAGNOSIS — M1990 Unspecified osteoarthritis, unspecified site: Secondary | ICD-10-CM | POA: Insufficient documentation

## 2017-12-26 DIAGNOSIS — Z89432 Acquired absence of left foot: Secondary | ICD-10-CM | POA: Insufficient documentation

## 2017-12-26 DIAGNOSIS — M67844 Other specified disorders of tendon, left hand: Secondary | ICD-10-CM | POA: Diagnosis not present

## 2017-12-26 DIAGNOSIS — Z8249 Family history of ischemic heart disease and other diseases of the circulatory system: Secondary | ICD-10-CM | POA: Diagnosis not present

## 2017-12-26 DIAGNOSIS — Z96651 Presence of right artificial knee joint: Secondary | ICD-10-CM | POA: Insufficient documentation

## 2017-12-26 DIAGNOSIS — M21941 Unspecified acquired deformity of hand, right hand: Secondary | ICD-10-CM | POA: Diagnosis not present

## 2017-12-26 DIAGNOSIS — M67843 Other specified disorders of tendon, right hand: Secondary | ICD-10-CM | POA: Insufficient documentation

## 2017-12-26 DIAGNOSIS — Z923 Personal history of irradiation: Secondary | ICD-10-CM | POA: Insufficient documentation

## 2017-12-26 DIAGNOSIS — I509 Heart failure, unspecified: Secondary | ICD-10-CM | POA: Insufficient documentation

## 2017-12-26 DIAGNOSIS — M21942 Unspecified acquired deformity of hand, left hand: Secondary | ICD-10-CM | POA: Diagnosis not present

## 2017-12-26 NOTE — Progress Notes (Signed)
Subjective:    Patient ID: Richard Bradley, male    DOB: 1925/09/23, 84 y.o.   MRN: [id number]  HPI  84 year old male referred by his hand surgeon to evaluate the potential utility of botulinum toxin injections to help correct hand deformities in bilateral upper extremities. Patient was diagnosed with myasthenia gravis about 2 years ago and feels that the deformities started some more thereafter.  He was initially treated in [place].  He was diagnosed with bilateral finger Extensor tendon subluxation.  Surgical intervention was discussed however according to the daughter who is a physical therapist, the surgeon felt that his deformities would likely recur even after fixing the extensor tender slips. He has not tried any hand therapy he has not had any splinting. Daughter has not noted any spasticity in the upper limbs. No hx of CVA, multiple sclerosis or traumatic brain injury   Pain Inventory Average Pain 8 Pain Right Now 0 My pain is intermittent, dull and aching  In the last 24 hours, has pain interfered with the following? General activity 0 Relation with others 0 Enjoyment of life 4 What TIME of day is your pain at its worst? morning evening Sleep (in general) Fair  Pain is worse with: limited finger mobility Pain improves with: none Relief from Meds: NA  Mobility use a walker ability to climb steps?  no do you drive?  no use a wheelchair transfers alone  Function retired  Neuro/Psych bladder control problems spasms confusion  Prior Studies Any changes since last visit?  no  Physicians involved in your care Any changes since last visit?  no   Family History  Problem Relation Age of Onset  . Heart disease Father   . Heart attack Father   . Hypertension Father   . Heart disease Brother   . Heart disease Paternal Grandfather   . Stroke Mother   . Parkinsonism Sister   . Heart disease Sister   . Dementia Sister    Social History    Socioeconomic History  . Marital status: Widowed    Spouse name: Not on file  . Number of children: 2  . Years of education: 89  . Highest education level: Not on file  Occupational History  . Occupation: Retired    Fish farm manager: [place]: [place]  . Financial resource strain: Not on file  . Food insecurity:    Worry: Not on file    Inability: Not on file  . Transportation needs:    Medical: Not on file    Non-medical: Not on file  Tobacco Use  . Smoking status: Never Smoker  . Smokeless tobacco: Never Used  Substance and Sexual Activity  . Alcohol use: Yes    Alcohol/week: 1.2 oz    Types: 2 Cans of beer per week    Comment: occasional beer  . Drug use: No  . Sexual activity: Not on file    Comment: Widowed  Lifestyle  . Physical activity:    Days per week: Not on file    Minutes per session: Not on file  . Stress: Not on file  Relationships  . Social connections:    Talks on phone: Not on file    Gets together: Not on file    Attends religious service: Not on file    Active member of club or organization: Not on file    Attends meetings of clubs or organizations: Not on file  Relationship status: Not on file  Other Topics Concern  . Not on file  Social History Narrative   From [place], moving to [place]   Widowed   Right-handed   Caffeine: 1 cup of coffee per day   Past Surgical History:  Procedure Laterality Date  . AMPUTATION Left 03/20/2016   Procedure: Left Great Toe Amputation at Metatarsophalangeal Joint;  Surgeon: Newt Minion, MD;  Location: [facility];  Service: Orthopedics;  Laterality: Left;  . AMPUTATION Left 06/11/2016   Procedure: Left 2nd Toe Amputation;  Surgeon: Newt Minion, MD;  Location: [facility];  Service: Orthopedics;  Laterality: Left;  . AMPUTATION Left 07/12/2016   Procedure: LEFT 3rd TOE AMPUTATION;  Surgeon: Newt Minion, MD;  Location: [facility];  Service: Orthopedics;  Laterality: Left;   . AMPUTATION Left 09/11/2016   Procedure: Left Transmetatarsal Amputation;  Surgeon: Newt Minion, MD;  Location: [facility];  Service: Orthopedics;  Laterality: Left;  . appendectomy    . APPENDECTOMY    . COLONOSCOPY    . HERNIA REPAIR Bilateral    Inguinal  . I&D EXTREMITY Left 10/12/2014   Procedure: IRRIGATION AND DEBRIDEMENT INDEX FINGER;  Surgeon: Iran Planas, MD;  Location: [facility];  Service: Orthopedics;  Laterality: Left;  . KYPHOPLASTY  2012 ?   T 12- L1  . LAMINECTOMY  2005 ?   lumbar- 4-5  . PROSTATE SURGERY    . ROTATOR CUFF REPAIR Right   . STUMP REVISION Left 12/11/2016   Procedure: Revision Left Transmetatarsal Amputation, Apply Wound VAC;  Surgeon: Newt Minion, MD;  Location: [facility];  Service: Orthopedics;  Laterality: Left;  . TOTAL KNEE ARTHROPLASTY Right    Past Medical History:  Diagnosis Date  . Atrial fibrillation ([place])   . Cellulitis    left arm  . CHF (congestive heart failure) ([place])   . Dyspnea    oxygen prn  . Dysrhythmia    afib  . GERD (gastroesophageal reflux disease)    ocassional  . History of kidney stones    passed  . Myasthenia gravis   . Osteoarthritis   . Pneumonia    Hstory of  . 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 Prostate cancer ([place]) dx'd 1982   treated with surgery and radiation.  Basal cell cancer- many  . Sepsis ([place])   . Urine incontinence   . Varicose veins of left lower extremity    BP 108/76   Pulse (!) 127   Ht 5\' 10"  (1.778 m) Comment: pt reported, in wheelchair  Wt 163 lb (73.9 kg) Comment: pt reported, in wheelchair  SpO2 (!) 88%   BMI 23.39 kg/m   Opioid Risk Score:   Fall Risk Score:  `1  Depression screen PHQ 2/9  Depression screen [facility] 2/9 12/26/2017 01/07/2017 07/01/2016 06/14/2016 06/12/2016 07/20/2015  Decreased Interest 0 0 0 0 0 0  Down, Depressed, Hopeless 0 0 0 0 0 0  PHQ - 2 Score 0 0 0 0 0 0    Review of Systems  Constitutional: Negative.   HENT: Negative.   Eyes: Negative.    Respiratory: Positive for shortness of breath.   Cardiovascular: Negative.   Gastrointestinal: Negative.   Endocrine: Negative.   Genitourinary: Negative.   Musculoskeletal: Negative.   Skin: Negative.   Allergic/Immunologic: Negative.   Neurological: Negative.   Hematological: Negative.   Psychiatric/Behavioral: Negative.   All other systems reviewed and are negative.      Objective:   Physical Exam  Constitutional: He appears well-developed and well-nourished. No distress.  HENT:  Head: Normocephalic and atraumatic.  Eyes: Pupils are equal, round, and reactive to light. EOM are normal.  Skin: He is not diaphoretic.  Wet-to-dry dressing over the right middle finger PIP joint  Psychiatric: He has a normal mood and affect.  Nursing note and vitals reviewed. Hand deformities as follows Right index finger with ulnar deviation crossing the middle finger.  The left index finger with ulnar deviation crossing the middle finger. Left middle finger with ulnar deviation crossing inferior to the ring finger Swan-neck deformities at digits 2 3 and 4 bilaterally Please see photo in media  [place] scale is 0 at bilateral biceps, FDS and FDP as well as FCR and FCU Deep tendon reflexes are 1+ bilateral biceps triceps and brachial radialis Grip strength is 4- bilaterally biceps triceps deltoid are 5  Media Information         Document Information   Photos  Hand deformities   12/26/2017 12:22  Attached To:  Office Visit on 12/26/17 with Kirsteins, Luanna Salk, MD   Source Information   Kirsteins, Luanna Salk, MD  [facility] [place]            Assessment & Plan:  1.  Bilateral hand deformities secondary to extensor tendon slip. There is no history of upper motor neuron lesion and no evidence of spasticity. Botulinum toxin injection would not be helpful in correcting these deformities. Given the patient has myasthenia gravis, botulinum toxin injection is also contraindicated  given risk of precipitating myasthenic crisis. Discussed that splinting may be helpful.  Daughter will discuss at follow-up visit with orthopedic hand surgery.

## 2017-12-26 NOTE — Patient Instructions (Signed)
Cannot do botulinum toxin injections in patients with MG Also no evidence of muscle hyperactivity

## 2017-12-29 ENCOUNTER — Telehealth: Payer: Self-pay | Admitting: Interventional Cardiology

## 2017-12-29 NOTE — Telephone Encounter (Signed)
Information faxed to PCP's office

## 2017-12-29 NOTE — Telephone Encounter (Signed)
Pt's wife calling   Pt is getting his prescription through is family doctor for Lasix Pt wife stated Dr Tamala Julian changed his lasix  he now takes 40mg  at lunch and 60mg  am They need a note to be faxed to his PCP Gwyndolyn Kaufman so he can get the correct dosage for his medication. If any questions please call wife  Attn: Katrina  Fax: [phone]

## 2018-01-05 ENCOUNTER — Telehealth: Payer: Self-pay | Admitting: Interventional Cardiology

## 2018-01-05 ENCOUNTER — Encounter: Payer: Self-pay | Admitting: Interventional Cardiology

## 2018-01-05 NOTE — Telephone Encounter (Signed)
Pt's dtr calling to let us know that Katrina at Dr. Geraldo Docker office states she has not gotten fax regarding Lasix Anderson [place] noted that she faxed 12-29-17. Asking this be re-faxed so he can get his refill. Att: Katrina [phone]

## 2018-01-06 NOTE — Telephone Encounter (Signed)
Orders faxed

## 2018-01-08 ENCOUNTER — Ambulatory Visit (INDEPENDENT_AMBULATORY_CARE_PROVIDER_SITE_OTHER): Payer: Medicare Other | Admitting: Pharmacist

## 2018-01-08 DIAGNOSIS — I482 Chronic atrial fibrillation, unspecified: Secondary | ICD-10-CM

## 2018-01-08 DIAGNOSIS — Z7901 Long term (current) use of anticoagulants: Secondary | ICD-10-CM | POA: Diagnosis not present

## 2018-01-08 DIAGNOSIS — Z5181 Encounter for therapeutic drug level monitoring: Secondary | ICD-10-CM

## 2018-01-08 LAB — POCT INR: INR: 2.2 (ref 2.0–3.0)

## 2018-01-08 NOTE — Patient Instructions (Signed)
Description   Continue on same dose 1/2 tablet daily except 1 tablet on Mondays and Fridays. Call if on any new medications or scheduled for any procedures [phone]. Recheck INR in 6 weeks.       

## 2018-01-14 ENCOUNTER — Encounter

## 2018-01-14 ENCOUNTER — Ambulatory Visit: Payer: Medicare Other | Admitting: Neurology

## 2018-01-14 ENCOUNTER — Encounter: Payer: Self-pay | Admitting: Neurology

## 2018-01-14 VITALS — BP 89/60 | HR 61 | Ht 70.0 in | Wt 162.0 lb

## 2018-01-14 DIAGNOSIS — G7000 Myasthenia gravis without (acute) exacerbation: Secondary | ICD-10-CM

## 2018-01-14 DIAGNOSIS — R1312 Dysphagia, oropharyngeal phase: Secondary | ICD-10-CM | POA: Diagnosis not present

## 2018-01-14 NOTE — Progress Notes (Signed)
Reason for visit: Myasthenia gravis  Richard Bradley is an 84 y.o. male  History of present illness:  Richard Bradley is an 84 year old right-handed white male with a history of myasthenia gravis.  The patient is on prednisone taking 17.5 mg daily, he has done fairly well with this, he denies any troubles with double vision or ptosis, he has significant issues with walking because of his low back problems.  He has a history of compression fractures with kyphoplasty, he has significant pain when he bears weight and he is able to walk only short distances with a walker because of this.  He has had 3 falls since last seen, usually from falling asleep in a chair and slipping out on the floor.  He does not sleep well at night secondary to vivid dreams.  The patient reports some recent issues with dysphagia, he will have difficulty swallowing larger pills and eating solid food.  He indicates that his ability to swallow is worse initially and then as he continues to eat it seems to improve.  The patient returns to this office for an evaluation.  The patient himself reports no problems with breathing.  He lives with his daughter, he has a caretaker that helps him with bathing and dressing.   Past Medical History:  Diagnosis Date  . Atrial fibrillation ([place])   . Cellulitis    left arm  . CHF (congestive heart failure) ([place])   . Dyspnea    oxygen prn  . Dysrhythmia    afib  . GERD (gastroesophageal reflux disease)    ocassional  . History of kidney stones    passed  . Myasthenia gravis   . Osteoarthritis   . Pneumonia    Hstory of  . 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 Prostate cancer ([place]) dx'd 1982   treated with surgery and radiation.  Basal cell cancer- many  . Sepsis ([place])   . Urine incontinence   . Varicose veins of left lower extremity     Past Surgical History:  Procedure Laterality Date  . AMPUTATION Left 03/20/2016   Procedure: Left Great Toe Amputation at  Metatarsophalangeal Joint;  Surgeon: Newt Minion, MD;  Location: [facility];  Service: Orthopedics;  Laterality: Left;  . AMPUTATION Left 06/11/2016   Procedure: Left 2nd Toe Amputation;  Surgeon: Newt Minion, MD;  Location: [facility];  Service: Orthopedics;  Laterality: Left;  . AMPUTATION Left 07/12/2016   Procedure: LEFT 3rd TOE AMPUTATION;  Surgeon: Newt Minion, MD;  Location: [facility];  Service: Orthopedics;  Laterality: Left;  . AMPUTATION Left 09/11/2016   Procedure: Left Transmetatarsal Amputation;  Surgeon: Newt Minion, MD;  Location: [facility];  Service: Orthopedics;  Laterality: Left;  . appendectomy    . APPENDECTOMY    . COLONOSCOPY    . HERNIA REPAIR Bilateral    Inguinal  . I&D EXTREMITY Left 10/12/2014   Procedure: IRRIGATION AND DEBRIDEMENT INDEX FINGER;  Surgeon: Iran Planas, MD;  Location: [facility];  Service: Orthopedics;  Laterality: Left;  . KYPHOPLASTY  2012 ?   T 12- L1  . LAMINECTOMY  2005 ?   lumbar- 4-5  . PROSTATE SURGERY    . ROTATOR CUFF REPAIR Right   . STUMP REVISION Left 12/11/2016   Procedure: Revision Left Transmetatarsal Amputation, Apply Wound VAC;  Surgeon: Newt Minion, MD;  Location: [facility];  Service: Orthopedics;  Laterality: Left;  . TOTAL KNEE ARTHROPLASTY Right     Family  History  Problem Relation Age of Onset  . Heart disease Father   . Heart attack Father   . Hypertension Father   . Heart disease Brother   . Heart disease Paternal Grandfather   . Stroke Mother   . Parkinsonism Sister   . Heart disease Sister   . Dementia Sister     Social history:  reports that he has never smoked. He has never used smokeless tobacco. He reports that he drinks about 1.2 oz of alcohol per week. He reports that he does not use drugs.    Allergies  Allergen Reactions  . Tape Other (See Comments)    PATIENT'S SKIN IS VERY THIN AND WILL TEAR AND BRUISE VERY EASILY!! Paper take is ok    Medications:  Prior to Admission medications   Medication Sig Start Date  End Date Taking? Authorizing Provider  alendronate (FOSAMAX) 70 MG tablet Take 70 mg by mouth once a week. Take with a full glass of water on an empty stomach.   Yes [provider]  ALPHAGAN P 0.1 % SOLN INT 1 GTT IN OU BID 09/26/17  Yes [provider]  Cholecalciferol (VITAMIN D) 2000 units CAPS Take 2,000 Units by mouth daily.   Yes [provider]  doxycycline (VIBRAMYCIN) 100 MG capsule Take 1 capsule by mouth 2 (two) times daily. Will be finished on 01-15-18 01/06/18  Yes [provider]  escitalopram (LEXAPRO) 5 MG tablet Take 5 mg by mouth daily.  09/15/16  Yes [provider]  FIBER PO Take 1 tablet by mouth 3 (three) times daily.    Yes [provider]  fluticasone (FLONASE) 50 MCG/ACT nasal spray Place 2 sprays into both nostrils 2 (two) times daily.    Yes [provider]  furosemide (LASIX) 40 MG tablet Take 1.5 tablets (60mg  total) by mouth every morning.  Take 1 tablet (40mg  total) by mouth every afternoon. 12/04/17  Yes Belva Crome, MD  guaiFENesin (MUCINEX) 600 MG 12 hr tablet Take 600 mg by mouth at bedtime as needed for to loosen phlegm.    Yes [provider]  HYDROcodone-acetaminophen (NORCO) 10-325 MG tablet Take 1 tablet by mouth every 6 (six) hours as needed (for pain). Patient taking differently: Take 0.5 tablets by mouth every 6 (six) hours as needed (for pain).  12/16/16  Yes Dhungel, Nishant, MD  ketorolac (ACULAR) 0.5 % ophthalmic solution Place 1 drop into the right eye 4 (four) times daily. 09/16/17  Yes Bernarda Caffey, MD  metoprolol tartrate (LOPRESSOR) 25 MG tablet Take 25 mg by mouth 2 (two) times daily. 01/07/17  Yes [provider]  naloxegol oxalate (MOVANTIK) 12.5 MG TABS tablet Take 12.5 mg by mouth daily.   Yes [provider]  OVER THE COUNTER MEDICATION Take 600 mg by mouth daily.   Yes [provider]  pantoprazole (PROTONIX) 40 MG tablet Take 1 tablet (40 mg total)  by mouth daily. 10/02/16  Yes Rai, Ripudeep K, MD  polyethylene glycol (MIRALAX / GLYCOLAX) packet Take 17 g by mouth daily as needed for moderate constipation. For constipation 10/02/16  Yes Rai, Ripudeep K, MD  potassium chloride SA (K-DUR,KLOR-CON) 20 MEQ tablet Take 1 tablet (20 mEq total) by mouth daily. 10/02/16  Yes Rai, Ripudeep K, MD  prednisoLONE acetate (PRED FORTE) 1 % ophthalmic suspension Place 1 drop into the right eye 4 (four) times daily. 09/16/17  Yes Bernarda Caffey, MD  predniSONE (DELTASONE) 10 MG tablet Take 10 mg by  mouth daily with breakfast.  04/18/17  Yes Short, Noah Delaine, MD  predniSONE (DELTASONE) 5 MG tablet TAKE 1 AND 1/2 TABLETS(7.5 MG) BY MOUTH DAILY WITH BREAKFAST 11/17/17  Yes Kathrynn Ducking, MD  spironolactone (ALDACTONE) 25 MG tablet Take 25 mg by mouth daily.   Yes [provider]  vitamin B-12 (CYANOCOBALAMIN) 1000 MCG tablet Take 1,000 mcg by mouth daily with lunch.    Yes [provider]  warfarin (COUMADIN) 5 MG tablet TAKE AS DIRECTED BY COUMADIN CLINIC 09/22/17  Yes Belva Crome, MD    ROS:  Out of a complete 14 system review of symptoms, the patient complains only of the following symptoms, and all other reviewed systems are negative.  Difficulty swallowing Shortness of breath Frequent waking, daytime sleepiness Incontinence of the bladder Joint pain, back pain, walking difficulty  Blood pressure (!) 89/60, pulse 61, height 5\' 10"  (1.778 m), weight 162 lb (73.5 kg).  Physical Exam  General: The patient is alert and cooperative at the time of the examination.  Skin: No significant peripheral edema is noted on the right ankle, 1+ on the left.   Neurologic Exam  Mental status: The patient is alert and oriented x 3 at the time of the examination. The patient has apparent normal recent and remote memory, with an apparently normal attention span and concentration ability.   Cranial nerves: Facial symmetry is present. Speech is  normal, no aphasia or dysarthria is noted. Extraocular movements are full. Visual fields are full.  With superior gaze for 1 minute, no divergence of gaze or subjective double vision was noted.  No ptosis was seen.  Motor: The patient has good strength in all 4 extremities.  With arms outstretched for 1 minute, no fatigable weakness of the deltoid muscles was seen.  Sensory examination: Soft touch sensation is symmetric on the face, arms, and legs.  Coordination: The patient has good finger-nose-finger and heel-to-shin bilaterally.  Gait and station: The patient was not ambulated, he is able to stand with assistance.  Reflexes: Deep tendon reflexes are symmetric.   Assessment/Plan:  1.  Myasthenia gravis  2.  Gait disorder  3.  Chronic low back pain  4.  Reports of dysphagia  The patient will be sent for a modified barium swallow to evaluate the reports of dysphasia.  I suspect that this is not related to the myasthenia gravis, the patient reports that he has more difficulty early on in the meal, he does better later.  He does seem to want to clear his throat frequently as he feels that food or medication is caught in his throat.  He will follow-up in 6 months.  If the modified barium swallow is unremarkable, I would like to continue to reduce the prednisone dose.  Jill Alexanders MD 01/14/2018 8:13 AM  [facility] [address] [place], [place] [address]  Phone [phone] Fax [phone]

## 2018-01-14 NOTE — Patient Instructions (Signed)
   We will get MBS.

## 2018-01-15 NOTE — Progress Notes (Signed)
[facility] Note  01/19/2018     CHIEF COMPLAINT Patient presents for Retina Follow Up   HISTORY OF PRESENT ILLNESS: Richard Bradley is a 84 y.o. male who presents to the clinic today for:   HPI    Retina Follow Up    Patient presents with  CRVO/BRVO.  In right eye.  This started 3 months ago.  Severity is mild.  Since onset it is stable.  I, the attending physician,  performed the HPI with the patient and updated documentation appropriately.          Comments    F/U CRVO/BRVO, Patient states his vision is about the same, "I can not see when I wake up, vision returns after being up a little while". Pt is using gtt's as instructed. Pt states he rather not have tx today,but if indicted he will.       Last edited by Bernarda Caffey, MD on 01/19/2018  2:21 PM. (History)      Referring physician: Josetta Huddle, MD [place] [place], [place] [address]  HISTORICAL INFORMATION:   Selected notes from the MEDICAL RECORD NUMBER Referred by Dr. Elliot Dally for AMD/CME OD;  LEE- 02.13.19 [BCVA OD: 20/70 OS: 20/20] Ocular Hx- pseudophakia OU (1989), POAG OU PMH- myasthenia gravis, hx prostate ca, CHF, A-Fib, COPD    CURRENT MEDICATIONS: Current Outpatient Medications (Ophthalmic Drugs)  Medication Sig  . ALPHAGAN P 0.1 % SOLN INT 1 GTT IN OU BID  . ketorolac (ACULAR) 0.5 % ophthalmic solution Place 1 drop into the right eye 4 (four) times daily.  . prednisoLONE acetate (PRED FORTE) 1 % ophthalmic suspension Place 1 drop into the right eye 4 (four) times daily.   No current facility-administered medications for this visit.  (Ophthalmic Drugs)   Current Outpatient Medications (Other)  Medication Sig  . alendronate (FOSAMAX) 70 MG tablet Take 70 mg by mouth once a week. Take with a full glass of water on an empty stomach.  . Cholecalciferol (VITAMIN D) 2000 units CAPS Take 2,000 Units by mouth daily.  Marland Kitchen escitalopram (LEXAPRO) 5 MG tablet Take 5  mg by mouth daily.   Marland Kitchen FIBER PO Take 1 tablet by mouth 3 (three) times daily.   . fluticasone (FLONASE) 50 MCG/ACT nasal spray Place 2 sprays into both nostrils 2 (two) times daily.   . furosemide (LASIX) 40 MG tablet Take 1.5 tablets (60mg  total) by mouth every morning.  Take 1 tablet (40mg  total) by mouth every afternoon.  Marland Kitchen guaiFENesin (MUCINEX) 600 MG 12 hr tablet Take 600 mg by mouth at bedtime as needed for to loosen phlegm.   Marland Kitchen HYDROcodone-acetaminophen (NORCO) 10-325 MG tablet Take 1 tablet by mouth every 6 (six) hours as needed (for pain). (Patient taking differently: Take 0.5 tablets by mouth every 6 (six) hours as needed (for pain). )  . metoprolol tartrate (LOPRESSOR) 25 MG tablet Take 25 mg by mouth 2 (two) times daily.  . naloxegol oxalate (MOVANTIK) 12.5 MG TABS tablet Take 12.5 mg by mouth daily.  Marland Kitchen OVER THE COUNTER MEDICATION Take 600 mg by mouth daily.  . pantoprazole (PROTONIX) 40 MG tablet Take 1 tablet (40 mg total) by mouth daily.  . polyethylene glycol (MIRALAX / GLYCOLAX) packet Take 17 g by mouth daily as needed for moderate constipation. For constipation  . potassium chloride SA (K-DUR,KLOR-CON) 20 MEQ tablet Take 1 tablet (20 mEq total) by mouth daily.  . predniSONE (DELTASONE) 10  MG tablet Take 10 mg by mouth daily with breakfast.   . predniSONE (DELTASONE) 5 MG tablet TAKE 1 AND 1/2 TABLETS(7.5 MG) BY MOUTH DAILY WITH BREAKFAST  . spironolactone (ALDACTONE) 25 MG tablet Take 25 mg by mouth daily.  . vitamin B-12 (CYANOCOBALAMIN) 1000 MCG tablet Take 1,000 mcg by mouth daily with lunch.   . warfarin (COUMADIN) 5 MG tablet TAKE AS DIRECTED BY COUMADIN CLINIC  . doxycycline (VIBRAMYCIN) 100 MG capsule Take 1 capsule by mouth 2 (two) times daily. Will be finished on 01-15-18   Current Facility-Administered Medications (Other)  Medication Route  . Bevacizumab (AVASTIN) SOLN 1.25 mg Intravitreal  . Bevacizumab (AVASTIN) SOLN 1.25 mg Intravitreal  . Bevacizumab (AVASTIN)  SOLN 1.25 mg Intravitreal  . Bevacizumab (AVASTIN) SOLN 1.25 mg Intravitreal      REVIEW OF SYSTEMS: ROS    Positive for: Musculoskeletal, Eyes   Negative for: Constitutional, Gastrointestinal, Neurological, Skin, Genitourinary, HENT, Endocrine, Cardiovascular, Respiratory, Psychiatric, Allergic/Imm, Heme/Lymph   Last edited by Zenovia Jordan, LPN on 1/44/8185  6:31 PM. (History)       ALLERGIES Allergies  Allergen Reactions  . Tape Other (See Comments)    PATIENT'S SKIN IS VERY THIN AND WILL TEAR AND BRUISE VERY EASILY!! Paper take is ok    PAST MEDICAL HISTORY Past Medical History:  Diagnosis Date  . Atrial fibrillation ([place])   . Cellulitis    left arm  . CHF (congestive heart failure) ([place])   . Dyspnea    oxygen prn  . Dysrhythmia    afib  . GERD (gastroesophageal reflux disease)    ocassional  . History of kidney stones    passed  . Myasthenia gravis   . Osteoarthritis   . Pneumonia    Hstory of  . 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 Prostate cancer ([facility]) dx'd 1982   treated with surgery and radiation.  Basal cell cancer- many  . Sepsis ([place])   . Urine incontinence   . Varicose veins of left lower extremity    Past Surgical History:  Procedure Laterality Date  . AMPUTATION Left 03/20/2016   Procedure: Left Great Toe Amputation at Metatarsophalangeal Joint;  Surgeon: Newt Minion, MD;  Location: [facility];  Service: Orthopedics;  Laterality: Left;  . AMPUTATION Left 06/11/2016   Procedure: Left 2nd Toe Amputation;  Surgeon: Newt Minion, MD;  Location: [facility];  Service: Orthopedics;  Laterality: Left;  . AMPUTATION Left 07/12/2016   Procedure: LEFT 3rd TOE AMPUTATION;  Surgeon: Newt Minion, MD;  Location: [facility];  Service: Orthopedics;  Laterality: Left;  . AMPUTATION Left 09/11/2016   Procedure: Left Transmetatarsal Amputation;  Surgeon: Newt Minion, MD;  Location: [facility];  Service: Orthopedics;  Laterality: Left;  . appendectomy    .  APPENDECTOMY    . COLONOSCOPY    . HERNIA REPAIR Bilateral    Inguinal  . I&D EXTREMITY Left 10/12/2014   Procedure: IRRIGATION AND DEBRIDEMENT INDEX FINGER;  Surgeon: Iran Planas, MD;  Location: [facility];  Service: Orthopedics;  Laterality: Left;  . KYPHOPLASTY  2012 ?   T 12- L1  . LAMINECTOMY  2005 ?   lumbar- 4-5  . PROSTATE SURGERY    . ROTATOR CUFF REPAIR Right   . STUMP REVISION Left 12/11/2016   Procedure: Revision Left Transmetatarsal Amputation, Apply Wound VAC;  Surgeon: Newt Minion, MD;  Location: [facility];  Service: Orthopedics;  Laterality: Left;  . TOTAL KNEE ARTHROPLASTY Right  FAMILY HISTORY Family History  Problem Relation Age of Onset  . Heart disease Father   . Heart attack Father   . Hypertension Father   . Heart disease Brother   . Heart disease Paternal Grandfather   . Stroke Mother   . Parkinsonism Sister   . Heart disease Sister   . Dementia Sister     SOCIAL HISTORY Social History   Tobacco Use  . Smoking status: Never Smoker  . Smokeless tobacco: Never Used  Substance Use Topics  . Alcohol use: Yes    Alcohol/week: 1.2 oz    Types: 2 Cans of beer per week    Comment: occasional beer  . Drug use: No         OPHTHALMIC EXAM:  Base Eye Exam    Visual Acuity (Snellen - Linear)      Right Left   Dist [place] 20/150 +2 20/80 +2   Dist ph [place] 20/80 20/40       Tonometry (Tonopen, 1:31 PM)      Right Left   Pressure 13 15       Pupils      Pupils Dark Light Shape React APD   Right PERRL 2 2 Round Minimal Trace   Left PERRL 2 2 Round Minimal Trace       Visual Fields (Counting fingers)      Left Right    Full Full       Extraocular Movement      Right Left    Full, Ortho Full, Ortho       Neuro/Psych    Oriented x3:  Yes   Mood/Affect:  Normal       Dilation    Both eyes:  1.0% Mydriacyl, Paremyd @ 1:31 PM        Slit Lamp and Fundus Exam    Slit Lamp Exam      Right Left   Lids/Lashes Ptosis UL, Meibomian gland  dysfunction, Telangiectasia Ptosis UL, Meibomian gland dysfunction, Telangiectasia   Conjunctiva/Sclera White and quiet White and quiet   Cornea Arcus Arcus   Anterior Chamber Deep and quiet Deep and quiet   Iris Round and dilated Round and dilated   Lens Three piece PC IOL in good position Three piece PC IOL in good position   Vitreous Vitreous syneresis, Posterior vitreous detachment Vitreous syneresis, Posterior vitreous detachment       Fundus Exam      Right Left   Disc 2+ Pallor, Hemorrhage at 1200, inf rim thinning Normal, pink rim   C/D Ratio 0.85 0.4   Macula Blunted foveal reflex, Microaneurysms, persistent IRH, Cystic changes, -- stable Retinal pigment epithelial mottling, No heme or edema, Choroidal nevus along inferior arcade, Flat   Vessels Vascular attenuation Vascular attenuation   Periphery Attached, scattered 360 DBH mostly inferiorly, scattered MAs Attached, flat / pigmented Choroidal nevus along distal IT arcade          IMAGING AND PROCEDURES  Imaging and Procedures for 11/03/17  OCT, Retina - OU - Both Eyes       Right Eye Quality was borderline. Central Foveal Thickness: 275. Progression has been stable. Findings include intraretinal fluid, normal foveal contour, no SRF (Sub-fovea disruption of ellipsoid-- stable IRF).   Left Eye Quality was borderline. Central Foveal Thickness: 281. Progression has been stable. Findings include normal foveal contour, no IRF, no SRF.   Notes *Images captured and stored on drive  Diagnosis / Impression:  OD: persistent CME  OS: NFP, No IRF/SRF  Clinical management:  See below  Abbreviations: NFP - Normal foveal profile. CME - cystoid macular edema. PED - pigment epithelial detachment. IRF - intraretinal fluid. SRF - subretinal fluid. EZ - ellipsoid zone. ERM - epiretinal membrane. ORA - outer retinal atrophy. ORT - outer retinal tubulation. SRHM - subretinal hyper-reflective material         Intravitreal  Injection, Pharmacologic Agent - OD - Right Eye       Time Out 01/19/2018. 2:31 PM. Confirmed correct patient, procedure, site, and patient consented.   Anesthesia Topical anesthesia was used. Anesthetic medications included Lidocaine 2%, Tetracaine 0.5%.   Procedure Preparation included 5% betadine to ocular surface. A supplied needle was used.   Injection: 1.25 mg Bevacizumab 1.25mg /0.2ml   NDC: 39030-092-33    Lot: [id number]     Expiration Date: 03/12/2018   Route: Intravitreal   Site: Right Eye   Waste: 0 mg  Post-op Post injection exam found visual acuity of at least counting fingers. The patient tolerated the procedure well. There were no complications. The patient received written and verbal post procedure care education.                 ASSESSMENT/PLAN:    ICD-10-CM   1. Branch retinal vein occlusion of right eye with macular edema H34.8310 OCT, Retina - OU - Both Eyes    Intravitreal Injection, Pharmacologic Agent - OD - Right Eye    Bevacizumab (AVASTIN) SOLN 1.25 mg  2. Retinal edema H35.81 OCT, Retina - OU - Both Eyes  3. Hypertensive retinopathy of both eyes H35.033   4. 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 Posterior vitreous detachment of both eyes H43.813   6. Pseudophakia of both eyes Z96.1   7. CME (cystoid macular edema), right H35.351     1,2. BRVO w/ macular edema OD - FA from initial visit (2.19.19) shows slightly delayed filling time OD; late leakage corresponding to CME, and inferior hemispheric Mas -- suggestive of mild/remote HRVO - BCVA also dropped from 20/80 to 20/100 - S/P IVA OD #1 (03.11.19), #2 (04.08.19), #3 (05.22.19), #4 (05.22.19) - OCT with mild interval improvement of CME, but still persists - BCVA improved today at 20/80 OD -- closer to baseline - recommend IVA OD #4 today (06.24.19) - pt wishes to proceed - RBA of procedure discussed, questions answered - informed consent obtained and signed - see procedure note - pt  tolerated procedure well - Vision and visual potential OD complicated by #4 below - F/U 4 weeks for repeat DFE/OCT, possible injection  3. Hypertensive retinopathy OU - discussed importance of tight BP control - monitor  4. Optic neuropathy OD - stable - significant cupping and pallor of disc OD - +rAPD OD - suspect glaucomatous damage vs ischemic optic neuropathy - MRI recommended by Dr. 07.07.19, but pt has deferred - monitor  5. PVD / vitreous syneresis OU  Discussed findings and prognosis  No RT or RD on 360 peripheral exam  Reviewed s/s of RT/RD  Strict return precautions for any such RT/RD signs/symptoms  6. Pseudophakia OU  - s/p CE/IOL  - beautiful surgery, doing well  - monitor    Ophthalmic Meds Ordered this visit:  Meds ordered this encounter  Medications  . Bevacizumab (AVASTIN) SOLN 1.25 mg       Return in about 1 month (around 02/16/2018) for F/U BRVO OD, DFE, OCT.  There are no Patient Instructions on file for this visit.   Explained  the diagnoses, plan, and follow up with the patient and they expressed understanding.  Patient expressed understanding of the importance of proper follow up care.   This document serves as a record of services personally performed by Gardiner Sleeper, MD, PhD. It was created on their behalf by Catha Brow, [place], a certified ophthalmic assistant. The creation of this record is the provider's dictation and/or activities during the visit.  Electronically signed by: Catha Brow, [place]  06.20.19 12:07 AM    Gardiner Sleeper, M.D., Ph.D. Diseases & Surgery of the Retina and Vitreous [facility]  I have reviewed the above documentation for accuracy and completeness, and I agree with the above. Gardiner Sleeper, M.D., Ph.D. 01/21/18 12:09 AM    Abbreviations: M myopia (nearsighted); A astigmatism; H hyperopia (farsighted); P presbyopia; Mrx spectacle prescription;  CTL contact lenses; OD right eye; OS  left eye; OU both eyes  XT exotropia; ET esotropia; PEK punctate epithelial keratitis; PEE punctate epithelial erosions; DES dry eye syndrome; MGD meibomian gland dysfunction; ATs artificial tears; PFAT's preservative free artificial tears; Ferguson nuclear sclerotic cataract; PSC posterior subcapsular cataract; ERM epi-retinal membrane; PVD posterior vitreous detachment; RD retinal detachment; DM diabetes mellitus; DR diabetic retinopathy; NPDR non-proliferative diabetic retinopathy; PDR proliferative diabetic retinopathy; CSME clinically significant macular edema; DME diabetic macular edema; dbh dot blot hemorrhages; CWS cotton wool spot; POAG primary open angle glaucoma; C/D cup-to-disc ratio; HVF humphrey visual field; GVF goldmann visual field; OCT optical coherence tomography; IOP intraocular pressure; BRVO Branch retinal vein occlusion; CRVO central retinal vein occlusion; CRAO central retinal artery occlusion; BRAO branch retinal artery occlusion; RT retinal tear; SB scleral buckle; PPV pars plana vitrectomy; VH Vitreous hemorrhage; PRP panretinal laser photocoagulation; IVK intravitreal kenalog; VMT vitreomacular traction; MH Macular hole;  NVD neovascularization of the disc; NVE neovascularization elsewhere; AREDS age related eye disease study; ARMD age related macular degeneration; POAG primary open angle glaucoma; EBMD epithelial/anterior basement membrane dystrophy; ACIOL anterior chamber intraocular lens; IOL intraocular lens; PCIOL posterior chamber intraocular lens; Phaco/IOL phacoemulsification with intraocular lens placement; Jones photorefractive keratectomy; LASIK laser assisted in situ keratomileusis; HTN hypertension; DM diabetes mellitus; COPD chronic obstructive pulmonary disease

## 2018-01-16 ENCOUNTER — Other Ambulatory Visit (HOSPITAL_COMMUNITY): Payer: Self-pay | Admitting: Neurology

## 2018-01-16 DIAGNOSIS — R1310 Dysphagia, unspecified: Secondary | ICD-10-CM

## 2018-01-19 ENCOUNTER — Ambulatory Visit (INDEPENDENT_AMBULATORY_CARE_PROVIDER_SITE_OTHER): Payer: Medicare Other | Admitting: Ophthalmology

## 2018-01-19 ENCOUNTER — Encounter (INDEPENDENT_AMBULATORY_CARE_PROVIDER_SITE_OTHER): Payer: Self-pay | Admitting: Ophthalmology

## 2018-01-19 DIAGNOSIS — H35033 Hypertensive retinopathy, bilateral: Secondary | ICD-10-CM

## 2018-01-19 DIAGNOSIS — H34831 Tributary (branch) retinal vein occlusion, right eye, with macular edema: Secondary | ICD-10-CM | POA: Diagnosis not present

## 2018-01-19 DIAGNOSIS — H43813 Vitreous degeneration, bilateral: Secondary | ICD-10-CM

## 2018-01-19 DIAGNOSIS — H3581 Retinal edema: Secondary | ICD-10-CM | POA: Diagnosis not present

## 2018-01-19 DIAGNOSIS — Z961 Presence of intraocular lens: Secondary | ICD-10-CM

## 2018-01-19 DIAGNOSIS — H469 Unspecified optic neuritis: Secondary | ICD-10-CM

## 2018-01-20 ENCOUNTER — Ambulatory Visit (HOSPITAL_COMMUNITY)
Admission: RE | Admit: 2018-01-20 | Discharge: 2018-01-20 | Disposition: A | Discharge status: Home / Self Care | Payer: Medicare Other | Source: Ambulatory Visit | Attending: Neurology | Admitting: Neurology

## 2018-01-20 DIAGNOSIS — R1312 Dysphagia, oropharyngeal phase: Secondary | ICD-10-CM | POA: Diagnosis present

## 2018-01-20 DIAGNOSIS — R1310 Dysphagia, unspecified: Secondary | ICD-10-CM | POA: Diagnosis present

## 2018-01-20 DIAGNOSIS — H34831 Tributary (branch) retinal vein occlusion, right eye, with macular edema: Secondary | ICD-10-CM | POA: Diagnosis not present

## 2018-01-20 DIAGNOSIS — G7000 Myasthenia gravis without (acute) exacerbation: Secondary | ICD-10-CM | POA: Insufficient documentation

## 2018-01-20 MED ORDER — BEVACIZUMAB CHEMO INJECTION 1.25MG/0.05ML SYRINGE FOR KALEIDOSCOPE
1.2500 mg | INTRAVITREAL | Status: AC
  Administered 2018-01-20: 1.25 mg via INTRAVITREAL

## 2018-01-20 NOTE — Progress Notes (Signed)
Objective Swallowing Evaluation: Type of Study: MBS-Modified Barium Swallow Study   Patient Details  Name: Richard Bradley MRN: [id number] Date of Birth: 12-16-1925  Today's Date: 01/20/2018 Time: SLP Start Time (ACUTE ONLY): 1130 -SLP Stop Time (ACUTE ONLY): 1150  SLP Time Calculation (min) (ACUTE ONLY): 20 min   Past Medical History:  Past Medical History:  Diagnosis Date  . Atrial fibrillation ([place])   . Cellulitis    left arm  . CHF (congestive heart failure) ([facility])   . Dyspnea    oxygen prn  . Dysrhythmia    afib  . GERD (gastroesophageal reflux disease)    ocassional  . History of kidney stones    passed  . Myasthenia gravis   . Osteoarthritis   . Pneumonia    Hstory of  . 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 Prostate cancer ([place]) dx'd 1982   treated with surgery and radiation.  Basal cell cancer- many  . Sepsis ([place])   . Urine incontinence   . Varicose veins of left lower extremity    Past Surgical History:  Past Surgical History:  Procedure Laterality Date  . AMPUTATION Left 03/20/2016   Procedure: Left Great Toe Amputation at Metatarsophalangeal Joint;  Surgeon: Newt Minion, MD;  Location: [facility];  Service: Orthopedics;  Laterality: Left;  . AMPUTATION Left 06/11/2016   Procedure: Left 2nd Toe Amputation;  Surgeon: Newt Minion, MD;  Location: [facility];  Service: Orthopedics;  Laterality: Left;  . AMPUTATION Left 07/12/2016   Procedure: LEFT 3rd TOE AMPUTATION;  Surgeon: Newt Minion, MD;  Location: [facility];  Service: Orthopedics;  Laterality: Left;  . AMPUTATION Left 09/11/2016   Procedure: Left Transmetatarsal Amputation;  Surgeon: Newt Minion, MD;  Location: [facility];  Service: Orthopedics;  Laterality: Left;  . appendectomy    . APPENDECTOMY    . COLONOSCOPY    . HERNIA REPAIR Bilateral    Inguinal  . I&D EXTREMITY Left 10/12/2014   Procedure: IRRIGATION AND DEBRIDEMENT INDEX FINGER;  Surgeon: Iran Planas, MD;  Location: [facility];  Service:  Orthopedics;  Laterality: Left;  . KYPHOPLASTY  2012 ?   T 12- L1  . LAMINECTOMY  2005 ?   lumbar- 4-5  . PROSTATE SURGERY    . ROTATOR CUFF REPAIR Right   . STUMP REVISION Left 12/11/2016   Procedure: Revision Left Transmetatarsal Amputation, Apply Wound VAC;  Surgeon: Newt Minion, MD;  Location: [facility];  Service: Orthopedics;  Laterality: Left;  . TOTAL KNEE ARTHROPLASTY Right    HPI: 84 yr old (who will be 84 in 3 days) accompanied by his caregiver for an outpatient MBS. He reports difficulty swallowing larger pills with globus sensation. He and caregiver deny coughing during or after liquids or food. PMH: Myasthenia Gravis, GERd and pna     No data recorded   Assessment / Plan / Recommendation  CHL IP CLINICAL IMPRESSIONS 01/20/2018  Clinical Impression Oral phase of swallow was functional with mild difficulty transiting pill with thin barium and effectively propelled with bite of puree. Swallow initiation within normal limits for age with mild vallecular residue primarily following thin. No penetration or aspiration observed. MBS does not diagnose below the level of the UES however esophageal scan showed pill stopping at mid to upper esophagus and transited through with additional sip barium. Recommend continue regular/thin liquids, try pills whole in puree, small sips and esophageal precautions with education provided to pt and caregiver.   SLP Visit Diagnosis Dysphagia,  pharyngeal phase (R13.13)  Attention and concentration deficit following --  Frontal lobe and executive function deficit following --  Impact on safety and function Mild aspiration risk      CHL IP TREATMENT RECOMMENDATION 01/20/2018  Treatment Recommendations No treatment recommended at this time     No flowsheet data found.  CHL IP DIET RECOMMENDATION 01/20/2018  SLP Diet Recommendations Regular solids;Thin liquid  Liquid Administration via Cup;Straw  Medication Administration Whole meds with puree   Compensations Slow rate;Small sips/bites;Minimize environmental distractions  Postural Changes Seated upright at 90 degrees;Remain semi-upright after after feeds/meals (Comment)      CHL IP OTHER RECOMMENDATIONS 01/20/2018  Recommended Consults --  Oral Care Recommendations Oral care BID  Other Recommendations --      CHL IP FOLLOW UP RECOMMENDATIONS 01/20/2018  Follow up Recommendations None      No flowsheet data found.         CHL IP ORAL PHASE 01/20/2018  Oral Phase Impaired  Oral - Pudding Teaspoon --  Oral - Pudding Cup --  Oral - Honey Teaspoon --  Oral - Honey Cup --  Oral - Nectar Teaspoon --  Oral - Nectar Cup --  Oral - Nectar Straw --  Oral - Thin Teaspoon --  Oral - Thin Cup WFL  Oral - Thin Straw WFL  Oral - Puree --  Oral - Mech Soft --  Oral - Regular WFL  Oral - Multi-Consistency --  Oral - Pill Delayed oral transit  Oral Phase - Comment --    CHL IP PHARYNGEAL PHASE 01/20/2018  Pharyngeal Phase Impaired  Pharyngeal- Pudding Teaspoon --  Pharyngeal --  Pharyngeal- Pudding Cup --  Pharyngeal --  Pharyngeal- Honey Teaspoon --  Pharyngeal --  Pharyngeal- Honey Cup --  Pharyngeal --  Pharyngeal- Nectar Teaspoon --  Pharyngeal --  Pharyngeal- Nectar Cup --  Pharyngeal --  Pharyngeal- Nectar Straw --  Pharyngeal --  Pharyngeal- Thin Teaspoon --  Pharyngeal --  Pharyngeal- Thin Cup Pharyngeal residue - valleculae;Reduced epiglottic inversion  Pharyngeal --  Pharyngeal- Thin Straw Pharyngeal residue - valleculae;Reduced epiglottic inversion  Pharyngeal --  Pharyngeal- Puree WFL  Pharyngeal --  Pharyngeal- Mechanical Soft --  Pharyngeal --  Pharyngeal- Regular WFL  Pharyngeal --  Pharyngeal- Multi-consistency --  Pharyngeal --  Pharyngeal- Pill WFL  Pharyngeal --  Pharyngeal Comment --     CHL IP CERVICAL ESOPHAGEAL PHASE 01/20/2018  Cervical Esophageal Phase WFL  Pudding Teaspoon --  Pudding Cup --  Honey Teaspoon --  Honey Cup --   Nectar Teaspoon --  Nectar Cup --  Nectar Straw --  Thin Teaspoon --  Thin Cup --  Thin Straw --  Puree --  Mechanical Soft --  Regular --  Multi-consistency --  Pill --  Cervical Esophageal Comment --    No flowsheet data found.  Houston Siren 01/20/2018, 3:17 PM   Orbie Pyo Colvin Caroli.Ed [employer] [phone]

## 2018-01-21 ENCOUNTER — Encounter (INDEPENDENT_AMBULATORY_CARE_PROVIDER_SITE_OTHER): Payer: Self-pay | Admitting: Ophthalmology

## 2018-02-19 ENCOUNTER — Ambulatory Visit: Payer: Medicare Other | Admitting: *Deleted

## 2018-02-19 DIAGNOSIS — Z5181 Encounter for therapeutic drug level monitoring: Secondary | ICD-10-CM | POA: Diagnosis not present

## 2018-02-19 DIAGNOSIS — I482 Chronic atrial fibrillation, unspecified: Secondary | ICD-10-CM

## 2018-02-19 DIAGNOSIS — Z7901 Long term (current) use of anticoagulants: Secondary | ICD-10-CM | POA: Diagnosis not present

## 2018-02-19 LAB — POCT INR: INR: 2.3 (ref 2.0–3.0)

## 2018-02-19 NOTE — Patient Instructions (Signed)
Description   Continue on same dose 1/2 tablet daily except 1 tablet on Mondays and Fridays. Call if on any new medications or scheduled for any procedures [phone] [phone]. Recheck INR in 6 weeks.

## 2018-02-23 ENCOUNTER — Encounter (INDEPENDENT_AMBULATORY_CARE_PROVIDER_SITE_OTHER): Payer: Medicare Other | Admitting: Ophthalmology

## 2018-02-24 ENCOUNTER — Encounter (INDEPENDENT_AMBULATORY_CARE_PROVIDER_SITE_OTHER): Payer: Medicare Other | Admitting: Ophthalmology

## 2018-02-26 NOTE — Progress Notes (Signed)
[facility] Note  03/02/2018     CHIEF COMPLAINT Patient presents for Retina Follow Up   HISTORY OF PRESENT ILLNESS: Richard Bradley is a 84 y.o. male who presents to the clinic today for:   HPI    Retina Follow Up    Patient presents with  Other.  In right eye.  This started 3 months ago.  Severity is mild.  Since onset it is gradually worsening.  I, the attending physician,  performed the HPI with the patient and updated documentation appropriately.          Comments    F/U BRVO OD. Patient states his vision is "getting worse, everything is blurry, I do not want a injection today ". Pt denies floater,flashes and ocular pain. Pt is using Alphagan gtt's.       Last edited by Bernarda Caffey, MD on 03/02/2018 10:51 AM. (History)      Referring physician: Josetta Huddle, MD [address] [place], [place] [address]  HISTORICAL INFORMATION:   Selected notes from the MEDICAL RECORD NUMBER Referred by Dr. Elliot Dally for AMD/CME OD;  LEE- 02.13.19 [BCVA OD: 20/70 OS: 20/20] Ocular Hx- pseudophakia OU (1989), POAG OU PMH- myasthenia gravis, hx prostate ca, CHF, A-Fib, COPD    CURRENT MEDICATIONS: Current Outpatient Medications (Ophthalmic Drugs)  Medication Sig  . ALPHAGAN P 0.1 % SOLN INT 1 GTT IN OU BID  . ketorolac (ACULAR) 0.5 % ophthalmic solution Place 1 drop into the right eye 4 (four) times daily.  . prednisoLONE acetate (PRED FORTE) 1 % ophthalmic suspension Place 1 drop into the right eye 4 (four) times daily.   No current facility-administered medications for this visit.  (Ophthalmic Drugs)   Current Outpatient Medications (Other)  Medication Sig  . alendronate (FOSAMAX) 70 MG tablet Take 70 mg by mouth once a week. Take with a full glass of water on an empty stomach.  . Cholecalciferol (VITAMIN D) 2000 units CAPS Take 2,000 Units by mouth daily.  Marland Kitchen doxycycline (VIBRAMYCIN) 100 MG capsule Take 1 capsule by mouth 2 (two) times  daily. Will be finished on 01-15-18  . escitalopram (LEXAPRO) 5 MG tablet Take 5 mg by mouth daily.   Marland Kitchen FIBER PO Take 1 tablet by mouth 3 (three) times daily.   . fluticasone (FLONASE) 50 MCG/ACT nasal spray Place 2 sprays into both nostrils 2 (two) times daily.   . furosemide (LASIX) 40 MG tablet Take 1.5 tablets (60mg  total) by mouth every morning.  Take 1 tablet (40mg  total) by mouth every afternoon.  Marland Kitchen guaiFENesin (MUCINEX) 600 MG 12 hr tablet Take 600 mg by mouth at bedtime as needed for to loosen phlegm.   Marland Kitchen HYDROcodone-acetaminophen (NORCO) 10-325 MG tablet Take 1 tablet by mouth every 6 (six) hours as needed (for pain). (Patient taking differently: Take 0.5 tablets by mouth every 6 (six) hours as needed (for pain). )  . metoprolol tartrate (LOPRESSOR) 25 MG tablet Take 25 mg by mouth 2 (two) times daily.  . naloxegol oxalate (MOVANTIK) 12.5 MG TABS tablet Take 12.5 mg by mouth daily.  Marland Kitchen OVER THE COUNTER MEDICATION Take 600 mg by mouth daily.  . pantoprazole (PROTONIX) 40 MG tablet Take 1 tablet (40 mg total) by mouth daily.  . polyethylene glycol (MIRALAX / GLYCOLAX) packet Take 17 g by mouth daily as needed for moderate constipation. For constipation  . predniSONE (DELTASONE) 10 MG tablet Take 10 mg by mouth daily with  breakfast.   . predniSONE (DELTASONE) 5 MG tablet TAKE 1 AND 1/2 TABLETS(7.5 MG) BY MOUTH DAILY WITH BREAKFAST  . spironolactone (ALDACTONE) 25 MG tablet Take 25 mg by mouth daily.  . vitamin B-12 (CYANOCOBALAMIN) 1000 MCG tablet Take 1,000 mcg by mouth daily with lunch.   . warfarin (COUMADIN) 5 MG tablet TAKE AS DIRECTED BY COUMADIN CLINIC   Current Facility-Administered Medications (Other)  Medication Route  . Bevacizumab (AVASTIN) SOLN 1.25 mg Intravitreal  . Bevacizumab (AVASTIN) SOLN 1.25 mg Intravitreal  . Bevacizumab (AVASTIN) SOLN 1.25 mg Intravitreal  . Bevacizumab (AVASTIN) SOLN 1.25 mg Intravitreal      REVIEW OF SYSTEMS: ROS    Positive for: Eyes    Negative for: Constitutional, Gastrointestinal, Neurological, Skin, Genitourinary, Musculoskeletal, HENT, Endocrine, Cardiovascular, Respiratory, Psychiatric, Allergic/Imm, Heme/Lymph   Last edited by Zenovia Jordan, LPN on 03/02/1659 63:01 AM. (History)       ALLERGIES Allergies  Allergen Reactions  . Tape Other (See Comments)    PATIENT'S SKIN IS VERY THIN AND WILL TEAR AND BRUISE VERY EASILY!! Paper take is ok    PAST MEDICAL HISTORY Past Medical History:  Diagnosis Date  . Atrial fibrillation ([place])   . Cellulitis    left arm  . CHF (congestive heart failure) ([place])   . Dyspnea    oxygen prn  . Dysrhythmia    afib  . GERD (gastroesophageal reflux disease)    ocassional  . History of kidney stones    passed  . Myasthenia gravis   . Osteoarthritis   . Pneumonia    Hstory of  . 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 Prostate cancer ([place]) dx'd 1982   treated with surgery and radiation.  Basal cell cancer- many  . Sepsis ([place])   . Urine incontinence   . Varicose veins of left lower extremity    Past Surgical History:  Procedure Laterality Date  . AMPUTATION Left 03/20/2016   Procedure: Left Great Toe Amputation at Metatarsophalangeal Joint;  Surgeon: Newt Minion, MD;  Location: [facility];  Service: Orthopedics;  Laterality: Left;  . AMPUTATION Left 06/11/2016   Procedure: Left 2nd Toe Amputation;  Surgeon: Newt Minion, MD;  Location: [facility];  Service: Orthopedics;  Laterality: Left;  . AMPUTATION Left 07/12/2016   Procedure: LEFT 3rd TOE AMPUTATION;  Surgeon: Newt Minion, MD;  Location: [facility];  Service: Orthopedics;  Laterality: Left;  . AMPUTATION Left 09/11/2016   Procedure: Left Transmetatarsal Amputation;  Surgeon: Newt Minion, MD;  Location: [facility];  Service: Orthopedics;  Laterality: Left;  . appendectomy    . APPENDECTOMY    . COLONOSCOPY    . HERNIA REPAIR Bilateral    Inguinal  . I&D EXTREMITY Left 10/12/2014   Procedure: IRRIGATION AND  DEBRIDEMENT INDEX FINGER;  Surgeon: Iran Planas, MD;  Location: [facility];  Service: Orthopedics;  Laterality: Left;  . KYPHOPLASTY  2012 ?   T 12- L1  . LAMINECTOMY  2005 ?   lumbar- 4-5  . PROSTATE SURGERY    . ROTATOR CUFF REPAIR Right   . STUMP REVISION Left 12/11/2016   Procedure: Revision Left Transmetatarsal Amputation, Apply Wound VAC;  Surgeon: Newt Minion, MD;  Location: [facility];  Service: Orthopedics;  Laterality: Left;  . TOTAL KNEE ARTHROPLASTY Right     FAMILY HISTORY Family History  Problem Relation Age of Onset  . Heart disease Father   . Heart attack Father   . Hypertension Father   . Heart disease  Brother   . Heart disease Paternal Grandfather   . Stroke Mother   . Parkinsonism Sister   . Heart disease Sister   . Dementia Sister     SOCIAL HISTORY Social History   Tobacco Use  . Smoking status: Never Smoker  . Smokeless tobacco: Never Used  Substance Use Topics  . Alcohol use: Yes    Alcohol/week: 1.2 oz    Types: 2 Cans of beer per week    Comment: occasional beer  . Drug use: No         OPHTHALMIC EXAM:  Base Eye Exam    Visual Acuity (Snellen - Linear)      Right Left   Dist [place] 20/100 -2 20/60 -2   Dist ph [place] 20/60 20/50 -1       Tonometry (Tonopen, 10:20 AM)      Right Left   Pressure 16 21       Pupils      Dark Light Shape React APD   Right 4 3.5 Round Slow None   Left 4 3.5 Round Slow None       Visual Fields (Counting fingers)      Left Right    Full    Restrictions  Partial outer inferior temporal deficiency       Extraocular Movement      Right Left    Full, Ortho Full, Ortho       Neuro/Psych    Oriented x3:  Yes   Mood/Affect:  Normal       Dilation    Both eyes:  1.0% Mydriacyl, 2.5% Phenylephrine @ 10:20 AM        Slit Lamp and Fundus Exam    Slit Lamp Exam      Right Left   Lids/Lashes Ptosis UL, Meibomian gland dysfunction, Telangiectasia Ptosis UL, Meibomian gland dysfunction, Telangiectasia    Conjunctiva/Sclera White and quiet White and quiet   Cornea Arcus, trace Punctate epithelial erosions Arcus, trace Punctate epithelial erosions   Anterior Chamber Deep and quiet Deep and quiet   Iris Round and dilated Round and dilated   Lens Three piece PC IOL in good position Three piece PC IOL in good position   Vitreous Vitreous syneresis, Posterior vitreous detachment Vitreous syneresis, Posterior vitreous detachment       Fundus Exam      Right Left   Disc 2+ Pallor, Hemorrhage at 1200, inf rim thinning Normal, pink rim   C/D Ratio 0.85 0.4   Macula Blunted foveal reflex, Microaneurysms, persistent IRH, tr Cystic changes -- improved from prior Retinal pigment epithelial mottling, No heme or edema, Choroidal nevus along inferior arcade, Flat   Vessels Vascular attenuation Vascular attenuation   Periphery Attached, scattered 360 DBH mostly inferiorly, scattered MAs Attached, flat / pigmented Choroidal nevus along distal IT arcade        Refraction    Wearing Rx      Sphere Cylinder Axis Add   Right -1.00 +1.25 026 +2.75   Left -1.00 +2.00 156 +2.75   Type:  PAL       Wearing Rx #2      Sphere Cylinder Axis Add   Right -1.00 +1.25 026 +2.75   Left -1.00 +2.00 156 +2.75   Type:  PAL       Wearing Rx Comments   Didn't bring glasses       Manifest Refraction   Attempted-difficult          IMAGING AND  PROCEDURES  Imaging and Procedures for 11/03/17  OCT, Retina - OU - Both Eyes       Right Eye Quality was borderline. Central Foveal Thickness: 272. Progression has improved. Findings include intraretinal fluid, normal foveal contour, no SRF (Sub-fovea disruption of ellipsoid; interval improvement in IRF, persistent cystic changes).   Left Eye Quality was borderline. Central Foveal Thickness: 269. Progression has been stable. Findings include normal foveal contour, no IRF, no SRF.   Notes *Images captured and stored on drive  Diagnosis / Impression:  OD:  improved IRF; persistent cystic changes OS: NFP, No IRF/SRF  Clinical management:  See below  Abbreviations: NFP - Normal foveal profile. CME - cystoid macular edema. PED - pigment epithelial detachment. IRF - intraretinal fluid. SRF - subretinal fluid. EZ - ellipsoid zone. ERM - epiretinal membrane. ORA - outer retinal atrophy. ORT - outer retinal tubulation. SRHM - subretinal hyper-reflective material                  ASSESSMENT/PLAN:    ICD-10-CM   1. Branch retinal vein occlusion of right eye with macular edema H34.8310 OCT, Retina - OU - Both Eyes  2. Retinal edema H35.81 OCT, Retina - OU - Both Eyes  3. Hypertensive retinopathy of both eyes H35.033   4. 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 Posterior vitreous detachment of both eyes H43.813   6. Pseudophakia of both eyes Z96.1   7. CME (cystoid macular edema), right H35.351     1,2. BRVO w/ macular edema OD - FA from initial visit (2.19.19) shows slightly delayed filling time OD; late leakage corresponding to CME, and inferior hemispheric Mas -- suggestive of mild/remote HRVO - BCVA also dropped from 20/80 to 20/100 - S/P IVA OD #1 (03.11.19), #2 (04.08.19), #3 (05.22.19), #4 (05.22.19), #5 (06.24.19) - OCT with interval improvement in CME, mild persistent cystic changes OD - BCVA improved today to 20/60 OD - pt wishes to hold off on injection today -- reasonable - Vision and visual potential OD complicated by #4 below - F/U 4 weeks for repeat DFE/OCT, possible injection; sooner prn  3. Hypertensive retinopathy OU - discussed importance of tight BP control - monitor  4. Optic neuropathy OD - stable - significant cupping and pallor of disc OD - +rAPD OD - suspect glaucomatous damage vs ischemic optic neuropathy - MRI recommended by Dr. Katy Fitch, but pt has deferred - pt using Alphagan P OU BID per Dr. Katy Fitch - monitor  5. PVD / vitreous syneresis OU  Discussed findings and prognosis  No RT or RD on 360 peripheral  exam  Reviewed s/s of RT/RD  Strict return precautions for any such RT/RD signs/symptoms  6. Pseudophakia OU  - s/p CE/IOL  - beautiful surgery, doing well  - monitor    Ophthalmic Meds Ordered this visit:  No orders of the defined types were placed in this encounter.      Return in about 1 month (around 03/30/2018) for F/U BRVO OD, DFE, OCT.  There are no Patient Instructions on file for this visit.   Explained the diagnoses, plan, and follow up with the patient and they expressed understanding.  Patient expressed understanding of the importance of proper follow up care.   This document serves as a record of services personally performed by Gardiner Sleeper, MD, PhD. It was created on their behalf by Catha Brow, [place], a certified ophthalmic assistant. The creation of this record is the provider's dictation and/or activities during the visit.  Electronically signed by: Catha Brow, COA  08.01.19 12:13 PM    Gardiner Sleeper, M.D., Ph.D. Diseases & Surgery of the Retina and Vitreous [facility]   I have reviewed the above documentation for accuracy and completeness, and I agree with the above. Gardiner Sleeper, M.D., Ph.D. 03/02/18 12:13 PM   Abbreviations: M myopia (nearsighted); A astigmatism; H hyperopia (farsighted); P presbyopia; Mrx spectacle prescription;  CTL contact lenses; OD right eye; OS left eye; OU both eyes  XT exotropia; ET esotropia; PEK punctate epithelial keratitis; PEE punctate epithelial erosions; DES dry eye syndrome; MGD meibomian gland dysfunction; ATs artificial tears; PFAT's preservative free artificial tears; Muddy nuclear sclerotic cataract; PSC posterior subcapsular cataract; ERM epi-retinal membrane; PVD posterior vitreous detachment; RD retinal detachment; DM diabetes mellitus; DR diabetic retinopathy; NPDR non-proliferative diabetic retinopathy; PDR proliferative diabetic retinopathy; CSME clinically significant macular  edema; DME diabetic macular edema; dbh dot blot hemorrhages; CWS cotton wool spot; POAG primary open angle glaucoma; C/D cup-to-disc ratio; HVF humphrey visual field; GVF goldmann visual field; OCT optical coherence tomography; IOP intraocular pressure; BRVO Branch retinal vein occlusion; CRVO central retinal vein occlusion; CRAO central retinal artery occlusion; BRAO branch retinal artery occlusion; RT retinal tear; SB scleral buckle; PPV pars plana vitrectomy; VH Vitreous hemorrhage; PRP panretinal laser photocoagulation; IVK intravitreal kenalog; VMT vitreomacular traction; MH Macular hole;  NVD neovascularization of the disc; NVE neovascularization elsewhere; AREDS age related eye disease study; ARMD age related macular degeneration; POAG primary open angle glaucoma; EBMD epithelial/anterior basement membrane dystrophy; ACIOL anterior chamber intraocular lens; IOL intraocular lens; PCIOL posterior chamber intraocular lens; Phaco/IOL phacoemulsification with intraocular lens placement; [place] photorefractive keratectomy; LASIK laser assisted in situ keratomileusis; HTN hypertension; DM diabetes mellitus; COPD chronic obstructive pulmonary disease

## 2018-03-02 ENCOUNTER — Encounter (INDEPENDENT_AMBULATORY_CARE_PROVIDER_SITE_OTHER): Payer: Self-pay | Admitting: Ophthalmology

## 2018-03-02 ENCOUNTER — Ambulatory Visit (INDEPENDENT_AMBULATORY_CARE_PROVIDER_SITE_OTHER): Payer: Medicare Other | Admitting: Ophthalmology

## 2018-03-02 DIAGNOSIS — H35033 Hypertensive retinopathy, bilateral: Secondary | ICD-10-CM

## 2018-03-02 DIAGNOSIS — H3581 Retinal edema: Secondary | ICD-10-CM | POA: Diagnosis not present

## 2018-03-02 DIAGNOSIS — H34831 Tributary (branch) retinal vein occlusion, right eye, with macular edema: Secondary | ICD-10-CM

## 2018-03-02 DIAGNOSIS — H469 Unspecified optic neuritis: Secondary | ICD-10-CM

## 2018-03-02 DIAGNOSIS — H43813 Vitreous degeneration, bilateral: Secondary | ICD-10-CM

## 2018-03-02 DIAGNOSIS — Z961 Presence of intraocular lens: Secondary | ICD-10-CM

## 2018-03-02 DIAGNOSIS — H35351 Cystoid macular degeneration, right eye: Secondary | ICD-10-CM

## 2018-03-13 ENCOUNTER — Ambulatory Visit: Payer: Medicare Other | Admitting: *Deleted

## 2018-03-13 DIAGNOSIS — Z7901 Long term (current) use of anticoagulants: Secondary | ICD-10-CM | POA: Diagnosis not present

## 2018-03-13 DIAGNOSIS — I482 Chronic atrial fibrillation, unspecified: Secondary | ICD-10-CM

## 2018-03-13 DIAGNOSIS — Z5181 Encounter for therapeutic drug level monitoring: Secondary | ICD-10-CM

## 2018-03-13 LAB — POCT INR: INR: 2.1 (ref 2.0–3.0)

## 2018-03-13 NOTE — Patient Instructions (Signed)
Description   Continue on same dose 1/2 tablet daily except 1 tablet on Mondays and Fridays. Call if on any new medications or scheduled for any procedures [phone] [phone]. Recheck INR in 6 weeks.

## 2018-03-15 ENCOUNTER — Other Ambulatory Visit: Payer: Self-pay | Admitting: Neurology

## 2018-03-16 ENCOUNTER — Telehealth: Payer: Self-pay | Admitting: Neurology

## 2018-03-16 ENCOUNTER — Ambulatory Visit (INDEPENDENT_AMBULATORY_CARE_PROVIDER_SITE_OTHER): Payer: Medicare Other | Admitting: Pharmacist

## 2018-03-16 DIAGNOSIS — Z5181 Encounter for therapeutic drug level monitoring: Secondary | ICD-10-CM

## 2018-03-16 DIAGNOSIS — Z7901 Long term (current) use of anticoagulants: Secondary | ICD-10-CM | POA: Diagnosis not present

## 2018-03-16 DIAGNOSIS — I482 Chronic atrial fibrillation, unspecified: Secondary | ICD-10-CM

## 2018-03-16 LAB — POCT INR: INR: 2.5 (ref 2.0–3.0)

## 2018-03-16 NOTE — Patient Instructions (Signed)
Continue on same dose 1/2 tablet daily except 1 tablet on Mondays and Fridays. Call if on any new medications or scheduled for any procedures [phone] [phone]. Recheck INR in 6 weeks

## 2018-03-16 NOTE — Telephone Encounter (Signed)
I did finally talk to the patient.  I discussed the MRI results with him, we will need to monitor the gait disorder, if this worsens, we will consider lumbar puncture.  The patient is on Coumadin unfortunately, he will need to come off this if we do proceed with lumbar puncture.

## 2018-04-02 ENCOUNTER — Encounter (INDEPENDENT_AMBULATORY_CARE_PROVIDER_SITE_OTHER): Payer: Medicare Other | Admitting: Ophthalmology

## 2018-04-22 ENCOUNTER — Encounter (INDEPENDENT_AMBULATORY_CARE_PROVIDER_SITE_OTHER): Payer: Medicare Other | Admitting: Ophthalmology

## 2018-05-05 ENCOUNTER — Ambulatory Visit
Admission: RE | Admit: 2018-05-05 | Discharge: 2018-05-05 | Disposition: A | Discharge status: Home / Self Care | Payer: Medicare Other | Source: Ambulatory Visit | Attending: Internal Medicine | Admitting: Internal Medicine

## 2018-05-05 ENCOUNTER — Other Ambulatory Visit: Payer: Self-pay | Admitting: Internal Medicine

## 2018-05-05 DIAGNOSIS — Z111 Encounter for screening for respiratory tuberculosis: Secondary | ICD-10-CM

## 2018-05-15 ENCOUNTER — Telehealth: Payer: Self-pay | Admitting: Neurology

## 2018-05-15 NOTE — Telephone Encounter (Signed)
Events noted

## 2018-05-15 NOTE — Telephone Encounter (Signed)
Pts daughter [name] called stating the pt passed away as of 06/13/23 [facility]

## 2018-05-25 ENCOUNTER — Ambulatory Visit: Payer: Medicare Other | Admitting: Nurse Practitioner

## 2018-05-29 DEATH — deceased

## 2018-07-02 ENCOUNTER — Ambulatory Visit: Payer: Medicare Other | Admitting: Neurology

## 2018-07-22 IMAGING — CR DG CHEST 2V
2 series · 2 of 2 positions shown · non-contrast
Comparison: Yesterday

CLINICAL DATA: Sepsis

EXAM:
CHEST  2 VIEW

[chest lat]
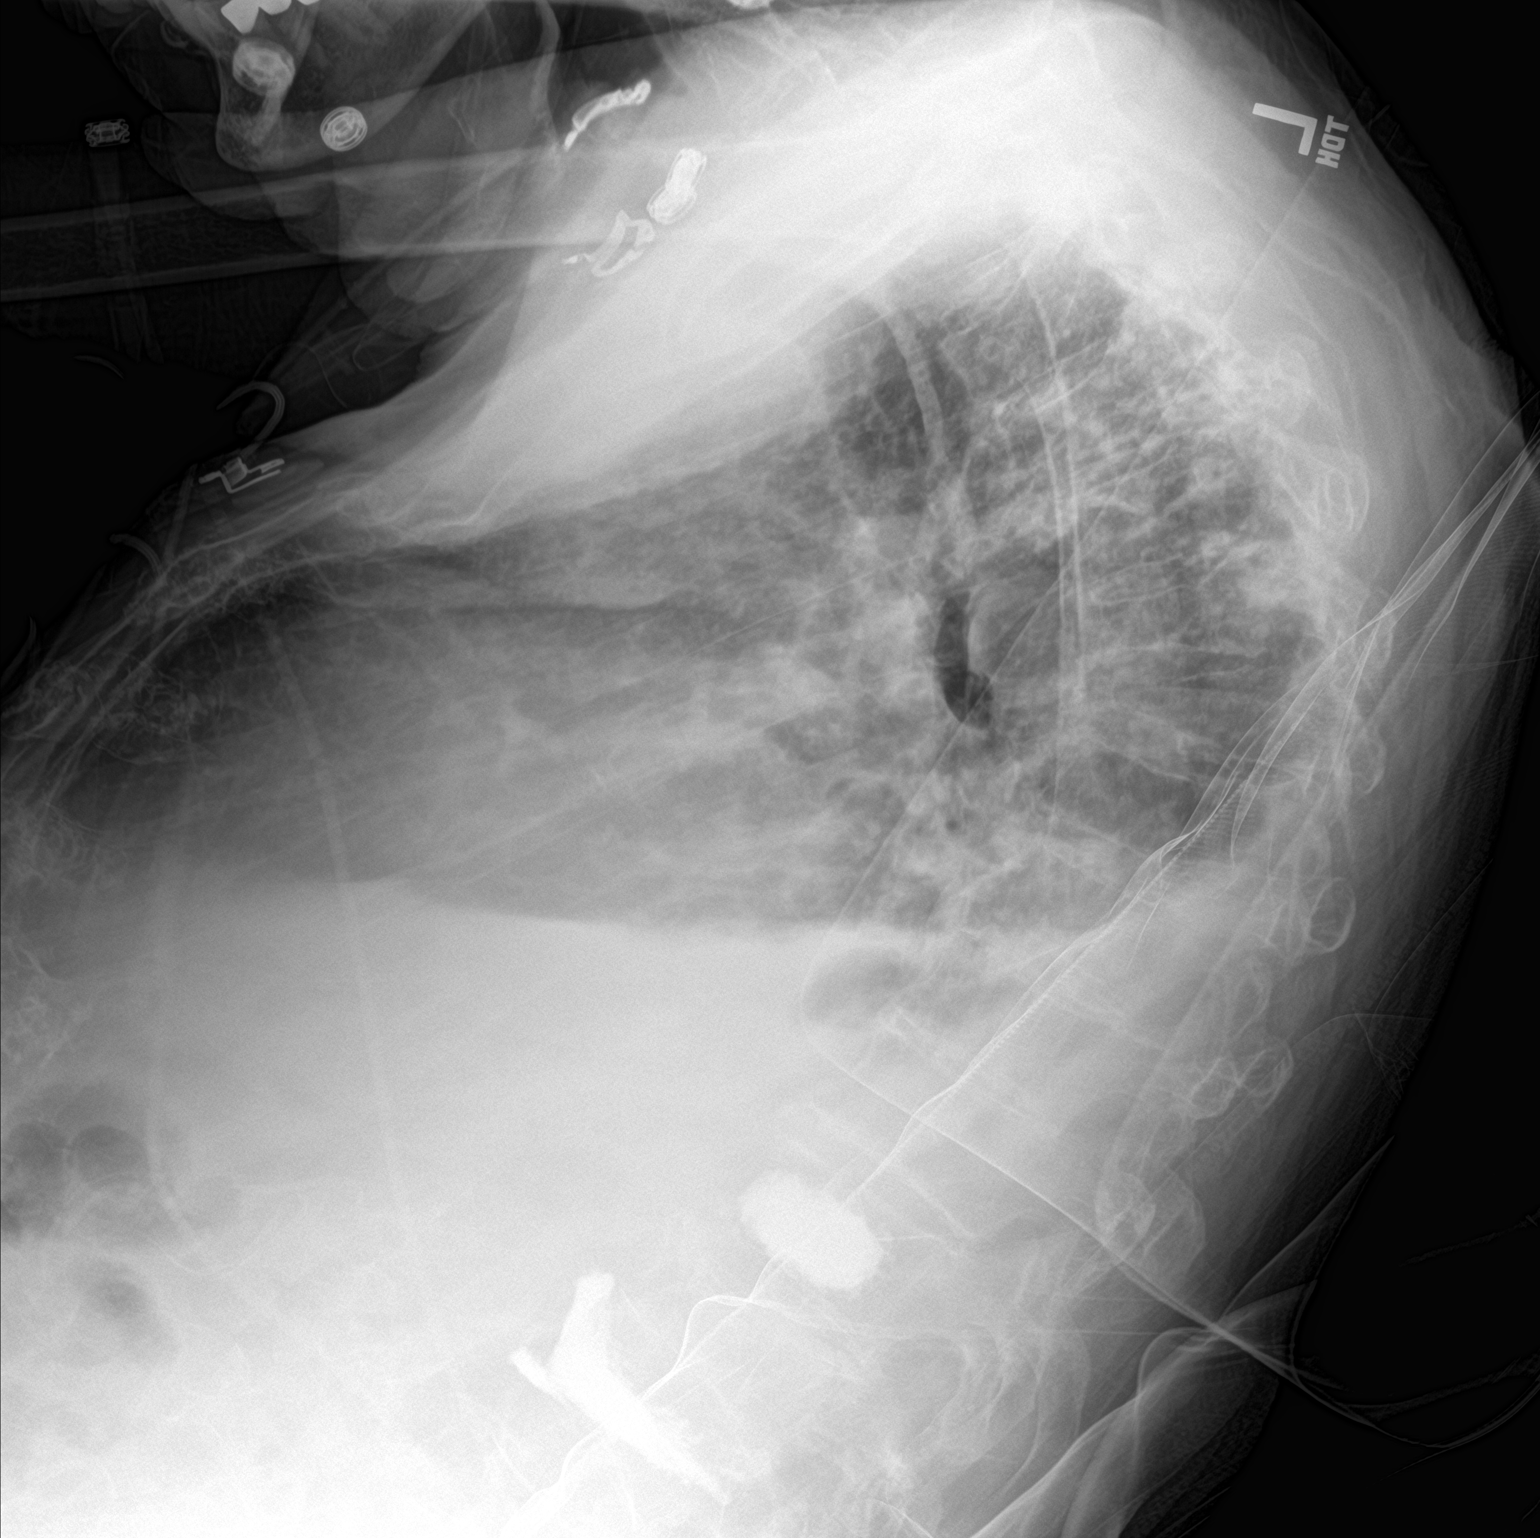

[chest ap]
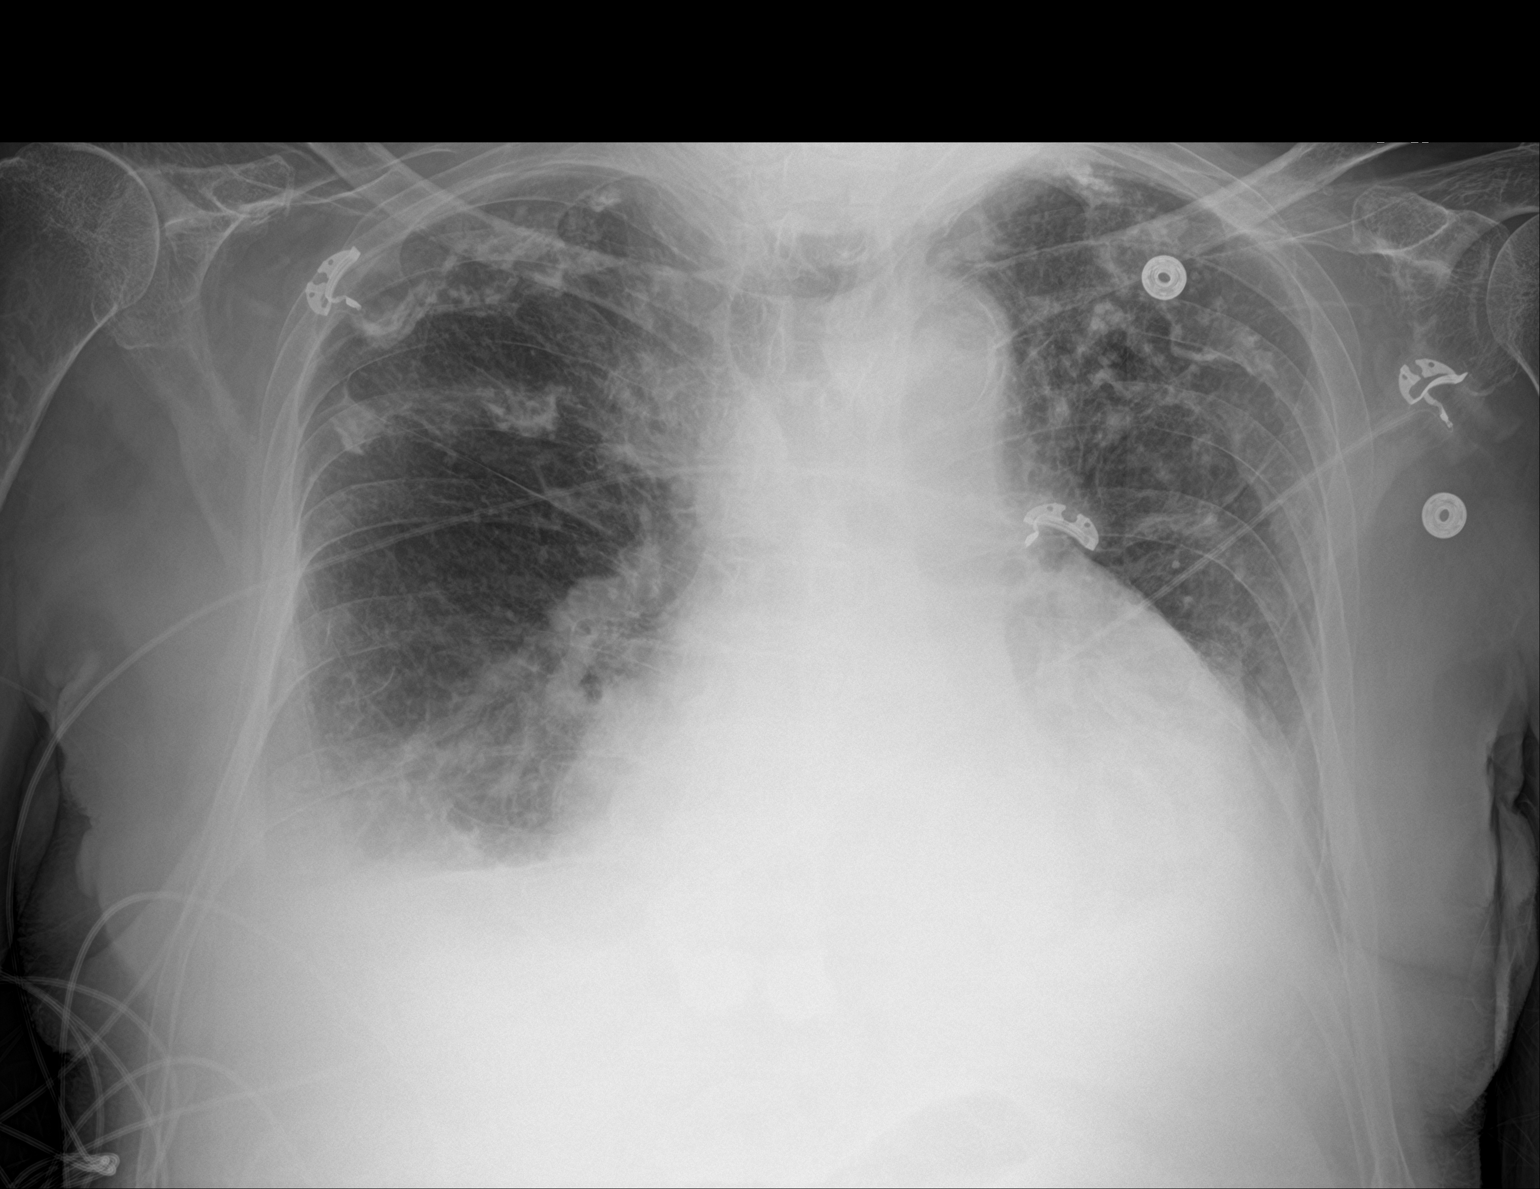

[2 of 2 positions shown; findings below may reference images not displayed]

FINDINGS: Cardiomegaly is stable. Bibasilar hazy opacity and small pleural
effusions have increased. Calcifications in the upper lung zones are
stable. Stable mid-level thoracic compression deformity.
IMPRESSION: Increasing bibasilar hazy airspace disease and small pleural
effusions.

## 2019-05-06 IMAGING — DX DG TIBIA/FIBULA 2V*R*
4 series · 4 of 4 positions shown · non-contrast
Comparison: None.

CLINICAL DATA: Cellulitis

EXAM:
RIGHT TIBIA AND FIBULA - 2 VIEW

[tibia ap (1 of 2)]
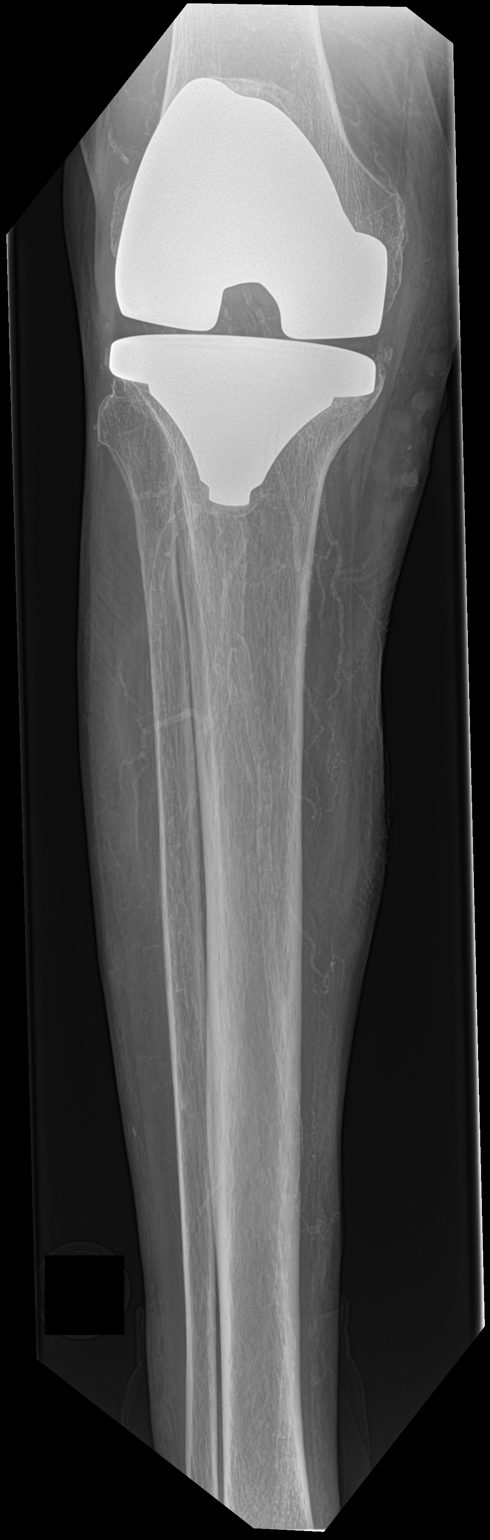

[tibia ap (2 of 2)]
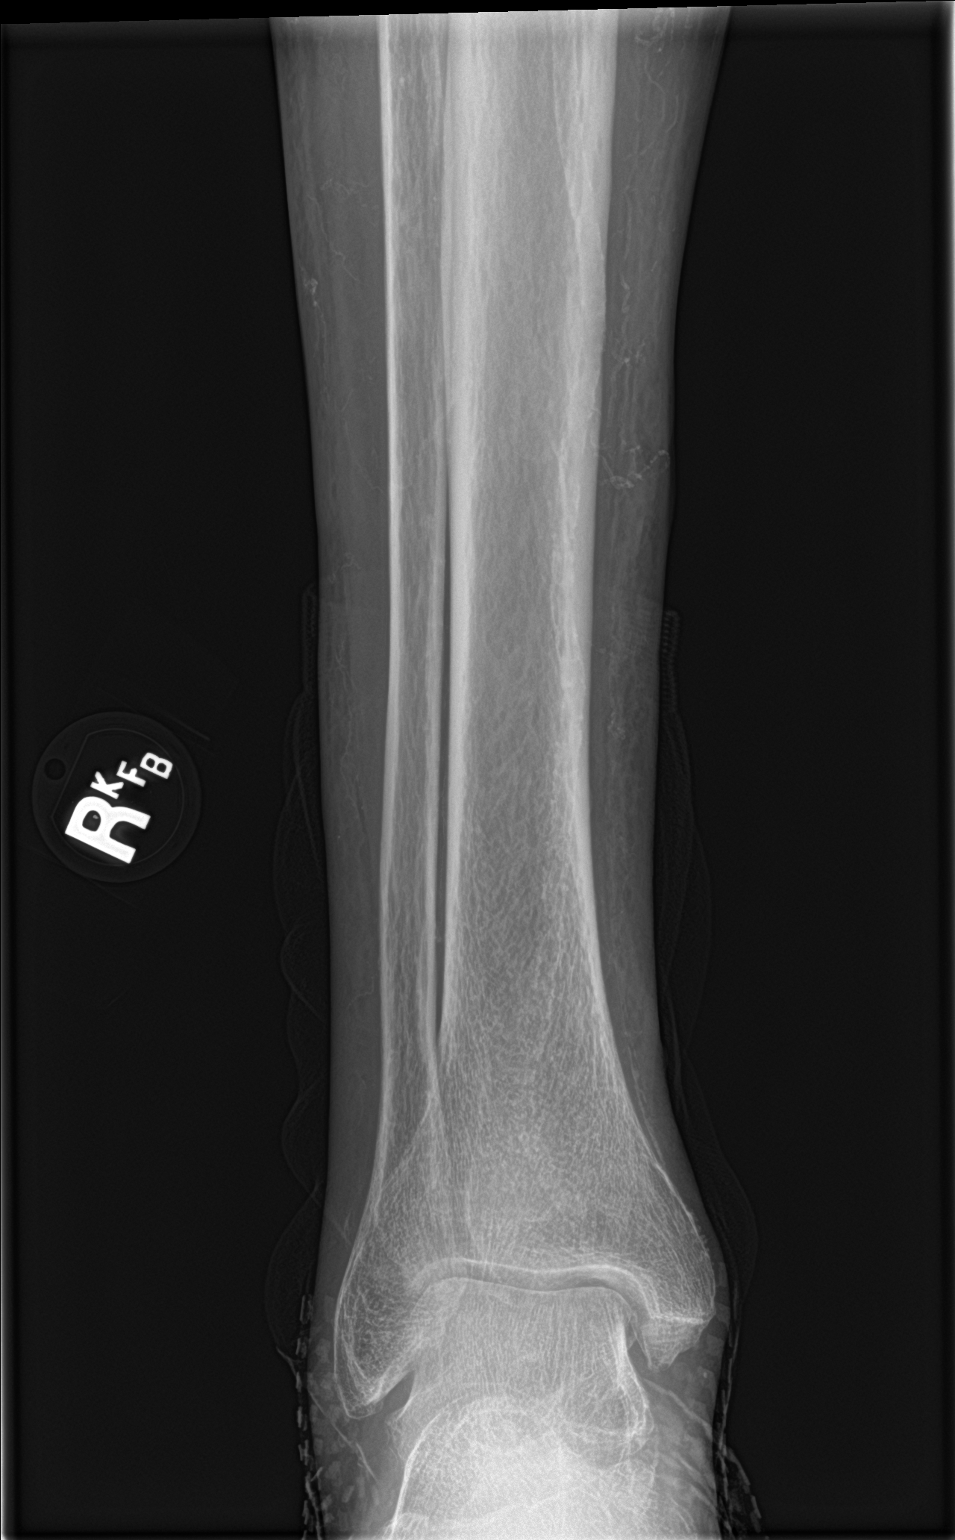

[tibia lat (1 of 2)]
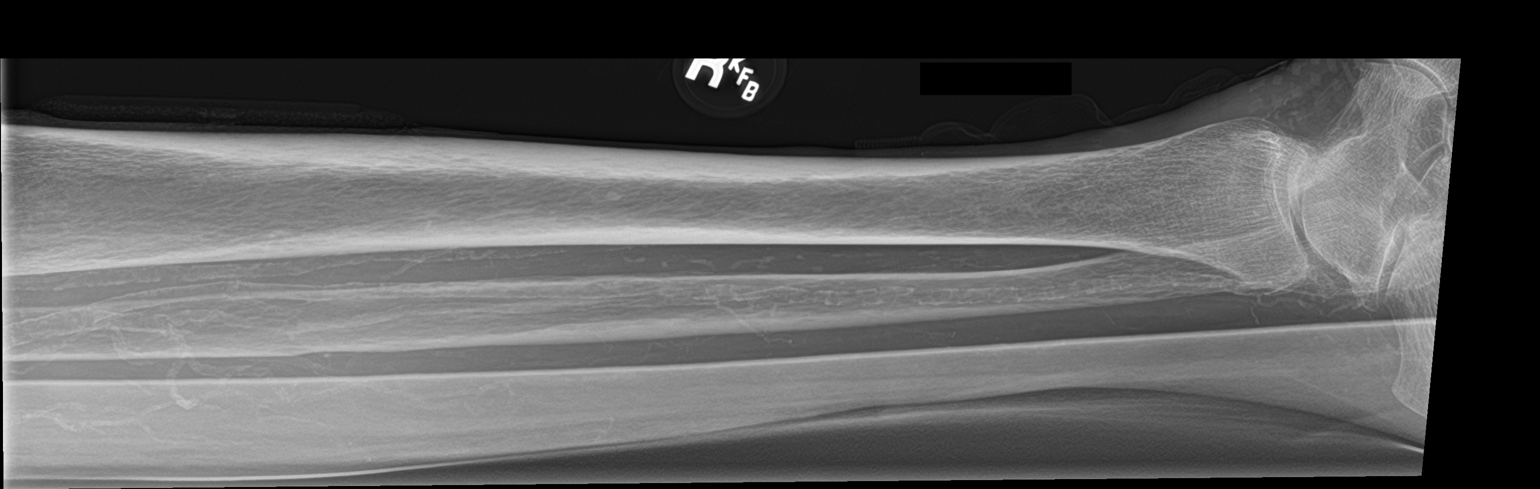

[tibia lat (2 of 2)]
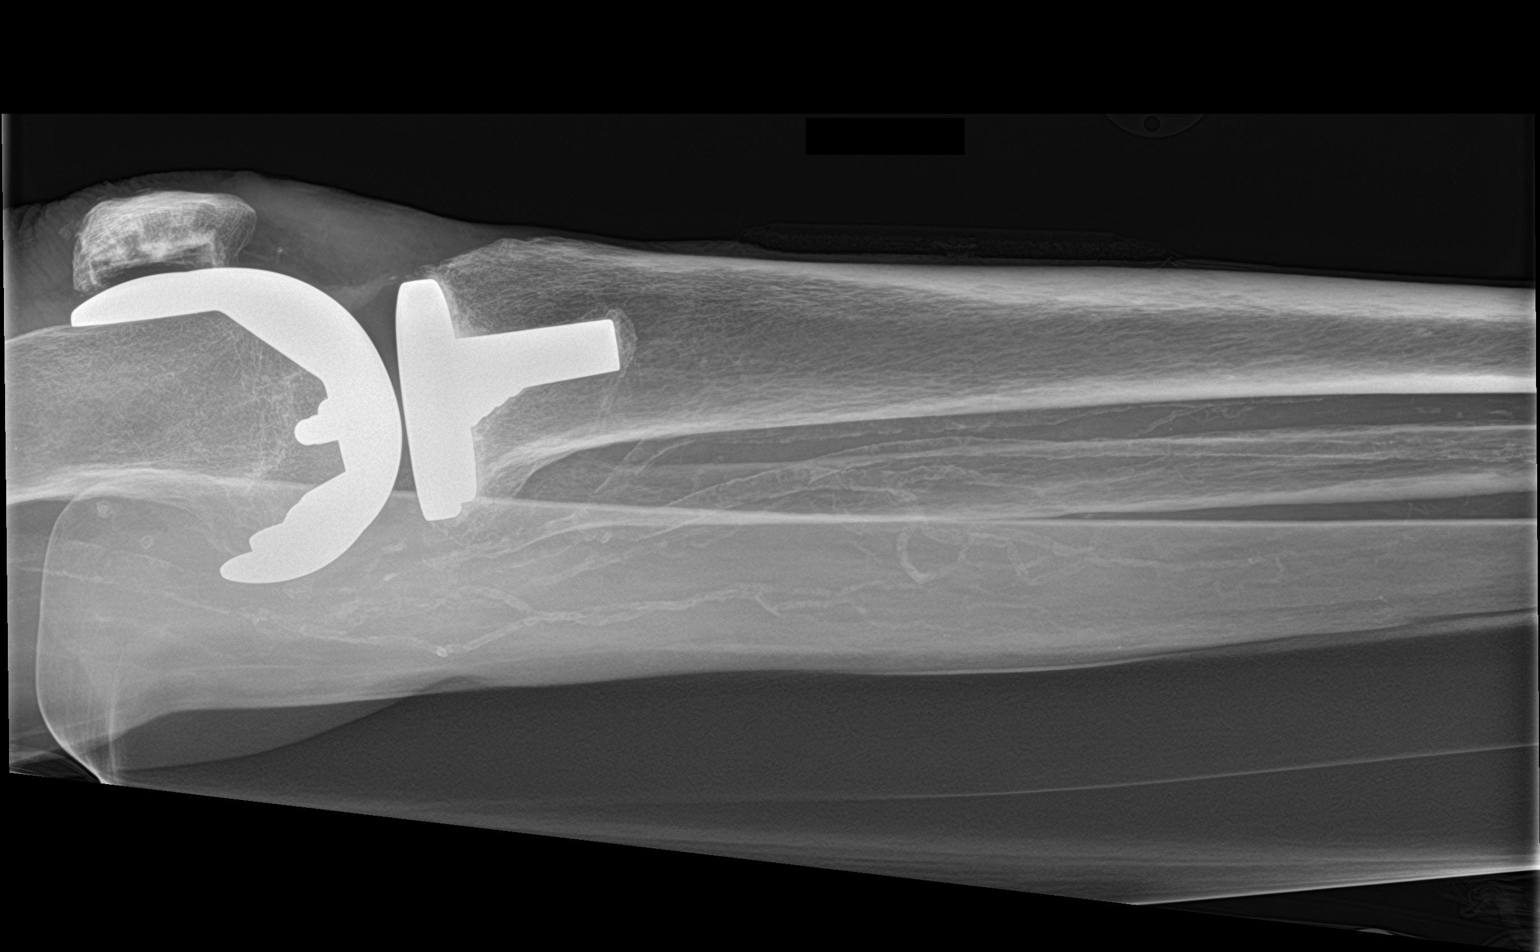

[4 of 4 positions shown; findings below may reference images not displayed]

FINDINGS: Right knee replacement is identified. No acute fracture or
dislocation is seen. Diffuse vascular calcifications are noted.
IMPRESSION: No acute abnormality noted.
